# Patient Record
Sex: Female | Born: 1935 | Race: White | Hispanic: No | Smoking: Never smoker
Health system: Southern US, Community
[De-identification: ages and names within clinical notes are randomized; demographics above are authoritative.]

## PROBLEM LIST (undated history)

## (undated) DIAGNOSIS — E119 Type 2 diabetes mellitus without complications: Secondary | ICD-10-CM

## (undated) DIAGNOSIS — E079 Disorder of thyroid, unspecified: Secondary | ICD-10-CM

## (undated) DIAGNOSIS — I4891 Unspecified atrial fibrillation: Secondary | ICD-10-CM

## (undated) DIAGNOSIS — I10 Essential (primary) hypertension: Secondary | ICD-10-CM

## (undated) DIAGNOSIS — M1990 Unspecified osteoarthritis, unspecified site: Secondary | ICD-10-CM

## (undated) DIAGNOSIS — K449 Diaphragmatic hernia without obstruction or gangrene: Secondary | ICD-10-CM

## (undated) DIAGNOSIS — K219 Gastro-esophageal reflux disease without esophagitis: Secondary | ICD-10-CM

## (undated) DIAGNOSIS — I509 Heart failure, unspecified: Secondary | ICD-10-CM

## (undated) DIAGNOSIS — C4491 Basal cell carcinoma of skin, unspecified: Secondary | ICD-10-CM

## (undated) DIAGNOSIS — G629 Polyneuropathy, unspecified: Secondary | ICD-10-CM

## (undated) HISTORY — DX: Essential (primary) hypertension: I10

## (undated) HISTORY — DX: Gastro-esophageal reflux disease without esophagitis: K21.9

## (undated) HISTORY — DX: Heart failure, unspecified: I50.9

## (undated) HISTORY — PX: APPENDECTOMY: SHX54

## (undated) HISTORY — DX: Unspecified osteoarthritis, unspecified site: M19.90

## (undated) HISTORY — DX: Type 2 diabetes mellitus without complications: E11.9

## (undated) HISTORY — PX: EYE SURGERY: SHX253

## (undated) HISTORY — DX: Unspecified atrial fibrillation: I48.91

## (undated) HISTORY — PX: ABDOMINAL HYSTERECTOMY: SHX81

## (undated) HISTORY — DX: Diaphragmatic hernia without obstruction or gangrene: K44.9

## (undated) HISTORY — PX: OTHER SURGICAL HISTORY: SHX169

## (undated) HISTORY — PX: BREAST SURGERY: SHX581

## (undated) HISTORY — PX: KNEE SURGERY: SHX244

## (undated) HISTORY — DX: Disorder of thyroid, unspecified: E07.9

---

## 1998-04-08 ENCOUNTER — Ambulatory Visit (HOSPITAL_COMMUNITY): Admission: RE | Admit: 1998-04-08 | Discharge: 1998-04-08 | Payer: Self-pay | Admitting: Cardiovascular Disease

## 2004-11-14 ENCOUNTER — Inpatient Hospital Stay: Payer: Self-pay | Admitting: Internal Medicine

## 2004-11-24 ENCOUNTER — Ambulatory Visit: Payer: Self-pay | Admitting: Internal Medicine

## 2004-11-25 ENCOUNTER — Ambulatory Visit: Payer: Self-pay | Admitting: Internal Medicine

## 2004-12-03 ENCOUNTER — Ambulatory Visit: Payer: Self-pay | Admitting: Internal Medicine

## 2004-12-08 ENCOUNTER — Ambulatory Visit: Payer: Self-pay | Admitting: Internal Medicine

## 2004-12-10 ENCOUNTER — Ambulatory Visit: Payer: Self-pay | Admitting: Internal Medicine

## 2005-04-28 ENCOUNTER — Ambulatory Visit: Payer: Self-pay | Admitting: Family Medicine

## 2005-07-15 ENCOUNTER — Ambulatory Visit: Payer: Self-pay | Admitting: Family Medicine

## 2005-08-07 ENCOUNTER — Ambulatory Visit: Payer: Self-pay | Admitting: Otolaryngology

## 2005-08-24 ENCOUNTER — Ambulatory Visit: Payer: Self-pay | Admitting: Otolaryngology

## 2005-10-28 ENCOUNTER — Ambulatory Visit: Payer: Self-pay | Admitting: Otolaryngology

## 2005-11-25 ENCOUNTER — Ambulatory Visit: Payer: Self-pay | Admitting: Otolaryngology

## 2006-01-19 IMAGING — CR DG CHEST 1V PORT
1 series · 1 of 1 positions shown · non-contrast
Comparison: none

REASON FOR EXAM: R/O pneumonia
COMMENTS:

[view not recorded]
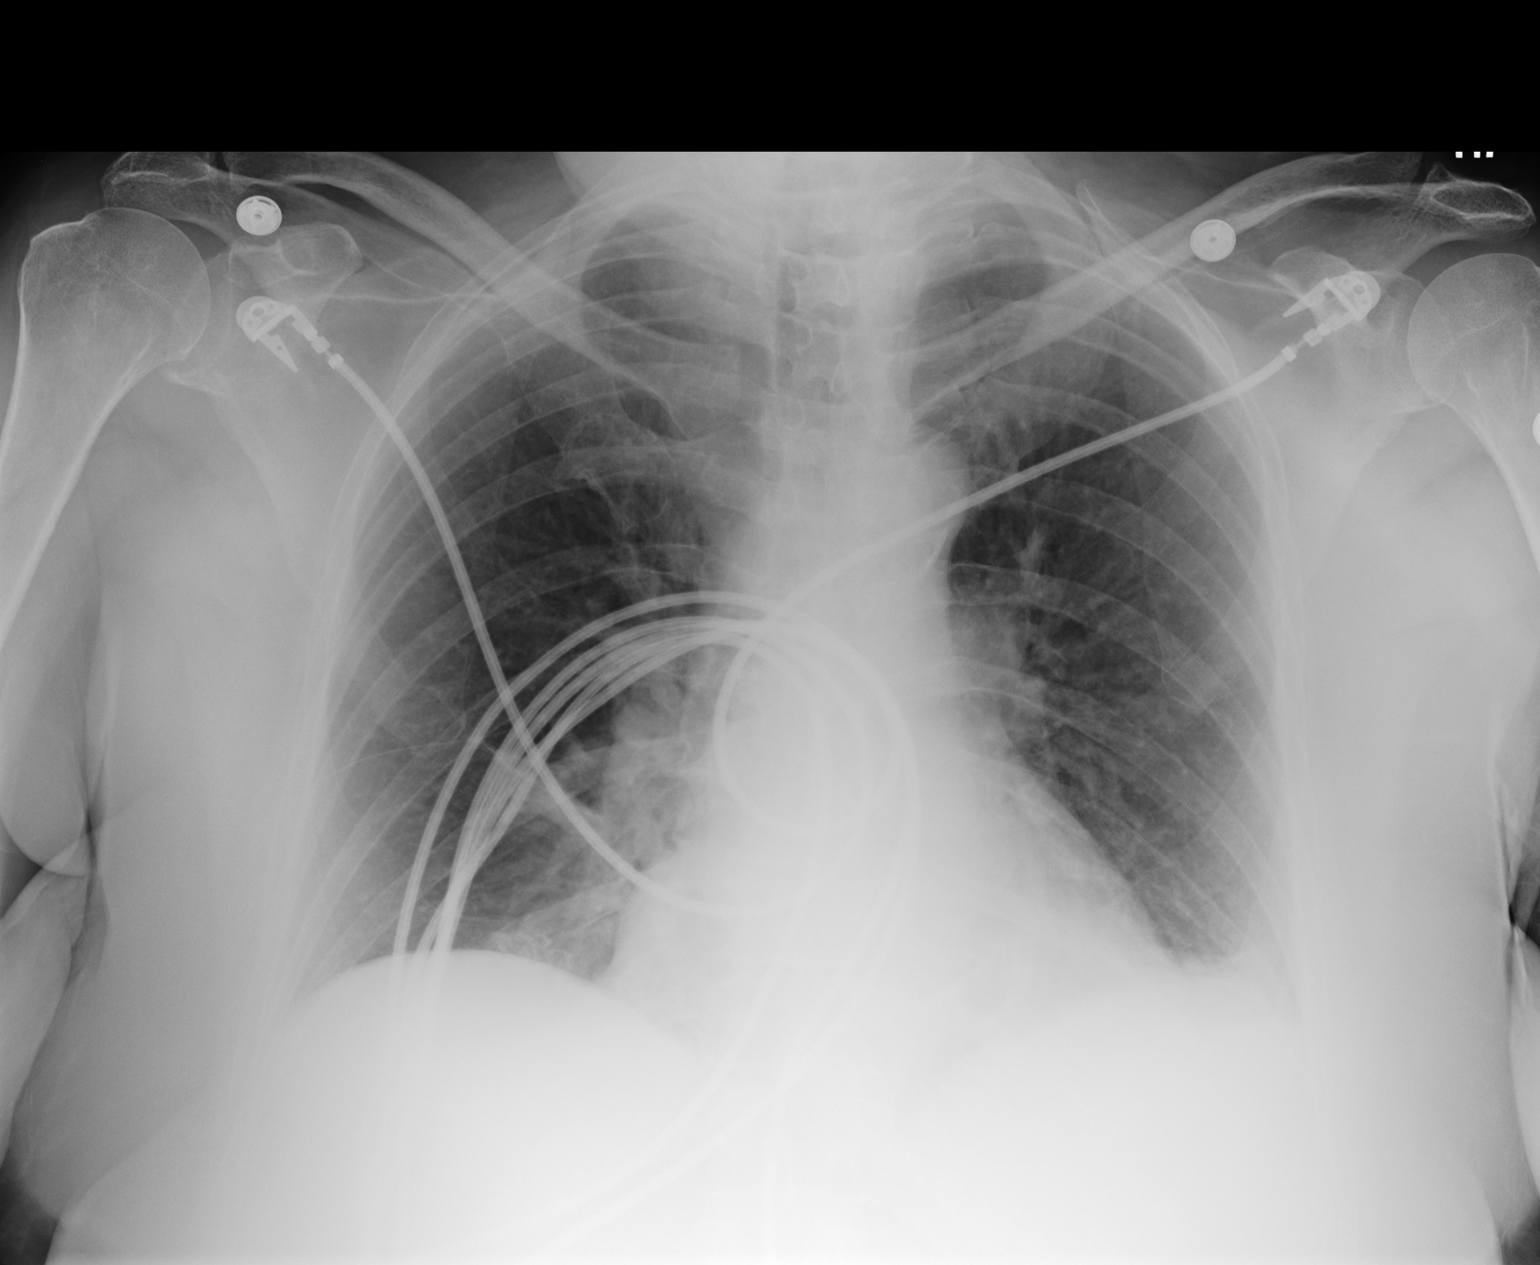

[1 of 1 positions shown; findings below may reference images not displayed]

PROCEDURE:     DXR - DXR PORTABLE CHEST SINGLE VIEW  - November 19, 2004  [DATE]

RESULT:       Portable chest shows underlying COPD with interstitial
changes.  When compared to the prior study from 11/15/04 there has been
development of a LEFT pleural effusion. I would not be surprised if there is
associated atelectasis.  The trachea is midline.  The bony structures show
degenerative change.
IMPRESSION: 1.     COPD with interstitial changes.
2.     No clear evidence of a pneumonia.
3.     New since the prior study of 11/15/04 has been development of a LEFT
pleural effusion.  I suspect there is associated atelectasis.
4.     Extensive degenerative bony changes.

## 2006-04-04 ENCOUNTER — Inpatient Hospital Stay: Payer: Self-pay | Admitting: Cardiovascular Disease

## 2006-04-04 ENCOUNTER — Other Ambulatory Visit: Payer: Self-pay

## 2006-05-04 ENCOUNTER — Ambulatory Visit: Payer: Self-pay | Admitting: Unknown Physician Specialty

## 2006-05-04 LAB — HM COLONOSCOPY

## 2006-06-03 ENCOUNTER — Ambulatory Visit: Payer: Self-pay | Admitting: Family Medicine

## 2006-12-14 ENCOUNTER — Ambulatory Visit: Payer: Self-pay | Admitting: Ophthalmology

## 2006-12-21 ENCOUNTER — Ambulatory Visit: Payer: Self-pay | Admitting: Ophthalmology

## 2007-03-26 ENCOUNTER — Other Ambulatory Visit: Payer: Self-pay

## 2007-03-26 ENCOUNTER — Inpatient Hospital Stay: Payer: Self-pay | Admitting: Internal Medicine

## 2007-03-29 ENCOUNTER — Other Ambulatory Visit: Payer: Self-pay

## 2007-03-31 ENCOUNTER — Other Ambulatory Visit: Payer: Self-pay

## 2007-04-01 ENCOUNTER — Other Ambulatory Visit: Payer: Self-pay

## 2007-06-14 ENCOUNTER — Ambulatory Visit: Payer: Self-pay | Admitting: Family Medicine

## 2007-06-29 ENCOUNTER — Other Ambulatory Visit: Payer: Self-pay

## 2007-06-29 ENCOUNTER — Inpatient Hospital Stay: Payer: Self-pay | Admitting: Internal Medicine

## 2007-06-30 ENCOUNTER — Other Ambulatory Visit: Payer: Self-pay

## 2007-07-01 ENCOUNTER — Other Ambulatory Visit: Payer: Self-pay

## 2007-12-15 ENCOUNTER — Ambulatory Visit: Payer: Self-pay | Admitting: Family Medicine

## 2007-12-28 ENCOUNTER — Ambulatory Visit: Payer: Self-pay | Admitting: Family Medicine

## 2007-12-30 ENCOUNTER — Inpatient Hospital Stay: Payer: Self-pay | Admitting: Internal Medicine

## 2007-12-30 ENCOUNTER — Other Ambulatory Visit: Payer: Self-pay

## 2008-06-19 ENCOUNTER — Ambulatory Visit: Payer: Self-pay | Admitting: Family Medicine

## 2009-03-04 ENCOUNTER — Ambulatory Visit: Payer: Self-pay | Admitting: Family Medicine

## 2009-03-04 ENCOUNTER — Encounter: Payer: Self-pay | Admitting: Gastroenterology

## 2009-03-13 ENCOUNTER — Encounter: Payer: Self-pay | Admitting: Gastroenterology

## 2009-03-13 ENCOUNTER — Ambulatory Visit: Payer: Self-pay | Admitting: Family Medicine

## 2009-03-14 ENCOUNTER — Ambulatory Visit: Payer: Self-pay | Admitting: Family Medicine

## 2009-03-14 ENCOUNTER — Encounter: Payer: Self-pay | Admitting: Gastroenterology

## 2009-03-20 ENCOUNTER — Encounter: Payer: Self-pay | Admitting: Gastroenterology

## 2009-03-27 ENCOUNTER — Encounter: Payer: Self-pay | Admitting: Gastroenterology

## 2009-04-18 ENCOUNTER — Ambulatory Visit: Payer: Self-pay | Admitting: Unknown Physician Specialty

## 2009-04-24 ENCOUNTER — Telehealth (INDEPENDENT_AMBULATORY_CARE_PROVIDER_SITE_OTHER): Payer: Self-pay | Admitting: *Deleted

## 2009-04-24 ENCOUNTER — Encounter: Payer: Self-pay | Admitting: Gastroenterology

## 2009-04-24 DIAGNOSIS — R933 Abnormal findings on diagnostic imaging of other parts of digestive tract: Secondary | ICD-10-CM | POA: Insufficient documentation

## 2009-05-04 ENCOUNTER — Inpatient Hospital Stay: Payer: Self-pay | Admitting: Internal Medicine

## 2009-05-04 ENCOUNTER — Encounter: Payer: Self-pay | Admitting: Gastroenterology

## 2009-05-05 ENCOUNTER — Encounter: Payer: Self-pay | Admitting: Gastroenterology

## 2009-05-08 ENCOUNTER — Encounter: Payer: Self-pay | Admitting: Gastroenterology

## 2009-05-08 ENCOUNTER — Telehealth: Payer: Self-pay | Admitting: Gastroenterology

## 2009-05-09 ENCOUNTER — Encounter: Payer: Self-pay | Admitting: Gastroenterology

## 2009-05-10 ENCOUNTER — Ambulatory Visit: Payer: Self-pay | Admitting: Oncology

## 2009-05-16 ENCOUNTER — Encounter: Payer: Self-pay | Admitting: Gastroenterology

## 2009-05-16 ENCOUNTER — Telehealth (INDEPENDENT_AMBULATORY_CARE_PROVIDER_SITE_OTHER): Payer: Self-pay | Admitting: *Deleted

## 2009-05-16 DIAGNOSIS — R1900 Intra-abdominal and pelvic swelling, mass and lump, unspecified site: Secondary | ICD-10-CM | POA: Insufficient documentation

## 2009-05-30 ENCOUNTER — Ambulatory Visit (HOSPITAL_COMMUNITY): Admission: RE | Admit: 2009-05-30 | Discharge: 2009-05-30 | Payer: Self-pay | Admitting: Gastroenterology

## 2009-05-30 ENCOUNTER — Encounter: Payer: Self-pay | Admitting: Gastroenterology

## 2009-05-30 ENCOUNTER — Ambulatory Visit: Payer: Self-pay | Admitting: Gastroenterology

## 2009-06-05 ENCOUNTER — Ambulatory Visit: Payer: Self-pay | Admitting: Oncology

## 2009-06-10 ENCOUNTER — Ambulatory Visit: Payer: Self-pay | Admitting: Oncology

## 2009-06-17 ENCOUNTER — Ambulatory Visit: Payer: Self-pay | Admitting: General Surgery

## 2009-06-19 ENCOUNTER — Encounter: Payer: Self-pay | Admitting: Gastroenterology

## 2009-06-19 ENCOUNTER — Inpatient Hospital Stay: Payer: Self-pay | Admitting: General Surgery

## 2009-06-19 HISTORY — PX: REMOVAL OF GASTROINTESTINAL STOMATIC  TUMOR OF STOMACH: SHX6339

## 2009-07-10 ENCOUNTER — Ambulatory Visit: Payer: Self-pay | Admitting: Oncology

## 2009-07-24 ENCOUNTER — Emergency Department: Payer: Self-pay | Admitting: Unknown Physician Specialty

## 2009-08-10 ENCOUNTER — Ambulatory Visit: Payer: Self-pay | Admitting: Oncology

## 2009-09-04 ENCOUNTER — Ambulatory Visit: Payer: Self-pay | Admitting: Oncology

## 2009-09-10 ENCOUNTER — Ambulatory Visit: Payer: Self-pay | Admitting: Oncology

## 2009-11-25 ENCOUNTER — Ambulatory Visit: Payer: Self-pay | Admitting: Family Medicine

## 2010-02-07 ENCOUNTER — Ambulatory Visit: Payer: Self-pay | Admitting: Oncology

## 2010-03-03 ENCOUNTER — Ambulatory Visit: Payer: Self-pay | Admitting: Oncology

## 2010-03-05 ENCOUNTER — Ambulatory Visit: Payer: Self-pay | Admitting: Oncology

## 2010-03-10 ENCOUNTER — Ambulatory Visit: Payer: Self-pay | Admitting: Oncology

## 2010-06-10 ENCOUNTER — Ambulatory Visit: Payer: Self-pay | Admitting: Oncology

## 2010-07-10 ENCOUNTER — Ambulatory Visit: Payer: Self-pay | Admitting: Oncology

## 2010-08-10 ENCOUNTER — Ambulatory Visit: Payer: Self-pay | Admitting: Oncology

## 2010-09-11 NOTE — Procedures (Signed)
Summary: Endoscopic Ultrasound  Patient: Dawn Burnett Note: All result statuses are Final unless otherwise noted.  Tests: (1) Endoscopic Ultrasound (EUS)  EUS Endoscopic Ultrasound                             DONE     [facility]     [address] [place], [place]  [address]           ENDOSCOPIC ULTRASOUND PROCEDURE REPORT           PATIENT:  Dawn, Burnett  MR#:  [id number]     BIRTHDATE:  [birth year]  GENDER:  female           ENDOSCOPIST:  Rachael Fee, MD     REFERRED BY:  Lynnae Prude, M.D.           PROCEDURE DATE:  05/30/2009     PROCEDURE:  Upper EUS w/FNA     ASA CLASS:  Class II     INDICATIONS:  large perigastric/gastric mass.noted first on CT     scan 2009, probably no change in size based on CT August 2010.  No     weight loss           MEDICATIONS:   Fentanyl 100 mcg IV, Versed 10 mg IV           DESCRIPTION OF PROCEDURE:   After the risks benefits and     alternatives of the procedure were  explained, informed consent     was obtained. The patient was then placed in the left, lateral,     decubitus postion and IV sedation was administered. Throughout the     procedure, the patient's blood pressure, pulse and oxygen     saturations were monitored continuously.  Under direct     visualization, the [id number] ([id number]) and [id number] ([id number])     endoscope was introduced through the mouth and advanced to the     duodenum .  Water was used as necessary to provide an acoustic     interface.  Upon completion of the imaging, water was removed and     the patient was sent to the recovery room in satisfactory     condition.           Endoscopic findings (limited views with radial and linear     echoendoscopes):     1. Normal esophagus     2. Normal stomach     3. Normal duodenum           EUS findings:     1. Large 7.2cm heterogeneous, well circumscribed mass that     directly abuts the posterior stomach and appears to communicate     with the  muscularis propria layer of the gastric wall. The mass is     very heterogeneous and has a few anechoic areas that are probably     necrotic.  The mass was sampled with 2 passes of a 19 gauge Echo     TIP EUS FNA needle, using color Doppler to avoid signficant     vessels.     2. CBD normal; non-dilated and without stones     3. Main pancreatic duct normal     4. Gallbladder normal     5. 5mm, simple appearing cyst in pancreatic body. There were no     nodules, nearby pancreatic masses and the cyst was not  sampled.     6. No perigastric adenopathy.     7. Limited views of liver, spleen, portal and splenic vessels were     all normal.           Impression:     7.2cm by 6.6cm heterogenous, well circumscribed mass that appears     to communicate with muscularis priopria wall of stomach.     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 I spoke with Dr.     Earnest Conroy office and they will be arrangeing surgical referral to     consider resection.  I will relay final pathology to patient and     Dr. Earnest Conroy office when it is available.           ______________________________     Rachael Fee, MD           n.     eSIGNED:   Rachael Fee at 05/30/2009 12:16 PM           Gayleen Orem, [id number]  Note: An exclamation mark (!) indicates a result that was not dispersed into the flowsheet. Document Creation Date: 05/30/2009 12:15 PM _______________________________________________________________________  (1) Order result status: Final Collection or observation date-time: 05/30/2009 12:00 Requested date-time:  Receipt date-time:  Reported date-time:  Referring Physician:   Ordering Physician: Rob Bunting [id number]) Specimen Source:  Source: Launa Grill Order Number: [id number] Lab site:

## 2010-09-11 NOTE — Letter (Signed)
[facility]   Im[facility] By: Lester [place] 05/23/2009 10:49:54  _____________________________________________________________________  External Attachment:    Type:   Image     Comment:   External Document

## 2010-09-11 NOTE — Letter (Signed)
Summary: [facility]   Imported By: Sherian Rein 04/25/2009 10:12:39  _____________________________________________________________________  External Attachment:    Type:   Image     Comment:   External Document

## 2010-09-11 NOTE — Op Note (Signed)
Summary: G[facility]  [facility]   Imported By: Lester [place] 07/02/2009 07:26:34  _____________________________________________________________________  External Attachment:    Type:   Image     Comment:   External Document

## 2010-09-11 NOTE — Progress Notes (Signed)
Summary: EUS  Phone Note Outgoing Call Call back at [facility] Phone [phone]   Call placed by: Chales Abrahams CMA Duncan Dull),  May 16, 2009 10:13 AM Summary of Call: pt needs EUS appt review meds.  Follow-up for Phone Call        pt aware will mail instructions.  will call with any questions Follow-up by: Chales Abrahams CMA (AAMA),  May 16, 2009 10:20 AM  New Problems: ABDOMINAL MASS (ICD-789.30)   New Problems: ABDOMINAL MASS (ICD-789.30)

## 2010-09-11 NOTE — Procedures (Signed)
Summary: [facility]O prep[facility]  [facility]   Imported By: Lester [place] 05/23/2009 10:38:42  _____________________________________________________________________  External Attachment:    Type:   Image     Comment:   External Document

## 2010-09-11 NOTE — Procedures (Signed)
Summary: Instructions for procedure/[facility] (out pt)  Instructions for procedure/[facility] (out pt)   Imported By: Sherian Rein 04/25/2009 10:06:09  _____________________________________________________________________  External Attachment:    Type:   Image     Comment:   External Document

## 2010-09-11 NOTE — Progress Notes (Signed)
Summary: Rescheduled ZE6 tomorrow   Phone Note Call from Patient   Call For: Dr Christella Hartigan Summary of Call: Is admitted at [facility] regional- Unable to keep appoinment tomorrow for ZE6 11:15am. Would like to reschedule for next week sometime. Will call when she is discharged from hospital- she believes tomorrow she will be discharged. Initial call taken by: Leanor Kail [facility],  May 08, 2009 12:45 PM  Follow-up for Phone Call        Dr Christella Hartigan do you want me to reschedule or wait and get records for you to review?  Cx with Endo. Follow-up by: Chales Abrahams CMA Duncan Dull),  May 08, 2009 1:04 PM  Additional Follow-up for Phone Call Additional follow up Details #1::        wait to get her [facility] discharge note, find out what happened there before rescheduling procedure. Additional Follow-up by: Rachael Fee MD,  May 08, 2009 8:25 PM    Additional Follow-up for Phone Call Additional follow up Details #2::    - Converted from flag ---- ---- 05/09/2009 12:31 PM, Leanor Kail [facility] wrote: Randell Loop will be faxing the discharge sheet to you. She is going home today if you can call pt directly later on or tomorrow with her appt. Also wants Dr to be aware she is on coumadin and will need instructions on when to stop taking it. Follow-up by: Chales Abrahams CMA Duncan Dull),  May 09, 2009 1:00 PM  Additional Follow-up for Phone Call Additional follow up Details #3:: Details for Additional Follow-up Action Taken: Called and asked pt to have her records sent to [place] from [place].  We have not recieved those yet she will call them today.  Records released faxed to [place]. Chales Abrahams CMA Duncan Dull)  May 14, 2009 4:44 PM  records recieved will be put on Dr Christella Hartigan desk for review. Chales Abrahams CMA (AAMA)  May 15, 2009 8:25 AM

## 2010-09-11 NOTE — Progress Notes (Signed)
Summary: EUS appt  Phone Note Outgoing Call Call back at [facility] Phone [phone]   Call placed by: Chales Abrahams CMA Duncan Dull),  April 24, 2009 10:25 AM Summary of Call: pt needs EUS appt and info review meds.    Follow-up for Phone Call        spoke with pt she is aware of the appt date and time and instructions.  She will call with further questions after recieving in the mail. Follow-up by: Chales Abrahams CMA (AAMA),  April 24, 2009 10:30 AM  New Problems: NONSPECIFIC ABN FINDING RAD & OTH EXAM GI TRACT (ICD-793.4)   New Problems: NONSPECIFIC ABN FINDING RAD & OTH EXAM GI TRACT (ICD-793.4)

## 2010-12-08 ENCOUNTER — Ambulatory Visit: Payer: Self-pay | Admitting: Oncology

## 2010-12-09 ENCOUNTER — Ambulatory Visit: Payer: Self-pay | Admitting: Oncology

## 2010-12-10 ENCOUNTER — Ambulatory Visit: Payer: Self-pay | Admitting: Family Medicine

## 2010-12-16 ENCOUNTER — Ambulatory Visit: Payer: Self-pay | Admitting: Oncology

## 2010-12-18 ENCOUNTER — Ambulatory Visit: Payer: Self-pay | Admitting: Oncology

## 2011-01-09 ENCOUNTER — Ambulatory Visit: Payer: Self-pay | Admitting: Oncology

## 2011-01-18 ENCOUNTER — Emergency Department: Payer: Self-pay | Admitting: Internal Medicine

## 2011-05-12 ENCOUNTER — Other Ambulatory Visit: Payer: Self-pay | Admitting: Cardiovascular Disease

## 2011-05-13 ENCOUNTER — Ambulatory Visit: Payer: Self-pay | Admitting: Cardiovascular Disease

## 2011-05-21 ENCOUNTER — Ambulatory Visit: Payer: Self-pay | Admitting: Oncology

## 2011-06-05 ENCOUNTER — Ambulatory Visit: Payer: Self-pay | Admitting: Orthopedic Surgery

## 2011-06-11 ENCOUNTER — Ambulatory Visit: Payer: Self-pay | Admitting: Oncology

## 2011-06-16 DIAGNOSIS — C44320 Squamous cell carcinoma of skin of unspecified parts of face: Secondary | ICD-10-CM | POA: Insufficient documentation

## 2011-11-17 ENCOUNTER — Ambulatory Visit: Payer: Self-pay | Admitting: Oncology

## 2011-11-19 ENCOUNTER — Ambulatory Visit: Payer: Self-pay | Admitting: Oncology

## 2011-11-19 LAB — COMPREHENSIVE METABOLIC PANEL
Albumin: 3.8 g/dL (ref 3.4–5.0)
Alkaline Phosphatase: 54 U/L (ref 50–136)
Anion Gap: 7 (ref 7–16)
BUN: 24 mg/dL — ABNORMAL HIGH (ref 7–18)
Bilirubin,Total: 0.5 mg/dL (ref 0.2–1.0)
Calcium, Total: 9 mg/dL (ref 8.5–10.1)
Chloride: 105 mmol/L (ref 98–107)
Co2: 30 mmol/L (ref 21–32)
Creatinine: 1.44 mg/dL — ABNORMAL HIGH (ref 0.60–1.30)
EGFR (African American): 40 — ABNORMAL LOW
EGFR (Non-African Amer.): 34 — ABNORMAL LOW
Glucose: 106 mg/dL — ABNORMAL HIGH (ref 65–99)
Osmolality: 288 (ref 275–301)
Potassium: 3.9 mmol/L (ref 3.5–5.1)
SGOT(AST): 20 U/L (ref 15–37)
SGPT (ALT): 25 U/L
Sodium: 142 mmol/L (ref 136–145)
Total Protein: 6.9 g/dL (ref 6.4–8.2)

## 2011-11-19 LAB — CBC CANCER CENTER
Basophil #: 0 x10 3/mm (ref 0.0–0.1)
Basophil %: 0.4 %
Eosinophil #: 0.1 x10 3/mm (ref 0.0–0.7)
Eosinophil %: 1 %
HCT: 37.2 % (ref 35.0–47.0)
HGB: 12.6 g/dL (ref 12.0–16.0)
Lymphocyte #: 2 x10 3/mm (ref 1.0–3.6)
Lymphocyte %: 33.3 %
MCH: 32.9 pg (ref 26.0–34.0)
MCHC: 33.9 g/dL (ref 32.0–36.0)
MCV: 97 fL (ref 80–100)
Monocyte #: 0.6 x10 3/mm (ref 0.2–0.9)
Monocyte %: 9.7 %
Neutrophil #: 3.4 x10 3/mm (ref 1.4–6.5)
Neutrophil %: 55.6 %
Platelet: 167 x10 3/mm (ref 150–440)
RBC: 3.84 10*6/uL (ref 3.80–5.20)
RDW: 15.3 % — ABNORMAL HIGH (ref 11.5–14.5)
WBC: 6.1 x10 3/mm (ref 3.6–11.0)

## 2011-12-09 ENCOUNTER — Ambulatory Visit: Payer: Self-pay | Admitting: Oncology

## 2011-12-25 ENCOUNTER — Ambulatory Visit: Payer: Self-pay | Admitting: Family Medicine

## 2012-03-29 ENCOUNTER — Ambulatory Visit: Payer: Self-pay | Admitting: Family Medicine

## 2012-06-01 DIAGNOSIS — N362 Urethral caruncle: Secondary | ICD-10-CM | POA: Insufficient documentation

## 2012-06-01 DIAGNOSIS — N3020 Other chronic cystitis without hematuria: Secondary | ICD-10-CM | POA: Insufficient documentation

## 2012-06-08 ENCOUNTER — Ambulatory Visit: Payer: Self-pay | Admitting: Oncology

## 2012-06-08 LAB — CBC CANCER CENTER
Basophil #: 0 x10 3/mm (ref 0.0–0.1)
Basophil %: 0.5 %
Eosinophil #: 0.1 x10 3/mm (ref 0.0–0.7)
Eosinophil %: 1.4 %
HCT: 42 % (ref 35.0–47.0)
HGB: 13.5 g/dL (ref 12.0–16.0)
Lymphocyte #: 2.6 x10 3/mm (ref 1.0–3.6)
Lymphocyte %: 37.2 %
MCH: 33 pg (ref 26.0–34.0)
MCHC: 32.2 g/dL (ref 32.0–36.0)
MCV: 103 fL — ABNORMAL HIGH (ref 80–100)
Monocyte #: 0.5 x10 3/mm (ref 0.2–0.9)
Monocyte %: 6.8 %
Neutrophil #: 3.7 x10 3/mm (ref 1.4–6.5)
Neutrophil %: 54.1 %
Platelet: 153 x10 3/mm (ref 150–440)
RBC: 4.1 10*6/uL (ref 3.80–5.20)
RDW: 14.5 % (ref 11.5–14.5)
WBC: 6.9 x10 3/mm (ref 3.6–11.0)

## 2012-06-08 LAB — COMPREHENSIVE METABOLIC PANEL
Albumin: 3.8 g/dL (ref 3.4–5.0)
Alkaline Phosphatase: 58 U/L (ref 50–136)
Anion Gap: 9 (ref 7–16)
BUN: 17 mg/dL (ref 7–18)
Bilirubin,Total: 0.6 mg/dL (ref 0.2–1.0)
Calcium, Total: 9.1 mg/dL (ref 8.5–10.1)
Chloride: 105 mmol/L (ref 98–107)
Co2: 30 mmol/L (ref 21–32)
Creatinine: 1.07 mg/dL (ref 0.60–1.30)
EGFR (African American): 56 — ABNORMAL LOW
EGFR (Non-African Amer.): 49 — ABNORMAL LOW
Glucose: 150 mg/dL — ABNORMAL HIGH (ref 65–99)
Osmolality: 291 (ref 275–301)
Potassium: 4.1 mmol/L (ref 3.5–5.1)
SGOT(AST): 20 U/L (ref 15–37)
SGPT (ALT): 29 U/L (ref 12–78)
Sodium: 144 mmol/L (ref 136–145)
Total Protein: 6.9 g/dL (ref 6.4–8.2)

## 2012-06-10 ENCOUNTER — Ambulatory Visit: Payer: Self-pay | Admitting: Oncology

## 2012-11-08 ENCOUNTER — Ambulatory Visit: Payer: Self-pay | Admitting: Oncology

## 2012-12-08 ENCOUNTER — Ambulatory Visit: Payer: Self-pay | Admitting: Oncology

## 2012-12-14 LAB — COMPREHENSIVE METABOLIC PANEL
Albumin: 3.5 g/dL (ref 3.4–5.0)
Alkaline Phosphatase: 57 U/L (ref 50–136)
Anion Gap: 11 (ref 7–16)
BUN: 20 mg/dL — ABNORMAL HIGH (ref 7–18)
Bilirubin,Total: 0.6 mg/dL (ref 0.2–1.0)
Calcium, Total: 8.8 mg/dL (ref 8.5–10.1)
Chloride: 103 mmol/L (ref 98–107)
Co2: 28 mmol/L (ref 21–32)
Creatinine: 1.27 mg/dL (ref 0.60–1.30)
EGFR (African American): 46 — ABNORMAL LOW
EGFR (Non-African Amer.): 39 — ABNORMAL LOW
Glucose: 183 mg/dL — ABNORMAL HIGH (ref 65–99)
Osmolality: 290 (ref 275–301)
Potassium: 4 mmol/L (ref 3.5–5.1)
SGOT(AST): 19 U/L (ref 15–37)
SGPT (ALT): 22 U/L (ref 12–78)
Sodium: 142 mmol/L (ref 136–145)
Total Protein: 6.7 g/dL (ref 6.4–8.2)

## 2012-12-14 LAB — CBC CANCER CENTER
Basophil #: 0 x10 3/mm (ref 0.0–0.1)
Basophil %: 0.6 %
Eosinophil #: 0.1 x10 3/mm (ref 0.0–0.7)
Eosinophil %: 1.3 %
HCT: 40 % (ref 35.0–47.0)
HGB: 13.5 g/dL (ref 12.0–16.0)
Lymphocyte #: 2 x10 3/mm (ref 1.0–3.6)
Lymphocyte %: 34.6 %
MCH: 32.9 pg (ref 26.0–34.0)
MCHC: 33.7 g/dL (ref 32.0–36.0)
MCV: 98 fL (ref 80–100)
Monocyte #: 0.5 x10 3/mm (ref 0.2–0.9)
Monocyte %: 8 %
Neutrophil #: 3.2 x10 3/mm (ref 1.4–6.5)
Neutrophil %: 55.5 %
Platelet: 152 x10 3/mm (ref 150–440)
RBC: 4.09 10*6/uL (ref 3.80–5.20)
RDW: 13.9 % (ref 11.5–14.5)
WBC: 5.8 x10 3/mm (ref 3.6–11.0)

## 2013-01-08 ENCOUNTER — Ambulatory Visit: Payer: Self-pay | Admitting: Oncology

## 2013-01-29 ENCOUNTER — Observation Stay: Payer: Self-pay | Admitting: Internal Medicine

## 2013-01-29 LAB — CBC
HCT: 42.6 % (ref 35.0–47.0)
HGB: 14.6 g/dL (ref 12.0–16.0)
MCH: 33.1 pg (ref 26.0–34.0)
MCHC: 34.3 g/dL (ref 32.0–36.0)
MCV: 97 fL (ref 80–100)
Platelet: 140 10*3/uL — ABNORMAL LOW (ref 150–440)
RBC: 4.41 10*6/uL (ref 3.80–5.20)
RDW: 13.8 % (ref 11.5–14.5)
WBC: 5.2 10*3/uL (ref 3.6–11.0)

## 2013-01-29 LAB — URINALYSIS, COMPLETE
Bacteria: NONE SEEN
Bilirubin,UR: NEGATIVE
Blood: NEGATIVE
Glucose,UR: 50 mg/dL (ref 0–75)
Ketone: NEGATIVE
Nitrite: NEGATIVE
Ph: 8 (ref 4.5–8.0)
Protein: NEGATIVE
RBC,UR: 4 /HPF (ref 0–5)
Specific Gravity: 1.011 (ref 1.003–1.030)
Squamous Epithelial: 1
WBC UR: 4 /HPF (ref 0–5)

## 2013-01-29 LAB — TROPONIN I
Troponin-I: <0.02 ng/mL
Troponin-I: <0.02 ng/mL

## 2013-01-29 LAB — COMPREHENSIVE METABOLIC PANEL
Albumin: 3.9 g/dL (ref 3.4–5.0)
Alkaline Phosphatase: 58 U/L (ref 50–136)
Anion Gap: 9 (ref 7–16)
BUN: 24 mg/dL — ABNORMAL HIGH (ref 7–18)
Bilirubin,Total: 0.5 mg/dL (ref 0.2–1.0)
Calcium, Total: 9.2 mg/dL (ref 8.5–10.1)
Chloride: 103 mmol/L (ref 98–107)
Co2: 29 mmol/L (ref 21–32)
Creatinine: 1.2 mg/dL (ref 0.60–1.30)
EGFR (African American): 49 — ABNORMAL LOW
EGFR (Non-African Amer.): 42 — ABNORMAL LOW
Glucose: 176 mg/dL — ABNORMAL HIGH (ref 65–99)
Osmolality: 290 (ref 275–301)
Potassium: 3.8 mmol/L (ref 3.5–5.1)
SGOT(AST): 21 U/L (ref 15–37)
SGPT (ALT): 26 U/L (ref 12–78)
Sodium: 141 mmol/L (ref 136–145)
Total Protein: 7.2 g/dL (ref 6.4–8.2)

## 2013-01-29 LAB — PROTIME-INR
INR: 2.3
Prothrombin Time: 24.9 secs — ABNORMAL HIGH (ref 11.5–14.7)

## 2013-01-30 LAB — CBC WITH DIFFERENTIAL/PLATELET
Basophil #: 0 10*3/uL (ref 0.0–0.1)
Basophil %: 0.6 %
Eosinophil #: 0.1 10*3/uL (ref 0.0–0.7)
Eosinophil %: 1.4 %
HCT: 45 % (ref 35.0–47.0)
HGB: 15.5 g/dL (ref 12.0–16.0)
Lymphocyte #: 2.9 10*3/uL (ref 1.0–3.6)
Lymphocyte %: 38.7 %
MCH: 33 pg (ref 26.0–34.0)
MCHC: 34.6 g/dL (ref 32.0–36.0)
MCV: 96 fL (ref 80–100)
Monocyte #: 0.6 x10 3/mm (ref 0.2–0.9)
Monocyte %: 7.5 %
Neutrophil #: 3.9 10*3/uL (ref 1.4–6.5)
Neutrophil %: 51.8 %
Platelet: 144 10*3/uL — ABNORMAL LOW (ref 150–440)
RBC: 4.7 10*6/uL (ref 3.80–5.20)
RDW: 14 % (ref 11.5–14.5)
WBC: 7.6 10*3/uL (ref 3.6–11.0)

## 2013-01-30 LAB — LIPID PANEL
Cholesterol: 319 mg/dL — ABNORMAL HIGH (ref 0–200)
HDL Cholesterol: 42 mg/dL (ref 40–60)
Triglycerides: 678 mg/dL — ABNORMAL HIGH (ref 0–200)

## 2013-01-30 LAB — TROPONIN I: Troponin-I: <0.02 ng/mL

## 2013-01-30 LAB — PROTIME-INR
INR: 2.1
Prothrombin Time: 23.2 secs — ABNORMAL HIGH (ref 11.5–14.7)

## 2013-03-12 ENCOUNTER — Emergency Department: Payer: Self-pay | Admitting: Emergency Medicine

## 2013-03-12 LAB — URINALYSIS, COMPLETE
Bilirubin,UR: NEGATIVE
Glucose,UR: NEGATIVE mg/dL (ref 0–75)
Ketone: NEGATIVE
Nitrite: NEGATIVE
Ph: 6 (ref 4.5–8.0)
Protein: NEGATIVE
RBC,UR: 11 /HPF (ref 0–5)
Specific Gravity: 1.012 (ref 1.003–1.030)
Squamous Epithelial: 2
WBC UR: 365 /HPF (ref 0–5)

## 2013-03-14 LAB — HM DEXA SCAN

## 2013-03-14 LAB — URINE CULTURE

## 2013-03-27 ENCOUNTER — Ambulatory Visit: Payer: Self-pay | Admitting: Family Medicine

## 2013-03-27 LAB — HM MAMMOGRAPHY: HM Mammogram: NORMAL

## 2013-06-14 LAB — HEPATIC FUNCTION PANEL
ALT: 12 U/L (ref 7–35)
AST: 21 U/L (ref 13–35)
Alkaline Phosphatase: 53 U/L (ref 25–125)
Bilirubin, Total: 0.7 mg/dL

## 2013-06-14 LAB — BASIC METABOLIC PANEL
BUN: 23 mg/dL — AB (ref 4–21)
Creatinine: 1 mg/dL (ref <=1.1)
Glucose: 133 mg/dL
Potassium: 4.4 mmol/L (ref 3.4–5.3)
Sodium: 146 mmol/L (ref 137–147)

## 2013-06-21 ENCOUNTER — Ambulatory Visit: Payer: Self-pay | Admitting: Oncology

## 2013-06-21 LAB — CBC CANCER CENTER
Basophil #: 0.1 x10 3/mm (ref 0.0–0.1)
Basophil %: 0.7 %
Eosinophil #: 0.1 x10 3/mm (ref 0.0–0.7)
Eosinophil %: 1.5 %
HCT: 41.3 % (ref 35.0–47.0)
HGB: 13.8 g/dL (ref 12.0–16.0)
Lymphocyte #: 2.5 x10 3/mm (ref 1.0–3.6)
Lymphocyte %: 34.6 %
MCH: 33.4 pg (ref 26.0–34.0)
MCHC: 33.3 g/dL (ref 32.0–36.0)
MCV: 100 fL (ref 80–100)
Monocyte #: 0.4 x10 3/mm (ref 0.2–0.9)
Monocyte %: 6 %
Neutrophil #: 4.1 x10 3/mm (ref 1.4–6.5)
Neutrophil %: 57.2 %
Platelet: 163 x10 3/mm (ref 150–440)
RBC: 4.12 10*6/uL (ref 3.80–5.20)
RDW: 13.7 % (ref 11.5–14.5)
WBC: 7.2 x10 3/mm (ref 3.6–11.0)

## 2013-06-21 LAB — COMPREHENSIVE METABOLIC PANEL
Albumin: 3.6 g/dL (ref 3.4–5.0)
Alkaline Phosphatase: 62 U/L (ref 50–136)
Anion Gap: 8 (ref 7–16)
BUN: 23 mg/dL — ABNORMAL HIGH (ref 7–18)
Bilirubin,Total: 0.7 mg/dL (ref 0.2–1.0)
Calcium, Total: 9.1 mg/dL (ref 8.5–10.1)
Chloride: 103 mmol/L (ref 98–107)
Co2: 32 mmol/L (ref 21–32)
Creatinine: 1.17 mg/dL (ref 0.60–1.30)
EGFR (African American): 50 — ABNORMAL LOW
EGFR (Non-African Amer.): 43 — ABNORMAL LOW
Glucose: 226 mg/dL — ABNORMAL HIGH (ref 65–99)
Osmolality: 296 (ref 275–301)
Potassium: 3.5 mmol/L (ref 3.5–5.1)
SGOT(AST): 22 U/L (ref 15–37)
SGPT (ALT): 25 U/L (ref 12–78)
Sodium: 143 mmol/L (ref 136–145)
Total Protein: 6.7 g/dL (ref 6.4–8.2)

## 2013-07-03 ENCOUNTER — Encounter: Payer: Self-pay | Admitting: Podiatry

## 2013-07-05 ENCOUNTER — Ambulatory Visit (INDEPENDENT_AMBULATORY_CARE_PROVIDER_SITE_OTHER): Payer: Medicare (Managed Care) | Admitting: Podiatry

## 2013-07-05 ENCOUNTER — Encounter: Payer: Self-pay | Admitting: Podiatry

## 2013-07-05 VITALS — BP 99/64 | HR 94 | Resp 16 | Ht 68.0 in | Wt 193.0 lb

## 2013-07-05 DIAGNOSIS — M79609 Pain in unspecified limb: Secondary | ICD-10-CM

## 2013-07-05 DIAGNOSIS — B351 Tinea unguium: Secondary | ICD-10-CM

## 2013-07-05 NOTE — Progress Notes (Signed)
Dawn Burnett presents today with a chief complaint of painful toenails one through 5 bilateral.  Objective: Nails are thick yellow dystrophic clinically mycotic 1 through 5 bilateral. Pulses remain palpable bilateral. Discoloration to the dorsum of the foot being treated by dermatology.  Assessment: Pain in limb secondary to onychomycosis 1 through 5 bilateral.  Plan: Debridement nails 1 through 5 bilateral covered service secondary to pain.

## 2013-07-10 ENCOUNTER — Ambulatory Visit: Payer: Self-pay | Admitting: Oncology

## 2013-10-11 ENCOUNTER — Ambulatory Visit: Payer: Medicare (Managed Care) | Admitting: Podiatry

## 2013-12-04 ENCOUNTER — Ambulatory Visit (INDEPENDENT_AMBULATORY_CARE_PROVIDER_SITE_OTHER): Payer: 59 | Admitting: Podiatry

## 2013-12-04 VITALS — Resp 16 | Ht 68.0 in | Wt 192.0 lb

## 2013-12-04 DIAGNOSIS — M79609 Pain in unspecified limb: Secondary | ICD-10-CM

## 2013-12-04 DIAGNOSIS — B351 Tinea unguium: Secondary | ICD-10-CM

## 2013-12-04 MED ORDER — DESOXIMETASONE 0.05 % EX CREA: TOPICAL_CREAM | Freq: Two times a day (BID) | CUTANEOUS | Status: DC

## 2013-12-04 NOTE — Progress Notes (Signed)
She presents today with a chief complaint of painful elongated toenails.  Objective: Pulses are palpable bilateral. Nails are thick yellow dystrophic with mycotic. She also has venous stasis dermatitis dorsal aspect of the bilateral foot.  Assessment: Pain in limb secondary to onychomycosis bilateral foot dermatitis bilateral foot.  Plan: Topicort and debridement of painful mycotic nails.Marland Kitchen

## 2013-12-11 LAB — LIPID PANEL
Cholesterol: 181 mg/dL (ref 0–200)
HDL: 43 mg/dL (ref 35–70)
LDL Cholesterol: 63 mg/dL
LDl/HDL Ratio: 1.5
Triglycerides: 376 mg/dL — AB (ref 40–160)

## 2013-12-11 LAB — HEMOGLOBIN A1C: Hgb A1c MFr Bld: 6.8 % — AB (ref 4.0–6.0)

## 2013-12-22 ENCOUNTER — Ambulatory Visit: Payer: Self-pay | Admitting: Oncology

## 2013-12-25 LAB — CBC CANCER CENTER
Basophil #: 0 x10 3/mm (ref 0.0–0.1)
Basophil %: 0.4 %
Eosinophil #: 0.1 x10 3/mm (ref 0.0–0.7)
Eosinophil %: 0.9 %
HCT: 41.4 % (ref 35.0–47.0)
HGB: 14.1 g/dL (ref 12.0–16.0)
Lymphocyte #: 2.6 x10 3/mm (ref 1.0–3.6)
Lymphocyte %: 29.1 %
MCH: 32.8 pg (ref 26.0–34.0)
MCHC: 34.1 g/dL (ref 32.0–36.0)
MCV: 96 fL (ref 80–100)
Monocyte #: 0.8 x10 3/mm (ref 0.2–0.9)
Monocyte %: 9.1 %
Neutrophil #: 5.4 x10 3/mm (ref 1.4–6.5)
Neutrophil %: 60.5 %
Platelet: 153 x10 3/mm (ref 150–440)
RBC: 4.3 10*6/uL (ref 3.80–5.20)
RDW: 14 % (ref 11.5–14.5)
WBC: 8.9 x10 3/mm (ref 3.6–11.0)

## 2013-12-25 LAB — COMPREHENSIVE METABOLIC PANEL
Albumin: 3.8 g/dL (ref 3.4–5.0)
Alkaline Phosphatase: 53 U/L
Anion Gap: 6 — ABNORMAL LOW (ref 7–16)
BUN: 29 mg/dL — ABNORMAL HIGH (ref 7–18)
Bilirubin,Total: 0.7 mg/dL (ref 0.2–1.0)
Calcium, Total: 8.8 mg/dL (ref 8.5–10.1)
Chloride: 104 mmol/L (ref 98–107)
Co2: 33 mmol/L — ABNORMAL HIGH (ref 21–32)
Creatinine: 1.14 mg/dL (ref 0.60–1.30)
EGFR (African American): 52 — ABNORMAL LOW
EGFR (Non-African Amer.): 44 — ABNORMAL LOW
Glucose: 133 mg/dL — ABNORMAL HIGH (ref 65–99)
Osmolality: 293 (ref 275–301)
Potassium: 4.1 mmol/L (ref 3.5–5.1)
SGOT(AST): 17 U/L (ref 15–37)
SGPT (ALT): 21 U/L (ref 12–78)
Sodium: 143 mmol/L (ref 136–145)
Total Protein: 6.8 g/dL (ref 6.4–8.2)

## 2014-01-08 ENCOUNTER — Ambulatory Visit: Payer: Self-pay | Admitting: Oncology

## 2014-01-31 ENCOUNTER — Ambulatory Visit (INDEPENDENT_AMBULATORY_CARE_PROVIDER_SITE_OTHER): Payer: 59 | Admitting: Podiatry

## 2014-01-31 VITALS — BP 106/55 | HR 86 | Resp 16

## 2014-01-31 DIAGNOSIS — Q828 Other specified congenital malformations of skin: Secondary | ICD-10-CM

## 2014-01-31 DIAGNOSIS — M79609 Pain in unspecified limb: Secondary | ICD-10-CM

## 2014-01-31 DIAGNOSIS — B351 Tinea unguium: Secondary | ICD-10-CM

## 2014-01-31 DIAGNOSIS — E119 Type 2 diabetes mellitus without complications: Secondary | ICD-10-CM

## 2014-01-31 DIAGNOSIS — M79676 Pain in unspecified toe(s): Secondary | ICD-10-CM

## 2014-01-31 NOTE — Progress Notes (Signed)
She presents today with a chief complaint of painful elongated toenails. She's also concerned about painful lesion sub-first metatarsal head of her left foot. She denies any trauma denies stepping on anything.  Objective: Vital signs are stable she is alert and oriented x3. Pulses are palpable bilateral. Nails are thick yellow dystrophic with mycotic and painful palpation. She also has a very small reactive hyperkeratotic lesion with a small amount of blood beneath. This is sub-first metatarsophalangeal joint of the left foot. Just beneath the tibial sesamoid. I debrided the area today there is no bleeding and I debrided that to normal tissue.  Assessment: Diabetes with pain in limb secondary to onychomycosis. And superficial porokeratotic lesion which was debrided today.  Plan: Debridement nails 1 through 5 bilateral covered service secondary to pain and diabetes debrided reactive hyperkeratosis pre-ulcerative lesion sub-first metatarsal head of the left foot

## 2014-03-05 ENCOUNTER — Ambulatory Visit: Payer: 59 | Admitting: Podiatry

## 2014-04-18 ENCOUNTER — Ambulatory Visit: Payer: Self-pay | Admitting: Family Medicine

## 2014-04-18 LAB — TSH: TSH: 1.13 u[IU]/mL (ref <=5.90)

## 2014-04-18 LAB — CBC AND DIFFERENTIAL
HCT: 38 % (ref 36–46)
Hemoglobin: 13.5 g/dL (ref 12.0–16.0)
Neutrophils Absolute: 62 /uL
Platelets: 165 10*3/uL (ref 150–399)
WBC: 7.3 10^3/mL

## 2014-04-18 LAB — HM DIABETES FOOT EXAM: HM Diabetic Foot Exam: DECREASED

## 2014-06-12 ENCOUNTER — Ambulatory Visit: Payer: Self-pay | Admitting: Ophthalmology

## 2014-06-12 LAB — POTASSIUM: Potassium: 3.7 mmol/L (ref 3.5–5.1)

## 2014-06-12 LAB — PROTIME-INR
INR: 2
Prothrombin Time: 21.8 secs — ABNORMAL HIGH (ref 11.5–14.7)

## 2014-06-18 ENCOUNTER — Ambulatory Visit (INDEPENDENT_AMBULATORY_CARE_PROVIDER_SITE_OTHER): Payer: 59 | Admitting: Podiatry

## 2014-06-18 DIAGNOSIS — B351 Tinea unguium: Secondary | ICD-10-CM

## 2014-06-18 DIAGNOSIS — M79676 Pain in unspecified toe(s): Secondary | ICD-10-CM

## 2014-06-18 NOTE — Progress Notes (Signed)
Presents today chief complaint of painful elongated toenails.  Objective: Pulses are palpable bilateral nails are thick, yellow dystrophic onychomycosis and painful palpation.   Assessment: Onychomycosis with pain in limb.  Plan: Treatment of nails in thickness and length as covered service secondary to pain.  

## 2014-06-21 ENCOUNTER — Ambulatory Visit: Payer: Self-pay | Admitting: Ophthalmology

## 2014-07-10 ENCOUNTER — Ambulatory Visit: Payer: Self-pay | Admitting: Oncology

## 2014-07-10 LAB — COMPREHENSIVE METABOLIC PANEL
Albumin: 3.7 g/dL (ref 3.4–5.0)
Alkaline Phosphatase: 60 U/L
Anion Gap: 9 (ref 7–16)
BUN: 18 mg/dL (ref 7–18)
Bilirubin,Total: 1 mg/dL (ref 0.2–1.0)
Calcium, Total: 9.5 mg/dL (ref 8.5–10.1)
Chloride: 102 mmol/L (ref 98–107)
Co2: 33 mmol/L — ABNORMAL HIGH (ref 21–32)
Creatinine: 1.07 mg/dL (ref 0.60–1.30)
EGFR (African American): >60
EGFR (Non-African Amer.): 52 — ABNORMAL LOW
Glucose: 134 mg/dL — ABNORMAL HIGH (ref 65–99)
Osmolality: 291 (ref 275–301)
Potassium: 4.3 mmol/L (ref 3.5–5.1)
SGOT(AST): 22 U/L (ref 15–37)
SGPT (ALT): 24 U/L
Sodium: 144 mmol/L (ref 136–145)
Total Protein: 6.8 g/dL (ref 6.4–8.2)

## 2014-07-10 LAB — CBC CANCER CENTER
Basophil #: 0 x10 3/mm (ref 0.0–0.1)
Basophil %: 0.6 %
Eosinophil #: 0.1 x10 3/mm (ref 0.0–0.7)
Eosinophil %: 1.5 %
HCT: 41.8 % (ref 35.0–47.0)
HGB: 14 g/dL (ref 12.0–16.0)
Lymphocyte #: 2.6 x10 3/mm (ref 1.0–3.6)
Lymphocyte %: 35.5 %
MCH: 32.9 pg (ref 26.0–34.0)
MCHC: 33.4 g/dL (ref 32.0–36.0)
MCV: 99 fL (ref 80–100)
Monocyte #: 0.5 x10 3/mm (ref 0.2–0.9)
Monocyte %: 7 %
Neutrophil #: 4.1 x10 3/mm (ref 1.4–6.5)
Neutrophil %: 55.4 %
Platelet: 171 x10 3/mm (ref 150–440)
RBC: 4.25 10*6/uL (ref 3.80–5.20)
RDW: 14.6 % — ABNORMAL HIGH (ref 11.5–14.5)
WBC: 7.4 x10 3/mm (ref 3.6–11.0)

## 2014-07-10 LAB — CREATININE, SERUM: Creatine, Serum: 1.07

## 2014-08-10 ENCOUNTER — Ambulatory Visit: Payer: Self-pay | Admitting: Oncology

## 2014-10-01 ENCOUNTER — Ambulatory Visit (INDEPENDENT_AMBULATORY_CARE_PROVIDER_SITE_OTHER): Payer: Medicare Other | Admitting: Podiatry

## 2014-10-01 ENCOUNTER — Ambulatory Visit: Payer: 59 | Admitting: Podiatry

## 2014-10-01 DIAGNOSIS — B351 Tinea unguium: Secondary | ICD-10-CM

## 2014-10-01 DIAGNOSIS — M79676 Pain in unspecified toe(s): Secondary | ICD-10-CM

## 2014-10-01 NOTE — Progress Notes (Signed)
Presents today chief complaint of painful elongated toenails.  Objective: Pulses are palpable bilateral nails are thick, yellow dystrophic onychomycosis and painful palpation.   Assessment: Onychomycosis with pain in limb.  Plan: Treatment of nails in thickness and length as covered service secondary to pain.  

## 2014-11-14 DIAGNOSIS — C44211 Basal cell carcinoma of skin of unspecified ear and external auricular canal: Secondary | ICD-10-CM | POA: Insufficient documentation

## 2014-11-23 ENCOUNTER — Other Ambulatory Visit: Payer: Self-pay | Admitting: Oncology

## 2014-11-23 DIAGNOSIS — C494 Malignant neoplasm of connective and soft tissue of abdomen: Secondary | ICD-10-CM

## 2014-11-24 DIAGNOSIS — I4891 Unspecified atrial fibrillation: Secondary | ICD-10-CM | POA: Insufficient documentation

## 2014-11-24 DIAGNOSIS — K449 Diaphragmatic hernia without obstruction or gangrene: Secondary | ICD-10-CM | POA: Insufficient documentation

## 2014-11-27 LAB — PROTIME-INR: Protime: 2.7 seconds — AB (ref 10.0–13.8)

## 2014-11-30 NOTE — Discharge Summary (Signed)
PATIENT NAME:  Dawn Burnett, Dawn Burnett MR#:  [id number] DATE OF BIRTH:  1929-05-31  DATE OF ADMISSION:  01/29/2013 DATE OF DISCHARGE:  01/30/2013  ADMISSION DIAGNOSIS: Possible transient ischemic attack.   DISCHARGE DIAGNOSES: 1.  Generalized weakness, does not seem to be a neurological event.  2.  Recent history of falls.  3.  History of atrial fibrillation.  4.  History of hypothyroidism.  5.  Hypertension.  6.  Hypertriglyceridemia   CONSULTS: None.   LABORATORIES AT DISCHARGE: White blood cells 7.6, hemoglobin 15, hematocrit 45, platelets 144, triglycerides 678, INR is 2.1. Troponin x 3 are negative.   CT of the head showed no acute intracranial hemorrhage or CVA to answer any defect.   HOSPITAL COURSE: An 81 year old female who presented with weakness, thought initially to have a transient ischemic attack. For further details, please refer to the H and P. 1.  Generalized weakness. The patient was complaining of weakness and from talking to her, there does not seem to be any acute focalities such as one side being weaker than the other. She was describing bilateral generalized weakness. She did not want the carotid Doppler. She had a recent echocardiogram just a few weeks ago prior to hospitalization. Her initial CT of the head was normal. She had no neurological events during her brief hospitalization. I assume this is generalized weakness and not clearly a transient ischemic attack or cerebrovascular accident. Physical therapy consult did see her and they recommended home without any home health care.  2.  Hypertension. The patient will continue her outpatient medications. Tribenzor for some reason was not on her initial medication list, but she is taking this at home.  3.  History of atrial fibrillation. Her heart rate was anywhere between 90 to 120. She was asymptomatic. She did receive one dose of diltiazem. Her heart rates are better controlled with this. She will  her continue outpatient  medication.  4.  Hypothyroidism. The patient will continue Synthroid.  5.  Hypertriglyceridemia. This should be deferred to  her outpatient physician.   DISCHARGE MEDICATIONS: 1.  Imipramine 25 mg at bedtime.  2.  Calcium 1 tablet daily.  3.  Centrum Silver 1 tablet daily.  4.  Celebrex 200 mg daily.  5.  Synthroid 50 mcg daily.  6.  Coumadin 4 mg Monday and Friday and 5 mg Tuesday, Wednesday, Thursday, Saturday and Sunday.   7.  Ranitidine 150 b.i.d.  8.  Neurontin 600 b.i.d.  9.  Omeprazole 20 mg b.i.d.  10.  Lasix 20 mg daily.  11.  Metoprolol 100 mg daily.  80. Tribenzor. She thinks she is 10/25/ 40, one tablet daily.   DISCHARGE DIET: Low sodium.   DISCHARGE ACTIVITY: As tolerated.  DISCHARGE FOLLOW-UP:  She will follow up with Dr. Rosanna Randy in 1 week.    TIME SPENT:  Approximately 35 minutes.    ____________________________ Donell Beers. Benjie Karvonen, MD spm:cc D: 01/30/2013 13:44:00 ET T: 01/30/2013 15:40:04 ET JOB#: [id number]  cc: Delfino Lovett L. Rosanna Randy, MD Gabe Glace P. Benjie Karvonen, MD, <Dictator>   Donell Beers Jatziry Wechter MD ELECTRONICALLY SIGNED 01/31/2013 11:32

## 2014-11-30 NOTE — H&P (Signed)
PATIENT NAME:  Dawn Burnett, Dawn Burnett MR#:  [id number] DATE OF BIRTH:  1928-11-13  DATE OF ADMISSION:  01/29/2013  PRIMARY CARE PHYSICIAN:  Dr. Miguel Aschoff   REQUESTING PHYSICIAN:  Dr. Lisa Roca  CHIEF COMPLAINT: Left-sided weakness.   HISTORY OF PRESENT ILLNESS: The patient is an 81 year old female with a known history of gastric stromal tumor (GIST), hypertension, atrial fibrillation, is being admitted for possible TIA. The patient has had about three falls in the last 6 months. She lives alone, uses cane and/or walker. She usually manages herself okay, but this morning when she woke up around 7:30, was going to the bathroom and she could not walk right. She felt her left side was giving out. She just felt that it is hard for her to describe what exactly happened, but it was not normal. She dragged her legs somewhat and reached the bathroom, did not fall. She felt that her leg was also feeling a little numb. She was brought into the Emergency Department by her daughter, as this was her second or third episode. While in the ED, she still had some paresthesia in the leg and hand on the left and is being admitted for possible TIA.   PAST MEDICAL HISTORY: 1.  Gastric stromal tumor.  2.  Atrial fibrillation, on Coumadin.  3.  GERD.  4.  History of CHF.  5.  Hypothyroidism.  6.  Hypertension.   PAST SURGICAL HISTORY:  1.  Right hip surgery.  2.  Right total knee replacement.  3.  Cataract extraction.  4.  Breast surgery.  5.  Foot surgery.  6.  Hysterectomy.  7.  Right knee surgery.   SOCIAL HISTORY: Never smoked. No alcohol. She lives alone and has son lives nearby and helps her. She uses 2 canes and a walker at times. She does not go outside the house much and prefers cane for walking.   FAMILY HISTORY: Father had stomach cancer. Mother's side had extensive heart disease.   ALLERGIES: AUGMENTIN, CODEINE, IODINATED CONTRAST, SULFA, TOLECTIN, TAPE.    MEDICATIONS AT HOME:  1.  Calcium  with vitamin D 1 tablet p.o. every evening.  2.  Celebrex 200 mg p.o. daily.  3.  Centrum Silver once daily.  4.  Coumadin 4 mg on Monday and Friday, 5 mg the rest of the days in the week.  5.  Imipramine 25 mg p.o. at bedtime.  6.  Lasix 20 mg p.o. daily.  7.  Metoprolol 100 mg p.o. daily.  8.  Neurontin 600 mg p.o. b.i.d.  9.  Omeprazole 20 mg p.o. b.i.d.  10.  Ranitidine 150 mg p.o. b.i.d.  11.  Synthroid 50 mcg p.o. daily.   REVIEW OF SYSTEMS:  CONSTITUTIONAL: No fever. Positive for fatigue and weakness. Reports about 4-pound weight loss in the last 1 month, as she does not have much appetite.  EYES: No blurry or double vision. She wears glasses, mainly for reading.   ENT: No tinnitus or ear pain. She does feel that she has decreased hearing.  RESPIRATORY: No cough, wheezing, hemoptysis.  CARDIOVASCULAR: No chest pain, orthopnea. Positive for edema, for which she wears TEDs all the time.  GASTROINTESTINAL: No nausea, vomiting. Positive for some loose stools all the time. No abdominal pain. Positive for GERD.  GENITOURINARY: No dysuria or hematuria.  ENDOCRINE: No polyuria or nocturia. Positive for hypothyroidism.  HEMATOLOGY: No anemia or easy bruising.  SKIN: No rash or lesion.  MUSCULOSKELETAL: Positive for arthritis and difficulty walking. NEUROLOGIC:  Positive for paresthesias on the left side and left-sided weakness,  also difficulty walking.  PSYCHIATRIC: No history of anxiety, depression.  PHYSICAL EXAMINATION: VITAL SIGNS: Temperature 97.5, heart rate 102 per minute, respirations 20 per minute, blood pressure 169/79 mmHg. She is saturating 94% on room air.   GENERAL:  The patient is an 81 year old female, lying in the bed comfortably, without any acute distress.  EYES: Pupils equal, round, reactive to light and accommodation. No scleral icterus. Extraocular muscles intact.  HENT: Head atraumatic, normocephalic. Oropharynx and nasopharynx clear.  NECK:  Supple. No jugular  venous distention. No thyromegaly. No tenderness.   LUNGS: Clear to auscultation bilaterally. No wheezing, rales, rhonchi or crepitation.  CARDIOVASCULAR: Irregularly irregular heart sounds. No murmurs, rubs or gallop.  ABDOMEN: Soft, nontender, nondistended. Bowel sounds present. No organomegaly or masses.  EXTREMITIES: No pedal edema, cyanosis, clubbing. She has TEDs on both lower extremities. Muscle strength 5/5. Extremity sensation intact.  PSYCHIATRIC: The patient is oriented to time, place and person x 3, and she is alert.  SKIN: No obvious rash, lesion, ulcer.  MUSCULOSKELETAL: No joint effusion.   LABORATORY, DIAGNOSTIC, AND RADIOLOGICAL DATA: Normal BMP. Normal liver function tests. Normal first set of troponins. Normal CBC. PT of 24.9, INR 2.3. Negative UA.   CT scan of the head without contrast in the ED showed no acute intracranial abnormality.   EKG showed atrial fibrillation with RVR with a rate of 104 per minute.  No major ST or T wave changes.    IMPRESSION AND PLAN: 1.  Transient ischemic attack with left right-sided paresthesia and weakness. We will get serial troponin, obtain carotid Dopplers. Will hold off rechecking echo as she has just had it in within about the last 2 to 3 weeks at cardiology office. There is no need to repeat CT scan of the head also, unless she had worsening of her symptoms, as per neurology, with whom I had a discussion (Dr. Irish Elders). Dr. Irish Elders also recommended to hold off adding any more blood thinner, as she is already on Coumadin and she is therapeutic, as this would increase her chances of bleeding, so will hold off aspirin. The patient refused statin, as she cannot tolerate it. We will check TSH and orthostatic vitals, and get PT, OT evaluation.   2.  Chronic atrial fibrillation on Coumadin, with therapeutic INR. Will continue Coumadin and monitor on telemetry. Check daily INR.  3.  Recurrent falls at least 3 times in the last 6 months. We will  get physical and occupational therapy for evaluation and management to evaluate her need for skilled therapy.  4.  Uncontrolled hypertension. Will resume home medication and adjust medication as needed to control blood pressure.  5.  Hypothyroidism. Will check TSH. Continue Synthroid.   CODE STATUS: FULL CODE.   TOTAL TIME SPENT:  Taking care of this patient was 55 minutes.    ____________________________ Lucina Mellow. Manuella Ghazi, MD vss:cc D: 01/29/2013 14:17:00 ET T: 01/29/2013 14:55:40 ET JOB#: [id number]  cc: Delfino Lovett L. Rosanna Randy, MD Martie Lee. Oliva Bustard, MD Dionisio David, MD Yarden Hillis S. Manuella Ghazi, MD, <Dictator>   Lucina Mellow [facility] MD ELECTRONICALLY SIGNED 02/01/2013 17:23

## 2014-12-01 NOTE — Op Note (Signed)
PATIENT NAME:  Dawn Burnett, Dawn Burnett MR#:  [id number] DATE OF BIRTH:  12-24-1928  DATE OF PROCEDURE:  06/21/2014  PREOPERATIVE DIAGNOSIS:  Nuclear sclerotic cataract, right eye, diagnosis code H25.11.  POSTOPERATIVE DIAGNOSIS:  Nuclear sclerotic cataract, right eye, diagnosis code H25.11.  PROCEDURE:  Phacoemulsification with posterior chamber intraocular lens right eye, model SN60WF, 19.0 diopters.   ANESTHESIA:  Topical.    SURGEON:  Lyla Glassing, MD  INDICATIONS:  This is an 81 year old female with decreased vision in the right eye.  PROCEDURE:  The risks and benefits of cataract surgery were discussed at length with the patient, including bleeding, infection, retinal detachment, re-operation, diplopia, ptosis, loss of vision, and loss of the eye. Informed consent was obtained. On the day of surgery, several sets of preoperative medication were administered to the operative eye including 0.5% tetracaine,1% cyclopentolate, 10% phenylephrine, 0.5% ketorolac, 0.5% gatifloxacin, and 2% lidocaine .  The patient was taken to the operating room and sedated via IV sedation. Topical tetracaine was placed in the eye. The operative eye was prepped using a 10% Betadine solution and then covered in sterile drapes leaving only the operative eye exposed. A Lieberman lid speculum was placed to provide exposure. Using 0.12 forceps and a sideport blade, a paracentesis was created. Then a mixture of BSS, preservative free lidocaine, and epinephrine was injected into the anterior chamber. Next, a 2.4 mm keratome blade was used to create a two-step full-thickness clear corneal incision temporally. The cystitome and Utrata forceps were used to create a continuous capsulorrhexis in the anterior lens capsule. BSS on a hydrodissection cannula was used to perform gentle hydrodissection. Phacoemulsification was then performed to remove the nucleus. Irrigation and aspiration was performed to remove the remaining cortical  material. Provisc was injected to fill the capsular bag and anterior chamber. A 19.0 diopter SN60WF intraocular lens was injected into the capsular bag. The Connor wand was used to rotate it into proper position in the capsular bag. Irrigation and aspiration was performed to remove the remaining Viscoelastic material from the eye. BSS on a 30-gauge cannula was used to hydrate the wound. An intracameral antibiotic was administered. The wounds were checked and found to be watertight. The lid speculum and drapes were carefully removed. Several drops of Vigamox were placed in the operative eye. The eye was covered with protective eyewear. The patient was taken to the recovery area in good condition. There were no complications.  ____________________________ Lyla Glassing, MD nm:bu D: 06/21/2014 13:20:04 ET T: 06/21/2014 16:41:43 ET JOB#: [id number]  cc: Lyla Glassing, MD, <Dictator> Lyla Glassing MD ELECTRONICALLY SIGNED 06/21/2014 19:46

## 2014-12-07 DIAGNOSIS — R159 Full incontinence of feces: Secondary | ICD-10-CM | POA: Insufficient documentation

## 2014-12-07 DIAGNOSIS — Z87898 Personal history of other specified conditions: Secondary | ICD-10-CM | POA: Insufficient documentation

## 2014-12-07 DIAGNOSIS — E669 Obesity, unspecified: Secondary | ICD-10-CM | POA: Insufficient documentation

## 2014-12-07 DIAGNOSIS — R739 Hyperglycemia, unspecified: Secondary | ICD-10-CM | POA: Insufficient documentation

## 2014-12-07 DIAGNOSIS — I10 Essential (primary) hypertension: Secondary | ICD-10-CM | POA: Insufficient documentation

## 2014-12-07 DIAGNOSIS — M8580 Other specified disorders of bone density and structure, unspecified site: Secondary | ICD-10-CM | POA: Insufficient documentation

## 2014-12-07 DIAGNOSIS — G629 Polyneuropathy, unspecified: Secondary | ICD-10-CM | POA: Insufficient documentation

## 2014-12-07 DIAGNOSIS — E039 Hypothyroidism, unspecified: Secondary | ICD-10-CM | POA: Insufficient documentation

## 2014-12-07 DIAGNOSIS — K219 Gastro-esophageal reflux disease without esophagitis: Secondary | ICD-10-CM | POA: Insufficient documentation

## 2014-12-07 DIAGNOSIS — R32 Unspecified urinary incontinence: Secondary | ICD-10-CM | POA: Insufficient documentation

## 2014-12-07 DIAGNOSIS — I4891 Unspecified atrial fibrillation: Secondary | ICD-10-CM | POA: Insufficient documentation

## 2014-12-07 DIAGNOSIS — M1990 Unspecified osteoarthritis, unspecified site: Secondary | ICD-10-CM | POA: Insufficient documentation

## 2014-12-07 DIAGNOSIS — I2510 Atherosclerotic heart disease of native coronary artery without angina pectoris: Secondary | ICD-10-CM | POA: Insufficient documentation

## 2014-12-07 DIAGNOSIS — E785 Hyperlipidemia, unspecified: Secondary | ICD-10-CM | POA: Insufficient documentation

## 2014-12-07 DIAGNOSIS — J309 Allergic rhinitis, unspecified: Secondary | ICD-10-CM | POA: Insufficient documentation

## 2014-12-14 ENCOUNTER — Other Ambulatory Visit: Payer: Self-pay | Admitting: *Deleted

## 2014-12-14 ENCOUNTER — Other Ambulatory Visit: Payer: Self-pay | Admitting: Family Medicine

## 2014-12-14 DIAGNOSIS — C49A0 Gastrointestinal stromal tumor, unspecified site: Secondary | ICD-10-CM

## 2014-12-14 DIAGNOSIS — C494 Malignant neoplasm of connective and soft tissue of abdomen: Secondary | ICD-10-CM

## 2014-12-17 ENCOUNTER — Inpatient Hospital Stay: Payer: Medicare Other | Attending: Oncology

## 2014-12-17 ENCOUNTER — Ambulatory Visit
Admission: RE | Admit: 2014-12-17 | Discharge: 2014-12-17 | Disposition: A | Discharge status: Home / Self Care | Payer: Medicare Other | Source: Ambulatory Visit | Attending: Oncology | Admitting: Oncology

## 2014-12-17 DIAGNOSIS — I10 Essential (primary) hypertension: Secondary | ICD-10-CM | POA: Diagnosis not present

## 2014-12-17 DIAGNOSIS — Z08 Encounter for follow-up examination after completed treatment for malignant neoplasm: Secondary | ICD-10-CM | POA: Insufficient documentation

## 2014-12-17 DIAGNOSIS — Z79899 Other long term (current) drug therapy: Secondary | ICD-10-CM | POA: Insufficient documentation

## 2014-12-17 DIAGNOSIS — C494 Malignant neoplasm of connective and soft tissue of abdomen: Secondary | ICD-10-CM | POA: Insufficient documentation

## 2014-12-17 DIAGNOSIS — C49A0 Gastrointestinal stromal tumor, unspecified site: Secondary | ICD-10-CM

## 2014-12-17 LAB — CBC WITH DIFFERENTIAL/PLATELET
Basophils Absolute: 0 10*3/uL (ref 0–0.1)
Basophils Relative: 0 %
Eosinophils Absolute: 0 10*3/uL (ref 0–0.7)
Eosinophils Relative: 0 %
HCT: 42 % (ref 35.0–47.0)
Hemoglobin: 14.3 g/dL (ref 12.0–16.0)
Lymphocytes Relative: 15 %
Lymphs Abs: 1.5 10*3/uL (ref 1.0–3.6)
MCH: 34 pg (ref 26.0–34.0)
MCHC: 34 g/dL (ref 32.0–36.0)
MCV: 100.2 fL — ABNORMAL HIGH (ref 80.0–100.0)
Monocytes Absolute: 0.1 10*3/uL — ABNORMAL LOW (ref 0.2–0.9)
Monocytes Relative: 1 %
Neutro Abs: 8.4 10*3/uL — ABNORMAL HIGH (ref 1.4–6.5)
Neutrophils Relative %: 84 %
Platelets: 166 10*3/uL (ref 150–440)
RBC: 4.19 MIL/uL (ref 3.80–5.20)
RDW: 14.4 % (ref 11.5–14.5)
WBC: 10 10*3/uL (ref 3.6–11.0)

## 2014-12-17 LAB — COMPREHENSIVE METABOLIC PANEL
ALT: 20 U/L (ref 14–54)
AST: 31 U/L (ref 15–41)
Albumin: 4.3 g/dL (ref 3.5–5.0)
Alkaline Phosphatase: 52 U/L (ref 38–126)
Anion gap: 13 (ref 5–15)
BUN: 25 mg/dL — ABNORMAL HIGH (ref 6–20)
CO2: 27 mmol/L (ref 22–32)
Calcium: 9.2 mg/dL (ref 8.9–10.3)
Chloride: 101 mmol/L (ref 101–111)
Creatinine, Ser: 1.06 mg/dL — ABNORMAL HIGH (ref 0.44–1.00)
GFR calc Af Amer: 54 mL/min — ABNORMAL LOW (ref >60)
GFR calc non Af Amer: 47 mL/min — ABNORMAL LOW (ref >60)
Glucose, Bld: 237 mg/dL — ABNORMAL HIGH (ref 65–99)
Potassium: 4.1 mmol/L (ref 3.5–5.1)
Sodium: 141 mmol/L (ref 135–145)
Total Bilirubin: 0.8 mg/dL (ref 0.3–1.2)
Total Protein: 7.3 g/dL (ref 6.5–8.1)

## 2014-12-17 MED ORDER — IOHEXOL 300 MG/ML  SOLN
100.0000 mL | Freq: Once | INTRAMUSCULAR | Status: AC | PRN
  Administered 2014-12-17: 100 mL via INTRAVENOUS

## 2014-12-20 ENCOUNTER — Inpatient Hospital Stay (HOSPITAL_BASED_OUTPATIENT_CLINIC_OR_DEPARTMENT_OTHER): Payer: Medicare Other | Admitting: Oncology

## 2014-12-20 ENCOUNTER — Encounter: Payer: Self-pay | Admitting: Oncology

## 2014-12-20 VITALS — BP 130/87 | HR 99 | Temp 98.0°F | Resp 18 | Wt 193.2 lb

## 2014-12-20 DIAGNOSIS — C49A2 Gastrointestinal stromal tumor of stomach: Secondary | ICD-10-CM

## 2014-12-20 DIAGNOSIS — Z79899 Other long term (current) drug therapy: Secondary | ICD-10-CM | POA: Diagnosis not present

## 2014-12-20 DIAGNOSIS — C494 Malignant neoplasm of connective and soft tissue of abdomen: Secondary | ICD-10-CM | POA: Diagnosis not present

## 2014-12-20 DIAGNOSIS — I10 Essential (primary) hypertension: Secondary | ICD-10-CM | POA: Diagnosis not present

## 2014-12-20 DIAGNOSIS — C49A0 Gastrointestinal stromal tumor, unspecified site: Secondary | ICD-10-CM

## 2014-12-21 ENCOUNTER — Encounter: Payer: Self-pay | Admitting: Oncology

## 2014-12-21 DIAGNOSIS — C49A2 Gastrointestinal stromal tumor of stomach: Secondary | ICD-10-CM | POA: Insufficient documentation

## 2014-12-21 NOTE — Progress Notes (Signed)
[place] @ [facility] Telephone:(336) [phone]  Fax:(336) [place]: [birth year]  MR#: [id number]  YHC#:[id number]  Patient Care Team: Jerrol Banana., MD as PCP - General (Family Medicine)  CHIEF COMPLAINT:  Chief Complaint  Patient presents with  . Follow-up    GIST    Oncology History   Chief Complaint/Diagnosis:   1. Gastrointestinal stromal tumor, diagnoses by endoscopy.   2. Ultrasound biopsy on May 29, 2009. 3. Resection of the tumor in November, 2010.     SCC (squamous cell carcinoma), face   06/16/2011 Initial Diagnosis SCC (squamous cell carcinoma), face    No flowsheet data found.  INTERVAL HISTORY: 81 year old lady with a history of gastrointestinal stromal tumor came today further follow-up.  Had a recent CT scan of abdomen.  No abdominal pain.  No nausea.  No vomiting.  No diarrhea.  Patient had a basal cell carcinoma of lower extremity removed.  REVIEW OF SYSTEMS:   ENERAL:  Feels good.  Active.  No fevers, sweats or weight loss. PERFORMANCE STATUS (ECOG):  01 HEENT:  No visual changes, runny nose, sore throat, mouth sores or tenderness. Lungs: No shortness of breath or cough.  No hemoptysis. Cardiac:  No chest pain, palpitations, orthopnea, or PND. GI:  No nausea, vomiting, diarrhea, constipation, melena or hematochezia. GU:  No urgency, frequency, dysuria, or hematuria. Musculoskeletal:  No back pain.  No joint pain.  No muscle tenderness. Extremities:  No pain or swelling. Skin:  No rashes or skin changes. Neuro:  No headache, numbness or weakness, balance or coordination issues. Endocrine:  No diabetes, thyroid issues, hot flashes or night sweats. Psych:  No mood changes, depression or anxiety. Pain:  No focal pain. Review of systems:  All other systems reviewed and found to be negative.  As per HPI. Otherwise, a complete review of systems is negatve.  PAST MEDICAL HISTORY: Past Medical History  Diagnosis Date  .  Hypertension   . Arthritis   . GERD (gastroesophageal reflux disease)   . Hiatal hernia   . Hiatal hernia   . Thyroid disease   . CHF (congestive heart failure)   . Atrial fibrillation     PAST SURGICAL HISTORY: Past Surgical History  Procedure Laterality Date  . Cateract extraction    . Breast surgery    . Abdominal hysterectomy    . Knee surgery    . Right total knee replacement    . Right hip surgery    . Appendectomy    . Removal of gastrointestinal stomatic  tumor of stomach  06/19/2009    FAMILY HISTORY Family History  Problem Relation Age of Onset  . Heart disease Mother   . Hypertension Mother   . Heart attack Mother   . Stomach cancer Father   . Liver cancer Brother   . Hypertension Son   . Huntington's disease Sister   . Diabetes Sister   . Kidney disease Sister   . Congestive Heart Failure Sister   . Pneumonia Sister   . Stroke Brother   . Parkinson's disease Brother   . Hypertension Brother   . Congestive Heart Failure Brother     GYNECOLOGIC HISTORY:  No LMP recorded. Patient has had a hysterectomy.     ADVANCED DIRECTIVES: Patient does have a living will   HEALTH MAINTENANCE: History  Substance Use Topics  . Smoking status: Never Smoker   . Smokeless tobacco: Never Used  . Alcohol Use: No  Colonoscopy:  PAP:  Bone density:  Lipid panel:  Allergies  Allergen Reactions  . Iodinated Diagnostic Agents Rash  . Augmentin [Amoxicillin-Pot Clavulanate] Other (See Comments)    Gi distress   . Codeine   . Crestor [Rosuvastatin] Other (See Comments)    Unknown  . Naproxen Other (See Comments)    unknown  . Prednisone   . Sulfa Antibiotics   . Sulfacetamide Sodium   . Tolectin [Tolmetin]     Current Outpatient Prescriptions  Medication Sig Dispense Refill  . atorvastatin (LIPITOR) 20 MG tablet     . Calcium-Vitamin D 600-200 MG-UNIT per tablet Take 600 tablets by mouth daily.    Marland Kitchen CARTIA XT 120 MG 24 hr capsule     . CELEBREX  200 MG capsule     . COUMADIN 4 MG tablet     . desoximetasone (TOPICORT) 0.05 % cream Apply topically 2 (two) times daily. 30 g 3  . furosemide (LASIX) 20 MG tablet     . gabapentin (NEURONTIN) 600 MG tablet     . imipramine (TOFRANIL) 25 MG tablet     . loratadine (CLARITIN) 10 MG tablet Take by mouth.    . metoprolol (LOPRESSOR) 100 MG tablet     . mometasone (NASONEX) 50 MCG/ACT nasal spray Place into the nose.    . Multiple Vitamins-Minerals (CENTRUM SILVER ULTRA WOMENS PO) Take 1 tablet by mouth daily.    Marland Kitchen olmesartan-hydrochlorothiazide (BENICAR HCT) 20-12.5 MG per tablet Take 20 tablets by mouth. 2-0-12.5 mg    . omeprazole (PRILOSEC) 20 MG capsule     . predniSONE (DELTASONE) 50 MG tablet     . ranitidine (ZANTAC) 150 MG tablet     . SYNTHROID 50 MCG tablet     . TRIBENZOR 20-5-12.5 MG TABS      No current facility-administered medications for this visit.    OBJECTIVE:  Filed Vitals:   12/20/14 1142  BP: 130/87  Pulse: 99  Temp: 98 F (36.7 C)  Resp: 18     Body mass index is 30.26 kg/(m^2).    ECOG FS:1 - Symptomatic but completely ambulatory  PHYSICAL EXAM: GENERAL:  Well developed, well nourished, sitting comfortably in the exam room in no acute distress. MENTAL STATUS:  Alert and oriented to person, place and time. HEAD:    Normocephalic, atraumatic, face symmetric, no Cushingoid features. EYES: .  Pupils equal round and reactive to light and accomodation.  No conjunctivitis or scleral icterus. ENT:  Oropharynx clear without lesion.  Tongue normal. Mucous membranes moist.  RESPIRATORY:  Clear to auscultation without rales, wheezes or rhonchi. CARDIOVASCULAR:  Regular rate and rhythm without murmur, rub or gallop. BREAST:  Right breast without masses, skin changes or nipple discharge.  Left breast without masses, skin changes or nipple discharge. ABDOMEN:  Soft, non-tender, with active bowel sounds, and no hepatosplenomegaly.  No masses. BACK:  No CVA tenderness.   No tenderness on percussion of the back or rib cage. SKIN:  No rashes, ulcers or lesions. EXTREMITIES: No edema, no skin discoloration or tenderness.  No palpable cords. LYMPH NODES: No palpable cervical, supraclavicular, axillary or inguinal adenopathy  NEUROLOGICAL: Unremarkable. PSYCH:  Appropriate.   LAB RESULTS:  No visits with results within 3 Day(s) from this visit. Latest known visit with results is:  Appointment on 12/17/2014  Component Date Value Ref Range Status  . WBC 12/17/2014 10.0  3.6 - 11.0 K/uL Final  . RBC 12/17/2014 4.19  3.80 - 5.20 MIL/uL Final  .  Hemoglobin 12/17/2014 14.3  12.0 - 16.0 g/dL Final  . HCT 12/17/2014 42.0  35.0 - 47.0 % Final  . MCV 12/17/2014 100.2* 80.0 - 100.0 fL Final  . MCH 12/17/2014 34.0  26.0 - 34.0 pg Final  . MCHC 12/17/2014 34.0  32.0 - 36.0 g/dL Final  . RDW 12/17/2014 14.4  11.5 - 14.5 % Final  . Platelets 12/17/2014 166  150 - 440 K/uL Final  . Neutrophils Relative % 12/17/2014 84   Final  . Neutro Abs 12/17/2014 8.4* 1.4 - 6.5 K/uL Final  . Lymphocytes Relative 12/17/2014 15   Final  . Lymphs Abs 12/17/2014 1.5  1.0 - 3.6 K/uL Final  . Monocytes Relative 12/17/2014 1   Final  . Monocytes Absolute 12/17/2014 0.1* 0.2 - 0.9 K/uL Final  . Eosinophils Relative 12/17/2014 0   Final  . Eosinophils Absolute 12/17/2014 0.0  0 - 0.7 K/uL Final  . Basophils Relative 12/17/2014 0   Final  . Basophils Absolute 12/17/2014 0.0  0 - 0.1 K/uL Final  . Sodium 12/17/2014 141  135 - 145 mmol/L Final  . Potassium 12/17/2014 4.1  3.5 - 5.1 mmol/L Final  . Chloride 12/17/2014 101  101 - 111 mmol/L Final  . CO2 12/17/2014 27  22 - 32 mmol/L Final  . Glucose, Bld 12/17/2014 237* 65 - 99 mg/dL Final  . BUN 12/17/2014 25* 6 - 20 mg/dL Final  . Creatinine, Ser 12/17/2014 1.06* 0.44 - 1.00 mg/dL Final  . Calcium 12/17/2014 9.2  8.9 - 10.3 mg/dL Final  . Total Protein 12/17/2014 7.3  6.5 - 8.1 g/dL Final  . Albumin 12/17/2014 4.3  3.5 - 5.0 g/dL Final   . AST 12/17/2014 31  15 - 41 U/L Final  . ALT 12/17/2014 20  14 - 54 U/L Final  . Alkaline Phosphatase 12/17/2014 52  38 - 126 U/L Final  . Total Bilirubin 12/17/2014 0.8  0.3 - 1.2 mg/dL Final  . GFR calc non Af Amer 12/17/2014 47* >60 mL/min Final  . GFR calc Af Amer 12/17/2014 54* >60 mL/min Final   Comment: (NOTE) The eGFR has been calculated using the CKD EPI equation. This calculation has not been validated in all clinical situations. eGFR's persistently <60 mL/min signify possible Chronic Kidney Disease.   . Anion gap 12/17/2014 13  5 - 15 Final      STUDIES: Ct Abdomen W Contrast  12/17/2014   CLINICAL DATA:  Subsequent encounter for GI stromal tumor  EXAM: CT ABDOMEN WITH CONTRAST  TECHNIQUE: Multidetector CT imaging of the abdomen was performed using the standard protocol following bolus administration of intravenous contrast.  CONTRAST:  140m OMNIPAQUE IOHEXOL 300 MG/ML  SOLN  COMPARISON:  12/22/2013.  FINDINGS: No focal abnormality is seen in the liver or spleen. Stomach is decompressed. Soft tissue attenuation adjacent to the stomach measured previously at 5.9 x 3.1 cm measures 5.6 x 2.9 cm today. Duodenum is normal in appearance. There is no gastrohepatic or hepatoduodenal ligament lymphadenopathy. No retroperitoneal lymphadenopathy.  Gallbladder is unremarkable. Pancreas is unremarkable. Areas of cortical scarring are seen in the kidneys bilaterally. There is abdominal aortic atherosclerosis without aneurysm. No evidence for retroperitoneal lymphadenopathy.  IMPRESSION: Slight interval decrease in size of the residual soft tissue adjacent to the stomach. No new or progressive findings on today's study.   Electronically Signed   By: EMisty StanleyM.D.   On: 12/17/2014 13:05    ASSESSMENT: Gastrointestinal stromal tumor status post resection.  No evidence of  recurrent disease  MEDICAL DECISION MAKING:  CT scan has been reviewed independently and with the patient.  There is no  evidence of recurrent disease patient remains asymptomatic continue follow-up without any intervention  Patient expressed understanding and was in agreement with this plan. She also understands that She can call clinic at any time with any questions, concerns, or complaints.    No matching staging information was found for the patient.  Forest Gleason, MD   12/21/2014 6:03 PM

## 2015-01-13 ENCOUNTER — Other Ambulatory Visit: Payer: Self-pay | Admitting: Family Medicine

## 2015-01-14 ENCOUNTER — Ambulatory Visit (INDEPENDENT_AMBULATORY_CARE_PROVIDER_SITE_OTHER): Payer: Medicare Other | Admitting: Podiatry

## 2015-01-14 DIAGNOSIS — M79676 Pain in unspecified toe(s): Secondary | ICD-10-CM

## 2015-01-14 DIAGNOSIS — B351 Tinea unguium: Secondary | ICD-10-CM

## 2015-01-14 NOTE — Progress Notes (Signed)
Presents today chief complaint of painful elongated toenails.  Objective: Pulses are palpable bilateral nails are thick, yellow dystrophic onychomycosis and painful palpation.   Assessment: Onychomycosis with pain in limb.  Plan: Treatment of nails in thickness and length as covered service secondary to pain.  

## 2015-01-21 ENCOUNTER — Ambulatory Visit (INDEPENDENT_AMBULATORY_CARE_PROVIDER_SITE_OTHER): Payer: Medicare Other | Admitting: Family Medicine

## 2015-01-21 ENCOUNTER — Encounter: Payer: Self-pay | Admitting: Family Medicine

## 2015-01-21 VITALS — BP 126/78 | HR 78 | Temp 98.1°F | Resp 16 | Wt 193.0 lb

## 2015-01-21 DIAGNOSIS — I4891 Unspecified atrial fibrillation: Secondary | ICD-10-CM | POA: Diagnosis not present

## 2015-01-21 DIAGNOSIS — R300 Dysuria: Secondary | ICD-10-CM

## 2015-01-21 DIAGNOSIS — R319 Hematuria, unspecified: Secondary | ICD-10-CM | POA: Diagnosis not present

## 2015-01-21 DIAGNOSIS — E119 Type 2 diabetes mellitus without complications: Secondary | ICD-10-CM

## 2015-01-21 DIAGNOSIS — N390 Urinary tract infection, site not specified: Secondary | ICD-10-CM | POA: Diagnosis not present

## 2015-01-21 DIAGNOSIS — B372 Candidiasis of skin and nail: Secondary | ICD-10-CM | POA: Diagnosis not present

## 2015-01-21 LAB — POCT UA - MICROALBUMIN: Microalbumin Ur, POC: 100 mg/L

## 2015-01-21 LAB — POCT INR

## 2015-01-21 LAB — POCT URINALYSIS DIPSTICK
Bilirubin, UA: NEGATIVE
Glucose, UA: NEGATIVE
Ketones, UA: NEGATIVE
Nitrite, UA: NEGATIVE
Protein, UA: NEGATIVE
Spec Grav, UA: 1.02
Urobilinogen, UA: 0.2
pH, UA: 6

## 2015-01-21 LAB — POCT GLYCOSYLATED HEMOGLOBIN (HGB A1C): Hemoglobin A1C: 7

## 2015-01-21 MED ORDER — CIPROFLOXACIN HCL 250 MG PO TABS: 250.0000 mg | ORAL_TABLET | Freq: Two times a day (BID) | ORAL | Status: DC

## 2015-01-21 MED ORDER — NYSTATIN 100000 UNIT/GM EX CREA: 1.0000 "application " | TOPICAL_CREAM | Freq: Two times a day (BID) | CUTANEOUS | Status: DC

## 2015-01-21 NOTE — Progress Notes (Signed)
Subjective:  Hyperglycemia This is a chronic problem. Associated symptoms include a rash. Pertinent negatives include no abdominal pain, anorexia, arthralgias, change in bowel habit, chest pain, chills, congestion, coughing, diaphoresis, fatigue, fever, headaches, joint swelling, myalgias, nausea, neck pain, numbness, sore throat, swollen glands, urinary symptoms, vertigo, visual change, vomiting or weakness.  Atrial Fibrillation Presents for follow-up visit. Symptoms are negative for chest pain and weakness. The symptoms have been stable. Compliance with prior treatments has been good. Past medical history includes atrial fibrillation.  Rash This is a new problem. The problem is unchanged. The affected locations include the groin and genitalia. The rash is characterized by redness. She was exposed to nothing. Pertinent negatives include no anorexia, congestion, cough, fatigue, fever, sore throat or vomiting. Treatments tried: She has tried a salve that she had at home and it worked a Secretary/administrator but not much. The treatment provided mild relief.   She reports that she thinks she has had this rash before. Yesterday she woke up and burned a little bit when she urinated.   Pt is here for a follow up on her blood sugar and her PT/ INR.  Prior to Admission medications   Medication Sig Start Date End Date Taking? Authorizing Provider  atorvastatin (LIPITOR) 20 MG tablet  06/30/13  Yes Historical Provider, MD  Calcium-Vitamin D 600-200 MG-UNIT per tablet Take 600 tablets by mouth daily. 06/01/12  Yes Historical Provider, MD  CARTIA XT 120 MG 24 hr capsule  11/19/14  Yes Historical Provider, MD  CELEBREX 200 MG capsule  06/28/13  Yes Historical Provider, MD  COUMADIN 4 MG tablet TAKE 1 TABLET AS DIRECTED 01/14/15  Yes Jerrol Banana., MD  desoximetasone (TOPICORT) 0.05 % cream Apply topically 2 (two) times daily. 12/04/13  Yes Max T Hyatt, DPM  furosemide (LASIX) 20 MG tablet  07/03/13  Yes Historical  Provider, MD  gabapentin (NEURONTIN) 600 MG tablet  06/21/13  Yes Historical Provider, MD  imipramine (TOFRANIL) 25 MG tablet  04/11/13  Yes Historical Provider, MD  loratadine (CLARITIN) 10 MG tablet Take by mouth. 12/11/13  Yes Historical Provider, MD  metoprolol (LOPRESSOR) 100 MG tablet  06/07/13  Yes Historical Provider, MD  mometasone (NASONEX) 50 MCG/ACT nasal spray Place into the nose. 09/04/14  Yes Historical Provider, MD  Multiple Vitamins-Minerals (CENTRUM SILVER ULTRA WOMENS PO) Take 1 tablet by mouth daily.   Yes Historical Provider, MD  olmesartan-hydrochlorothiazide (BENICAR HCT) 20-12.5 MG per tablet Take 20 tablets by mouth. 2-0-12.5 mg 06/05/14  Yes Historical Provider, MD  omeprazole (PRILOSEC) 20 MG capsule  06/21/13  Yes Historical Provider, MD  predniSONE (DELTASONE) 50 MG tablet  06/21/13  Yes Historical Provider, MD  ranitidine (ZANTAC) 150 MG tablet  06/21/13  Yes Historical Provider, MD  SYNTHROID 50 MCG tablet  05/09/13  Yes Historical Provider, MD  Jabier Gauss 20-5-12.5 MG TABS  06/13/13  Yes Historical Provider, MD    Patient Active Problem List   Diagnosis Date Noted  . 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 Allergic rhinitis 12/07/2014  . Arthritis 12/07/2014  . Atrial fibrillation, controlled 12/07/2014  . Arteriosclerosis of coronary artery 12/07/2014  . Essential (primary) hypertension 12/07/2014  . Acid reflux 12/07/2014  . History of prolonged Q-T interval on ECG 12/07/2014  . Blood glucose elevated 12/07/2014  . Adult hypothyroidism 12/07/2014  . Urinary incontinence 12/07/2014  . Neuropathy 12/07/2014  . Adiposity 12/07/2014  . Osteopenia 12/07/2014  . HLD (hyperlipidemia) 12/07/2014  . Alteration in  bowel elimination: incontinence 12/07/2014  . Hiatal hernia   . Atrial fibrillation   . Basal cell carcinoma of ear 11/14/2014  . Bladder infection, chronic 06/01/2012  . Urethral caruncle 06/01/2012  . SCC (squamous cell  carcinoma), face 06/16/2011  . ABDOMINAL MASS 05/16/2009  . NONSPECIFIC ABN FINDING RAD & OTH EXAM GI TRACT 04/24/2009    Past Medical History  Diagnosis Date  . Hypertension   . Arthritis   . GERD (gastroesophageal reflux disease)   . Hiatal hernia   . Hiatal hernia   . Thyroid disease   . CHF (congestive heart failure)   . Atrial fibrillation     History   Social History  . Marital Status: Widowed    Spouse Name: N/A  . Number of Children: N/A  . Years of Education: N/A   Occupational History  . Not on file.   Social History Main Topics  . Smoking status: Never Smoker   . Smokeless tobacco: Never Used  . Alcohol Use: No  . Drug Use: No  . Sexual Activity: Not on file   Other Topics Concern  . Not on file   Social History Narrative    Allergies  Allergen Reactions  . Iodinated Diagnostic Agents Rash  . Augmentin [Amoxicillin-Pot Clavulanate] Other (See Comments)    Gi distress   . Codeine   . Crestor [Rosuvastatin] Other (See Comments)    Unknown  . Naproxen Other (See Comments)    unknown  . Prednisone   . Sulfa Antibiotics   . Sulfacetamide Sodium   . Tolectin [Tolmetin]     Review of Systems  Constitutional: Negative for fever, chills, diaphoresis and fatigue.  HENT: Negative for congestion and sore throat.   Respiratory: Negative for cough.   Cardiovascular: Negative for chest pain.  Gastrointestinal: Negative for nausea, vomiting, abdominal pain, anorexia and change in bowel habit.  Musculoskeletal: Negative for myalgias, joint swelling, arthralgias and neck pain.  Skin: Positive for rash.  Neurological: Negative for vertigo, weakness, numbness and headaches.    Immunization History  Administered Date(s) Administered  . Td 06/20/2004   Objective:  BP 126/78 mmHg  Pulse 78  Temp(Src) 98.1 F (36.7 C) (Oral)  Resp 16  Wt 193 lb (87.544 kg)  Physical Exam  Constitutional: She is oriented to person, place, and time and  well-developed, well-nourished, and in no distress.  HENT:  Head: Normocephalic and atraumatic.  Right Ear: External ear normal.  Left Ear: External ear normal.  Nose: Nose normal.  Mouth/Throat: Oropharynx is clear and moist.  Eyes: Conjunctivae and EOM are normal. Pupils are equal, round, and reactive to light.  Neck: Normal range of motion.  Cardiovascular: Normal rate, regular rhythm, normal heart sounds and intact distal pulses.   Pulmonary/Chest: Effort normal and breath sounds normal.  Abdominal: Soft. Bowel sounds are normal.  Musculoskeletal: Normal range of motion.  Neurological: She is alert and oriented to person, place, and time. Gait normal.  Walks with canedue to OA.  Skin: Skin is warm.  Groin has mild changes c/w monilia.  Psychiatric: Mood, memory, affect and judgment normal.    Lab Results  Component Value Date   WBC 10.0 12/17/2014   HGB 14.3 12/17/2014   HCT 42.0 12/17/2014   PLT 166 12/17/2014   GLUCOSE 237* 12/17/2014   CHOL 181 12/11/2013   TRIG 376* 12/11/2013   HDL 43 12/11/2013   LDLCALC 63 12/11/2013   TSH 1.13 04/18/2014   INR 2.0 06/12/2014   HGBA1C  6.8* 12/11/2013    CMP     Component Value Date/Time   NA 141 12/17/2014 0948   NA 144 07/10/2014 1022   NA 146 06/14/2013   K 4.1 12/17/2014 0948   K 4.3 07/10/2014 1022   CL 101 12/17/2014 0948   CL 102 07/10/2014 1022   CO2 27 12/17/2014 0948   CO2 33* 07/10/2014 1022   GLUCOSE 237* 12/17/2014 0948   GLUCOSE 134* 07/10/2014 1022   BUN 25* 12/17/2014 0948   BUN 18 07/10/2014 1022   BUN 23* 06/14/2013   CREATININE 1.06* 12/17/2014 0948   CREATININE 1.07 07/10/2014 1022   CREATININE 1.0 06/14/2013   CALCIUM 9.2 12/17/2014 0948   CALCIUM 9.5 07/10/2014 1022   PROT 7.3 12/17/2014 0948   PROT 6.8 07/10/2014 1022   ALBUMIN 4.3 12/17/2014 0948   ALBUMIN 3.7 07/10/2014 1022   AST 31 12/17/2014 0948   AST 22 07/10/2014 1022   ALT 20 12/17/2014 0948   ALT 24 07/10/2014 1022   ALKPHOS  52 12/17/2014 0948   ALKPHOS 60 07/10/2014 1022   BILITOT 0.8 12/17/2014 0948   GFRNONAA 47* 12/17/2014 0948   GFRNONAA 44* 12/25/2013 1036   GFRAA 54* 12/17/2014 0948   GFRAA 52* 12/25/2013 1036    Assessment and Plan :  T2DM--A1C 7 today--last was 6.6. At age 81 I am pleased with control of BS as avoiding any hypoglycemia Is very important. No changes in DM care.  AFib--INR 2.4 today--no change in coumadin--PT 4 weeks.  Monilia Dermatitis--Rx with Nystatin cream. Pt instructed in skin care to try to prevent recurrence.  HTN  HLD  OA--Rx written for Rollator to help lower fall risk. Offered PT referral for evaluation of best appliance to lower fall risk. Overall more than 25 minutes spent and more than 50% spent in counselling regarding multiple issues.   Miguel Aschoff MD [facility] 01/21/2015 11:03 AM

## 2015-01-25 ENCOUNTER — Telehealth: Payer: Self-pay | Admitting: Family Medicine

## 2015-01-25 NOTE — Telephone Encounter (Signed)
OK to do this--Dx is Ostoarthritis. thanks

## 2015-01-25 NOTE — Telephone Encounter (Signed)
Patient needs rx faxed to [facility] in [place] for the [place] (walker w/ wheels/seat).   The fax #  Is [phone]

## 2015-01-28 NOTE — Telephone Encounter (Signed)
This is done. -aa

## 2015-01-30 ENCOUNTER — Encounter: Payer: Self-pay | Admitting: Family Medicine

## 2015-01-30 ENCOUNTER — Ambulatory Visit (INDEPENDENT_AMBULATORY_CARE_PROVIDER_SITE_OTHER): Payer: Medicare Other | Admitting: Family Medicine

## 2015-01-30 VITALS — BP 138/72 | HR 86 | Temp 98.5°F | Resp 20 | Ht 67.0 in | Wt 194.0 lb

## 2015-01-30 DIAGNOSIS — J3089 Other allergic rhinitis: Secondary | ICD-10-CM | POA: Diagnosis not present

## 2015-01-30 DIAGNOSIS — J0110 Acute frontal sinusitis, unspecified: Secondary | ICD-10-CM | POA: Diagnosis not present

## 2015-01-30 MED ORDER — AMOXICILLIN 500 MG PO CAPS: 500.0000 mg | ORAL_CAPSULE | Freq: Three times a day (TID) | ORAL | Status: DC

## 2015-01-30 MED ORDER — MOMETASONE FUROATE 50 MCG/ACT NA SUSP: 2.0000 | Freq: Every day | NASAL | Status: DC

## 2015-01-30 NOTE — Progress Notes (Signed)
Patient ID: Dawn Burnett, female   DOB: 06/05/1929, 81 y.o.   MRN: [id number]   Patient: Dawn Burnett Female    DOB: [birth year]   81 y.o.   MRN: [id number] Visit Date: 01/30/2015  Today's Provider: [name], MD   Chief Complaint  Patient presents with  . URI   Subjective:    URI  This is a new (started to feel bad Saturday) problem. The problem has been gradually worsening. There has been no fever. Associated symptoms include congestion, headaches, joint pain, rhinorrhea, sinus pain, sneezing and a sore throat. Pertinent negatives include no abdominal pain, chest pain, coughing, diarrhea, dysuria, ear pain, nausea, neck pain, rash, swollen glands, vomiting or wheezing. She has tried acetaminophen for the symptoms. The treatment provided no relief.  Patient states that she was on Cipro recently for UTI and finished taking that this past Saturday and that is when her symptoms of cold began. She feels malaise, right sife of her face does not feel right-possibly pressure, she does break out in sweat. No fever that she knows of.     Previous Medications   ATORVASTATIN (LIPITOR) 20 MG TABLET       CALCIUM-VITAMIN D 600-200 MG-UNIT PER TABLET    Take 600 tablets by mouth daily.   CARTIA XT 120 MG 24 HR CAPSULE       CELEBREX 200 MG CAPSULE       CIPROFLOXACIN (CIPRO) 250 MG TABLET    Take 1 tablet (250 mg total) by mouth 2 (two) times daily.   COUMADIN 4 MG TABLET    TAKE 1 TABLET AS DIRECTED   DESOXIMETASONE (TOPICORT) 0.05 % CREAM    Apply topically 2 (two) times daily.   FUROSEMIDE (LASIX) 20 MG TABLET       GABAPENTIN (NEURONTIN) 600 MG TABLET       IMIPRAMINE (TOFRANIL) 25 MG TABLET       LORATADINE (CLARITIN) 10 MG TABLET    Take by mouth.   METOPROLOL (LOPRESSOR) 100 MG TABLET       MOMETASONE (NASONEX) 50 MCG/ACT NASAL SPRAY    Place into the nose.   MULTIPLE VITAMINS-MINERALS (CENTRUM SILVER ULTRA WOMENS PO)    Take 1 tablet by mouth daily.   NYSTATIN CREAM (MYCOSTATIN)     Apply 1 application topically 2 (two) times daily. Apply to affected area   OLMESARTAN-HYDROCHLOROTHIAZIDE (BENICAR HCT) 20-12.5 MG PER TABLET    Take 20 tablets by mouth. 2-0-12.5 mg   OMEPRAZOLE (PRILOSEC) 20 MG CAPSULE       PREDNISONE (DELTASONE) 50 MG TABLET       RANITIDINE (ZANTAC) 150 MG TABLET       SYNTHROID 50 MCG TABLET       TRIBENZOR 20-5-12.5 MG TABS        Review of Systems  Constitutional: Positive for fatigue. Negative for chills.  HENT: Positive for congestion, rhinorrhea, sinus pressure, sneezing and sore throat. Negative for ear pain.   Respiratory: Positive for shortness of breath (with walking a long distance-chronic). Negative for apnea, cough, choking, chest tightness and wheezing.   Cardiovascular: Negative for chest pain, palpitations and leg swelling.       Irregular heart beat today-chronic per patient  Gastrointestinal: Negative for nausea, vomiting, abdominal pain and diarrhea.  Genitourinary: Negative for dysuria.  Musculoskeletal: Positive for joint pain, arthralgias and gait problem. Negative for neck pain.  Skin: Negative for rash.  Neurological: Positive for weakness and headaches.    History  Substance Use Topics  . Smoking status: Never Smoker   . Smokeless tobacco: Never Used  . Alcohol Use: No   Objective:   BP 138/72 mmHg  Pulse 86  Temp(Src) 98.5 F (36.9 C)  Resp 20  Ht 5\' 7"  (1.702 m)  Wt 194 lb (87.998 kg)  BMI 30.38 kg/m2  SpO2 96%  Physical Exam  Constitutional: She appears well-developed and well-nourished.  HENT:  Head: Normocephalic.  Nose: Right sinus exhibits maxillary sinus tenderness. Right sinus exhibits no frontal sinus tenderness. Left sinus exhibits no maxillary sinus tenderness and no frontal sinus tenderness.  Mouth/Throat: Uvula is midline and oropharynx is clear and moist.  Mild swelling and soft tissue under Right eye  Eyes: Conjunctivae are normal. Pupils are equal, round, and reactive to light.         Assessment & Plan:     1. Acute frontal sinusitis, recurrence not specified New. Will start Amox-patient states she does fine with Amox and can not tolerate Augmentin due to GI issues. Will also provide Nasonex. Follow up as needed. - amoxicillin (AMOXIL) 500 MG capsule; Take 1 capsule (500 mg total) by mouth 3 (three) times daily.  Dispense: 30 capsule; Refill: 1  2. AR Start Nasonex.  3. Atrial fibrillation Controlled  4. CAD All risk factors treated.  I have done the exam and reviewed the above chart and it is accurate to the best of my knowledge.   Patient was seen and examined by Dr. Eulas Post and note was scribed by Theressa Millard, RMA.

## 2015-02-19 ENCOUNTER — Other Ambulatory Visit: Payer: Self-pay | Admitting: Family Medicine

## 2015-02-20 ENCOUNTER — Ambulatory Visit (INDEPENDENT_AMBULATORY_CARE_PROVIDER_SITE_OTHER): Payer: Medicare Other

## 2015-02-20 DIAGNOSIS — I4891 Unspecified atrial fibrillation: Secondary | ICD-10-CM

## 2015-02-20 LAB — POCT INR
INR: 2.1
PT: 25.6

## 2015-02-20 NOTE — Patient Instructions (Signed)
PT/INR today INR 2.1  Continue same dose, Coumadin 4 mg daily. Come back in 4 weeks for PT/INR check Call if any questions or concerns.

## 2015-03-18 ENCOUNTER — Other Ambulatory Visit: Payer: Self-pay | Admitting: Family Medicine

## 2015-03-20 ENCOUNTER — Ambulatory Visit (INDEPENDENT_AMBULATORY_CARE_PROVIDER_SITE_OTHER): Payer: Medicare Other

## 2015-03-20 ENCOUNTER — Other Ambulatory Visit: Payer: Self-pay | Admitting: Family Medicine

## 2015-03-20 DIAGNOSIS — I4891 Unspecified atrial fibrillation: Secondary | ICD-10-CM

## 2015-03-20 LAB — POCT INR
INR: 2.4
Protime: 28.3 seconds

## 2015-03-20 NOTE — Patient Instructions (Signed)
Continue same dose, coumadin 4 mg Daily. Call if any questions or concerns. Come back in 4 weeks.

## 2015-03-25 ENCOUNTER — Telehealth: Payer: Self-pay | Admitting: Family Medicine

## 2015-03-25 NOTE — Telephone Encounter (Signed)
CELEBREX 200 MG capsule Taking 06/28/13 -- Historical Provider, MD Notes: Received from: External Pharmacy  Patient said the [facility] called and said it was denied because of interaction with coumadin.    She said she has been taking it together for 10 years.  She will out Wednesday.  Thanks Con Memos

## 2015-03-25 NOTE — Telephone Encounter (Signed)
See below-aa 

## 2015-03-26 NOTE — Telephone Encounter (Signed)
Advised patient as below. Patient wants to know what other medications can she take to help with her pain? She reports that she can not go without taking any medication for pain. Please advise. Thanks!

## 2015-03-26 NOTE — Telephone Encounter (Signed)
It truly is not safe to continue this combination. I would strongly recommend she stop this.

## 2015-03-27 NOTE — Telephone Encounter (Signed)
Spoke with patient and she states that Tylenol she is already taking like 5 tablets daily. Will try Tramadol-how would you like to have this written out? Please review-aa

## 2015-03-27 NOTE — Telephone Encounter (Signed)
Tylenol is the safest. We can try try tramadol if needed.

## 2015-03-28 MED ORDER — TRAMADOL HCL 50 MG PO TABS: 50.0000 mg | ORAL_TABLET | ORAL | Status: DC | PRN

## 2015-03-28 NOTE — Telephone Encounter (Signed)
1 po q 4 prn pain,#100,5rf.

## 2015-03-28 NOTE — Telephone Encounter (Signed)
RX called in-aa 

## 2015-04-11 ENCOUNTER — Telehealth: Payer: Self-pay | Admitting: Family Medicine

## 2015-04-11 NOTE — Telephone Encounter (Signed)
Patient is requesting a different ani inflammatory, reports that Tramadol make give her a "wierd feeling". Patient reports that she is take Tylenol 1000 mg for tonight, she reports it helps some. Please advise. sd

## 2015-04-11 NOTE — Telephone Encounter (Signed)
Pt called saying she can not taking the pain medication that has been prescribed.  She said she cant explain how bad it makes her feel. She wants to know if there is another anti inflammatory that she can take,  Her call back  [phone]  Thanks Con Memos

## 2015-04-12 MED ORDER — CELECOXIB 200 MG PO CAPS: 200.0000 mg | ORAL_CAPSULE | Freq: Every day | ORAL | Status: DC | PRN

## 2015-04-12 NOTE — Telephone Encounter (Signed)
Pt advised. Tylenol she takes 2 tablets three times daily. HEr pain has gotten worse since been off Celebrex. Shoulder is hurting and it is hard to raise it her knees are worse, she is aware with risks Celebrex can cause on Coumadin and wants to go back on it still. Please review-aa

## 2015-04-12 NOTE — Telephone Encounter (Signed)
Tylenol at night like that is the safest thing she can take. Any anti-inflammatory, including Celebrex, is not worth the risk ongoing with coumadin.

## 2015-04-12 NOTE — Telephone Encounter (Signed)
LMTCB  aa 

## 2015-04-12 NOTE — Telephone Encounter (Signed)
Generic Celebrex daily when necessary. I am worried about the combination of Celebrex and Coumadin for this patient and the risk of GI bleed.

## 2015-04-12 NOTE — Telephone Encounter (Signed)
RX sent in and pt advised-aa 

## 2015-04-17 ENCOUNTER — Ambulatory Visit (INDEPENDENT_AMBULATORY_CARE_PROVIDER_SITE_OTHER): Payer: Medicare Other

## 2015-04-17 DIAGNOSIS — I4891 Unspecified atrial fibrillation: Secondary | ICD-10-CM

## 2015-04-17 LAB — POCT INR
INR: 2.6
PT: 31.3

## 2015-04-17 NOTE — Patient Instructions (Signed)
Continue same dose, coumadin 4 mg Daily. Call if any questions or concerns. Come back in 4 weeks. 

## 2015-04-18 ENCOUNTER — Ambulatory Visit (INDEPENDENT_AMBULATORY_CARE_PROVIDER_SITE_OTHER): Payer: Medicare Other | Admitting: Podiatry

## 2015-04-18 DIAGNOSIS — M79676 Pain in unspecified toe(s): Secondary | ICD-10-CM

## 2015-04-18 DIAGNOSIS — L97511 Non-pressure chronic ulcer of other part of right foot limited to breakdown of skin: Secondary | ICD-10-CM

## 2015-04-18 DIAGNOSIS — B351 Tinea unguium: Secondary | ICD-10-CM

## 2015-04-18 NOTE — Progress Notes (Signed)
Subjective: 81 y.o. returns the office today for painful, elongated, thickened toenails which she is unable to trim herself. Denies any redness or drainage around the nails. She also states that she has a sore to the end of her right third toe. She states that she previously has had some her second toe and the area will scab and fall off. This has been ongoing the last 3-4 weeks she states has not fallen off yet. Denies any acute changes since last appointment and no new complaints today. Denies any systemic complaints such as fevers, chills, nausea, vomiting.   Objective: AAO 3, NAD DP/PT pulses palpable, CRT less than 3 seconds  Nails hypertrophic, dystrophic, elongated, brittle, discolored 10. There is tenderness overlying the nails 1-5 bilaterally. There is no surrounding erythema or drainage along the nail sites. On the distal aspect of the right third digit there appears to be a superficial wound with a granular wound base. Wound appears to be almost an abrasion/old blister formation. This is likely from pressure. There is no swelling erythema, ascending saline, fluctuance, Is, malodor, drainage/purulence. Underlying hammertoe contractures. No open lesions or pre-ulcerative lesions are identified. No other areas of tenderness bilateral lower extremities. No overlying edema, erythema, increased warmth. No pain with calf compression, swelling, warmth, erythema.  Assessment: Patient presents with symptomatic onychomycosis; right third toe wound  Plan: -Treatment options including alternatives, risks, complications were discussed -Nails sharply debrided 10 without complication/bleeding. -Continue with antibiotic ointment and a bandage over on the right third toe. Continue daily dressing changes. Monitor for any clinical signs or symptoms of infection and directed to call the office immediately should any occur or go to the ER. -Discussed daily foot inspection. If there are any changes, to  call the office immediately.  -Follow-up in 3 weeks or sooner if any problems are to arise. In the meantime, encouraged to call the office with any questions, concerns, changes symptoms.  Celesta Gentile, DPM

## 2015-05-09 ENCOUNTER — Ambulatory Visit: Payer: Medicare Other | Admitting: Podiatry

## 2015-05-13 ENCOUNTER — Other Ambulatory Visit: Payer: Self-pay | Admitting: Family Medicine

## 2015-05-15 ENCOUNTER — Ambulatory Visit (INDEPENDENT_AMBULATORY_CARE_PROVIDER_SITE_OTHER): Payer: Medicare Other

## 2015-05-15 DIAGNOSIS — I4891 Unspecified atrial fibrillation: Secondary | ICD-10-CM | POA: Diagnosis not present

## 2015-05-15 LAB — POCT INR
INR: 2.6
PT: 31.8

## 2015-05-15 NOTE — Patient Instructions (Signed)
Continue same dose of Coumadin. Return in 4 weeks. Call if any questions or concerns.

## 2015-05-21 ENCOUNTER — Inpatient Hospital Stay
Admission: EM | Admit: 2015-05-21 | Discharge: 2015-05-23 | DRG: 310 | Disposition: A | Discharge status: Skilled Nursing Facility | Payer: Medicare Other | Attending: Internal Medicine | Admitting: Internal Medicine

## 2015-05-21 ENCOUNTER — Other Ambulatory Visit: Payer: Self-pay

## 2015-05-21 ENCOUNTER — Encounter: Payer: Self-pay | Admitting: Emergency Medicine

## 2015-05-21 ENCOUNTER — Emergency Department: Payer: Medicare Other

## 2015-05-21 ENCOUNTER — Inpatient Hospital Stay
Admit: 2015-05-21 | Discharge: 2015-05-21 | Disposition: A | Discharge status: Home / Self Care | Payer: Medicare Other | Attending: Internal Medicine | Admitting: Internal Medicine

## 2015-05-21 DIAGNOSIS — Z833 Family history of diabetes mellitus: Secondary | ICD-10-CM

## 2015-05-21 DIAGNOSIS — R0602 Shortness of breath: Secondary | ICD-10-CM

## 2015-05-21 DIAGNOSIS — Z820 Family history of epilepsy and other diseases of the nervous system: Secondary | ICD-10-CM

## 2015-05-21 DIAGNOSIS — E039 Hypothyroidism, unspecified: Secondary | ICD-10-CM | POA: Diagnosis present

## 2015-05-21 DIAGNOSIS — K219 Gastro-esophageal reflux disease without esophagitis: Secondary | ICD-10-CM | POA: Diagnosis present

## 2015-05-21 DIAGNOSIS — Z9049 Acquired absence of other specified parts of digestive tract: Secondary | ICD-10-CM | POA: Diagnosis not present

## 2015-05-21 DIAGNOSIS — Z96651 Presence of right artificial knee joint: Secondary | ICD-10-CM | POA: Diagnosis present

## 2015-05-21 DIAGNOSIS — Z823 Family history of stroke: Secondary | ICD-10-CM | POA: Diagnosis not present

## 2015-05-21 DIAGNOSIS — Z7902 Long term (current) use of antithrombotics/antiplatelets: Secondary | ICD-10-CM | POA: Diagnosis not present

## 2015-05-21 DIAGNOSIS — I4891 Unspecified atrial fibrillation: Secondary | ICD-10-CM | POA: Diagnosis present

## 2015-05-21 DIAGNOSIS — I110 Hypertensive heart disease with heart failure: Secondary | ICD-10-CM | POA: Diagnosis present

## 2015-05-21 DIAGNOSIS — Z66 Do not resuscitate: Secondary | ICD-10-CM | POA: Diagnosis present

## 2015-05-21 DIAGNOSIS — Z882 Allergy status to sulfonamides status: Secondary | ICD-10-CM | POA: Diagnosis not present

## 2015-05-21 DIAGNOSIS — Z79899 Other long term (current) drug therapy: Secondary | ICD-10-CM

## 2015-05-21 DIAGNOSIS — T380X5A Adverse effect of glucocorticoids and synthetic analogues, initial encounter: Secondary | ICD-10-CM | POA: Diagnosis present

## 2015-05-21 DIAGNOSIS — Z8249 Family history of ischemic heart disease and other diseases of the circulatory system: Secondary | ICD-10-CM | POA: Diagnosis not present

## 2015-05-21 DIAGNOSIS — Z90710 Acquired absence of both cervix and uterus: Secondary | ICD-10-CM | POA: Diagnosis not present

## 2015-05-21 DIAGNOSIS — R7301 Impaired fasting glucose: Secondary | ICD-10-CM | POA: Diagnosis present

## 2015-05-21 DIAGNOSIS — M1990 Unspecified osteoarthritis, unspecified site: Secondary | ICD-10-CM | POA: Diagnosis present

## 2015-05-21 DIAGNOSIS — Z886 Allergy status to analgesic agent status: Secondary | ICD-10-CM

## 2015-05-21 DIAGNOSIS — I509 Heart failure, unspecified: Secondary | ICD-10-CM | POA: Diagnosis present

## 2015-05-21 DIAGNOSIS — Z888 Allergy status to other drugs, medicaments and biological substances status: Secondary | ICD-10-CM | POA: Diagnosis not present

## 2015-05-21 DIAGNOSIS — R350 Frequency of micturition: Secondary | ICD-10-CM | POA: Diagnosis present

## 2015-05-21 DIAGNOSIS — Z9849 Cataract extraction status, unspecified eye: Secondary | ICD-10-CM

## 2015-05-21 DIAGNOSIS — K449 Diaphragmatic hernia without obstruction or gangrene: Secondary | ICD-10-CM | POA: Diagnosis present

## 2015-05-21 DIAGNOSIS — I4819 Other persistent atrial fibrillation: Secondary | ICD-10-CM

## 2015-05-21 DIAGNOSIS — Z800 Family history of malignant neoplasm of digestive organs: Secondary | ICD-10-CM | POA: Diagnosis not present

## 2015-05-21 LAB — CBC
HCT: 45.2 % (ref 35.0–47.0)
Hemoglobin: 15.2 g/dL (ref 12.0–16.0)
MCH: 33.4 pg (ref 26.0–34.0)
MCHC: 33.8 g/dL (ref 32.0–36.0)
MCV: 99 fL (ref 80.0–100.0)
Platelets: 173 10*3/uL (ref 150–440)
RBC: 4.56 MIL/uL (ref 3.80–5.20)
RDW: 14.4 % (ref 11.5–14.5)
WBC: 12.5 10*3/uL — ABNORMAL HIGH (ref 3.6–11.0)

## 2015-05-21 LAB — URINALYSIS COMPLETE WITH MICROSCOPIC (ARMC ONLY)
Bilirubin Urine: NEGATIVE
Glucose, UA: NEGATIVE mg/dL
Hgb urine dipstick: NEGATIVE
Ketones, ur: NEGATIVE mg/dL
Nitrite: NEGATIVE
Protein, ur: NEGATIVE mg/dL
Specific Gravity, Urine: 1.009 (ref 1.005–1.030)
pH: 7 (ref 5.0–8.0)

## 2015-05-21 LAB — COMPREHENSIVE METABOLIC PANEL
ALT: 22 U/L (ref 14–54)
AST: 22 U/L (ref 15–41)
Albumin: 4.2 g/dL (ref 3.5–5.0)
Alkaline Phosphatase: 56 U/L (ref 38–126)
Anion gap: 11 (ref 5–15)
BUN: 29 mg/dL — ABNORMAL HIGH (ref 6–20)
CO2: 31 mmol/L (ref 22–32)
Calcium: 9.7 mg/dL (ref 8.9–10.3)
Chloride: 99 mmol/L — ABNORMAL LOW (ref 101–111)
Creatinine, Ser: 0.96 mg/dL (ref 0.44–1.00)
GFR calc Af Amer: >60 mL/min (ref >60)
GFR calc non Af Amer: 53 mL/min — ABNORMAL LOW (ref >60)
Glucose, Bld: 176 mg/dL — ABNORMAL HIGH (ref 65–99)
Potassium: 4.3 mmol/L (ref 3.5–5.1)
Sodium: 141 mmol/L (ref 135–145)
Total Bilirubin: 1.6 mg/dL — ABNORMAL HIGH (ref 0.3–1.2)
Total Protein: 7.2 g/dL (ref 6.5–8.1)

## 2015-05-21 LAB — TROPONIN I
Troponin I: <0.03 ng/mL (ref <0.031)
Troponin I: <0.03 ng/mL (ref <0.031)
Troponin I: <0.03 ng/mL (ref <0.031)

## 2015-05-21 LAB — PROTIME-INR
INR: 2.03
Prothrombin Time: 23.1 seconds — ABNORMAL HIGH (ref 11.4–15.0)

## 2015-05-21 LAB — TSH: TSH: 2.701 u[IU]/mL (ref 0.350–4.500)

## 2015-05-21 LAB — BRAIN NATRIURETIC PEPTIDE: B Natriuretic Peptide: 372 pg/mL — ABNORMAL HIGH (ref 0.0–100.0)

## 2015-05-21 MED ORDER — DILTIAZEM HCL ER 120 MG PO CP24
120.0000 mg | ORAL_CAPSULE | Freq: Every day | ORAL | Status: DC
  Administered 2015-05-22 – 2015-05-23 (×2): 120 mg via ORAL
  Filled 2015-05-21 (×4): qty 1

## 2015-05-21 MED ORDER — METOPROLOL TARTRATE 50 MG PO TABS
50.0000 mg | ORAL_TABLET | Freq: Once | ORAL | Status: AC
  Administered 2015-05-21: 50 mg via ORAL
  Filled 2015-05-21: qty 1

## 2015-05-21 MED ORDER — FUROSEMIDE 20 MG PO TABS
20.0000 mg | ORAL_TABLET | Freq: Every day | ORAL | Status: DC
  Administered 2015-05-22 – 2015-05-23 (×2): 20 mg via ORAL
  Filled 2015-05-21 (×2): qty 1

## 2015-05-21 MED ORDER — DIGOXIN 0.25 MG/ML IJ SOLN
0.2500 mg | Freq: Every day | INTRAMUSCULAR | Status: DC
  Administered 2015-05-21 – 2015-05-23 (×3): 0.25 mg via INTRAVENOUS
  Filled 2015-05-21 (×3): qty 1

## 2015-05-21 MED ORDER — WARFARIN - PHYSICIAN DOSING INPATIENT
Freq: Every day | Status: DC
  Administered 2015-05-21 – 2015-05-22 (×2)

## 2015-05-21 MED ORDER — HYDROCHLOROTHIAZIDE 12.5 MG PO CAPS
12.5000 mg | ORAL_CAPSULE | Freq: Every day | ORAL | Status: DC
  Administered 2015-05-22 – 2015-05-23 (×2): 12.5 mg via ORAL
  Filled 2015-05-21 (×2): qty 1

## 2015-05-21 MED ORDER — CALCIUM CITRATE 950 (200 CA) MG PO TABS
400.0000 mg | ORAL_TABLET | Freq: Every day | ORAL | Status: DC
  Filled 2015-05-21: qty 2

## 2015-05-21 MED ORDER — METOPROLOL TARTRATE 100 MG PO TABS
100.0000 mg | ORAL_TABLET | Freq: Two times a day (BID) | ORAL | Status: DC
  Administered 2015-05-21 – 2015-05-23 (×4): 100 mg via ORAL
  Filled 2015-05-21 (×4): qty 1

## 2015-05-21 MED ORDER — ONDANSETRON HCL 4 MG/2ML IJ SOLN: 4.0000 mg | Freq: Four times a day (QID) | INTRAMUSCULAR | Status: DC | PRN

## 2015-05-21 MED ORDER — IRBESARTAN 150 MG PO TABS
150.0000 mg | ORAL_TABLET | Freq: Every day | ORAL | Status: DC
  Administered 2015-05-22 – 2015-05-23 (×2): 150 mg via ORAL
  Filled 2015-05-21 (×2): qty 1

## 2015-05-21 MED ORDER — CALCIUM CARBONATE ANTACID 500 MG PO CHEW
400.0000 mg | CHEWABLE_TABLET | Freq: Every day | ORAL | Status: DC
  Administered 2015-05-21 – 2015-05-22 (×2): 400 mg via ORAL
  Filled 2015-05-21 (×2): qty 2

## 2015-05-21 MED ORDER — OLMESARTAN MEDOXOMIL-HCTZ 20-12.5 MG PO TABS: 1.0000 | ORAL_TABLET | Freq: Every day | ORAL | Status: DC

## 2015-05-21 MED ORDER — GABAPENTIN 600 MG PO TABS
600.0000 mg | ORAL_TABLET | Freq: Three times a day (TID) | ORAL | Status: DC
  Administered 2015-05-21 – 2015-05-23 (×7): 600 mg via ORAL
  Filled 2015-05-21 (×7): qty 1

## 2015-05-21 MED ORDER — ATORVASTATIN CALCIUM 20 MG PO TABS
20.0000 mg | ORAL_TABLET | Freq: Every day | ORAL | Status: DC
  Administered 2015-05-21 – 2015-05-22 (×2): 20 mg via ORAL
  Filled 2015-05-21 (×2): qty 1

## 2015-05-21 MED ORDER — ACETAMINOPHEN 325 MG PO TABS
650.0000 mg | ORAL_TABLET | Freq: Four times a day (QID) | ORAL | Status: DC | PRN
  Administered 2015-05-21 – 2015-05-22 (×2): 650 mg via ORAL
  Filled 2015-05-21 (×3): qty 2

## 2015-05-21 MED ORDER — PANTOPRAZOLE SODIUM 40 MG PO TBEC
40.0000 mg | DELAYED_RELEASE_TABLET | Freq: Two times a day (BID) | ORAL | Status: DC
  Administered 2015-05-21 – 2015-05-23 (×4): 40 mg via ORAL
  Filled 2015-05-21 (×4): qty 1

## 2015-05-21 MED ORDER — IMIPRAMINE HCL 25 MG PO TABS
25.0000 mg | ORAL_TABLET | Freq: Every day | ORAL | Status: DC
  Administered 2015-05-21 – 2015-05-22 (×2): 25 mg via ORAL
  Filled 2015-05-21 (×3): qty 1

## 2015-05-21 MED ORDER — CALCIUM-VITAMIN D 600-200 MG-UNIT PO TABS: 600.0000 | ORAL_TABLET | Freq: Every day | ORAL | Status: DC

## 2015-05-21 MED ORDER — ATORVASTATIN CALCIUM 20 MG PO TABS: 20.0000 mg | ORAL_TABLET | Freq: Every day | ORAL | Status: DC

## 2015-05-21 MED ORDER — CELECOXIB 200 MG PO CAPS
200.0000 mg | ORAL_CAPSULE | Freq: Every day | ORAL | Status: DC
  Administered 2015-05-22 – 2015-05-23 (×2): 200 mg via ORAL
  Filled 2015-05-21 (×2): qty 1

## 2015-05-21 MED ORDER — INFLUENZA VAC SPLIT QUAD 0.5 ML IM SUSY
0.5000 mL | PREFILLED_SYRINGE | INTRAMUSCULAR | Status: AC
  Administered 2015-05-22: 0.5 mL via INTRAMUSCULAR
  Filled 2015-05-21: qty 0.5

## 2015-05-21 MED ORDER — ONDANSETRON HCL 4 MG PO TABS: 4.0000 mg | ORAL_TABLET | Freq: Four times a day (QID) | ORAL | Status: DC | PRN

## 2015-05-21 MED ORDER — CARBAMIDE PEROXIDE 6.5 % OT SOLN
5.0000 [drp] | Freq: Two times a day (BID) | OTIC | Status: DC
  Administered 2015-05-21 – 2015-05-23 (×5): 5 [drp] via OTIC
  Filled 2015-05-21: qty 15

## 2015-05-21 MED ORDER — LEVOTHYROXINE SODIUM 50 MCG PO TABS
50.0000 ug | ORAL_TABLET | Freq: Every day | ORAL | Status: DC
  Administered 2015-05-22 – 2015-05-23 (×2): 50 ug via ORAL
  Filled 2015-05-21 (×2): qty 1

## 2015-05-21 MED ORDER — ACETAMINOPHEN 650 MG RE SUPP: 650.0000 mg | Freq: Four times a day (QID) | RECTAL | Status: DC | PRN

## 2015-05-21 MED ORDER — SODIUM CHLORIDE 0.9 % IJ SOLN
3.0000 mL | Freq: Two times a day (BID) | INTRAMUSCULAR | Status: DC
  Administered 2015-05-21 – 2015-05-23 (×5): 3 mL via INTRAVENOUS

## 2015-05-21 MED ORDER — ADULT MULTIVITAMIN W/MINERALS CH
1.0000 | ORAL_TABLET | Freq: Every day | ORAL | Status: DC
  Administered 2015-05-21 – 2015-05-23 (×3): 1 via ORAL
  Filled 2015-05-21 (×3): qty 1

## 2015-05-21 MED ORDER — FAMOTIDINE 20 MG PO TABS
10.0000 mg | ORAL_TABLET | Freq: Every day | ORAL | Status: DC
  Administered 2015-05-21 – 2015-05-22 (×2): 10 mg via ORAL
  Filled 2015-05-21 (×2): qty 1

## 2015-05-21 MED ORDER — WARFARIN SODIUM 4 MG PO TABS
4.0000 mg | ORAL_TABLET | ORAL | Status: DC
  Administered 2015-05-22: 4 mg via ORAL
  Filled 2015-05-21: qty 1

## 2015-05-21 MED ORDER — WARFARIN SODIUM 5 MG PO TABS
5.0000 mg | ORAL_TABLET | ORAL | Status: DC
  Administered 2015-05-21: 5 mg via ORAL
  Filled 2015-05-21: qty 1

## 2015-05-21 MED ORDER — METOPROLOL TARTRATE 1 MG/ML IV SOLN: 5.0000 mg | INTRAVENOUS | Status: DC | PRN

## 2015-05-21 MED ORDER — DILTIAZEM HCL 25 MG/5ML IV SOLN
10.0000 mg | Freq: Once | INTRAVENOUS | Status: AC
  Administered 2015-05-21: 10 mg via INTRAVENOUS
  Filled 2015-05-21: qty 5

## 2015-05-21 NOTE — ED Provider Notes (Signed)
[facility] Emergency Department Provider Note  ____________________________________________  Time seen: On arrival  I have reviewed the triage vital signs and the nursing notes.   HISTORY  Chief Complaint Shortness of Breath    HPI Dawn Burnett is a 81 y.o. female who presents with complaints of short as of breath has been worsening over the last 3 days. She does have a history of atrial fibrillation for which she takes Coumadin. She also has a history of CHF.She denies chest pain she does admit to occasional coughing. She denies fevers. No chills. She suspects this is related to her CHF as she has had fluid in the lungs before. She denies leg swelling or pain. No nausea no vomiting. She does have low energy and feels exhausted.     Past Medical History  Diagnosis Date  . Hypertension   . Arthritis   . GERD (gastroesophageal reflux disease)   . Hiatal hernia   . Hiatal hernia   . Thyroid disease   . CHF (congestive heart failure) ([place])   . Atrial fibrillation [facility])     Patient Active Problem List   Diagnosis Date Noted  . 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 Allergic rhinitis 12/07/2014  . Arthritis 12/07/2014  . Atrial fibrillation, controlled ([place]) 12/07/2014  . Arteriosclerosis of coronary artery 12/07/2014  . Essential (primary) hypertension 12/07/2014  . Acid reflux 12/07/2014  . History of prolonged Q-T interval on ECG 12/07/2014  . Blood glucose elevated 12/07/2014  . Adult hypothyroidism 12/07/2014  . Urinary incontinence 12/07/2014  . Neuropathy ([place]) 12/07/2014  . Adiposity 12/07/2014  . Osteopenia 12/07/2014  . HLD (hyperlipidemia) 12/07/2014  . Alteration in bowel elimination: incontinence 12/07/2014  . Hiatal hernia   . Atrial fibrillation ([place])   . Basal cell carcinoma of ear 11/14/2014  . Bladder infection, chronic 06/01/2012  . Urethral caruncle 06/01/2012  . SCC (squamous cell carcinoma), face  06/16/2011  . ABDOMINAL MASS 05/16/2009  . NONSPECIFIC ABN FINDING RAD & OTH EXAM GI TRACT 04/24/2009    Past Surgical History  Procedure Laterality Date  . Cateract extraction    . Breast surgery    . Abdominal hysterectomy    . Knee surgery    . Right total knee replacement    . Right hip surgery    . Appendectomy    . Removal of gastrointestinal stomatic  tumor of stomach  06/19/2009    Current Outpatient Rx  Name  Route  Sig  Dispense  Refill  . amoxicillin (AMOXIL) 500 MG capsule   Oral   Take 1 capsule (500 mg total) by mouth 3 (three) times daily.   30 capsule   1   . atorvastatin (LIPITOR) 20 MG tablet               . Calcium-Vitamin D 600-200 MG-UNIT per tablet   Oral   Take 600 tablets by mouth daily.         Marland Kitchen CARTIA XT 120 MG 24 hr capsule                 Dispense as written.   . celecoxib (CELEBREX) 200 MG capsule   Oral   Take 1 capsule (200 mg total) by mouth daily as needed.   30 capsule   5   . ciprofloxacin (CIPRO) 250 MG tablet   Oral   Take 1 tablet (250 mg total) by mouth 2 (two) times daily.   10 tablet  1   . COUMADIN 4 MG tablet      TAKE 1 TABLET AS DIRECTED   30 tablet   8     Dispense as written.    CYCLE FILL MEDICATION. Authorization is required f ...   . desoximetasone (TOPICORT) 0.05 % cream   Topical   Apply topically 2 (two) times daily.   30 g   3   . furosemide (LASIX) 20 MG tablet               . gabapentin (NEURONTIN) 600 MG tablet      TAKE 1 TABLET BY MOUTH 3 TIMES A DAY   90 tablet   11     Rx has expired - unused refills remain   . imipramine (TOFRANIL) 25 MG tablet               . loratadine (CLARITIN) 10 MG tablet   Oral   Take by mouth.         . metoprolol (LOPRESSOR) 100 MG tablet               . mometasone (NASONEX) 50 MCG/ACT nasal spray   Nasal   Place into the nose.         . mometasone (NASONEX) 50 MCG/ACT nasal spray   Nasal   Place 2 sprays into the  nose daily.   17 g   12   . Multiple Vitamins-Minerals (CENTRUM SILVER ULTRA WOMENS PO)   Oral   Take 1 tablet by mouth daily.         Marland Kitchen nystatin cream (MYCOSTATIN)   Topical   Apply 1 application topically 2 (two) times daily. Apply to affected area   30 g   0   . olmesartan-hydrochlorothiazide (BENICAR HCT) 20-12.5 MG per tablet   Oral   Take 20 tablets by mouth. 2-0-12.5 mg         . omeprazole (PRILOSEC) 20 MG capsule               . predniSONE (DELTASONE) 50 MG tablet               . ranitidine (ZANTAC) 150 MG tablet               . SYNTHROID 50 MCG tablet               . traMADol (ULTRAM) 50 MG tablet   Oral   Take 1 tablet (50 mg total) by mouth every 4 (four) hours as needed.   100 tablet   0     RX called in to [facility]   . TRIBENZOR 20-5-12.5 MG TABS                 Allergies Iodinated diagnostic agents; Augmentin; Codeine; Crestor; Naproxen; Prednisone; Sulfa antibiotics; Sulfacetamide sodium; and Tolectin  Family History  Problem Relation Age of Onset  . Heart disease Mother   . Hypertension Mother   . Heart attack Mother   . Stomach cancer Father   . Liver cancer Brother   . Hypertension Son   . Huntington's disease Sister   . Diabetes Sister   . Kidney disease Sister   . Congestive Heart Failure Sister   . Pneumonia Sister   . Stroke Brother   . Parkinson's disease Brother   . Hypertension Brother   . Congestive Heart Failure Brother     Social History Social History  Substance Use Topics  . Smoking status:  Never Smoker   . Smokeless tobacco: Never Used  . Alcohol Use: No    Review of Systems  Constitutional: Negative for fever. Positive for fatigue  Eyes: Negative for visual changes. ENT: Negative for sore throat Cardiovascular: Negative for chest pain. Respiratory: Positive for chest pain Gastrointestinal: Negative for abdominal pain, vomiting and diarrhea. Genitourinary: Negative for  dysuria. Musculoskeletal: Negative for back pain. Skin: Negative for rash. Neurological: Negative for headaches or focal weakness Psychiatric: No anxiety    ____________________________________________   PHYSICAL EXAM:  VITAL SIGNS: ED Triage Vitals  Enc Vitals Group     BP 05/21/15 0900 154/108 mmHg     Pulse Rate 05/21/15 0900 116     Resp --      Temp 05/21/15 0900 98.1 F (36.7 C)     Temp Source 05/21/15 0900 Oral     SpO2 05/21/15 0900 95 %     Weight 05/21/15 0900 200 lb (90.719 kg)     Height 05/21/15 0900 5\' 9"  (1.753 m)     Head Cir --      Peak Flow --      Pain Score --      Pain Loc --      Pain Edu? --      Excl. in [place]? --      Constitutional: Alert and oriented. Well appearing and in no distress. Eyes: Conjunctivae are normal.  ENT   Head: Normocephalic and atraumatic.   Mouth/Throat: Mucous membranes are moist. Cardiovascular: Tachycardia, irregularly irregular rhythm. Normal and symmetric distal pulses are present in all extremities. No murmurs, rubs, or gallops. Respiratory: Normal respiratory effort without tachypnea nor retractions. Minimal Rales bibasilarly Gastrointestinal: Soft and non-tender in all quadrants. No distention. There is no CVA tenderness. Genitourinary: deferred Musculoskeletal: Nontender with normal range of motion in all extremities. No lower extremity tenderness nor edema. Neurologic:  Normal speech and language. No gross focal neurologic deficits are appreciated. Skin:  Skin is warm, dry and intact. No rash noted. Psychiatric: Mood and affect are normal. Patient exhibits appropriate insight and judgment.  ____________________________________________    LABS (pertinent positives/negatives)  Labs Reviewed  CULTURE, BLOOD (ROUTINE X 2)  CULTURE, BLOOD (ROUTINE X 2)  CBC  COMPREHENSIVE METABOLIC PANEL  BRAIN NATRIURETIC PEPTIDE  TROPONIN I  URINALYSIS COMPLETEWITH MICROSCOPIC ([facility] ONLY)  PROTIME-INR     ____________________________________________   EKG  ED ECG REPORT I, Lavonia Drafts, the attending physician, personally viewed and interpreted this ECG.   Date: 05/21/2015  EKG Time: 9:04 AM  Rate: 117  Rhythm: atrial fibrillation, rate 117, LBBB  Axis: Normal axis  Intervals:left bundle branch block  ST&T Change: Nonspecific   ____________________________________________    RADIOLOGY I have personally reviewed any xrays that were ordered on this patient: Chest x-ray remarkable ____________________________________________   PROCEDURES  Procedure(s) performed: none  Critical Care performed: none  ____________________________________________   INITIAL IMPRESSION / ASSESSMENT AND PLAN / ED COURSE  Pertinent labs & imaging results that were available during my care of the patient were reviewed by me and considered in my medical decision making (see chart for details).  Patient's presentation most consistent with CHF versus pneumonia. We'll check labs x-rays and reevaluate  Labs and chest x-ray are unremarkable. Regardless patient is markedly short of breath especially with any ambulation. We did give Cardizem 10 mg IV which helped control her heart rate to a degree. She will require admission for further judgment. ____________________________________________   FINAL CLINICAL IMPRESSION(S) / ED DIAGNOSES  Final diagnoses:  Shortness of breath  Persistent atrial fibrillation (HCC)     Lavonia Drafts, MD 05/21/15 1145

## 2015-05-21 NOTE — Evaluation (Signed)
Physical Therapy Evaluation Patient Details Name: Dawn Burnett MRN: [id number] DOB: 01-Aug-1929 Today's Date: 05/21/2015   History of Present Illness  81 yo female with weakness and SOB was admitted, has been diagnosed with a-fib and L lung base scarring.  PMHx:  a-fib, CHF, hypothyroidism, R TKA.    Clinical Impression  Pt was seen for evaluation of her mobility upon initial eval, with increasing tolerance for mobility after sitting up bedside.  By the end of her session was up in the chair with call light, able to sit with family for dinner.  Planning in pt therapy as pt is unsafe with gait and will go home alone.    Follow Up Recommendations SNF    Equipment Recommendations  Rolling walker with 5" wheels    Recommendations for Other Services Rehab consult     Precautions / Restrictions Precautions Precautions: Fall Restrictions Weight Bearing Restrictions: No      Mobility  Bed Mobility Overal bed mobility: Needs Assistance Bed Mobility: Supine to Sit     Supine to sit: Mod assist     General bed mobility comments: using bedrails and cues for sequence  Transfers Overall transfer level: Needs assistance Equipment used: Rolling walker (2 wheeled);1 person hand held assist Transfers: Sit to/from [facility] Sit to Stand: Mod assist Stand pivot transfers: Min assist       General transfer comment: cues for hand placement and elevated surface  Ambulation/Gait Ambulation/Gait assistance: Min assist;Mod assist Ambulation Distance (Feet): 4 Feet Assistive device: Rolling walker (2 wheeled);1 person hand held assist Gait Pattern/deviations: Step-to pattern;Shuffle;Wide base of support Gait velocity: reduced Gait velocity interpretation: Below normal speed for age/gender General Gait Details: dense cues to get to chair as pt is unaware of her location to chair and did not check  Stairs            Wheelchair Mobility    Modified Rankin  (Stroke Patients Only)       Balance Overall balance assessment: Needs assistance Sitting-balance support: Feet supported Sitting balance-Leahy Scale: Fair     Standing balance support: Bilateral upper extremity supported Standing balance-Leahy Scale: Poor                               Pertinent Vitals/Pain Pain Assessment: No/denies pain    Home Living Family/patient expects to be discharged to:: Private residence Living Arrangements: Alone Available Help at Discharge: Available PRN/intermittently;Family Type of Home: House Home Access: Stairs to enter Entrance Stairs-Rails: Left Entrance Stairs-Number of Steps: 2 Home Layout: One level Home Equipment: Cane - single point;Bedside commode;Shower seat;Walker - 4 wheels      Prior Function Level of Independence: Independent with assistive device(s)         Comments: recently more weak     Hand Dominance   Dominant Hand: Right    Extremity/Trunk Assessment   Upper Extremity Assessment: Overall WFL for tasks assessed           Lower Extremity Assessment: Generalized weakness      Cervical / Trunk Assessment: Normal  Communication   Communication: HOH  Cognition Arousal/Alertness: Awake/alert Behavior During Therapy: WFL for tasks assessed/performed Overall Cognitive Status: Within Functional Limits for tasks assessed                      General Comments General comments (skin integrity, edema, etc.): Pt was seen for trip to chair and with family  present.  Pt is a fall risk and chair outfitted with alarm    Exercises        Assessment/Plan    PT Assessment Patient needs continued PT services  PT Diagnosis Difficulty walking;Generalized weakness   PT Problem List Decreased strength;Decreased range of motion;Decreased activity tolerance;Decreased balance;Decreased mobility;Decreased coordination;Decreased cognition;Decreased knowledge of use of DME;Decreased safety  awareness;Decreased knowledge of precautions;Cardiopulmonary status limiting activity;Decreased skin integrity;Obesity  PT Treatment Interventions DME instruction;Gait training;Stair training;Functional mobility training;Therapeutic activities;Therapeutic exercise;Balance training;Neuromuscular re-education;Patient/family education   PT Goals (Current goals can be found in the Care Plan section) Acute Rehab PT Goals Patient Stated Goal: to get home safely PT Goal Formulation: With patient/family Time For Goal Achievement: 06/04/15 Potential to Achieve Goals: Fair    Frequency Min 2X/week   Barriers to discharge Inaccessible home environment;Decreased caregiver support home alone in stair entry house    Co-evaluation               End of Session Equipment Utilized During Treatment: Gait belt Activity Tolerance: Patient tolerated treatment well Patient left: in chair;with call bell/phone within reach;with chair alarm set;with family/visitor present Nurse Communication: Mobility status         Time: 6060-0459 PT Time Calculation (min) (ACUTE ONLY): 27 min   Charges:   PT Evaluation $Initial PT Evaluation Tier I: 1 Procedure PT Treatments $Gait Training: 8-22 mins   PT G CodesRamond Dial 2015-06-14, 5:04 PM  Mee Hives, PT MS Acute Rehab Dept. Number: [facility] [id number] and [place] [phone]

## 2015-05-21 NOTE — ED Notes (Signed)
Ems from home for shortness of breath x 3 days. Hx of a-fib, chf

## 2015-05-21 NOTE — H&P (Signed)
[name] NAME: Dawn Burnett    MR#:  [id number]  DATE OF BIRTH:  11/12/1928  DATE OF ADMISSION:  05/21/2015  PRIMARY CARE PHYSICIAN: [name], MD   REQUESTING/REFERRING PHYSICIAN: Lavonia Drafts, MD  CHIEF COMPLAINT:   Chief Complaint  Patient presents with  . Shortness of Breath    HISTORY OF PRESENT ILLNESS:  Dawn Burnett  is a 81 y.o. female with a known history of atrial fibrillation, congestive heart failure, hiatal hernia, hypothyroidism. She presents with weakness all over and shortness of breath. Of note, she received a cortisone injection in her knee last week. She complains of weakness going on for 3 or 4 days. She's also had palpitations. She's been complaining of shortness of breath and she could not breathe lying down. She's also had some trouble swallowing bread. After steroid injections in the past, she has had similar issues with fast heart rate. In the ER she was found to have an elevated heart rate and hospitalist services were contacted for further evaluation.  PAST MEDICAL HISTORY:   Past Medical History  Diagnosis Date  . Hypertension   . Arthritis   . GERD (gastroesophageal reflux disease)   . Hiatal hernia   . Hiatal hernia   . Thyroid disease   . CHF (congestive heart failure) ([place])   . Atrial fibrillation ([place])     PAST SURGICAL HISTORY:   Past Surgical History  Procedure Laterality Date  . Cateract extraction    . Breast surgery    . Abdominal hysterectomy    . Knee surgery    . Right total knee replacement    . Right hip surgery    . Appendectomy    . 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 Smoking status: Never Smoker   . Smokeless tobacco: Never Used  . Alcohol Use: No    FAMILY HISTORY:   Family History  Problem Relation Age of Onset  . Heart disease Mother   .  Hypertension Mother   . Heart attack Mother   . Stomach cancer Father   . Liver cancer Brother   . Hypertension Son   . Huntington's disease Sister   . Diabetes Sister   . Kidney disease Sister   . Congestive Heart Failure Sister   . Pneumonia Sister   . Stroke Brother   . Parkinson's disease Brother   . Hypertension Brother   . Congestive Heart Failure Brother     DRUG ALLERGIES:   Allergies  Allergen Reactions  . Iodinated Diagnostic Agents Rash  . Augmentin [Amoxicillin-Pot Clavulanate] Other (See Comments)    Gi distress   . Codeine   . Crestor [Rosuvastatin] Other (See Comments)    Unknown  . Naproxen Other (See Comments)    unknown  . Prednisone   . Sulfa Antibiotics   . Sulfacetamide Sodium   . Tolectin [Tolmetin]     REVIEW OF SYSTEMS:  CONSTITUTIONAL: No fever, positive for sweats, positive for fatigue and weakness.  EYES: No blurred or double vision. Wears reading glasses. EARS, NOSE, AND THROAT: No tinnitus or ear pain. No sore throat. Dysphagia with red. Decreased hearing. Positive for runny nose. RESPIRATORY: No cough, positive for shortness of breath, no wheezing or hemoptysis.  CARDIOVASCULAR: No chest pain, positive for palpitations.  GASTROINTESTINAL: Positive for nausea, no vomiting, diarrhea or abdominal  pain. No blood in bowel movements. GENITOURINARY: No dysuria, hematuria.  ENDOCRINE: No polyuria, nocturia,  HEMATOLOGY: No anemia, easy bruising or bleeding SKIN: No rash or lesion. MUSCULOSKELETAL: Positive for joint pains all over   NEUROLOGIC: No tingling, numbness.  PSYCHIATRY: No anxiety or depression.   MEDICATIONS AT HOME:   Prior to Admission medications   Medication Sig Start Date End Date Taking? Authorizing Provider  atorvastatin (LIPITOR) 20 MG tablet Take 20 mg by mouth at bedtime.    Yes Historical Provider, MD  Calcium-Vitamin D 600-200 MG-UNIT per tablet Take 600 tablets by mouth daily with supper.    Yes Historical Provider,  MD  celecoxib (CELEBREX) 200 MG capsule Take 1 capsule (200 mg total) by mouth daily as needed. Patient taking differently: Take 200 mg by mouth daily.  04/12/15  Yes Joylynn Defrancesco Maceo Pro., MD  diltiazem (DILACOR XR) 120 MG 24 hr capsule Take 120 mg by mouth daily.   Yes Historical Provider, MD  furosemide (LASIX) 20 MG tablet Take 20 mg by mouth daily.    Yes Historical Provider, MD  gabapentin (NEURONTIN) 600 MG tablet TAKE 1 TABLET BY MOUTH 3 TIMES A DAY 05/14/15  Yes Jerrol Banana., MD  imipramine (TOFRANIL) 25 MG tablet Take 25 mg by mouth at bedtime.    Yes Historical Provider, MD  levothyroxine (SYNTHROID, LEVOTHROID) 50 MCG tablet Take 50 mcg by mouth daily before breakfast.   Yes Historical Provider, MD  metoprolol (LOPRESSOR) 100 MG tablet Take 100 mg by mouth 2 (two) times daily.    Yes Historical Provider, MD  Multiple Vitamins-Minerals (CENTRUM SILVER ULTRA WOMENS PO) Take 1 tablet by mouth daily with lunch.    Yes Historical Provider, MD  olmesartan-hydrochlorothiazide (BENICAR HCT) 20-12.5 MG per tablet Take 1 tablet by mouth daily.    Yes Historical Provider, MD  omeprazole (PRILOSEC) 20 MG capsule Take 20 mg by mouth 2 (two) times daily before a meal.    Yes Historical Provider, MD  ranitidine (ZANTAC) 150 MG tablet Take 150 mg by mouth 2 (two) times daily before a meal.    Yes Historical Provider, MD  warfarin (COUMADIN) 4 MG tablet Take 4 mg by mouth See admin instructions. Every evening on Monday, Wednesday, and Friday   Yes Historical Provider, MD  warfarin (COUMADIN) 5 MG tablet Take 5 mg by mouth See admin instructions. Every evening on Sunday, Tuesday, Thursday, and Saturday   Yes Historical Provider, MD  amoxicillin (AMOXIL) 500 MG capsule Take 1 capsule (500 mg total) by mouth 3 (three) times daily. Patient not taking: Reported on 05/21/2015 01/30/15   Jerrol Banana., MD  ciprofloxacin (CIPRO) 250 MG tablet Take 1 tablet (250 mg total) by mouth 2 (two) times  daily. Patient not taking: Reported on 05/21/2015 01/21/15   Jerrol Banana., MD  desoximetasone (TOPICORT) 0.05 % cream Apply topically 2 (two) times daily. 12/04/13   Max T Hyatt, DPM  mometasone (NASONEX) 50 MCG/ACT nasal spray Place 2 sprays into the nose daily. Patient not taking: Reported on 05/21/2015 01/30/15   Jerrol Banana., MD  nystatin cream (MYCOSTATIN) Apply 1 application topically 2 (two) times daily. Apply to affected area Patient not taking: Reported on 05/21/2015 01/21/15   Jerrol Banana., MD  traMADol (ULTRAM) 50 MG tablet Take 1 tablet (50 mg total) by mouth every 4 (four) hours as needed. Patient not taking: Reported on 05/21/2015 03/28/15   Jerrol Banana., MD  VITAL SIGNS:  Blood pressure 148/97, pulse 85, temperature 98.1 F (36.7 C), temperature source Oral, resp. rate 17, height 5\' 9"  (1.753 m), weight 90.719 kg (200 lb), SpO2 91 %.  PHYSICAL EXAMINATION:  GENERAL:  81 y.o.-year-old patient lying in the bed with no acute distress.  EYES: Pupils equal, round, reactive to light and accommodation. No scleral icterus. Extraocular muscles intact.  HEENT: Head atraumatic, normocephalic. Oropharynx and nasopharynx clear. Wax right ear. NECK:  Supple, no jugular venous distention. No thyroid enlargement, no tenderness.  LUNGS: Normal breath sounds bilaterally, no wheezing, positive rales in left base, no rhonchi or crepitation. No use of accessory muscles of respiration.  CARDIOVASCULAR: S1, S2 irregularly irregular tachycardic. 2/6 systolic murmurs, no rubs, or gallops.  ABDOMEN: Soft, nontender, nondistended. Bowel sounds present. No organomegaly or mass.  EXTREMITIES: Trace pedal edema, no cyanosis, or clubbing.  NEUROLOGIC: Cranial nerves II through XII are intact. Muscle strength 5/5 in all extremities. Sensation intact. Gait not checked.  PSYCHIATRIC: The patient is alert and oriented x 3.  SKIN: No rash, lesion, or ulcer.   LABORATORY  PANEL:   CBC  Recent Labs Lab 05/21/15 0916  WBC 12.5*  HGB 15.2  HCT 45.2  PLT 173   ------------------------------------------------------------------------------------------------------------------  Chemistries   Recent Labs Lab 05/21/15 0916  NA 141  K 4.3  CL 99*  CO2 31  GLUCOSE 176*  BUN 29*  CREATININE 0.96  CALCIUM 9.7  AST 22  ALT 22  ALKPHOS 56  BILITOT 1.6*   ------------------------------------------------------------------------------------------------------------------  Cardiac Enzymes  Recent Labs Lab 05/21/15 0916  TROPONINI <0.03   ------------------------------------------------------------------------------------------------------------------  RADIOLOGY:  Dg Chest Portable 1 View  05/21/2015   CLINICAL DATA:  Shortness of breath for 3 days. History of atrial fibrillation.  EXAM: PORTABLE CHEST 1 VIEW  COMPARISON:  April 18, 2014  FINDINGS: There is stable scarring in the left base. There is no edema or consolidation. Heart size and pulmonary vascularity are within normal limits. No adenopathy. No bone lesions. There is atherosclerotic calcification in the aorta. There is arthropathy in the right shoulder.  IMPRESSION: Scarring left base.  No edema or consolidation.   Electronically Signed   By: Lowella Grip III M.D.   On: 05/21/2015 09:40    EKG:   Atrial fibrillation rapid ventricular response 117 bpm, left bundle-branch blo  IMPRESSION AND PLAN:   1. Atrial fibrillation with rapid ventricular response. This could be secondary to the steroid injection last week. I'll give an extra dose of metoprolol orally. I will continue the patient on her usual medications and monitor. Will get cardiology consultation. Check orthostatic vital signs. 2. Generalized weakness- likely secondary to rapid heart rate. We'll get physical therapy evaluation. Check a TSH in the a.m. 3. History of congestive heart failure- I will continue the patient's  Lasix and metoprolol and Benicar. 4. Hypothyroidism unspecified continue levothyroxin 5. History of arthritis 6. Gastroesophageal reflux disease without esophagitis on PPI and H2 blocker 7. Impaired fasting glucose check a hemoglobin A1c.  All the records are reviewed and case discussed with ED provider. Management plans discussed with the patient, family and they are in agreement.  CODE STATUS: DO NOT RESUSCITATE  TOTAL TIME TAKING CARE OF THIS PATIENT: 55 minutes.    Loletha Grayer M.D on 05/21/2015 at 12:16 PM  Between 7am to 6pm - Pager - [phone]  After 6pm call admission pager [facility]  Office  [phone]  CC: Primary care physician; [name], MD

## 2015-05-21 NOTE — Progress Notes (Signed)
Patient alert and oriented x4. Oriented to room, unit, and call bell. Admission completed. No complaints at this time. Will cont to assess. Skin assessment verified by Mya Woodard. Telemetry box verified. Wilnette Kales

## 2015-05-21 NOTE — Consult Note (Signed)
Primary Cardiologist: Dr. Neoma Laming   Reason for Consultation : Atrial fibrillation with rapid ventricular response    HPI : This is an 81yo F well known to our practice who presented to ER today c/o fatigue/malaise for past few days. Pt has hx of resistant a-fib, multiple failed anti-arrhythmic medications and multiple attempts at [facility]. She has been rate controlled and protected with coumadin. Pt had steroid injection last week and was likely the cause of RVR. She is lying comfortably in bed with family at bedside.         Review of Systems: General: negative for chills, fever, night sweats or weight changes.  Cardiovascular: negative for chest pain, edema, orthopnea, paroxysmal nocturnal dyspnea, or dyspnea on exertion. Positive for palpitations and SOB Dermatological: negative for rash Respiratory: negative for cough or wheezing Urologic: negative for hematuria Abdominal: negative for nausea, vomiting, diarrhea, bright red blood per rectum, melena, or hematemesis Neurologic: negative for visual changes, syncope, or dizziness All other systems reviewed and are otherwise negative except as noted above.    Past Medical History  Diagnosis Date  . Hypertension   . Arthritis   . GERD (gastroesophageal reflux disease)   . Hiatal hernia   . Hiatal hernia   . Thyroid disease   . CHF (congestive heart failure) ([place])   . Atrial fibrillation ([place])     Medications Prior to Admission  Medication Sig Dispense Refill  . atorvastatin (LIPITOR) 20 MG tablet Take 20 mg by mouth at bedtime.     . Calcium-Vitamin D 600-200 MG-UNIT per tablet Take 600 tablets by mouth daily with supper.     . celecoxib (CELEBREX) 200 MG capsule Take 1 capsule (200 mg total) by mouth daily as needed. (Patient taking differently: Take 200 mg by mouth daily. ) 30 capsule 5  . diltiazem (DILACOR XR) 120 MG 24 hr capsule Take 120 mg by mouth daily.    . furosemide (LASIX) 20 MG tablet Take 20 mg by mouth  daily.     Marland Kitchen gabapentin (NEURONTIN) 600 MG tablet TAKE 1 TABLET BY MOUTH 3 TIMES A DAY 90 tablet 11  . imipramine (TOFRANIL) 25 MG tablet Take 25 mg by mouth at bedtime.     Marland Kitchen levothyroxine (SYNTHROID, LEVOTHROID) 50 MCG tablet Take 50 mcg by mouth daily before breakfast.    . metoprolol (LOPRESSOR) 100 MG tablet Take 100 mg by mouth 2 (two) times daily.     . Multiple Vitamins-Minerals (CENTRUM SILVER ULTRA WOMENS PO) Take 1 tablet by mouth daily with lunch.     . olmesartan-hydrochlorothiazide (BENICAR HCT) 20-12.5 MG per tablet Take 1 tablet by mouth daily.     Marland Kitchen omeprazole (PRILOSEC) 20 MG capsule Take 20 mg by mouth 2 (two) times daily before a meal.     . ranitidine (ZANTAC) 150 MG tablet Take 150 mg by mouth 2 (two) times daily before a meal.     . warfarin (COUMADIN) 4 MG tablet Take 4 mg by mouth See admin instructions. Every evening on Monday, Wednesday, and Friday    . warfarin (COUMADIN) 5 MG tablet Take 5 mg by mouth See admin instructions. Every evening on Sunday, Tuesday, Thursday, and Saturday    . amoxicillin (AMOXIL) 500 MG capsule Take 1 capsule (500 mg total) by mouth 3 (three) times daily. (Patient not taking: Reported on 05/21/2015) 30 capsule 1  . ciprofloxacin (CIPRO) 250 MG tablet Take 1 tablet (250 mg total) by mouth 2 (two) times daily. (Patient  not taking: Reported on 05/21/2015) 10 tablet 1  . desoximetasone (TOPICORT) 0.05 % cream Apply topically 2 (two) times daily. 30 g 3  . mometasone (NASONEX) 50 MCG/ACT nasal spray Place 2 sprays into the nose daily. (Patient not taking: Reported on 05/21/2015) 17 g 12  . nystatin cream (MYCOSTATIN) Apply 1 application topically 2 (two) times daily. Apply to affected area (Patient not taking: Reported on 05/21/2015) 30 g 0  . traMADol (ULTRAM) 50 MG tablet Take 1 tablet (50 mg total) by mouth every 4 (four) hours as needed. (Patient not taking: Reported on 05/21/2015) 100 tablet 0     . [START ON 05/22/2015] atorvastatin  20 mg  Oral QHS  . calcium carbonate  400 mg of elemental calcium Oral Q supper  . carbamide peroxide  5 drop Right Ear BID  . [START ON 05/22/2015] celecoxib  200 mg Oral Daily  . digoxin  0.25 mg Intravenous Daily  . [START ON 05/22/2015] diltiazem  120 mg Oral Daily  . famotidine  10 mg Oral QHS  . furosemide  20 mg Oral Daily  . gabapentin  600 mg Oral TID  . [START ON 05/22/2015] hydrochlorothiazide  12.5 mg Oral Daily  . imipramine  25 mg Oral QHS  . [START ON 05/22/2015] Influenza vac split quadrivalent PF  0.5 mL Intramuscular Tomorrow-1000  . [START ON 05/22/2015] irbesartan  150 mg Oral Daily  . levothyroxine  50 mcg Oral QAC breakfast  . metoprolol  100 mg Oral BID  . multivitamin with minerals  1 tablet Oral Q lunch  . pantoprazole  40 mg Oral BID  . sodium chloride  3 mL Intravenous Q12H  . [START ON 05/22/2015] warfarin  4 mg Oral Once per day on Mon Wed Fri  . warfarin  5 mg Oral Once per day on Sun Tue Thu Sat  . Warfarin - Physician Dosing Inpatient   Does not apply q1800    Infusions:    Allergies  Allergen Reactions  . Iodinated Diagnostic Agents Rash  . Augmentin [Amoxicillin-Pot Clavulanate] Other (See Comments)    Gi distress   . Codeine   . Crestor [Rosuvastatin] Other (See Comments)    Unknown  . Naproxen Other (See Comments)    unknown  . Prednisone   . Sulfa Antibiotics   . Sulfacetamide Sodium   . Tolectin [Tolmetin]     Social History   Social History  . Marital Status: Widowed    Spouse Name: widowed  . Number of Children: 2  . Years of Education: 12   Occupational History  . retired    Social History Main Topics  . Smoking status: Never Smoker   . Smokeless tobacco: Never Used  . Alcohol Use: No  . Drug Use: No  . Sexual Activity: No   Other Topics Concern  . Not on file   Social History Narrative    Family History  Problem Relation Age of Onset  . Heart disease Mother   . Hypertension Mother   . Heart attack Mother   .  Stomach cancer Father   . Liver cancer Brother   . Hypertension Son   . Huntington's disease Sister   . Diabetes Sister   . Kidney disease Sister   . Congestive Heart Failure Sister   . Pneumonia Sister   . Stroke Brother   . Parkinson's disease Brother   . Hypertension Brother   . Congestive Heart Failure Brother     PHYSICAL EXAM: Filed Vitals:  05/21/15 1531  BP: 108/58  Pulse: 114  Temp:   Resp:     No intake or output data in the 24 hours ending 05/21/15 1615  General:  Well appearing. No respiratory difficulty HEENT: normal Neck: supple. no JVD. Carotids 2+ bilat; no bruits. No lymphadenopathy or thryomegaly appreciated. Cor: PMI nondisplaced. Regular rate & rhythm. No rubs, gallops or murmurs. Lungs: clear Abdomen: soft, nontender, nondistended. No hepatosplenomegaly. No bruits or masses. Good bowel sounds. Extremities: no cyanosis, clubbing, rash, edema Neuro: alert & oriented x 3, cranial nerves grossly intact. moves all 4 extremities w/o difficulty. Affect pleasant.  ECG: a-fib 117 VR LBBB  Results for orders placed or performed during the hospital encounter of 05/21/15 (from the past 24 hour(s))  CBC     Status: Abnormal   Collection Time: 05/21/15  9:16 AM  Result Value Ref Range   WBC 12.5 (H) 3.6 - 11.0 K/uL   RBC 4.56 3.80 - 5.20 MIL/uL   Hemoglobin 15.2 12.0 - 16.0 g/dL   HCT 45.2 35.0 - 47.0 %   MCV 99.0 80.0 - 100.0 fL   MCH 33.4 26.0 - 34.0 pg   MCHC 33.8 32.0 - 36.0 g/dL   RDW 14.4 11.5 - 14.5 %   Platelets 173 150 - 440 K/uL  Comprehensive metabolic panel     Status: Abnormal   Collection Time: 05/21/15  9:16 AM  Result Value Ref Range   Sodium 141 135 - 145 mmol/L   Potassium 4.3 3.5 - 5.1 mmol/L   Chloride 99 (L) 101 - 111 mmol/L   CO2 31 22 - 32 mmol/L   Glucose, Bld 176 (H) 65 - 99 mg/dL   BUN 29 (H) 6 - 20 mg/dL   Creatinine, Ser 0.96 0.44 - 1.00 mg/dL   Calcium 9.7 8.9 - 10.3 mg/dL   Total Protein 7.2 6.5 - 8.1 g/dL   Albumin  4.2 3.5 - 5.0 g/dL   AST 22 15 - 41 U/L   ALT 22 14 - 54 U/L   Alkaline Phosphatase 56 38 - 126 U/L   Total Bilirubin 1.6 (H) 0.3 - 1.2 mg/dL   GFR calc non Af Amer 53 (L) >60 mL/min   GFR calc Af Amer >60 >60 mL/min   Anion gap 11 5 - 15  Brain natriuretic peptide     Status: Abnormal   Collection Time: 05/21/15  9:16 AM  Result Value Ref Range   B Natriuretic Peptide 372.0 (H) 0.0 - 100.0 pg/mL  Troponin I     Status: None   Collection Time: 05/21/15  9:16 AM  Result Value Ref Range   Troponin I <0.03 <0.031 ng/mL  Urinalysis complete, with microscopic ([facility] only)     Status: Abnormal   Collection Time: 05/21/15  9:16 AM  Result Value Ref Range   Color, Urine YELLOW (A) YELLOW   APPearance CLEAR (A) CLEAR   Glucose, UA NEGATIVE NEGATIVE mg/dL   Bilirubin Urine NEGATIVE NEGATIVE   Ketones, ur NEGATIVE NEGATIVE mg/dL   Specific Gravity, Urine 1.009 1.005 - 1.030   Hgb urine dipstick NEGATIVE NEGATIVE   pH 7.0 5.0 - 8.0   Protein, ur NEGATIVE NEGATIVE mg/dL   Nitrite NEGATIVE NEGATIVE   Leukocytes, UA 1+ (A) NEGATIVE   RBC / HPF 0-5 0 - 5 RBC/hpf   WBC, UA 6-30 0 - 5 WBC/hpf   Bacteria, UA RARE (A) NONE SEEN   Squamous Epithelial / LPF 0-5 (A) NONE SEEN   Hyaline Casts,  UA PRESENT   Protime-INR     Status: Abnormal   Collection Time: 05/21/15  9:16 AM  Result Value Ref Range   Prothrombin Time 23.1 (H) 11.4 - 15.0 seconds   INR 2.03   TSH     Status: None   Collection Time: 05/21/15  9:16 AM  Result Value Ref Range   TSH 2.701 0.350 - 4.500 uIU/mL   Dg Chest Portable 1 View  05/21/2015   CLINICAL DATA:  Shortness of breath for 3 days. History of atrial fibrillation.  EXAM: PORTABLE CHEST 1 VIEW  COMPARISON:  April 18, 2014  FINDINGS: There is stable scarring in the left base. There is no edema or consolidation. Heart size and pulmonary vascularity are within normal limits. No adenopathy. No bone lesions. There is atherosclerotic calcification in the aorta. There is  arthropathy in the right shoulder.  IMPRESSION: Scarring left base.  No edema or consolidation.   Electronically Signed   By: Lowella Grip III M.D.   On: 05/21/2015 09:40     ASSESSMENT: Atrial Fibrillation with RVR   PLAN/DISCUSSION: Likely 2/2 steroid injection. Echo shows EF 45%, unchanged from last echo in office earlier this year. Pt has failed amiodarone therapy, BP too low to increase Bb or CCB. Will add digoxin 0.25mg  IV once today and continue to follow.   Patient and plan discussed with supervising provider, Dr. Neoma Laming, who agrees with above findings.   Kelby Fam Garey, [place] 05/21/2015 4:15 PM

## 2015-05-21 NOTE — Progress Notes (Signed)
*  PRELIMINARY RESULTS* Echocardiogram 2D Echocardiogram has been performed.  Dawn Burnett 05/21/2015, 3:14 PM

## 2015-05-22 DIAGNOSIS — I4891 Unspecified atrial fibrillation: Secondary | ICD-10-CM | POA: Diagnosis not present

## 2015-05-22 LAB — BASIC METABOLIC PANEL
Anion gap: 11 (ref 5–15)
BUN: 28 mg/dL — ABNORMAL HIGH (ref 6–20)
CO2: 31 mmol/L (ref 22–32)
Calcium: 9.3 mg/dL (ref 8.9–10.3)
Chloride: 101 mmol/L (ref 101–111)
Creatinine, Ser: 0.95 mg/dL (ref 0.44–1.00)
GFR calc Af Amer: >60 mL/min (ref >60)
GFR calc non Af Amer: 53 mL/min — ABNORMAL LOW (ref >60)
Glucose, Bld: 156 mg/dL — ABNORMAL HIGH (ref 65–99)
Potassium: 3.6 mmol/L (ref 3.5–5.1)
Sodium: 143 mmol/L (ref 135–145)

## 2015-05-22 LAB — CBC
HCT: 43.8 % (ref 35.0–47.0)
Hemoglobin: 15 g/dL (ref 12.0–16.0)
MCH: 33.9 pg (ref 26.0–34.0)
MCHC: 34.2 g/dL (ref 32.0–36.0)
MCV: 98.9 fL (ref 80.0–100.0)
Platelets: 130 10*3/uL — ABNORMAL LOW (ref 150–440)
RBC: 4.43 MIL/uL (ref 3.80–5.20)
RDW: 14.4 % (ref 11.5–14.5)
WBC: 8.2 10*3/uL (ref 3.6–11.0)

## 2015-05-22 LAB — PROTIME-INR
INR: 1.94
Prothrombin Time: 22.3 seconds — ABNORMAL HIGH (ref 11.4–15.0)

## 2015-05-22 LAB — HEMOGLOBIN A1C: Hgb A1c MFr Bld: 6.6 % — ABNORMAL HIGH (ref 4.0–6.0)

## 2015-05-22 MED ORDER — METFORMIN HCL 500 MG PO TABS
500.0000 mg | ORAL_TABLET | Freq: Two times a day (BID) | ORAL | Status: DC
  Administered 2015-05-22 – 2015-05-23 (×2): 500 mg via ORAL
  Filled 2015-05-22 (×2): qty 1

## 2015-05-22 MED ORDER — POTASSIUM CHLORIDE CRYS ER 20 MEQ PO TBCR
40.0000 meq | EXTENDED_RELEASE_TABLET | Freq: Once | ORAL | Status: AC
  Administered 2015-05-22: 40 meq via ORAL
  Filled 2015-05-22: qty 2

## 2015-05-22 NOTE — Progress Notes (Signed)
[facility] [place] NAME: Dawn Burnett    MR#:  [id number]  DATE OF BIRTH:  May 21, 1929  SUBJECTIVE:  CHIEF COMPLAINT:   Chief Complaint  Patient presents with  . Shortness of Breath  Admitted for weakness, SOB, palpitations. Some improvement today. Tele still shows Afib. Started on Digoxin in hospital  REVIEW OF SYSTEMS:    Review of Systems  Constitutional: Positive for malaise/fatigue. Negative for fever and chills.  HENT: Negative for sore throat.   Eyes: Negative for blurred vision, double vision and pain.  Respiratory: Negative for cough, hemoptysis, shortness of breath and wheezing.   Cardiovascular: Positive for palpitations. Negative for chest pain, orthopnea and leg swelling.  Gastrointestinal: Negative for heartburn, nausea, vomiting, abdominal pain, diarrhea and constipation.  Genitourinary: Negative for dysuria and hematuria.  Musculoskeletal: Negative for back pain and joint pain.  Skin: Negative for rash.  Neurological: Positive for dizziness and weakness. Negative for sensory change, speech change, focal weakness and headaches.  Endo/Heme/Allergies: Does not bruise/bleed easily.  Psychiatric/Behavioral: Negative for depression. The patient is not nervous/anxious.       DRUG ALLERGIES:   Allergies  Allergen Reactions  . Iodinated Diagnostic Agents Rash  . Augmentin [Amoxicillin-Pot Clavulanate] Other (See Comments)    Gi distress   . Codeine   . Crestor [Rosuvastatin] Other (See Comments)    Unknown  . Naproxen Other (See Comments)    unknown  . Prednisone   . Sulfa Antibiotics   . Sulfacetamide Sodium   . Tolectin [Tolmetin]     VITALS:  Blood pressure 110/60, pulse 106, temperature 98 F (36.7 C), temperature source Oral, resp. rate 18, height 5\' 9"  (1.753 m), weight 84.913 kg (187 lb 3.2 oz), SpO2 94 %.  PHYSICAL EXAMINATION:   Physical Exam  GENERAL:  81 y.o.-year-old patient lying in the  bed with no acute distress.  EYES: Pupils equal, round, reactive to light and accommodation. No scleral icterus. Extraocular muscles intact.  HEENT: Head atraumatic, normocephalic. Oropharynx and nasopharynx clear.  NECK:  Supple, no jugular venous distention. No thyroid enlargement, no tenderness.  LUNGS: Normal breath sounds bilaterally, no wheezing, rales, rhonchi. No use of accessory muscles of respiration.  CARDIOVASCULAR: S1, S2 normal. No murmurs, rubs, or gallops.  ABDOMEN: Soft, nontender, nondistended. Bowel sounds present. No organomegaly or mass.  EXTREMITIES: No cyanosis, clubbing or edema b/l.    NEUROLOGIC: Cranial nerves II through XII are intact. No focal Motor or sensory deficits b/l.   PSYCHIATRIC: The patient is alert and oriented x 3.  SKIN: No obvious rash, lesion, or ulcer.    LABORATORY PANEL:   CBC  Recent Labs Lab 05/22/15 0425  WBC 8.2  HGB 15.0  HCT 43.8  PLT 130*   ------------------------------------------------------------------------------------------------------------------  Chemistries   Recent Labs Lab 05/21/15 0916 05/22/15 0425  NA 141 143  K 4.3 3.6  CL 99* 101  CO2 31 31  GLUCOSE 176* 156*  BUN 29* 28*  CREATININE 0.96 0.95  CALCIUM 9.7 9.3  AST 22  --   ALT 22  --   ALKPHOS 56  --   BILITOT 1.6*  --    ------------------------------------------------------------------------------------------------------------------  Cardiac Enzymes  Recent Labs Lab 05/21/15 2029  TROPONINI <0.03   ------------------------------------------------------------------------------------------------------------------  RADIOLOGY:  Dg Chest Portable 1 View  05/21/2015  CLINICAL DATA:  Shortness of breath for 3 days. History of atrial fibrillation. EXAM: PORTABLE CHEST 1 VIEW COMPARISON:  April 18, 2014 FINDINGS: There is  stable scarring in the left base. There is no edema or consolidation. Heart size and pulmonary vascularity are within  normal limits. No adenopathy. No bone lesions. There is atherosclerotic calcification in the aorta. There is arthropathy in the right shoulder. IMPRESSION: Scarring left base.  No edema or consolidation. Electronically Signed   By: Lowella Grip III M.D.   On: 05/21/2015 09:40     ASSESSMENT AND PLAN:    1. Atrial fibrillation with rapid ventricular response - Improving Appreciate cardiology help. On cardizem. Digoxin started.  2. Generalized weakness- likely secondary to rapid heart rate.  Improved today PT recommended SNF but patient wants to to go home  3. History of congestive heart failure- I will continue the patient's Lasix and metoprolol and Benicar.  4. Hypothyroidism unspecified continue levothyroxin  5. History of arthritis  6. Gastroesophageal reflux disease without esophagitis on PPI and H2 blocker  7. Impaired fasting glucose HbA1c at 6.6 Has had elevated BS in the past too Start low dose metformin at 500mg  BID  All the records are reviewed and case discussed with Care Management/Social Workerr. Management plans discussed with the patient, family and they are in agreement.  CODE STATUS: DNR  DVT Prophylaxis: SCDs  TOTAL TIME TAKING CARE OF THIS PATIENT: 35 minutes.   POSSIBLE D/C IN 1-2 DAYS, DEPENDING ON CLINICAL CONDITION.  PT has recommended SNF. I discussed with Patient and Son at bedside. They both would like discharge home with [facility] PT. NO SNF. I have ordered [place] PT, RN, Aide along with Face-to Face. Discussed with SW and CM.   Hillary Bow R M.D on 05/22/2015 at 2:44 PM  Between 7am to 6pm - Pager - [phone]  After 6pm go to [email or web address] - password [email or web address] [facility]  Office  [phone]  CC: Primary care physician; [name], MD    Note: This dictation was prepared with Dragon dictation along with smaller phrase technology. Any transcriptional errors that result from this process are  unintentional.

## 2015-05-22 NOTE — Care Management Important Message (Signed)
Important Message  Patient Details  Name: MICHALENE DEBRULER MRN: [id number] Date of Birth: [birth year]   Medicare Important Message Given:  Yes-second notification given    Katrina Stack, RN 05/22/2015, 2:27 PM

## 2015-05-22 NOTE — Progress Notes (Signed)
Physical Therapy Treatment Patient Details Name: Dawn Burnett MRN: [id number] DOB: May 11, 1929 Today's Date: 05/22/2015    History of Present Illness 81 yo female with weakness and SOB was admitted, has been diagnosed with a-fib and L lung base scarring.  PMHx:  a-fib, CHF, hypothyroidism, R TKA.      PT Comments    Pt was able to maneuver more comfortably today, demonstrating potential for her recovery in SNF.  Her plan is to go home asap, and will need short stay only to increase independence with more strenuous activities such as getting OOB and standing from low heights.  Follow Up Recommendations  SNF     Equipment Recommendations  Rolling walker with 5" wheels    Recommendations for Other Services Rehab consult     Precautions / Restrictions Precautions Precautions: Fall Restrictions Weight Bearing Restrictions: No    Mobility  Bed Mobility Overal bed mobility: Needs Assistance Bed Mobility: Supine to Sit     Supine to sit: Mod assist     General bed mobility comments: using bedrails and cues for sequence  Transfers Overall transfer level: Needs assistance Equipment used: Rolling walker (2 wheeled);1 person hand held assist Transfers: Sit to/from Stand;Stand Pivot Transfers Sit to Stand: Min assist;From elevated surface Stand pivot transfers: Min assist       General transfer comment: cues for hand placement and elevated surface  Ambulation/Gait Ambulation/Gait assistance: Min assist Ambulation Distance (Feet): 100 Feet Assistive device: Rolling walker (2 wheeled);1 person hand held assist Gait Pattern/deviations: Wide base of support;Trunk flexed;Decreased dorsiflexion - right;Decreased dorsiflexion - left;Decreased stride length Gait velocity: reduced Gait velocity interpretation: Below normal speed for age/gender General Gait Details: dense cues to get to chair as pt is unaware of her location to chair and did not check   Stairs             Wheelchair Mobility    Modified Rankin (Stroke Patients Only)       Balance Overall balance assessment: Needs assistance Sitting-balance support: Feet supported Sitting balance-Leahy Scale: Good   Postural control: Posterior lean Standing balance support: Bilateral upper extremity supported Standing balance-Leahy Scale: Fair Standing balance comment: RW for support                    Cognition Arousal/Alertness: Awake/alert Behavior During Therapy: WFL for tasks assessed/performed Overall Cognitive Status: Within Functional Limits for tasks assessed                      Exercises General Exercises - Lower Extremity Ankle Circles/Pumps: AROM;Both;10 reps Long Arc Quad: Strengthening;Both;10 reps Heel Slides: Both;10 reps;Strengthening Hip ABduction/ADduction: Strengthening;Both;10 reps    General Comments General comments (skin integrity, edema, etc.): Pt was able to walk farther and witth less assistance today, will be expecting to go to SNF for rehab as she is home alone and not able to maneuver without significant help.      Pertinent Vitals/Pain Pain Assessment: No/denies pain    Home Living                      Prior Function            PT Goals (current goals can now be found in the care plan section) Acute Rehab PT Goals Patient Stated Goal: go home asap PT Goal Formulation: With patient/family Progress towards PT goals: Progressing toward goals    Frequency  Min 2X/week    PT Plan Current plan  remains appropriate    Co-evaluation             End of Session Equipment Utilized During Treatment: Gait belt Activity Tolerance: Patient tolerated treatment well Patient left: in chair;with call bell/phone within reach;with family/visitor present     Time: 1201-1226 PT Time Calculation (min) (ACUTE ONLY): 25 min  Charges:  $Gait Training: 8-22 mins $Therapeutic Exercise: 8-22 mins                    G Codes:       Ramond Dial 06/09/2015, 12:50 PM   Mee Hives, PT MS Acute Rehab Dept. Number: [phone] [phone] and [place] [phone]

## 2015-05-22 NOTE — Care Management (Signed)
Patient presents from home.  Lives independently.  Denies issues with transportation, accessing medial care or obtaining meds.  Physical therapy is recommending skilled nursing placement but at present the patient declines.  She is agreeable to home health services.  Has received home health services through [facility] in the past and this is her agency preference.  Discussed nursing - possibly aide- and physical therapy services with attending.  Confirmed patient's contact and is current with her PCP- Dr Thurston Hole.  Family members live close by.  Referral called to [place].  Requested initial visit within 24 hours of discharge.

## 2015-05-22 NOTE — Clinical Social Work Note (Signed)
Clinical Social Work Assessment  Patient Details  Name: Dawn Burnett MRN: [id number] Date of Birth: 07-Nov-1928  Date of referral:  05/22/15               Reason for consult:  Facility Placement, Discharge Planning                Permission sought to share information with:  Facility Sport and exercise psychologist, Family Supports Permission granted to share information::  Yes, Verbal Permission Granted  Name::      (  start bed search and son  Dawn Burnett)  Agency::   (ref. bed search)  Relationship::   (son Dawn Burnett  [phone])  Contact Information:     Housing/Transportation Living arrangements for the past 2 months:  [place] of Information:  Patient, Adult Children Patient Interpreter Needed:  None Criminal Activity/Legal Involvement Pertinent to Current Situation/Hospitalization:  No - Comment as needed Significant Relationships:  Adult Children, Other Family Members Lives with:  Self Do you feel safe going back to the place where you live?  Yes Need for family participation in patient care:  Yes (Comment) (per patient family is involved in her care)  Care giving concerns:  None reported   Social Worker assessment / plan:  Holiday representative (CSW) in to assess patient for ongoing and disposition needs. Patient is engaged in conversation with CSW. Patient's daughter at bedside, patient informed CSW ok to talk while daughter is in the room.   Patient currently lives alone however states son and daughter live on each side of her and niece lives across the street.  They check on her several times a day.    Patient mostly ambulates with her walker when at home.  Per patient she went to rehab several years ago and had a negative experience.  Patient states she wants to discharge home and is willing to go with PT.        Patient evaluated by PT with recommendation for STR.  Patient is not in agreement with recommendation at this time but did ok for CSW to start a bed search.   CSW reviewed SNF process with patient, . Patient is interested in [facility] if she decides to go.      CSW will complete FL2, place copy on chart and fax out for available bed.  Informed RN Care Manager that patient maybe interested in [facility].  We will both continue to follow her until discharge.   Employment status:    Forensic scientist:  Managed Care PT Recommendations:  [place] / Referral to community resources:  [facility]  Patient/Family's Response to care:    Patient/Family's Understanding of and Emotional Response to Diagnosis, Current Treatment, and Prognosis:  Patient understands she is under continued workup with MD, she will decide on SNF verses Home PT.  Emotional Assessment Appearance:  Appears stated age Attitude/Demeanor/Rapport:    Affect (typically observed):  In denial, Pleasant, Appropriate Orientation:  Oriented to Self, Oriented to Place, Oriented to  Time, Oriented to Situation Alcohol / Substance use:  Never Used Psych involvement (Current and /or in the community):  No (Comment)  Discharge Needs  Concerns to be addressed:  Discharge Planning Concerns, Home Safety Concerns Readmission within the last 30 days:  No Current discharge risk:  Chronically ill, Dependent with Mobility, Lives alone Barriers to Discharge:  No Barriers Identified, Continued Medical Work up   [employer], LCSW 05/22/2015, 1:03 PM Casimer Lanius. LCSWA,  MSW Clinical Social Work Department Emergency Room [phone] 1:11 PM

## 2015-05-22 NOTE — Progress Notes (Signed)
    SUBJECTIVE: Pt states she is feeling better this morning, still c/o mild fatigue/malaise. No further palpitations or SOB.   Filed Vitals:   05/21/15 1955 05/22/15 0035 05/22/15 0449 05/22/15 0939  BP: 139/92 126/59 134/93 112/69  Pulse: 113 33 91 109  Temp: 98.1 F (36.7 C) 97.9 F (36.6 C) 98 F (36.7 C)   TempSrc: Oral Oral Oral   Resp: 18 18 18    Height:      Weight:      SpO2: 93% 93% 91%     Intake/Output Summary (Last 24 hours) at 05/22/15 1031 Last data filed at 05/22/15 0744  Gross per 24 hour  Intake    480 ml  Output      1 ml  Net    479 ml    LABS: Basic Metabolic Panel:  Recent Labs  05/21/15 0916 05/22/15 0425  NA 141 143  K 4.3 3.6  CL 99* 101  CO2 31 31  GLUCOSE 176* 156*  BUN 29* 28*  CREATININE 0.96 0.95  CALCIUM 9.7 9.3   Liver Function Tests:  Recent Labs  05/21/15 0916  AST 22  ALT 22  ALKPHOS 56  BILITOT 1.6*  PROT 7.2  ALBUMIN 4.2   No results for input(s): LIPASE, AMYLASE in the last 72 hours. CBC:  Recent Labs  05/21/15 0916 05/22/15 0425  WBC 12.5* 8.2  HGB 15.2 15.0  HCT 45.2 43.8  MCV 99.0 98.9  PLT 173 130*   Cardiac Enzymes:  Recent Labs  05/21/15 0916 05/21/15 1634 05/21/15 2029  TROPONINI <0.03 <0.03 <0.03   BNP: Invalid input(s): POCBNP D-Dimer: No results for input(s): DDIMER in the last 72 hours. Hemoglobin A1C:  Recent Labs  05/21/15 0916  HGBA1C 6.6*   Fasting Lipid Panel: No results for input(s): CHOL, HDL, LDLCALC, TRIG, CHOLHDL, LDLDIRECT in the last 72 hours. Thyroid Function Tests:  Recent Labs  05/21/15 0916  TSH 2.701   Anemia Panel: No results for input(s): VITAMINB12, FOLATE, FERRITIN, TIBC, IRON, RETICCTPCT in the last 72 hours.   PHYSICAL EXAM General: Well developed, well nourished, in no acute distress HEENT:  Normocephalic and atramatic Neck:  No JVD.  Lungs: Clear bilaterally to auscultation and percussion. Heart: IRIR Abdomen: Bowel sounds are positive,  abdomen soft and non-tender  Msk:  Back normal, normal gait. Normal strength and tone for age. Extremities: No clubbing, cyanosis or edema.   Neuro: Alert and oriented X 3. Psych:  Good affect, responds appropriately  TELEMETRY: Reviewed telemetry pt in a-fib VR in 90s  ASSESSMENT AND PLAN:  A-fib with RVR: Likely 2/2 steroid injection. Echo shows EF 45%, unchanged from last echo in office earlier this year. Pt has failed amiodarone therapy, Continue Bb, CCB, and digoxin 0.25mg  IV. Will continue to follow.    Patient and plan discussed with supervising provider, Dr. Neoma Laming, who agrees with above findings.   Kelby Fam [place], [place]  05/22/2015 10:31 AM

## 2015-05-22 NOTE — Progress Notes (Signed)
Patient alert and oriented x4, no complaints at this time. vss at this time. Patient afib on telemetry. Will continue to assess. Dawn Burnett R [place]   

## 2015-05-23 ENCOUNTER — Encounter: Payer: Self-pay | Admitting: *Deleted

## 2015-05-23 LAB — CBC
HCT: 42.7 % (ref 35.0–47.0)
Hemoglobin: 14.7 g/dL (ref 12.0–16.0)
MCH: 34.1 pg — ABNORMAL HIGH (ref 26.0–34.0)
MCHC: 34.4 g/dL (ref 32.0–36.0)
MCV: 99.1 fL (ref 80.0–100.0)
Platelets: 150 10*3/uL (ref 150–440)
RBC: 4.31 MIL/uL (ref 3.80–5.20)
RDW: 14.4 % (ref 11.5–14.5)
WBC: 10.2 10*3/uL (ref 3.6–11.0)

## 2015-05-23 LAB — PROTIME-INR
INR: 2.02
Prothrombin Time: 23 seconds — ABNORMAL HIGH (ref 11.4–15.0)

## 2015-05-23 MED ORDER — METFORMIN HCL 500 MG PO TABS: 500.0000 mg | ORAL_TABLET | Freq: Two times a day (BID) | ORAL | Status: DC

## 2015-05-23 MED ORDER — FUROSEMIDE 40 MG PO TABS: 40.0000 mg | ORAL_TABLET | Freq: Every day | ORAL | Status: DC

## 2015-05-23 MED ORDER — DIGOXIN 125 MCG PO TABS: 0.1250 mg | ORAL_TABLET | Freq: Every day | ORAL | Status: DC

## 2015-05-23 MED ORDER — PANTOPRAZOLE SODIUM 40 MG PO TBEC: 40.0000 mg | DELAYED_RELEASE_TABLET | Freq: Two times a day (BID) | ORAL | Status: DC

## 2015-05-23 MED ORDER — HYDROCHLOROTHIAZIDE 12.5 MG PO CAPS: 12.5000 mg | ORAL_CAPSULE | Freq: Every day | ORAL | Status: DC

## 2015-05-23 NOTE — Discharge Summary (Signed)
[facility] NAME: Dawn Burnett    MR#:  [id number]  DATE OF BIRTH:  01-29-1929  DATE OF ADMISSION:  05/21/2015 ADMITTING PHYSICIAN: Loletha Grayer, MD  DATE OF DISCHARGE: 05/23/2015 PRIMARY CARE PHYSICIAN: [name], MD    ADMISSION DIAGNOSIS:  Shortness of breath [R06.02] Persistent atrial fibrillation (HCC) [I48.1]  DISCHARGE DIAGNOSIS:  Active Problems:   Atrial fibrillation with RVR ([place])  impaired fasting glucose  SECONDARY DIAGNOSIS:   Past Medical History  Diagnosis Date  . Hypertension   . Arthritis   . GERD (gastroesophageal reflux disease)   . Hiatal hernia   . Thyroid disease   . CHF (congestive heart failure) ([place])   . Atrial fibrillation ([place])     HOSPITAL COURSE:   1.Atrial fibrillation with rapid ventricular response - Improved Appreciate cardiology help. Continue metoprolol and cardizem. Digoxin started Continue Coumadin and check repeat PT INR in a.m. and further management of the Coumadin by primary care physician at the facility  2. Generalized weakness- likely secondary to rapid heart rate.  PT recommended SNF , patient changes her mind to go to skilled nursing facility. Patient will be discharged to [facility] today  3. History of congestive heart failure- I will continue the patient's Lasix and metoprolol and Benicar.  4. Hypothyroidism unspecified continue levothyroxin  5. History of arthritis  6. Gastroesophageal reflux disease without esophagitis on PPI and H2 blocker  7. Impaired fasting glucose HbA1c at 6.6 Has had elevated BS in the past too Started patient on low dose metformin at 500mg  BID  All the records are reviewed and case discussed with Care Management/Social Workerr. Management plans discussed with the patient, family and they are in agreement.  CODE STATUS: DNR  DVT Prophylaxis: SCDs  TOTAL TIME TAKING CARE OF THIS PATIENT: 35 minutes.    DISCHARGE CONDITIONS:   Fair  CONSULTS OBTAINED:  Treatment Team:  Barnabas Harries, [place], MD Nicholes Mango, MD   PROCEDURES none  DRUG ALLERGIES:   Allergies  Allergen Reactions  . Iodinated Diagnostic Agents Rash  . Augmentin [Amoxicillin-Pot Clavulanate] Other (See Comments)    Gi distress   . Codeine   . Crestor [Rosuvastatin] Other (See Comments)    Unknown  . Naproxen Other (See Comments)    unknown  . Prednisone   . Sulfa Antibiotics   . Sulfacetamide Sodium   . Tolectin [Tolmetin]     DISCHARGE MEDICATIONS:   Current Discharge Medication List    START taking these medications   Details  digoxin (LANOXIN) 0.125 MG tablet Take 1 tablet (0.125 mg total) by mouth daily. Qty: 30 tablet, Refills: 0    hydrochlorothiazide (MICROZIDE) 12.5 MG capsule Take 1 capsule (12.5 mg total) by mouth daily. Qty: 30 capsule, Refills: 0    metFORMIN (GLUCOPHAGE) 500 MG tablet Take 1 tablet (500 mg total) by mouth 2 (two) times daily with a meal. Qty: 60 tablet, Refills: 0      CONTINUE these medications which have NOT CHANGED   Details  atorvastatin (LIPITOR) 20 MG tablet Take 20 mg by mouth at bedtime.     Calcium-Vitamin D 600-200 MG-UNIT per tablet Take 600 tablets by mouth daily with supper.     celecoxib (CELEBREX) 200 MG capsule Take 1 capsule (200 mg total) by mouth daily as needed. Qty: 30 capsule, Refills: 5    diltiazem (DILACOR XR) 120 MG 24 hr capsule Take 120 mg by mouth  daily.    furosemide (LASIX) 20 MG tablet Take 20 mg by mouth daily.     gabapentin (NEURONTIN) 600 MG tablet TAKE 1 TABLET BY MOUTH 3 TIMES A DAY Qty: 90 tablet, Refills: 11    imipramine (TOFRANIL) 25 MG tablet Take 25 mg by mouth at bedtime.     levothyroxine (SYNTHROID, LEVOTHROID) 50 MCG tablet Take 50 mcg by mouth daily before breakfast.    metoprolol (LOPRESSOR) 100 MG tablet Take 100 mg by mouth 2 (two) times daily.     Multiple Vitamins-Minerals (CENTRUM  SILVER ULTRA WOMENS PO) Take 1 tablet by mouth daily with lunch.     olmesartan-hydrochlorothiazide (BENICAR HCT) 20-12.5 MG per tablet Take 1 tablet by mouth daily.     omeprazole (PRILOSEC) 20 MG capsule Take 20 mg by mouth 2 (two) times daily before a meal.     ranitidine (ZANTAC) 150 MG tablet Take 150 mg by mouth 2 (two) times daily before a meal.     !! warfarin (COUMADIN) 4 MG tablet Take 4 mg by mouth See admin instructions. Every evening on Monday, Wednesday, and Friday    !! warfarin (COUMADIN) 5 MG tablet Take 5 mg by mouth See admin instructions. Every evening on Sunday, Tuesday, Thursday, and Saturday    desoximetasone (TOPICORT) 0.05 % cream Apply topically 2 (two) times daily. Qty: 30 g, Refills: 3    mometasone (NASONEX) 50 MCG/ACT nasal spray Place 2 sprays into the nose daily. Qty: 17 g, Refills: 12    traMADol (ULTRAM) 50 MG tablet Take 1 tablet (50 mg total) by mouth every 4 (four) hours as needed. Qty: 100 tablet, Refills: 0     !! - Potential duplicate medications found. Please discuss with provider.    STOP taking these medications     amoxicillin (AMOXIL) 500 MG capsule      ciprofloxacin (CIPRO) 250 MG tablet      nystatin cream (MYCOSTATIN)          DISCHARGE INSTRUCTIONS:  Activity as recommended by physical therapy  Diet healthy heart, diabetic  Follow-up with primary care physician at the facility in 3 days  Repeat PT/INR on October 14 and Coumadin management by primary care physician at the facility  PCP to check digoxin levels in a week  Follow up with Dr. Humphrey Rolls, cardiology at 11 AM on 10/18    DIET:  Cardiac diet, diabetic  DISCHARGE CONDITION:  Fair  ACTIVITY:  Activity as tolerated as per PT recommendations  OXYGEN:  Home Oxygen: No.   Oxygen Delivery: room air  DISCHARGE LOCATION:  nursing home   If you experience worsening of your admission symptoms, develop shortness of breath, life threatening emergency, suicidal or  homicidal thoughts you must seek medical attention immediately by calling 911 or calling your MD immediately  if symptoms less severe.  You Must read complete instructions/literature along with all the possible adverse reactions/side effects for all the Medicines you take and that have been prescribed to you. Take any new Medicines after you have completely understood and accpet all the possible adverse reactions/side effects.   Please note  You were cared for by a hospitalist during your hospital stay. If you have any questions about your discharge medications or the care you received while you were in the hospital after you are discharged, you can call the unit and asked to speak with the hospitalist on call if the hospitalist that took care of you is not available. Once you are discharged, your  primary care physician will handle any further medical issues. Please note that NO REFILLS for any discharge medications will be authorized once you are discharged, as it is imperative that you return to your primary care physician (or establish a relationship with a primary care physician if you do not have one) for your aftercare needs so that they can reassess your need for medications and monitor your lab values.     Today  Chief Complaint  Patient presents with  . Shortness of Breath   Patient is feeling much better. Denies any palpitations or shortness of breath. Still feeling weak and changed her mind  and decided to go to skilled nursing facility  ROS:  CONSTITUTIONAL: Denies fevers, chills. Denies any fatigue, reporting generalized weakness.  EYES: Denies blurry vision, double vision, eye pain. EARS, NOSE, THROAT: Denies tinnitus, ear pain, hearing loss. RESPIRATORY: Denies cough, wheeze, shortness of breath.  CARDIOVASCULAR: Denies chest pain, palpitations, edema.  GASTROINTESTINAL: Denies nausea, vomiting, diarrhea, abdominal pain. Denies bright red blood per rectum. GENITOURINARY:  Denies dysuria, hematuria. ENDOCRINE: Denies nocturia or thyroid problems. HEMATOLOGIC AND LYMPHATIC: Denies easy bruising or bleeding. SKIN: Denies rash or lesion. MUSCULOSKELETAL: Denies pain in neck, back, shoulder, knees, hips or arthritic symptoms.  NEUROLOGIC: Denies paralysis, paresthesias.  PSYCHIATRIC: Denies anxiety or depressive symptoms.   VITAL SIGNS:  Blood pressure 147/78, pulse 85, temperature 97.2 F (36.2 C), temperature source Oral, resp. rate 18, height 5\' 9"  (1.753 m), weight 84.913 kg (187 lb 3.2 oz), SpO2 97 %.  I/O:   Intake/Output Summary (Last 24 hours) at 05/23/15 1334 Last data filed at 05/23/15 1201  Gross per 24 hour  Intake    246 ml  Output    470 ml  Net   -224 ml    PHYSICAL EXAMINATION:  GENERAL:  81 y.o.-year-old patient lying in the bed with no acute distress.  EYES: Pupils equal, round, reactive to light and accommodation. No scleral icterus. Extraocular muscles intact.  HEENT: Head atraumatic, normocephalic. Oropharynx and nasopharynx clear.  NECK:  Supple, no jugular venous distention. No thyroid enlargement, no tenderness.  LUNGS: Normal breath sounds bilaterally, no wheezing, rales,rhonchi or crepitation. No use of accessory muscles of respiration.  CARDIOVASCULAR: Irregularly irregular. No murmurs, rubs, or gallops.  ABDOMEN: Soft, non-tender, non-distended. Bowel sounds present. No organomegaly or mass.  EXTREMITIES: No pedal edema, cyanosis, or clubbing.  NEUROLOGIC: Cranial nerves II through XII are intact. Muscle strength 5/5 in all extremities. Sensation intact. Gait not checked.  PSYCHIATRIC: The patient is alert and oriented x 3.  SKIN: No obvious rash, lesion, or ulcer.   DATA REVIEW:   CBC  Recent Labs Lab 05/23/15 0456  WBC 10.2  HGB 14.7  HCT 42.7  PLT 150    Chemistries   Recent Labs Lab 05/21/15 0916 05/22/15 0425  NA 141 143  K 4.3 3.6  CL 99* 101  CO2 31 31  GLUCOSE 176* 156*  BUN 29* 28*   CREATININE 0.96 0.95  CALCIUM 9.7 9.3  AST 22  --   ALT 22  --   ALKPHOS 56  --   BILITOT 1.6*  --     Cardiac Enzymes  Recent Labs Lab 05/21/15 2029  TROPONINI <0.03    Microbiology Results  Results for orders placed or performed during the hospital encounter of 05/21/15  Blood culture (routine x 2)     Status: None (Preliminary result)   Collection Time: 05/21/15  9:16 AM  Result Value Ref Range Status  Specimen Description BLOOD RIGHT ASSIST CONTROL  Final   Special Requests   Final    BOTTLES DRAWN AEROBIC AND ANAEROBIC  2CC AERO 1 CC ANAERO   Culture NO GROWTH 2 DAYS  Final   Report Status PENDING  Incomplete  Blood culture (routine x 2)     Status: None (Preliminary result)   Collection Time: 05/21/15  9:16 AM  Result Value Ref Range Status   Specimen Description BLOOD LEFT ASSIST CONTROL  Final   Special Requests BOTTLES DRAWN AEROBIC AND ANAEROBIC  1 CC  Final   Culture NO GROWTH 2 DAYS  Final   Report Status PENDING  Incomplete    RADIOLOGY:  Dg Chest Portable 1 View  05/21/2015  CLINICAL DATA:  Shortness of breath for 3 days. History of atrial fibrillation. EXAM: PORTABLE CHEST 1 VIEW COMPARISON:  April 18, 2014 FINDINGS: There is stable scarring in the left base. There is no edema or consolidation. Heart size and pulmonary vascularity are within normal limits. No adenopathy. No bone lesions. There is atherosclerotic calcification in the aorta. There is arthropathy in the right shoulder. IMPRESSION: Scarring left base.  No edema or consolidation. Electronically Signed   By: Lowella Grip III M.D.   On: 05/21/2015 09:40    EKG:   Orders placed or performed during the hospital encounter of 05/21/15  . ED EKG  . ED EKG      Management plans discussed with the patient, family and they are in agreement.  CODE STATUS:     Code Status Orders        Start     Ordered   05/21/15 1210  Do not attempt resuscitation (DNR)   Continuous    Question  Answer Comment  In the event of cardiac or respiratory ARREST Do not call a "code blue"   In the event of cardiac or respiratory ARREST Do not perform Intubation, CPR, defibrillation or ACLS   In the event of cardiac or respiratory ARREST Use medication by any route, position, wound care, and other measures to relive pain and suffering. May use oxygen, suction and manual treatment of airway obstruction as needed for comfort.   Comments nurse may pronounce      05/21/15 1211      TOTAL TIME TAKING CARE OF THIS PATIENT: 45 minutes.    @[facility] @  on 05/23/2015 at 1:34 PM  Between 7am to 6pm - Pager - [phone]  After 6pm go to [email or web address] - password [email or web address] [facility]  Office  [phone]  CC: Primary care physician; [name], MD

## 2015-05-23 NOTE — Progress Notes (Signed)
Checked to see if pre-authorization would be needed for non-emergent EMS transport.  Per [employer]’s automated system, [phone], patient has a [employer] Medicare Advantage PPO policy.  [employer] Medicare PPO plans do not require pre-auth for non-emergent ground transports using service codes A0426 or A0428.   °

## 2015-05-23 NOTE — Care Management (Signed)
Patient has changed her mind about going home with home health.  Chooses skilled nursing placement.  CSW informed.  Notified [facility]

## 2015-05-23 NOTE — Discharge Instructions (Signed)
Activity as recommended by physical therapy Diet healthy heart, diabetic Follow-up with primary care physician at the facility in 3 days Repeat PT/INR on October 14 and Coumadin management by primary care physician at the facility PCP to check digoxin levels in a week Follow up with Dr. Humphrey Rolls, cardiology at 11 AM on 10/18

## 2015-05-23 NOTE — Progress Notes (Signed)
SNF and Non-Emergent EMS Transport Benefits:  Number called: [phone] Rep: Aaron Edelman Reference Number: [id number]  [employer] Medicare Advantage PPO plan active since 03/10/14 with no deductible.   Out of pocket max is $3300 for both in and out of network combined, of which $1165.35 met so far.  SNF Benefits: $0 copay for days 1-20 and %50 copay per day for days 21-100.  Once out of pocket reached, patient covered at 100% for remainder of 100 day benefit period.  $0 copay for professional fees and 3 day hospital stay is not required.  Josem Kaufmann is required: [phone].    Non-emergent EMS transport: $75 copay for each one way medically necessary, Medicare covered trip.  Josem Kaufmann is not required for PPO plans for service codes [phone] or [phone].

## 2015-05-23 NOTE — Progress Notes (Signed)
Report called to [facility] nurse. Patients status and discharge instructions gone over with nurse. Follow up  appt and checkup explained to staff and made aware that information is on patients discharge instructions. Iv removed and telemetry removed. Patient to be discharged on ra. No distress noted. Family at bedside. Packet given to family and instructed to given to staff and [facility]

## 2015-05-23 NOTE — Clinical Social Work Placement (Signed)
   CLINICAL SOCIAL WORK PLACEMENT  NOTE  Date:  05/23/2015  Patient Details  Name: NEOMI LAIDLER MRN: [id number] Date of Birth: Jul 16, 1929  Clinical Social Work is seeking post-discharge placement for this patient at the [place] level of care (*CSW will initial, date and re-position this form in  chart as items are completed):  Yes   Patient/family provided with [place] Work Department's list of facilities offering this level of care within the geographic area requested by the patient (or if unable, by the patient's family).  Yes   Patient/family informed of their freedom to choose among providers that offer the needed level of care, that participate in Medicare, Medicaid or managed care program needed by the patient, have an available bed and are willing to accept the patient.  Yes   Patient/family informed of [facility]'s ownership interest in [facility] and [facility], as well as of the fact that they are under no obligation to receive care at these facilities.  PASRR submitted to EDS on 05/22/15     PASRR number received on 05/22/15     Existing PASRR number confirmed on       FL2 transmitted to all facilities in geographic area requested by pt/family on 05/22/15     FL2 transmitted to all facilities within larger geographic area on       Patient informed that his/her managed care company has contracts with or will negotiate with certain facilities, including the following:        Yes   Patient/family informed of bed offers received.  Patient chooses bed at  [facility])     Physician recommends and patient chooses bed at      Patient to be transferred to  [facility]) on 05/23/15.  Patient to be transferred to facility by  (family, son and Daughter)     Patient family notified on 05/23/15 of transfer.  Name of family member notified:   (Son, [name] and Joycelyn Man)     PHYSICIAN Please sign FL2      Additional Comment:    _______________________________________________ Maurine Cane, LCSW 05/23/2015, 3:24 PM

## 2015-05-23 NOTE — Progress Notes (Signed)
   SUBJECTIVE: Pt states she is feeling better but remains weak. C/o urinary frequency and left lower abdominal pain, also concerned with elevated BP   Filed Vitals:   05/22/15 2028 05/23/15 0518 05/23/15 0800 05/23/15 0821  BP: 139/75 137/74  166/72  Pulse: 79 82 84   Temp: 98 F (36.7 C) 98.3 F (36.8 C) 98.1 F (36.7 C)   TempSrc: Oral Oral Oral   Resp: 18 16 18    Height:      Weight:      SpO2: 94% 95%      Intake/Output Summary (Last 24 hours) at 05/23/15 0945 Last data filed at 05/22/15 2158  Gross per 24 hour  Intake    483 ml  Output    320 ml  Net    163 ml    LABS: Basic Metabolic Panel:  Recent Labs  05/21/15 0916 05/22/15 0425  NA 141 143  K 4.3 3.6  CL 99* 101  CO2 31 31  GLUCOSE 176* 156*  BUN 29* 28*  CREATININE 0.96 0.95  CALCIUM 9.7 9.3   Liver Function Tests:  Recent Labs  05/21/15 0916  AST 22  ALT 22  ALKPHOS 56  BILITOT 1.6*  PROT 7.2  ALBUMIN 4.2   No results for input(s): LIPASE, AMYLASE in the last 72 hours. CBC:  Recent Labs  05/22/15 0425 05/23/15 0456  WBC 8.2 10.2  HGB 15.0 14.7  HCT 43.8 42.7  MCV 98.9 99.1  PLT 130* 150   Cardiac Enzymes:  Recent Labs  05/21/15 0916 05/21/15 1634 05/21/15 2029  TROPONINI <0.03 <0.03 <0.03   BNP: Invalid input(s): POCBNP D-Dimer: No results for input(s): DDIMER in the last 72 hours. Hemoglobin A1C:  Recent Labs  05/21/15 0916  HGBA1C 6.6*   Fasting Lipid Panel: No results for input(s): CHOL, HDL, LDLCALC, TRIG, CHOLHDL, LDLDIRECT in the last 72 hours. Thyroid Function Tests:  Recent Labs  05/21/15 0916  TSH 2.701   Anemia Panel: No results for input(s): VITAMINB12, FOLATE, FERRITIN, TIBC, IRON, RETICCTPCT in the last 72 hours.   PHYSICAL EXAM General: Well developed, well nourished, in mild acute distress HEENT:  Normocephalic and atramatic Neck:  No JVD.  Lungs: Clear bilaterally to auscultation and percussion. Heart: IRIR Abdomen: Bowel sounds are  positive, abdomen soft and non-tender  Msk:  Back normal, normal gait. Normal strength and tone for age. Extremities: No clubbing, cyanosis or edema.   Neuro: Alert and oriented X 3. Psych:  Good affect, responds appropriately  TELEMETRY: Reviewed telemetry pt in a-fib VR in 90s  ASSESSMENT AND PLAN: A-fib with RVR: Likely 2/2 steroid injection. Echo shows EF 45%, unchanged from last echo in office earlier this year. Pt has failed amiodarone therapy, Continue Bb, CCB, and digoxin 0.25mg  IV. Will continue to follow. Advise increasing Bb or CCB if BP remains elevated later today after AM meds.    Patient and plan discussed with supervising provider, Dr. Neoma Laming, who agrees with above findings.   Dawn Burnett [place], [place]  05/23/2015 9:45 AM

## 2015-05-23 NOTE — Progress Notes (Signed)
Clinical Social Worker informed by Dr. Margaretmary Eddy that patient is medically ready to discharge to SNF.  Patient and son are in agreement with discharge.  Son at bedside, informed CSW she and his sister will transport patient to facility.  Call to [facility], they are able to take patient today.  Patient will go to room 401, RN will call report and family will transport. CSW updated FL2, faxed discharge information to [facility] and prepared discharge packet for family to take to facility.   No additional CSW services needed at this time.  Casimer Lanius. LCSWA, MSW Clinical Social Work Department 3:26 PM

## 2015-05-23 NOTE — Progress Notes (Signed)
Spoke with dr. Margaretmary Eddy to clarify benicar and htcz on discharge med list. Per md she does want patient to continue both for a total of 25mg  po hctz. Called [facility] to make aware of clarification [employer]

## 2015-05-23 NOTE — Care Management (Signed)
Dawn Burnett was not able to staff nursing.  Patient's second choice is [facility] as she has also received services from that Agency.  Referral caled

## 2015-05-27 ENCOUNTER — Ambulatory Visit: Payer: Medicare Other | Admitting: Family Medicine

## 2015-05-27 LAB — CULTURE, BLOOD (ROUTINE X 2)
Culture: NO GROWTH
Culture: NO GROWTH

## 2015-06-10 ENCOUNTER — Telehealth: Payer: Self-pay

## 2015-06-10 NOTE — Telephone Encounter (Signed)
Estill Bamberg at [facility] wants to know if they can have pt orders to eval and treat. Also they need nursing for 2 times a week this week, 3 times a week for the next two weeks, and 2 times a week for 2 weeks after that....    Please call her back at [phone]  Thanks teri

## 2015-06-11 NOTE — Telephone Encounter (Signed)
[id number] 

## 2015-06-11 NOTE — Telephone Encounter (Signed)
[email or web address] review-aa 

## 2015-06-11 NOTE — Telephone Encounter (Signed)
ok 

## 2015-06-11 NOTE — Telephone Encounter (Signed)
Amanda informed. She also states that they told pt about her PT/INR being done at home by them and she wanted to discuss that with Dr. Rosanna Randy. If pt does not bring it up, we need to ask her about it at next OV.

## 2015-06-12 ENCOUNTER — Ambulatory Visit: Payer: Medicare Other

## 2015-06-12 ENCOUNTER — Ambulatory Visit (INDEPENDENT_AMBULATORY_CARE_PROVIDER_SITE_OTHER): Payer: Medicare Other | Admitting: Family Medicine

## 2015-06-12 VITALS — BP 158/74 | HR 66 | Temp 98.1°F | Resp 14 | Wt 193.0 lb

## 2015-06-12 DIAGNOSIS — Z09 Encounter for follow-up examination after completed treatment for conditions other than malignant neoplasm: Secondary | ICD-10-CM

## 2015-06-12 DIAGNOSIS — E119 Type 2 diabetes mellitus without complications: Secondary | ICD-10-CM

## 2015-06-12 DIAGNOSIS — I4891 Unspecified atrial fibrillation: Secondary | ICD-10-CM

## 2015-06-12 DIAGNOSIS — G629 Polyneuropathy, unspecified: Secondary | ICD-10-CM | POA: Diagnosis not present

## 2015-06-12 DIAGNOSIS — H6121 Impacted cerumen, right ear: Secondary | ICD-10-CM | POA: Diagnosis not present

## 2015-06-12 DIAGNOSIS — I10 Essential (primary) hypertension: Secondary | ICD-10-CM | POA: Diagnosis not present

## 2015-06-12 LAB — POCT INR
INR: 2.5
PT: 29.9

## 2015-06-12 NOTE — Progress Notes (Signed)
Patient ID: Dawn Burnett, female   DOB: Feb 25, 1929, 81 y.o.   MRN: [id number]    Subjective:  HPI  Hospital follow up: Patient went in to [facility] on October 11th for difficulty breathing. She had EKG, Chest xray, labs done-all was stable except for her sugar been elevated.  She was started prescribed Digoxin, Metformin and HCTZ. Patient did not take Digoxin or HCTZ. She tried to take Metformin but she was not able to tolerate due to stomach issues.  After been discharged from [facility] she went to [facility] for 2 weeks-there they checked her sugar three times daily and it was running around 141 to 147. A1C on October 11th was 6.6  Prior to Admission medications   Medication Sig Start Date End Date Taking? Authorizing Provider  atorvastatin (LIPITOR) 20 MG tablet Take 20 mg by mouth at bedtime.    Yes Historical Provider, MD  Calcium-Vitamin D 600-200 MG-UNIT per tablet Take 600 tablets by mouth daily with supper.    Yes Historical Provider, MD  celecoxib (CELEBREX) 200 MG capsule Take 1 capsule (200 mg total) by mouth daily as needed. Patient taking differently: Take 200 mg by mouth daily.  04/12/15  Yes Richard Maceo Pro., MD  desoximetasone (TOPICORT) 0.05 % cream Apply topically 2 (two) times daily. 12/04/13  Yes Max T Hyatt, DPM  diltiazem (DILACOR XR) 120 MG 24 hr capsule Take 120 mg by mouth daily.   Yes Historical Provider, MD  furosemide (LASIX) 20 MG tablet Take 20 mg by mouth daily.    Yes Historical Provider, MD  gabapentin (NEURONTIN) 600 MG tablet TAKE 1 TABLET BY MOUTH 3 TIMES A DAY 05/14/15  Yes Jerrol Banana., MD  imipramine (TOFRANIL) 25 MG tablet Take 25 mg by mouth at bedtime.    Yes Historical Provider, MD  levothyroxine (SYNTHROID, LEVOTHROID) 50 MCG tablet Take 50 mcg by mouth daily before breakfast.   Yes Historical Provider, MD  metoprolol (LOPRESSOR) 100 MG tablet Take 100 mg by mouth 2 (two) times daily.    Yes Historical Provider, MD  mometasone (NASONEX) 50  MCG/ACT nasal spray Place 2 sprays into the nose daily. 01/30/15  Yes Richard Maceo Pro., MD  Multiple Vitamins-Minerals (CENTRUM SILVER ULTRA WOMENS PO) Take 1 tablet by mouth daily with lunch.    Yes Historical Provider, MD  olmesartan-hydrochlorothiazide (BENICAR HCT) 20-12.5 MG per tablet Take 1 tablet by mouth daily.    Yes Historical Provider, MD  omeprazole (PRILOSEC) 20 MG capsule Take 20 mg by mouth 2 (two) times daily before a meal.    Yes Historical Provider, MD  ranitidine (ZANTAC) 150 MG tablet Take 150 mg by mouth 2 (two) times daily before a meal.    Yes Historical Provider, MD  warfarin (COUMADIN) 4 MG tablet Take 4 mg by mouth See admin instructions. Every evening on Monday, Wednesday, and Friday   Yes Historical Provider, MD  warfarin (COUMADIN) 5 MG tablet Take 5 mg by mouth See admin instructions. Every evening on Sunday, Tuesday, Thursday, and Saturday   Yes Historical Provider, MD    Patient Active Problem List   Diagnosis Date Noted  . Atrial fibrillation with RVR ([place]) 05/21/2015  . 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 Allergic rhinitis 12/07/2014  . Arthritis 12/07/2014  . Atrial fibrillation, controlled ([place]) 12/07/2014  . Arteriosclerosis of coronary artery 12/07/2014  . Essential (primary) hypertension 12/07/2014  . Acid reflux 12/07/2014  . History of prolonged Q-T interval on  ECG 12/07/2014  . Blood glucose elevated 12/07/2014  . Adult hypothyroidism 12/07/2014  . Urinary incontinence 12/07/2014  . Neuropathy ([place]) 12/07/2014  . Adiposity 12/07/2014  . Osteopenia 12/07/2014  . HLD (hyperlipidemia) 12/07/2014  . Alteration in bowel elimination: incontinence 12/07/2014  . Hiatal hernia   . Atrial fibrillation ([place])   . Basal cell carcinoma of ear 11/14/2014  . Bladder infection, chronic 06/01/2012  . Urethral caruncle 06/01/2012  . SCC (squamous cell carcinoma), face 06/16/2011  . ABDOMINAL MASS 05/16/2009  . NONSPECIFIC ABN  FINDING RAD & OTH EXAM GI TRACT 04/24/2009    Past Medical History  Diagnosis Date  . Hypertension   . Arthritis   . GERD (gastroesophageal reflux disease)   . Hiatal hernia   . Thyroid disease   . CHF (congestive heart failure) ([place])   . Atrial fibrillation [facility])     Social History   Social History  . Marital Status: Widowed    Spouse Name: widowed  . Number of Children: 2  . Years of Education: 12   Occupational History  . retired    Social History Main Topics  . Smoking status: Never Smoker   . Smokeless tobacco: Never Used  . Alcohol Use: No  . Drug Use: No  . Sexual Activity: No   Other Topics Concern  . Not on file   Social History Narrative    Allergies  Allergen Reactions  . Iodinated Diagnostic Agents Rash  . Augmentin [Amoxicillin-Pot Clavulanate] Other (See Comments)    Gi distress   . Codeine   . Crestor [Rosuvastatin] Other (See Comments)    Unknown  . Naproxen Other (See Comments)    unknown  . Prednisone   . Sulfa Antibiotics   . Sulfacetamide Sodium   . Tolectin [Tolmetin]     Review of Systems  Constitutional: Positive for malaise/fatigue.  Respiratory: Negative.   Cardiovascular: Positive for leg swelling.  Gastrointestinal: Negative.   Musculoskeletal: Positive for joint pain.  Neurological: Negative.   Endo/Heme/Allergies: Negative.   Psychiatric/Behavioral: Negative.     Immunization History  Administered Date(s) Administered  . Influenza,inj,Quad PF,36+ Mos 05/22/2015  . Td 06/20/2004   Objective:  BP 158/74 mmHg  Pulse 66  Temp(Src) 98.1 F (36.7 C)  Resp 14  Wt 193 lb (87.544 kg)  SpO2 95%  Physical Exam  Constitutional: She is oriented to person, place, and time and well-developed, well-nourished, and in no distress.  HENT:  Head: Normocephalic and atraumatic.  Right Ear: External ear normal. Decreased hearing is noted.  Left Ear: External ear normal.  Nose: Nose normal.  Eyes: Conjunctivae are normal.  Pupils are equal, round, and reactive to light.  Neck: Normal range of motion. Neck supple.  Cardiovascular: Normal rate, regular rhythm, normal heart sounds and intact distal pulses.   No murmur heard. Pulmonary/Chest: Effort normal and breath sounds normal. No respiratory distress. She has no wheezes. She has no rales.  Abdominal: Soft.  Neurological: She is alert and oriented to person, place, and time.  Skin: Skin is warm and dry.  Psychiatric: Mood, memory, affect and judgment normal.    Lab Results  Component Value Date   WBC 10.2 05/23/2015   HGB 14.7 05/23/2015   HCT 42.7 05/23/2015   PLT 150 05/23/2015   GLUCOSE 156* 05/22/2015   CHOL 181 12/11/2013   TRIG 376* 12/11/2013   HDL 43 12/11/2013   LDLCALC 63 12/11/2013   TSH 2.701 05/21/2015   INR 2.02 05/23/2015   HGBA1C  6.6* 05/21/2015    CMP     Component Value Date/Time   NA 143 05/22/2015 0425   NA 144 07/10/2014 1022   NA 146 06/14/2013   K 3.6 05/22/2015 0425   K 4.3 07/10/2014 1022   CL 101 05/22/2015 0425   CL 102 07/10/2014 1022   CO2 31 05/22/2015 0425   CO2 33* 07/10/2014 1022   GLUCOSE 156* 05/22/2015 0425   GLUCOSE 134* 07/10/2014 1022   BUN 28* 05/22/2015 0425   BUN 18 07/10/2014 1022   BUN 23* 06/14/2013   CREATININE 0.95 05/22/2015 0425   CREATININE 1.07 07/10/2014 1022   CREATININE 1.0 06/14/2013   CALCIUM 9.3 05/22/2015 0425   CALCIUM 9.5 07/10/2014 1022   PROT 7.2 05/21/2015 0916   PROT 6.8 07/10/2014 1022   ALBUMIN 4.2 05/21/2015 0916   ALBUMIN 3.7 07/10/2014 1022   AST 22 05/21/2015 0916   AST 22 07/10/2014 1022   ALT 22 05/21/2015 0916   ALT 24 07/10/2014 1022   ALKPHOS 56 05/21/2015 0916   ALKPHOS 60 07/10/2014 1022   BILITOT 1.6* 05/21/2015 0916   BILITOT 1.0 07/10/2014 1022   GFRNONAA 53* 05/22/2015 0425   GFRNONAA 52* 07/10/2014 1022   GFRNONAA 44* 12/25/2013 1036   GFRAA >60 05/22/2015 0425   GFRAA >60 07/10/2014 1022   GFRAA 52* 12/25/2013 1036    Assessment and  Plan :  1. Hospital discharge follow-up Records reviewed.Pt slowly improving.  2. Atrial fibrillation with RVR ([facility]) INR 2.5 today. Continue current dose. - POCT INR--2.5 today.  3. Essential (primary) hypertension  4. Neuropathy ([place])  5. Diabetes mellitus, stable ([place]) Did not tolerate Metformin. Will follow for now, work on habits and will re check on the next visit. BS exacerbated by stress of illness. 6. Excess wax in ear, right Ear irrigation done today.Cleared completely.  I have done the exam and reviewed the above chart and it is accurate to the best of my knowledge.  Patient was seen and examined by Dr. Eulas Post and note was scribed by Theressa Millard, RMA.    Miguel Aschoff MD [facility] 06/12/2015 3:38 PM

## 2015-06-14 ENCOUNTER — Telehealth: Payer: Self-pay | Admitting: Family Medicine

## 2015-06-14 NOTE — Telephone Encounter (Signed)
Dawn Burnett with [facility] is requesting a verbal order for physical therapy 2 times a week for 6 weeks.  ZV#[phone]

## 2015-06-14 NOTE — Telephone Encounter (Signed)
[email or web address] review-aa 

## 2015-06-17 ENCOUNTER — Other Ambulatory Visit: Payer: Self-pay | Admitting: Family Medicine

## 2015-06-17 NOTE — Telephone Encounter (Signed)
ok 

## 2015-06-17 NOTE — Telephone Encounter (Signed)
Dorian with [facility] is returning Ana's call.  IZ#[phone]/JP

## 2015-06-18 NOTE — Telephone Encounter (Signed)
Dawn Burnett

## 2015-06-24 ENCOUNTER — Ambulatory Visit: Payer: Medicare Other | Admitting: Oncology

## 2015-06-24 ENCOUNTER — Other Ambulatory Visit: Payer: Medicare Other

## 2015-07-09 ENCOUNTER — Inpatient Hospital Stay: Payer: Medicare Other | Attending: Oncology

## 2015-07-09 ENCOUNTER — Inpatient Hospital Stay (HOSPITAL_BASED_OUTPATIENT_CLINIC_OR_DEPARTMENT_OTHER): Payer: Medicare Other | Admitting: Oncology

## 2015-07-09 ENCOUNTER — Encounter: Payer: Self-pay | Admitting: Oncology

## 2015-07-09 VITALS — BP 135/81 | HR 78 | Temp 96.7°F | Wt 192.3 lb

## 2015-07-09 DIAGNOSIS — I509 Heart failure, unspecified: Secondary | ICD-10-CM | POA: Insufficient documentation

## 2015-07-09 DIAGNOSIS — E079 Disorder of thyroid, unspecified: Secondary | ICD-10-CM | POA: Diagnosis not present

## 2015-07-09 DIAGNOSIS — I4891 Unspecified atrial fibrillation: Secondary | ICD-10-CM | POA: Insufficient documentation

## 2015-07-09 DIAGNOSIS — C49A0 Gastrointestinal stromal tumor, unspecified site: Secondary | ICD-10-CM

## 2015-07-09 DIAGNOSIS — K219 Gastro-esophageal reflux disease without esophagitis: Secondary | ICD-10-CM | POA: Diagnosis not present

## 2015-07-09 DIAGNOSIS — I10 Essential (primary) hypertension: Secondary | ICD-10-CM | POA: Diagnosis not present

## 2015-07-09 DIAGNOSIS — C49A2 Gastrointestinal stromal tumor of stomach: Secondary | ICD-10-CM | POA: Insufficient documentation

## 2015-07-09 DIAGNOSIS — K449 Diaphragmatic hernia without obstruction or gangrene: Secondary | ICD-10-CM | POA: Diagnosis not present

## 2015-07-09 DIAGNOSIS — Z79899 Other long term (current) drug therapy: Secondary | ICD-10-CM | POA: Diagnosis not present

## 2015-07-09 LAB — CBC WITH DIFFERENTIAL/PLATELET
Basophils Absolute: 0 10*3/uL (ref 0–0.1)
Basophils Relative: 0 %
Eosinophils Absolute: 0.1 10*3/uL (ref 0–0.7)
Eosinophils Relative: 1 %
HCT: 39.4 % (ref 35.0–47.0)
Hemoglobin: 13.2 g/dL (ref 12.0–16.0)
Lymphocytes Relative: 29 %
Lymphs Abs: 2.3 10*3/uL (ref 1.0–3.6)
MCH: 32.9 pg (ref 26.0–34.0)
MCHC: 33.4 g/dL (ref 32.0–36.0)
MCV: 98.4 fL (ref 80.0–100.0)
Monocytes Absolute: 0.6 10*3/uL (ref 0.2–0.9)
Monocytes Relative: 8 %
Neutro Abs: 4.9 10*3/uL (ref 1.4–6.5)
Neutrophils Relative %: 62 %
Platelets: 158 10*3/uL (ref 150–440)
RBC: 4.01 MIL/uL (ref 3.80–5.20)
RDW: 14.4 % (ref 11.5–14.5)
WBC: 8 10*3/uL (ref 3.6–11.0)

## 2015-07-09 LAB — COMPREHENSIVE METABOLIC PANEL
ALT: 14 U/L (ref 14–54)
AST: 21 U/L (ref 15–41)
Albumin: 3.9 g/dL (ref 3.5–5.0)
Alkaline Phosphatase: 48 U/L (ref 38–126)
Anion gap: 10 (ref 5–15)
BUN: 22 mg/dL — ABNORMAL HIGH (ref 6–20)
CO2: 30 mmol/L (ref 22–32)
Calcium: 8.9 mg/dL (ref 8.9–10.3)
Chloride: 100 mmol/L — ABNORMAL LOW (ref 101–111)
Creatinine, Ser: 1.07 mg/dL — ABNORMAL HIGH (ref 0.44–1.00)
GFR calc Af Amer: 54 mL/min — ABNORMAL LOW (ref >60)
GFR calc non Af Amer: 46 mL/min — ABNORMAL LOW (ref >60)
Glucose, Bld: 158 mg/dL — ABNORMAL HIGH (ref 65–99)
Potassium: 3.5 mmol/L (ref 3.5–5.1)
Sodium: 140 mmol/L (ref 135–145)
Total Bilirubin: 1.2 mg/dL (ref 0.3–1.2)
Total Protein: 6.4 g/dL — ABNORMAL LOW (ref 6.5–8.1)

## 2015-07-09 NOTE — Progress Notes (Signed)
Dawn Burnett @ [facility] [phone]  Fax:([phone]) [place]: 12-Dec-1928  MR#: [id number]  VCB#:[id number]  Patient Care Team: Jerrol Banana., MD as PCP - General (Family Medicine)  CHIEF COMPLAINT:  Chief Complaint  Patient presents with  . GIST    Oncology History   Chief Complaint/Diagnosis:   1. Gastrointestinal stromal tumor, diagnoses by endoscopy.   2. Ultrasound biopsy on May 29, 2009. 3. Resection of the tumor in November, 2010.     SCC (squamous cell carcinoma), face   06/16/2011 Initial Diagnosis SCC (squamous cell carcinoma), face    No flowsheet data found.  INTERVAL HISTORY: 81 year old lady with a history of gastrointestinal stromal tumor came today further follow-up.  Had a recent CT scan of abdomen.  No abdominal pain.  Recently patient was hospitalized with a history of congestive heart failure. Patient's general condition has been declining due to multiple medical illnesses.  No abdominal pain no nausea no vomiting no diarrhea   REVIEW OF SYSTEMS:   ENERAL:  Feels good.  Active.  No fevers, sweats or weight loss. PERFORMANCE STATUS (ECOG):  01 HEENT:  No visual changes, runny nose, sore throat, mouth sores or tenderness. Lungs: No shortness of breath or cough.  No hemoptysis. Cardiac:  Recent hospitalization for congestive heart failure GI:  No nausea, vomiting, diarrhea, constipation, melena or hematochezia. GU:  No urgency, frequency, dysuria, or hematuria. Musculoskeletal:  No back pain.  No joint pain.  No muscle tenderness. Extremities:  No pain or swelling. Skin:  No rashes or skin changes. Neuro:  No headache, numbness or weakness, balance or coordination issues. Endocrine:  No diabetes, thyroid issues, hot flashes or night sweats. Psych:  No mood changes, depression or anxiety. Pain:  No focal pain. Review of systems:  All other systems reviewed and found to be negative.  As per HPI. Otherwise, a complete  review of systems is negatve.  PAST MEDICAL HISTORY: Past Medical History  Diagnosis Date  . Hypertension   . Arthritis   . GERD (gastroesophageal reflux disease)   . Hiatal hernia   . Thyroid disease   . CHF (congestive heart failure) ([place])   . Atrial fibrillation ([place])     PAST SURGICAL HISTORY: Past Surgical History  Procedure Laterality Date  . Cateract extraction    . Breast surgery    . Abdominal hysterectomy    . Knee surgery    . Right total knee replacement    . Right hip surgery    . Appendectomy    . Removal of gastrointestinal stomatic  tumor of stomach  06/19/2009    FAMILY HISTORY Family History  Problem Relation Age of Onset  . Heart disease Mother   . Hypertension Mother   . Heart attack Mother   . Stomach cancer Father   . Liver cancer Brother   . Hypertension Son   . Huntington's disease Sister   . Diabetes Sister   . Kidney disease Sister   . Congestive Heart Failure Sister   . Pneumonia Sister   . Stroke Brother   . Parkinson's disease Brother   . Hypertension Brother   . Congestive Heart Failure Brother     GYNECOLOGIC HISTORY:  No LMP recorded. Patient has had a hysterectomy.     ADVANCED DIRECTIVES: Patient does have a living will   HEALTH MAINTENANCE: Social History  Substance Use Topics  . Smoking status: Never Smoker   . Smokeless tobacco: Never Used  .  Alcohol Use: No     Allergies  Allergen Reactions  . Iodinated Diagnostic Agents Rash  . Augmentin [Amoxicillin-Pot Clavulanate] Other (See Comments)    Gi distress   . Codeine   . Crestor [Rosuvastatin] Other (See Comments)    Unknown  . Naproxen Other (See Comments)    unknown  . Prednisone   . Sulfa Antibiotics   . Sulfacetamide Sodium   . Tolectin [Tolmetin]     Current Outpatient Prescriptions  Medication Sig Dispense Refill  . atorvastatin (LIPITOR) 20 MG tablet Take 20 mg by mouth at bedtime.     . Calcium-Vitamin D 600-200 MG-UNIT per tablet Take 600  tablets by mouth daily with supper.     Marland Kitchen CARTIA XT 120 MG 24 hr capsule     . celecoxib (CELEBREX) 200 MG capsule Take 1 capsule (200 mg total) by mouth daily as needed. (Patient taking differently: Take 200 mg by mouth daily. ) 30 capsule 5  . desoximetasone (TOPICORT) 0.05 % cream Apply topically 2 (two) times daily. 30 g 3  . diltiazem (DILACOR XR) 120 MG 24 hr capsule Take 120 mg by mouth daily.    . furosemide (LASIX) 20 MG tablet Take 20 mg by mouth daily.     Marland Kitchen gabapentin (NEURONTIN) 600 MG tablet TAKE 1 TABLET BY MOUTH 3 TIMES A DAY 90 tablet 11  . imipramine (TOFRANIL) 25 MG tablet Take 25 mg by mouth at bedtime.     Marland Kitchen levothyroxine (SYNTHROID, LEVOTHROID) 50 MCG tablet Take 50 mcg by mouth daily before breakfast.    . metoprolol (LOPRESSOR) 100 MG tablet Take 100 mg by mouth 2 (two) times daily.     . mometasone (NASONEX) 50 MCG/ACT nasal spray Place 2 sprays into the nose daily. 17 g 12  . Multiple Vitamins-Minerals (CENTRUM SILVER ULTRA WOMENS PO) Take 1 tablet by mouth daily with lunch.     . olmesartan-hydrochlorothiazide (BENICAR HCT) 20-12.5 MG per tablet Take 1 tablet by mouth daily.     Marland Kitchen omeprazole (PRILOSEC) 20 MG capsule Take 20 mg by mouth 2 (two) times daily before a meal.     . ranitidine (ZANTAC) 150 MG tablet Take 150 mg by mouth 2 (two) times daily before a meal.     . warfarin (COUMADIN) 4 MG tablet Take 4 mg by mouth See admin instructions. Every evening on Monday, Wednesday, and Friday    . potassium chloride (K-DUR) 10 MEQ tablet     . warfarin (COUMADIN) 5 MG tablet Take 5 mg by mouth See admin instructions. Every evening on Sunday, Tuesday, Thursday, and Saturday     No current facility-administered medications for this visit.    OBJECTIVE:  Filed Vitals:   07/09/15 1149  BP: 135/81  Pulse: 78  Temp: 96.7 F (35.9 C)     Body mass index is 28.39 kg/(m^2).    ECOG FS:1 - Symptomatic but completely ambulatory  PHYSICAL EXAM: GENERAL:  Well developed,  well nourished, sitting comfortably in the exam room in no acute distress. MENTAL STATUS:  Alert and oriented to person, place and time. HEAD:    Normocephalic, atraumatic, face symmetric, no Cushingoid features. EYES: .  Pupils equal round and reactive to light and accomodation.  No conjunctivitis or scleral icterus. ENT:  Oropharynx clear without lesion.  Tongue normal. Mucous membranes moist.  RESPIRATORY:  Clear to auscultation without rales, wheezes or rhonchi. CARDIOVASCULAR: Soft systolic murmur.  Irregular heart sounds. . ABDOMEN:  Soft, non-tender, with active bowel  sounds, and no hepatosplenomegaly.  Vaguely palpable soft tissue mass in epigastric area BACK:  No CVA tenderness.  No tenderness on percussion of the back or rib cage. SKIN:  No rashes, ulcers or lesions. EXTREMITIES: No edema, no skin discoloration or tenderness.  No palpable cords. LYMPH NODES: No palpable cervical, supraclavicular, axillary or inguinal adenopathy  NEUROLOGICAL: Unremarkable. PSYCH:  Appropriate.   LAB RESULTS:  Appointment on 07/09/2015  Component Date Value Ref Range Status  . WBC 07/09/2015 8.0  3.6 - 11.0 K/uL Final  . RBC 07/09/2015 4.01  3.80 - 5.20 MIL/uL Final  . Hemoglobin 07/09/2015 13.2  12.0 - 16.0 g/dL Final  . HCT 07/09/2015 39.4  35.0 - 47.0 % Final  . MCV 07/09/2015 98.4  80.0 - 100.0 fL Final  . MCH 07/09/2015 32.9  26.0 - 34.0 pg Final  . MCHC 07/09/2015 33.4  32.0 - 36.0 g/dL Final  . RDW 07/09/2015 14.4  11.5 - 14.5 % Final  . Platelets 07/09/2015 158  150 - 440 K/uL Final  . Neutrophils Relative % 07/09/2015 62   Final  . Neutro Abs 07/09/2015 4.9  1.4 - 6.5 K/uL Final  . Lymphocytes Relative 07/09/2015 29   Final  . Lymphs Abs 07/09/2015 2.3  1.0 - 3.6 K/uL Final  . Monocytes Relative 07/09/2015 8   Final  . Monocytes Absolute 07/09/2015 0.6  0.2 - 0.9 K/uL Final  . Eosinophils Relative 07/09/2015 1   Final  . Eosinophils Absolute 07/09/2015 0.1  0 - 0.7 K/uL Final    . Basophils Relative 07/09/2015 0   Final  . Basophils Absolute 07/09/2015 0.0  0 - 0.1 K/uL Final  . Sodium 07/09/2015 140  135 - 145 mmol/L Final  . Potassium 07/09/2015 3.5  3.5 - 5.1 mmol/L Final  . Chloride 07/09/2015 100* 101 - 111 mmol/L Final  . CO2 07/09/2015 30  22 - 32 mmol/L Final  . Glucose, Bld 07/09/2015 158* 65 - 99 mg/dL Final  . BUN 07/09/2015 22* 6 - 20 mg/dL Final  . Creatinine, Ser 07/09/2015 1.07* 0.44 - 1.00 mg/dL Final  . Calcium 07/09/2015 8.9  8.9 - 10.3 mg/dL Final  . Total Protein 07/09/2015 6.4* 6.5 - 8.1 g/dL Final  . Albumin 07/09/2015 3.9  3.5 - 5.0 g/dL Final  . AST 07/09/2015 21  15 - 41 U/L Final  . ALT 07/09/2015 14  14 - 54 U/L Final  . Alkaline Phosphatase 07/09/2015 48  38 - 126 U/L Final  . Total Bilirubin 07/09/2015 1.2  0.3 - 1.2 mg/dL Final  . GFR calc non Af Amer 07/09/2015 46* >60 mL/min Final  . GFR calc Af Amer 07/09/2015 54* >60 mL/min Final   Comment: (NOTE) The eGFR has been calculated using the CKD EPI equation. This calculation has not been validated in all clinical situations. eGFR's persistently <60 mL/min signify possible Chronic Kidney Disease.   . Anion gap 07/09/2015 10  5 - 15 Final       ASSESSMENT: Gastrointestinal stromal tumor status post resection.  No evidence of recurrent disease  MEDICAL DECISION MAKING:  All lab data has been reviewed. Medical records from the last hospitalization for congestive heart failure has been reviewed. On clinical examination there is no evidence of progressive gastrointestinal stromal tumor Repeat CT scan in 6 month and it remained stable no further CT scan may be needed. Patient's general condition has been declining due to multiple cardiac problems  Patient expressed understanding and was in agreement with  this plan. She also understands that She can call clinic at any time with any questions, concerns, or complaints.    No matching staging information was found for the  patient.  Forest Gleason, MD   07/09/2015 11:51 AM

## 2015-07-10 ENCOUNTER — Ambulatory Visit (INDEPENDENT_AMBULATORY_CARE_PROVIDER_SITE_OTHER): Payer: Medicare Other

## 2015-07-10 DIAGNOSIS — I4891 Unspecified atrial fibrillation: Secondary | ICD-10-CM

## 2015-07-10 DIAGNOSIS — Z23 Encounter for immunization: Secondary | ICD-10-CM

## 2015-07-10 LAB — POCT INR
INR: 2.3
PT: 27.5

## 2015-07-10 NOTE — Patient Instructions (Signed)
Continue same dose, coumadin 4 mg Daily. Call if any questions or concerns. Come back in 4 weeks. 

## 2015-07-10 NOTE — Progress Notes (Signed)
Patient ID: Dawn Burnett, female   DOB: September 16, 1928, 81 y.o.   MRN: [id number] Anticoagulation Dose Instructions as of 07/10/2015      Dorene Grebe Tue Wed Thu Fri Sat   New Dose 4 mg 4 mg 4 mg 4 mg 4 mg 4 mg 4 mg    Description        Continue same dose, coumadin 4 mg Daily. Call if any questions or concerns. Come back in 4 weeks.

## 2015-07-13 ENCOUNTER — Encounter: Payer: Self-pay | Admitting: Oncology

## 2015-07-17 ENCOUNTER — Encounter: Payer: Self-pay | Admitting: Podiatry

## 2015-07-17 ENCOUNTER — Ambulatory Visit (INDEPENDENT_AMBULATORY_CARE_PROVIDER_SITE_OTHER): Payer: Medicare Other | Admitting: Podiatry

## 2015-07-17 DIAGNOSIS — B351 Tinea unguium: Secondary | ICD-10-CM

## 2015-07-17 DIAGNOSIS — M79676 Pain in unspecified toe(s): Secondary | ICD-10-CM

## 2015-07-17 NOTE — Progress Notes (Signed)
She presents today with chief complaint of a painful elongated toenails bilaterally. States that she's recently been in the hospital for heart failure.  Objective: Vital signs are stable she is alert and oriented 3. Her toenails are thick yellow dystrophic with mycotic and painful palpation. She also has strong palpable pulses bilateral.  Assessment: Pain in limb secondary to onychomycosis 1 through 5 bilateral.  Plan: Debridement of nails 1 through 5 bilateral covered service secondary to pain.

## 2015-07-18 ENCOUNTER — Telehealth: Payer: Self-pay | Admitting: Family Medicine

## 2015-07-18 NOTE — Telephone Encounter (Signed)
[email or web address] review-aa 

## 2015-07-18 NOTE — Telephone Encounter (Signed)
Lattie Haw called with Dawn Burnett to get orders for additional nurse visits.( One Time a week for three more weeks),  Call back is [phone]  Thanks Con Memos

## 2015-07-20 NOTE — Telephone Encounter (Signed)
OK 

## 2015-07-22 NOTE — Telephone Encounter (Signed)
LMT[facility] ED 

## 2015-07-23 ENCOUNTER — Telehealth: Payer: Self-pay | Admitting: Family Medicine

## 2015-07-23 NOTE — Telephone Encounter (Signed)
Lattie Haw with [facility] is returning Elena's call.  [id number]

## 2015-07-23 NOTE — Telephone Encounter (Signed)
Lattie Haw advised on voicemail-aa

## 2015-07-23 NOTE — Telephone Encounter (Signed)
Other message-aa

## 2015-08-07 ENCOUNTER — Ambulatory Visit (INDEPENDENT_AMBULATORY_CARE_PROVIDER_SITE_OTHER): Payer: Medicare Other

## 2015-08-07 DIAGNOSIS — I4891 Unspecified atrial fibrillation: Secondary | ICD-10-CM | POA: Diagnosis not present

## 2015-08-07 LAB — POCT INR
INR: 2.7
PT: 31.9

## 2015-08-07 NOTE — Patient Instructions (Signed)
Anticoagulation Dose Instructions as of 08/07/2015      Dawn Burnett Tue Wed Thu Fri Sat   New Dose 4 mg 4 mg 4 mg 4 mg 4 mg 4 mg 4 mg    Description        Continue same dose, coumadin 4 mg Daily. Call if any questions or concerns. Come back in 4 weeks.

## 2015-08-22 ENCOUNTER — Encounter: Payer: Self-pay | Admitting: Family Medicine

## 2015-08-22 ENCOUNTER — Ambulatory Visit (INDEPENDENT_AMBULATORY_CARE_PROVIDER_SITE_OTHER): Payer: Medicare Other | Admitting: Family Medicine

## 2015-08-22 VITALS — BP 148/82 | HR 84 | Temp 98.2°F | Resp 16 | Wt 195.0 lb

## 2015-08-22 DIAGNOSIS — J0100 Acute maxillary sinusitis, unspecified: Secondary | ICD-10-CM | POA: Diagnosis not present

## 2015-08-22 DIAGNOSIS — E038 Other specified hypothyroidism: Secondary | ICD-10-CM

## 2015-08-22 DIAGNOSIS — I4891 Unspecified atrial fibrillation: Secondary | ICD-10-CM

## 2015-08-22 DIAGNOSIS — I10 Essential (primary) hypertension: Secondary | ICD-10-CM

## 2015-08-22 DIAGNOSIS — E119 Type 2 diabetes mellitus without complications: Secondary | ICD-10-CM | POA: Diagnosis not present

## 2015-08-22 LAB — POCT GLYCOSYLATED HEMOGLOBIN (HGB A1C): Hemoglobin A1C: 6.9

## 2015-08-22 MED ORDER — AMOXICILLIN 500 MG PO CAPS: 500.0000 mg | ORAL_CAPSULE | Freq: Three times a day (TID) | ORAL | Status: DC

## 2015-08-22 MED ORDER — LEVOTHYROXINE SODIUM 50 MCG PO TABS: 50.0000 ug | ORAL_TABLET | Freq: Every day | ORAL | Status: DC

## 2015-08-22 MED ORDER — LEVOTHYROXINE SODIUM 50 MCG PO TABS
50.0000 ug | ORAL_TABLET | Freq: Every day | ORAL | Status: DC
Start: 2015-08-22 — End: 2016-02-12

## 2015-08-22 NOTE — Progress Notes (Signed)
Patient ID: Dawn Burnett, female   DOB: 1929/03/27, 82 y.o.   MRN: [id number]    Subjective:  HPI  Patient is here for follow up Diabetes; Patient has her last A1C checked on 05/21/15 6.6. She was on Metformin originally but had hard time tolerating it. Patient was advised to work on habits and we would re check it on next visit.  Sinus problems: Patient has had issues with post nasal drainage, nasal congestion for at least 2 weeks. She is blowing out bloody mucus.  Prior to Admission medications   Medication Sig Start Date End Date Taking? Authorizing Provider  atorvastatin (LIPITOR) 20 MG tablet Take 20 mg by mouth at bedtime.    Yes Historical Provider, MD  Calcium-Vitamin D 600-200 MG-UNIT per tablet Take 600 tablets by mouth daily with supper.    Yes Historical Provider, MD  CARTIA XT 120 MG 24 hr capsule  07/02/15  Yes Historical Provider, MD  celecoxib (CELEBREX) 200 MG capsule Take 1 capsule (200 mg total) by mouth daily as needed. Patient taking differently: Take 200 mg by mouth daily.  04/12/15  Yes Richard Maceo Pro., MD  desoximetasone (TOPICORT) 0.05 % cream Apply topically 2 (two) times daily. 12/04/13  Yes Max T Hyatt, DPM  diltiazem (DILACOR XR) 120 MG 24 hr capsule Take 120 mg by mouth daily.   Yes Historical Provider, MD  furosemide (LASIX) 20 MG tablet Take 20 mg by mouth daily.    Yes Historical Provider, MD  gabapentin (NEURONTIN) 600 MG tablet TAKE 1 TABLET BY MOUTH 3 TIMES A DAY 05/14/15  Yes Jerrol Banana., MD  imipramine (TOFRANIL) 25 MG tablet Take 25 mg by mouth at bedtime.    Yes Historical Provider, MD  levothyroxine (SYNTHROID, LEVOTHROID) 50 MCG tablet Take 50 mcg by mouth daily before breakfast.   Yes Historical Provider, MD  metoprolol (LOPRESSOR) 100 MG tablet Take 100 mg by mouth 2 (two) times daily.    Yes Historical Provider, MD  mometasone (NASONEX) 50 MCG/ACT nasal spray Place 2 sprays into the nose daily. 01/30/15  Yes Richard Maceo Pro., MD    Multiple Vitamins-Minerals (CENTRUM SILVER ULTRA WOMENS PO) Take 1 tablet by mouth daily with lunch.    Yes Historical Provider, MD  olmesartan-hydrochlorothiazide (BENICAR HCT) 20-12.5 MG per tablet Take 1 tablet by mouth daily.    Yes Historical Provider, MD  omeprazole (PRILOSEC) 20 MG capsule Take 20 mg by mouth 2 (two) times daily before a meal.    Yes Historical Provider, MD  potassium chloride (K-DUR) 10 MEQ tablet  06/17/15  Yes Historical Provider, MD  ranitidine (ZANTAC) 150 MG tablet Take 150 mg by mouth 2 (two) times daily before a meal.    Yes Historical Provider, MD  warfarin (COUMADIN) 4 MG tablet Take 4 mg by mouth See admin instructions. Every evening on Monday, Wednesday, and Friday   Yes Historical Provider, MD  warfarin (COUMADIN) 5 MG tablet Take 5 mg by mouth See admin instructions. Every evening on Sunday, Tuesday, Thursday, and Saturday   Yes Historical Provider, MD    Patient Active Problem List   Diagnosis Date Noted  . Atrial fibrillation with RVR ([place]) 05/21/2015  . 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 Allergic rhinitis 12/07/2014  . Arthritis 12/07/2014  . Atrial fibrillation, controlled ([place]) 12/07/2014  . Arteriosclerosis of coronary artery 12/07/2014  . Essential (primary) hypertension 12/07/2014  . Acid reflux 12/07/2014  . History of prolonged Q-T interval  on ECG 12/07/2014  . Blood glucose elevated 12/07/2014  . Adult hypothyroidism 12/07/2014  . Urinary incontinence 12/07/2014  . Neuropathy ([place]) 12/07/2014  . Adiposity 12/07/2014  . Osteopenia 12/07/2014  . HLD (hyperlipidemia) 12/07/2014  . Alteration in bowel elimination: incontinence 12/07/2014  . Hiatal hernia   . Atrial fibrillation ([place])   . Basal cell carcinoma of ear 11/14/2014  . Bladder infection, chronic 06/01/2012  . Urethral caruncle 06/01/2012  . SCC (squamous cell carcinoma), face 06/16/2011  . ABDOMINAL MASS 05/16/2009  . NONSPECIFIC ABN FINDING RAD &  OTH EXAM GI TRACT 04/24/2009    Past Medical History  Diagnosis Date  . Hypertension   . Arthritis   . GERD (gastroesophageal reflux disease)   . Hiatal hernia   . Thyroid disease   . CHF (congestive heart failure) ([place])   . Atrial fibrillation [facility])     Social History   Social History  . Marital Status: Widowed    Spouse Name: widowed  . Number of Children: 2  . Years of Education: 12   Occupational History  . retired    Social History Main Topics  . Smoking status: Never Smoker   . Smokeless tobacco: Never Used  . Alcohol Use: No  . Drug Use: No  . Sexual Activity: No   Other Topics Concern  . Not on file   Social History Narrative    Allergies  Allergen Reactions  . Iodinated Diagnostic Agents Rash  . Augmentin [Amoxicillin-Pot Clavulanate] Other (See Comments)    Gi distress   . Codeine   . Crestor [Rosuvastatin] Other (See Comments)    Unknown  . Naproxen Other (See Comments)    unknown  . Prednisone   . Sulfa Antibiotics   . Sulfacetamide Sodium   . Tolectin [Tolmetin]     Review of Systems  Constitutional: Positive for malaise/fatigue.  HENT: Positive for congestion.   Eyes: Negative.   Respiratory: Negative.   Cardiovascular: Negative.   Gastrointestinal: Negative.   Genitourinary: Negative.   Musculoskeletal: Positive for joint pain.  Neurological: Positive for weakness.  Psychiatric/Behavioral: Negative.     Immunization History  Administered Date(s) Administered  . Influenza,inj,Quad PF,36+ Mos 05/22/2015  . Pneumococcal Conjugate-13 07/10/2015  . Td 06/20/2004   Objective:  BP 148/82 mmHg  Pulse 84  Temp(Src) 98.2 F (36.8 C)  Resp 16  Wt 195 lb (88.451 kg)  Physical Exam  Constitutional: She is oriented to person, place, and time and well-developed, well-nourished, and in no distress.  HENT:  Head: Normocephalic and atraumatic.  Right Ear: External ear normal.  Left Ear: External ear normal.  Nose: Right sinus  exhibits maxillary sinus tenderness. Left sinus exhibits maxillary sinus tenderness.  Eyes: Conjunctivae are normal. Pupils are equal, round, and reactive to light.  Cardiovascular: Normal rate, regular rhythm, normal heart sounds and intact distal pulses.   No murmur heard. Pulmonary/Chest: Effort normal and breath sounds normal. No respiratory distress. She has no wheezes. She has no rales.  Abdominal: Soft.  Musculoskeletal: She exhibits no edema.  Neurological: She is alert and oriented to person, place, and time.  Gait unsteady due to significant arthritis  Skin: Skin is warm and dry.  Psychiatric: Mood, memory, affect and judgment normal.    Lab Results  Component Value Date   WBC 8.0 07/09/2015   HGB 13.2 07/09/2015   HCT 39.4 07/09/2015   PLT 158 07/09/2015   GLUCOSE 158* 07/09/2015   CHOL 181 12/11/2013   TRIG 376* 12/11/2013  HDL 43 12/11/2013   LDLCALC 63 12/11/2013   TSH 2.701 05/21/2015   INR 2.7 08/07/2015   HGBA1C 6.6* 05/21/2015    CMP     Component Value Date/Time   NA 140 07/09/2015 1050   NA 144 07/10/2014 1022   NA 146 06/14/2013   K 3.5 07/09/2015 1050   K 4.3 07/10/2014 1022   CL 100* 07/09/2015 1050   CL 102 07/10/2014 1022   CO2 30 07/09/2015 1050   CO2 33* 07/10/2014 1022   GLUCOSE 158* 07/09/2015 1050   GLUCOSE 134* 07/10/2014 1022   BUN 22* 07/09/2015 1050   BUN 18 07/10/2014 1022   BUN 23* 06/14/2013   CREATININE 1.07* 07/09/2015 1050   CREATININE 1.07 07/10/2014 1022   CREATININE 1.0 06/14/2013   CALCIUM 8.9 07/09/2015 1050   CALCIUM 9.5 07/10/2014 1022   PROT 6.4* 07/09/2015 1050   PROT 6.8 07/10/2014 1022   ALBUMIN 3.9 07/09/2015 1050   ALBUMIN 3.7 07/10/2014 1022   AST 21 07/09/2015 1050   AST 22 07/10/2014 1022   ALT 14 07/09/2015 1050   ALT 24 07/10/2014 1022   ALKPHOS 48 07/09/2015 1050   ALKPHOS 60 07/10/2014 1022   BILITOT 1.2 07/09/2015 1050   BILITOT 1.0 07/10/2014 1022   GFRNONAA 46* 07/09/2015 1050   GFRNONAA  52* 07/10/2014 1022   GFRNONAA 44* 12/25/2013 1036   GFRAA 54* 07/09/2015 1050   GFRAA >60 07/10/2014 1022   GFRAA 52* 12/25/2013 1036    Assessment and Plan :  1. Diabetes mellitus, stable (HCC) A1C 6.9 stable. Continue to follow. - POCT HgB A1C  2. Atrial fibrillation, unspecified Stable. Advised patient she should not drive due to multiple medical issues.  3. Acute maxillary sinusitis, recurrence not specified New. Will treat with medication. Follow as needed. - amoxicillin (AMOXIL) 500 MG capsule; Take 1 capsule (500 mg total) by mouth 3 (three) times daily.  Dispense: 30 capsule; Refill: 0  4. Essential (primary) hypertension  5. Other specified hypothyroidism Advised patient will go ahead and start to follow this for her. Refill provided. [place] MD [facility] 08/22/2015 11:55 AM

## 2015-08-27 ENCOUNTER — Encounter: Payer: Self-pay | Admitting: Family Medicine

## 2015-09-04 ENCOUNTER — Ambulatory Visit (INDEPENDENT_AMBULATORY_CARE_PROVIDER_SITE_OTHER): Payer: Medicare Other

## 2015-09-04 ENCOUNTER — Encounter: Payer: Self-pay | Admitting: Family Medicine

## 2015-09-04 DIAGNOSIS — I4891 Unspecified atrial fibrillation: Secondary | ICD-10-CM

## 2015-09-04 LAB — POCT INR
INR: 2
PT: 23.9

## 2015-09-04 NOTE — Patient Instructions (Signed)
Anticoagulation Dose Instructions as of 09/04/2015      Dawn Burnett Tue Wed Thu Fri Sat   New Dose 4 mg 4 mg 4 mg 4 mg 4 mg 4 mg 4 mg    Description        Continue same dose, coumadin 4 mg Daily. Call if any questions or concerns. Come back in 4 weeks.

## 2015-09-19 ENCOUNTER — Other Ambulatory Visit: Payer: Self-pay

## 2015-09-26 ENCOUNTER — Emergency Department: Payer: Medicare Other

## 2015-09-26 ENCOUNTER — Encounter: Payer: Self-pay | Admitting: Emergency Medicine

## 2015-09-26 ENCOUNTER — Emergency Department
Admission: EM | Admit: 2015-09-26 | Discharge: 2015-09-26 | Disposition: A | Discharge status: Home / Self Care | Payer: Medicare Other | Attending: Emergency Medicine | Admitting: Emergency Medicine

## 2015-09-26 DIAGNOSIS — Z7901 Long term (current) use of anticoagulants: Secondary | ICD-10-CM | POA: Insufficient documentation

## 2015-09-26 DIAGNOSIS — T148XXA Other injury of unspecified body region, initial encounter: Secondary | ICD-10-CM

## 2015-09-26 DIAGNOSIS — Y998 Other external cause status: Secondary | ICD-10-CM | POA: Insufficient documentation

## 2015-09-26 DIAGNOSIS — T148 Other injury of unspecified body region: Secondary | ICD-10-CM | POA: Diagnosis not present

## 2015-09-26 DIAGNOSIS — I10 Essential (primary) hypertension: Secondary | ICD-10-CM | POA: Diagnosis not present

## 2015-09-26 DIAGNOSIS — Y9389 Activity, other specified: Secondary | ICD-10-CM | POA: Diagnosis not present

## 2015-09-26 DIAGNOSIS — W01198A Fall on same level from slipping, tripping and stumbling with subsequent striking against other object, initial encounter: Secondary | ICD-10-CM | POA: Diagnosis not present

## 2015-09-26 DIAGNOSIS — S0990XA Unspecified injury of head, initial encounter: Secondary | ICD-10-CM | POA: Diagnosis not present

## 2015-09-26 DIAGNOSIS — S199XXA Unspecified injury of neck, initial encounter: Secondary | ICD-10-CM | POA: Insufficient documentation

## 2015-09-26 DIAGNOSIS — Z79899 Other long term (current) drug therapy: Secondary | ICD-10-CM | POA: Diagnosis not present

## 2015-09-26 DIAGNOSIS — Y92002 Bathroom of unspecified non-institutional (private) residence single-family (private) house as the place of occurrence of the external cause: Secondary | ICD-10-CM | POA: Diagnosis not present

## 2015-09-26 DIAGNOSIS — W19XXXA Unspecified fall, initial encounter: Secondary | ICD-10-CM

## 2015-09-26 LAB — CBC WITH DIFFERENTIAL/PLATELET
Basophils Absolute: 0 10*3/uL (ref 0–0.1)
Basophils Relative: 0 %
Eosinophils Absolute: 0.1 10*3/uL (ref 0–0.7)
Eosinophils Relative: 1 %
HCT: 42.2 % (ref 35.0–47.0)
Hemoglobin: 14.4 g/dL (ref 12.0–16.0)
Lymphocytes Relative: 24 %
Lymphs Abs: 2 10*3/uL (ref 1.0–3.6)
MCH: 33.5 pg (ref 26.0–34.0)
MCHC: 34.2 g/dL (ref 32.0–36.0)
MCV: 98.2 fL (ref 80.0–100.0)
Monocytes Absolute: 0.7 10*3/uL (ref 0.2–0.9)
Monocytes Relative: 8 %
Neutro Abs: 5.5 10*3/uL (ref 1.4–6.5)
Neutrophils Relative %: 67 %
Platelets: 159 10*3/uL (ref 150–440)
RBC: 4.29 MIL/uL (ref 3.80–5.20)
RDW: 13.7 % (ref 11.5–14.5)
WBC: 8.3 10*3/uL (ref 3.6–11.0)

## 2015-09-26 LAB — PROTIME-INR
INR: 1.91
Prothrombin Time: 21.8 seconds — ABNORMAL HIGH (ref 11.4–15.0)

## 2015-09-26 LAB — BASIC METABOLIC PANEL
Anion gap: 10 (ref 5–15)
BUN: 22 mg/dL — ABNORMAL HIGH (ref 6–20)
CO2: 32 mmol/L (ref 22–32)
Calcium: 9.5 mg/dL (ref 8.9–10.3)
Chloride: 100 mmol/L — ABNORMAL LOW (ref 101–111)
Creatinine, Ser: 1.05 mg/dL — ABNORMAL HIGH (ref 0.44–1.00)
GFR calc Af Amer: 55 mL/min — ABNORMAL LOW (ref >60)
GFR calc non Af Amer: 47 mL/min — ABNORMAL LOW (ref >60)
Glucose, Bld: 221 mg/dL — ABNORMAL HIGH (ref 65–99)
Potassium: 3.4 mmol/L — ABNORMAL LOW (ref 3.5–5.1)
Sodium: 142 mmol/L (ref 135–145)

## 2015-09-26 LAB — APTT: aPTT: 30 seconds (ref 24–36)

## 2015-09-26 NOTE — ED Notes (Signed)
Per EMS, patient comes from home (lives alone) due to a fall. Patient was bending down to pick something up and lost her balance. Patient hit her head on the floorboard molding. Denies LOC. Denies any back or neck pain. Does c/o pain to her nose. Small laceration noted to left forehead above her eye. Patient is on coumadin. Hx of a fib. Patient is A&O x4.

## 2015-09-26 NOTE — ED Provider Notes (Addendum)
[facility] Emergency Department Provider Note  ____________________________________________   I have reviewed the triage vital signs and the nursing notes.   HISTORY  Chief Complaint Fall    HPI Dawn Burnett is a 82 y.o. female presents after a non-syncopal fall. She uses a walker normally, she was in the bathroom and reached over to turn off the water and turned around and tripped over the laundry hamper. She did not pass out, she remembers the fall she walked out to the ambulance she did hit her head. She denies significant headache. She states her neck is somewhat sore. She has no malocclusion or loose teeth but there is some blood in her mouth.There was no chest pain or shortness of breath before or after the fall. This was not a syncopal event  Past Medical History  Diagnosis Date  . Hypertension   . Arthritis   . GERD (gastroesophageal reflux disease)   . Hiatal hernia   . Thyroid disease   . CHF (congestive heart failure) ([place])   . Atrial fibrillation [facility])     Patient Active Problem List   Diagnosis Date Noted  . Atrial fibrillation with RVR ([place]) 05/21/2015  . 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 Allergic rhinitis 12/07/2014  . Arthritis 12/07/2014  . Atrial fibrillation, controlled ([place]) 12/07/2014  . Arteriosclerosis of coronary artery 12/07/2014  . Essential (primary) hypertension 12/07/2014  . Acid reflux 12/07/2014  . History of prolonged Q-T interval on ECG 12/07/2014  . Blood glucose elevated 12/07/2014  . Adult hypothyroidism 12/07/2014  . Urinary incontinence 12/07/2014  . Neuropathy ([place]) 12/07/2014  . Adiposity 12/07/2014  . Osteopenia 12/07/2014  . HLD (hyperlipidemia) 12/07/2014  . Alteration in bowel elimination: incontinence 12/07/2014  . Hiatal hernia   . Atrial fibrillation ([place])   . Basal cell carcinoma of ear 11/14/2014  . Bladder infection, chronic 06/01/2012  . Urethral caruncle  06/01/2012  . SCC (squamous cell carcinoma), face 06/16/2011  . ABDOMINAL MASS 05/16/2009  . NONSPECIFIC ABN FINDING RAD & OTH EXAM GI TRACT 04/24/2009    Past Surgical History  Procedure Laterality Date  . Cateract extraction    . Breast surgery    . Abdominal hysterectomy    . Knee surgery    . Right total knee replacement    . Right hip surgery    . Appendectomy    . Removal of gastrointestinal stomatic  tumor of stomach  06/19/2009    Current Outpatient Rx  Name  Route  Sig  Dispense  Refill  . acetaminophen (TYLENOL) 500 MG tablet   Oral   Take 500 mg by mouth every 6 (six) hours as needed for mild pain, moderate pain, fever or headache.         Marland Kitchen atorvastatin (LIPITOR) 20 MG tablet   Oral   Take 20 mg by mouth at bedtime.          . Calcium-Vitamin D 600-200 MG-UNIT per tablet   Oral   Take 600 tablets by mouth daily with supper.          . celecoxib (CELEBREX) 200 MG capsule   Oral   Take 1 capsule (200 mg total) by mouth daily as needed. Patient taking differently: Take 200 mg by mouth daily.    30 capsule   5   . desoximetasone (TOPICORT) 0.05 % cream   Topical   Apply topically 2 (two) times daily.   30 g   3   .  diltiazem (CARDIZEM CD) 120 MG 24 hr capsule   Oral   Take 120 mg by mouth daily.         . furosemide (LASIX) 20 MG tablet   Oral   Take 20 mg by mouth daily.          Marland Kitchen gabapentin (NEURONTIN) 600 MG tablet      TAKE 1 TABLET BY MOUTH 3 TIMES A DAY   90 tablet   11     Rx has expired - unused refills remain   . imipramine (TOFRANIL) 25 MG tablet   Oral   Take 25 mg by mouth at bedtime.          Marland Kitchen levothyroxine (SYNTHROID, LEVOTHROID) 50 MCG tablet   Oral   Take 1 tablet (50 mcg total) by mouth daily before breakfast.   90 tablet   2     Please dispense 90 day supply   . metoprolol (LOPRESSOR) 100 MG tablet   Oral   Take 100 mg by mouth 2 (two) times daily.          . Multiple Vitamins-Minerals (CENTRUM  SILVER ULTRA WOMENS PO)   Oral   Take 1 tablet by mouth daily with lunch.          . olmesartan-hydrochlorothiazide (BENICAR HCT) 20-12.5 MG per tablet   Oral   Take 1 tablet by mouth daily.          Marland Kitchen omeprazole (PRILOSEC) 20 MG capsule   Oral   Take 20 mg by mouth 2 (two) times daily before a meal.          . potassium chloride (K-DUR) 10 MEQ tablet   Oral   Take 10 mEq by mouth daily.          . ranitidine (ZANTAC) 150 MG tablet   Oral   Take 150 mg by mouth 2 (two) times daily before a meal.          . warfarin (COUMADIN) 4 MG tablet   Oral   Take 4 mg by mouth See admin instructions. Every evening on Monday, Wednesday, and Friday         . warfarin (COUMADIN) 5 MG tablet   Oral   Take 5 mg by mouth See admin instructions. Every evening on Sunday, Tuesday, Thursday, and Saturday           Allergies Iodinated diagnostic agents; Augmentin; Codeine; Crestor; Naproxen; Prednisone; Sulfa antibiotics; Sulfacetamide sodium; and Tolectin  Family History  Problem Relation Age of Onset  . Heart disease Mother   . Hypertension Mother   . Heart attack Mother   . Stomach cancer Father   . Liver cancer Brother   . Hypertension Son   . Huntington's disease Sister   . Diabetes Sister   . Kidney disease Sister   . Congestive Heart Failure Sister   . Pneumonia Sister   . Stroke Brother   . Parkinson's disease Brother   . Hypertension Brother   . Congestive Heart Failure Brother     Social History Social History  Substance Use Topics  . Smoking status: Never Smoker   . Smokeless tobacco: Never Used  . Alcohol Use: No    Review of Systems Constitutional: No fever/chills Eyes: No visual changes. ENT: No sore throat. No stiff neck no neck pain Cardiovascular: Denies chest pain. Respiratory: Denies shortness of breath. Gastrointestinal:   no vomiting.  No diarrhea.  No constipation. Genitourinary: Negative for dysuria. Musculoskeletal: Negative lower  extremity swelling Skin: Negative for rash. Neurological: Negative for headaches, focal weakness or numbness. 10-point ROS otherwise negative.  ____________________________________________   PHYSICAL EXAM:  VITAL SIGNS: ED Triage Vitals  Enc Vitals Group     BP 09/26/15 1624 157/83 mmHg     Pulse Rate 09/26/15 1624 86     Resp 09/26/15 1624 18     Temp 09/26/15 1624 97.6 F (36.4 C)     Temp Source 09/26/15 1624 Oral     SpO2 09/26/15 1624 96 %     Weight 09/26/15 1624 188 lb (85.276 kg)     Height 09/26/15 1624 5\' 9"  (1.753 m)     Head Cir --      Peak Flow --      Pain Score 09/26/15 1618 3     Pain Loc --      Pain Edu? --      Excl. in [place]? --     Constitutional: Alert and oriented. Well appearing and in no acute distress. Eyes: Conjunctivae are normal. PERRL. EOMI. Head: Atraumatic. Nose: No congestion/rhinnorhea. Mouth/Throat: Mucous membranes are moist.  Oropharynx non-erythematous. There is trace blood in the mouth but no obvious source. There is a little bit of tenderness above the right gum but no evidence of acute laceration or bleeding, no loose teeth are noted. No malocclusion. She has some tenderness to her nose but no significant bruising and no septal hematoma Neck: No stridor.   Is no midline tenderness but there is some paraspinal tenderness. Cardiovascular: Normal rate, regular rhythm. Grossly normal heart sounds.  Good peripheral circulation. Respiratory: Normal respiratory effort.  No retractions. Lungs CTAB. Abdominal: Soft and nontender. No distention. No guarding no rebound Back:  There is no focal tenderness or step off there is no midline tenderness there are no lesions noted. there is no CVA tenderness Musculoskeletal: No lower extremity tenderness. No joint effusions, no DVT signs strong distal pulses no edema Neurologic:  Normal speech and language. No gross focal neurologic deficits are appreciated.  Skin:  Skin is warm, dry and intact. Region to  left forehead noted Psychiatric: Mood and affect are normal. Speech and behavior are normal.  ____________________________________________   LABS (all labs ordered are listed, but only abnormal results are displayed)  Labs Reviewed  BASIC METABOLIC PANEL - Abnormal; Notable for the following:    Potassium 3.4 (*)    Chloride 100 (*)    Glucose, Bld 221 (*)    BUN 22 (*)    Creatinine, Ser 1.05 (*)    GFR calc non Af Amer 47 (*)    GFR calc Af Amer 55 (*)    All other components within normal limits  PROTIME-INR - Abnormal; Notable for the following:    Prothrombin Time 21.8 (*)    All other components within normal limits  CBC WITH DIFFERENTIAL/PLATELET  APTT   ____________________________________________  EKG  I personally interpreted any EKGs ordered by me or triage  ____________________________________________  RADIOLOGY  I reviewed any imaging ordered by me or triage that were performed during my shift ____________________________________________   PROCEDURES  Procedure(s) performed: None  Critical Care performed: None  ____________________________________________   INITIAL IMPRESSION / ASSESSMENT AND PLAN / ED COURSE  Pertinent labs & imaging results that were available during my care of the patient were reviewed by me and considered in my medical decision making (see chart for details).  Non-syncopal fall, I would normally not do blood work on this patient however she is  on Coumadin and did hit her head. For this reason, we'll check INR and platelets to ensure that if she does have a bleed weekend stratify her risk. We will obtain CT of the head and face and neck for these reasons as well. Patient is very well appearing otherwise. No evidence of extremity fracture and this was a non-syncopal fall.  ----------------------------------------- 6:54 PM on 09/26/2015 -----------------------------------------  Tertiary exam shows no missed injuries at this  time. She has some mild swelling to her nose but is able to breathe well through it. There is no evidence of delayed head bleed. Patient is neurologically intact. No complaints of pain. Does not wish offered pain medication. Is with her family and will follow-up with PCP. Return precautions and follow-up given and understood ____________________________________________   FINAL CLINICAL IMPRESSION(S) / ED DIAGNOSES  Final diagnoses:  Closed head injury  Fall, initial encounter  Abrasion      This chart was dictated using voice recognition software.  Despite best efforts to proofread,  errors can occur which can change meaning.     Schuyler Amor, MD 09/26/15 [place], MD 09/26/15 [place], MD 09/26/15 [phone]

## 2015-09-30 ENCOUNTER — Ambulatory Visit (INDEPENDENT_AMBULATORY_CARE_PROVIDER_SITE_OTHER): Payer: Medicare Other | Admitting: Family Medicine

## 2015-09-30 VITALS — BP 122/60 | HR 76 | Temp 98.4°F | Resp 16 | Wt 196.0 lb

## 2015-09-30 DIAGNOSIS — I4891 Unspecified atrial fibrillation: Secondary | ICD-10-CM | POA: Diagnosis not present

## 2015-09-30 DIAGNOSIS — W19XXXD Unspecified fall, subsequent encounter: Secondary | ICD-10-CM

## 2015-09-30 DIAGNOSIS — Z09 Encounter for follow-up examination after completed treatment for conditions other than malignant neoplasm: Secondary | ICD-10-CM | POA: Diagnosis not present

## 2015-09-30 NOTE — Progress Notes (Signed)
Patient ID: Dawn Burnett, female   DOB: 05/28/1929, 82 y.o.   MRN: [id number]   Dawn Burnett  MRN: [id number] DOB: [birth year]  Subjective:  HPI   1. Hospital discharge follow-up   2. Fall, subsequent encounter Patient states she fell 4 days ago.  She states she was in her bathroom and went to pick up her laundry basket and thinks her foot got tangled on the basket causing her to fall forward.  She states she fell directly on her face into the corner of the room.  She did not have any LOC and was able to get up and hit her Lif[facility] button.   Fire department and sheriff arrived prior to the ambulance getting there.  While the fire department was checking the patient her blood pressure was elevated (she does not recall the actual reading but diastolic was over [id number]).  By the time she got into the ambulance it was 153/71.  Patient was brought to the ED and had facial, head and neck x-rays and all were negative.  The patient states she did not actually hurt anything although she has some significant bruising.  Her INR was checked in the ED and it was 1.9, patient is currently on 4 mg of coumadin daily.  Incidental note;  The patient is requesting refill on her Topicort cream.   She also mentioned that she has appointment for PT check in 2 days but since she just had it done in the ED wondered if she needed to come in for it again.   The patient is also scheduled for 3 appointments with dermatology starting on March 1.  She has weekly appointment for removal of some skin cancers.  She was instructed to contact their office and be sure they are aware that she is on Coumadin and ask if any adjustments needed to be made with the Coumadin for the procedures.   Fall Risk  09/30/2015 09/30/2015  Falls in the past year? Yes Yes  Number falls in past yr: 1 1  Injury with Fall? No No  Risk for fall due to : Impaired balance/gait -  Follow up Education provided -   This information is given referring to the  current fall.  She has not had any falls other than this one in the last year.  She does have some increased risk of fall due to her need to use her walker.  She states she does use her walker around the house all the time.    Patient Active Problem List   Diagnosis Date Noted  . Atrial fibrillation with RVR ([place]) 05/21/2015  . 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 Allergic rhinitis 12/07/2014  . Arthritis 12/07/2014  . Atrial fibrillation, controlled ([place]) 12/07/2014  . Arteriosclerosis of coronary artery 12/07/2014  . Essential (primary) hypertension 12/07/2014  . Acid reflux 12/07/2014  . History of prolonged Q-T interval on ECG 12/07/2014  . Blood glucose elevated 12/07/2014  . Adult hypothyroidism 12/07/2014  . Urinary incontinence 12/07/2014  . Neuropathy ([place]) 12/07/2014  . Adiposity 12/07/2014  . Osteopenia 12/07/2014  . HLD (hyperlipidemia) 12/07/2014  . Alteration in bowel elimination: incontinence 12/07/2014  . Hiatal hernia   . Atrial fibrillation ([place])   . Basal cell carcinoma of ear 11/14/2014  . Bladder infection, chronic 06/01/2012  . Urethral caruncle 06/01/2012  . SCC (squamous cell carcinoma), face 06/16/2011  . ABDOMINAL MASS 05/16/2009  . NONSPECIFIC ABN FINDING RAD & OTH EXAM GI TRACT 04/24/2009  Past Medical History  Diagnosis Date  . Hypertension   . Arthritis   . GERD (gastroesophageal reflux disease)   . Hiatal hernia   . Thyroid disease   . CHF (congestive heart failure) ([place])   . Atrial fibrillation [facility])     Social History   Social History  . Marital Status: Widowed    Spouse Name: widowed  . Number of Children: 2  . Years of Education: 12   Occupational History  . retired    Social History Main Topics  . Smoking status: Never Smoker   . Smokeless tobacco: Never Used  . Alcohol Use: No  . Drug Use: No  . Sexual Activity: No   Other Topics Concern  . Not on file   Social History Narrative     Outpatient Prescriptions Prior to Visit  Medication Sig Dispense Refill  . acetaminophen (TYLENOL) 500 MG tablet Take 500 mg by mouth every 6 (six) hours as needed for mild pain, moderate pain, fever or headache.    Marland Kitchen atorvastatin (LIPITOR) 20 MG tablet Take 20 mg by mouth at bedtime.     . Calcium-Vitamin D 600-200 MG-UNIT per tablet Take 600 tablets by mouth daily with supper.     . celecoxib (CELEBREX) 200 MG capsule Take 1 capsule (200 mg total) by mouth daily as needed. (Patient taking differently: Take 200 mg by mouth daily. ) 30 capsule 5  . desoximetasone (TOPICORT) 0.05 % cream Apply topically 2 (two) times daily. 30 g 3  . diltiazem (CARDIZEM CD) 120 MG 24 hr capsule Take 120 mg by mouth daily.    . furosemide (LASIX) 20 MG tablet Take 20 mg by mouth daily.     Marland Kitchen gabapentin (NEURONTIN) 600 MG tablet TAKE 1 TABLET BY MOUTH 3 TIMES A DAY 90 tablet 11  . imipramine (TOFRANIL) 25 MG tablet Take 25 mg by mouth at bedtime.     Marland Kitchen levothyroxine (SYNTHROID, LEVOTHROID) 50 MCG tablet Take 1 tablet (50 mcg total) by mouth daily before breakfast. 90 tablet 2  . metoprolol (LOPRESSOR) 100 MG tablet Take 100 mg by mouth 2 (two) times daily.     . Multiple Vitamins-Minerals (CENTRUM SILVER ULTRA WOMENS PO) Take 1 tablet by mouth daily with lunch.     . olmesartan-hydrochlorothiazide (BENICAR HCT) 20-12.5 MG per tablet Take 1 tablet by mouth daily.     Marland Kitchen omeprazole (PRILOSEC) 20 MG capsule Take 20 mg by mouth 2 (two) times daily before a meal.     . ranitidine (ZANTAC) 150 MG tablet Take 150 mg by mouth 2 (two) times daily before a meal.     . warfarin (COUMADIN) 4 MG tablet Take 4 mg by mouth See admin instructions. Every evening on Monday, Wednesday, and Friday    . warfarin (COUMADIN) 5 MG tablet Take 5 mg by mouth See admin instructions. Every evening on Sunday, Tuesday, Thursday, and Saturday    . potassium chloride (K-DUR) 10 MEQ tablet Take 10 mEq by mouth daily. Reported on 09/30/2015      No facility-administered medications prior to visit.    Allergies  Allergen Reactions  . Iodinated Diagnostic Agents Rash  . Augmentin [Amoxicillin-Pot Clavulanate] Other (See Comments)    Gi distress   . Codeine   . Crestor [Rosuvastatin] Other (See Comments)    Unknown Unknown  . Naproxen Other (See Comments)    unknown unknown  . Prednisone   . Sulfa Antibiotics   . Sulfacetamide Sodium   . Tolectin [  Tolmetin]     Review of Systems  Constitutional: Negative for fever, chills and malaise/fatigue.  Eyes: Negative.   Respiratory: Negative for shortness of breath.   Cardiovascular: Positive for palpitations (chronic due to atrial fibrillation). Negative for chest pain, orthopnea and leg swelling.  Gastrointestinal: Negative.   Skin: Negative.   Neurological: Negative for dizziness, weakness and headaches.  Psychiatric/Behavioral: Negative.    Objective:  BP 122/60 mmHg  Pulse 76  Temp(Src) 98.4 F (36.9 C) (Oral)  Resp 16  Wt 196 lb (88.905 kg)  Physical Exam  Constitutional: She is oriented to person, place, and time and well-developed, well-nourished, and in no distress.  HENT:  Head: Normocephalic and atraumatic.  Right Ear: External ear normal.  Left Ear: External ear normal.  Nose: Nose normal.  Eyes: Conjunctivae are normal. Pupils are equal, round, and reactive to light.  Neck: Normal range of motion. Neck supple.  Cardiovascular: Normal rate.   Pulmonary/Chest: Effort normal and breath sounds normal.  Abdominal: Soft.  Neurological: She is alert and oriented to person, place, and time. Gait normal.  She  moves slowly and has  been slightly unsteady on her feet for several years.  Skin: Skin is warm and dry.  Psychiatric: Mood, memory, affect and judgment normal.    Assessment and Plan :   1. Hospital discharge follow-up   2. Fall, subsequent encounter He has multiple contusions but overall is feeling better. Is advised to hydrate  aggressively. [facility] physical therapy referral due to future fall risk.  3. Atrial fibrillation, unspecified Follow up in 3 weeks for PT/INR 4. Osteoarthritis I have done the exam and reviewed the above chart and it is accurate to the best of my knowledge. 5. Essential hypertension 6. CAD 7. Neuropathy Contributes to fall risk.No Effective treatment however. I have done the exam and reviewed the above chart and it is accurate to the best of my knowledge.   Miguel Aschoff MD [facility] 09/30/2015 10:07 AM

## 2015-10-02 ENCOUNTER — Other Ambulatory Visit: Payer: Self-pay

## 2015-10-02 ENCOUNTER — Ambulatory Visit: Payer: Medicare Other

## 2015-10-02 MED ORDER — NYSTATIN 100000 UNIT/GM EX POWD
CUTANEOUS | Status: DC
Start: 2015-10-02 — End: 2015-12-24

## 2015-10-10 ENCOUNTER — Other Ambulatory Visit: Payer: Self-pay | Admitting: Family Medicine

## 2015-10-16 ENCOUNTER — Ambulatory Visit: Payer: Medicare Other | Admitting: Podiatry

## 2015-10-23 ENCOUNTER — Ambulatory Visit: Payer: Medicare Other

## 2015-10-23 ENCOUNTER — Ambulatory Visit (INDEPENDENT_AMBULATORY_CARE_PROVIDER_SITE_OTHER): Payer: Medicare Other

## 2015-10-23 DIAGNOSIS — I4891 Unspecified atrial fibrillation: Secondary | ICD-10-CM

## 2015-10-23 LAB — POCT INR
INR: 2.2
PT: 26

## 2015-10-23 NOTE — Patient Instructions (Signed)
Anticoagulation Dose Instructions as of 10/23/2015      Dawn Burnett Tue Wed Thu Fri Sat   New Dose 4 mg 4 mg 4 mg 4 mg 4 mg 4 mg 4 mg    Description        Continue same dose, coumadin 4 mg Daily. Call if any questions or concerns. Come back in 4 weeks.

## 2015-10-24 ENCOUNTER — Other Ambulatory Visit: Payer: Self-pay | Admitting: Family Medicine

## 2015-10-24 NOTE — Telephone Encounter (Signed)
Would recommend the patient discontinue this because of the Coumadin. Puts her at high risk of ulcers. Try Tylenol instead for the pain.

## 2015-10-24 NOTE — Telephone Encounter (Signed)
Dr. Rosanna Randy we discussed this with patient in September and tried her on tylenol and other milder medications and she did not tolerate some of the medications and Tylenol did not help her at all she had severe joint pain and she wanted to continue with celebrex even knowing the risk factors. Please review-aa

## 2015-10-25 ENCOUNTER — Other Ambulatory Visit: Payer: Self-pay | Admitting: Family Medicine

## 2015-10-28 ENCOUNTER — Ambulatory Visit (INDEPENDENT_AMBULATORY_CARE_PROVIDER_SITE_OTHER): Payer: Medicare Other | Admitting: Podiatry

## 2015-10-28 ENCOUNTER — Other Ambulatory Visit: Payer: Self-pay | Admitting: Family Medicine

## 2015-10-28 DIAGNOSIS — M79676 Pain in unspecified toe(s): Secondary | ICD-10-CM | POA: Diagnosis not present

## 2015-10-28 DIAGNOSIS — B351 Tinea unguium: Secondary | ICD-10-CM

## 2015-10-28 MED ORDER — CELECOXIB 200 MG PO CAPS: 200.0000 mg | ORAL_CAPSULE | Freq: Every day | ORAL | Status: DC

## 2015-10-28 NOTE — Progress Notes (Signed)
She presents today with chief complaint of painful elongated toenails have her nails cut.   Objective: Vital signs are stable she is alert and oriented 3. Her toenails are thick yellow dystrophic onychomycotic painful on palpation as well as debridement.  Assessment: Pain in limb secondary to onychomycosis 1 through 5 bilateral.  Plan: debridement of toenails 1 through 5 bilateral.

## 2015-10-28 NOTE — Telephone Encounter (Signed)
lmtcb-looks like patient taking this medication different from what we had so want to double check on directions.-aa

## 2015-10-28 NOTE — Telephone Encounter (Signed)
Pt called saying she was having a problem getting the celabrax.  She says she has to get that particular medication.  Please call her back.  Her call back is  [phone]  Thanks Con Memos

## 2015-10-28 NOTE — Telephone Encounter (Signed)
Dr. Rosanna Randy we discussed this with patient in September and tried her on tylenol and other milder medications and she did not tolerate some of the medications and Tylenol did not help her at all she had severe joint pain and she wanted to continue with celebrex even knowing the risk factors. Please review-aa

## 2015-10-28 NOTE — Telephone Encounter (Signed)
Okay to refill. Please document the risk of bleeding ulcers hass been discussed along with kidney damage

## 2015-10-28 NOTE — Telephone Encounter (Signed)
Pt advised, pt understands the risks but her pain is too severe without this medication, other medication does not control as good.-aa

## 2015-10-30 ENCOUNTER — Other Ambulatory Visit: Payer: Self-pay | Admitting: Family Medicine

## 2015-11-08 ENCOUNTER — Ambulatory Visit (INDEPENDENT_AMBULATORY_CARE_PROVIDER_SITE_OTHER): Payer: Medicare Other | Admitting: Family Medicine

## 2015-11-08 ENCOUNTER — Encounter: Payer: Self-pay | Admitting: Family Medicine

## 2015-11-08 VITALS — BP 110/70 | HR 84 | Temp 98.1°F | Resp 16

## 2015-11-08 DIAGNOSIS — L03114 Cellulitis of left upper limb: Secondary | ICD-10-CM | POA: Diagnosis not present

## 2015-11-08 DIAGNOSIS — J3089 Other allergic rhinitis: Secondary | ICD-10-CM | POA: Diagnosis not present

## 2015-11-08 MED ORDER — DOXYCYCLINE HYCLATE 100 MG PO TABS: 100.0000 mg | ORAL_TABLET | Freq: Two times a day (BID) | ORAL | Status: DC

## 2015-11-08 MED ORDER — FLUTICASONE PROPIONATE 50 MCG/ACT NA SUSP: 2.0000 | Freq: Every day | NASAL | Status: DC

## 2015-11-08 NOTE — Progress Notes (Signed)
Patient: Dawn Burnett Female    DOB: 1929/02/12   82 y.o.   MRN: [id number] Visit Date: 11/08/2015  Today's Provider: Lelon Huh, MD   Chief Complaint  Patient presents with  . Cellulitis   Subjective:    HPI  Left forearm is red and inflamed, x 2days. Hot to the touch. Arm is very sore, from elbow to the wrist. She was seen at urgent care in the past for similar symptoms on the right arm which rapidly resolved with doxycyline.   Patient also has some nasal congestion and drainage. Occasional right ear pain for the last couple of weeks.    Allergies  Allergen Reactions  . Iodinated Diagnostic Agents Rash  . Augmentin [Amoxicillin-Pot Clavulanate] Other (See Comments)    Gi distress   . Codeine   . Crestor [Rosuvastatin] Other (See Comments)    Unknown Unknown  . Naproxen Other (See Comments)    unknown unknown  . Prednisone   . Sulfa Antibiotics   . Sulfacetamide Sodium   . Tolectin [Tolmetin]    Previous Medications   ACETAMINOPHEN (TYLENOL) 500 MG TABLET    Take 500 mg by mouth every 6 (six) hours as needed for mild pain, moderate pain, fever or headache.   ATORVASTATIN (LIPITOR) 20 MG TABLET    Take 20 mg by mouth at bedtime.    CALCIUM-VITAMIN D 600-200 MG-UNIT PER TABLET    Take 600 tablets by mouth daily with supper.    CELECOXIB (CELEBREX) 200 MG CAPSULE    Take 1 capsule (200 mg total) by mouth daily.   COUMADIN 4 MG TABLET    TAKE 1 TABLET AS DIRECTED   DESOXIMETASONE (TOPICORT) 0.05 % CREAM    Apply topically 2 (two) times daily.   DILTIAZEM (CARDIZEM CD) 120 MG 24 HR CAPSULE    Take 120 mg by mouth daily.   FUROSEMIDE (LASIX) 20 MG TABLET    Take 20 mg by mouth daily.    GABAPENTIN (NEURONTIN) 600 MG TABLET    TAKE 1 TABLET BY MOUTH 3 TIMES A DAY   IMIPRAMINE (TOFRANIL) 25 MG TABLET    Take 25 mg by mouth at bedtime.    LEVOTHYROXINE (SYNTHROID, LEVOTHROID) 50 MCG TABLET    Take 1 tablet (50 mcg total) by mouth daily before breakfast.   METOPROLOL  (LOPRESSOR) 100 MG TABLET    Take 100 mg by mouth 2 (two) times daily.    MULTIPLE VITAMINS-MINERALS (CENTRUM SILVER ULTRA WOMENS PO)    Take 1 tablet by mouth daily with lunch.    NYSTATIN (MYCOSTATIN/NYSTOP) 100000 UNIT/GM POWD    Apply topically as needed   OLMESARTAN-HYDROCHLOROTHIAZIDE (BENICAR HCT) 20-12.5 MG PER TABLET    Take 1 tablet by mouth daily.    OMEPRAZOLE (PRILOSEC) 20 MG CAPSULE    Take 20 mg by mouth 2 (two) times daily before a meal.    POTASSIUM CHLORIDE (K-DUR) 10 MEQ TABLET    Take 10 mEq by mouth daily. Reported on 09/30/2015   RANITIDINE (ZANTAC) 150 MG TABLET    TAKE 1 TABLET BY MOUTH TWO TIMES DAILY   WARFARIN (COUMADIN) 4 MG TABLET    Take 4 mg by mouth See admin instructions. Every evening on Monday, Wednesday, and Friday   WARFARIN (COUMADIN) 5 MG TABLET    Take 5 mg by mouth See admin instructions. Every evening on Sunday, Tuesday, Thursday, and Saturday    Review of Systems  Constitutional: Negative for chills and  appetite change.  HENT: Positive for congestion and ear pain.        Right ear pain occasionally  Respiratory: Negative for chest tightness.   Cardiovascular: Negative for chest pain and palpitations.  Gastrointestinal: Negative for nausea and abdominal pain.  Neurological: Negative for dizziness and weakness.    Social History  Substance Use Topics  . Smoking status: Never Smoker   . Smokeless tobacco: Never Used  . Alcohol Use: No   Objective:   BP 110/70 mmHg  Pulse 84  Temp(Src) 98.1 F (36.7 C) (Oral)  Resp 16  SpO2 96%  Physical Exam  General Appearance:    Alert, cooperative, no distress  HENT:   right TM fluid noted, neck without nodes, sinuses nontender, post nasal drip noted and nasal mucosa pale and congested  Eyes:    PERRL, conjunctiva/corneas clear, EOM's intact       Lungs:     Clear to auscultation bilaterally, respirations unlabored  Heart:    Regular rate and rhythm  Neurologic:   Awake, alert, oriented x 3. No  apparent focal neurological           defect.   Derm:   Faint, but well defines area of erythema medial and volar aspect of left forearm. Mildly tender to touch. No trauma or other sores.        Assessment & Plan:     1. Cellulitis of left upper extremity Similar to previous episodes of cellulitis which responded well to doxycycline - doxycycline (VIBRA-TABS) 100 MG tablet; Take 1 tablet (100 mg total) by mouth 2 (two) times daily.  Dispense: 20 tablet; Refill: 0  2. Other allergic rhinitis  - fluticasone (FLONASE) 50 MCG/ACT nasal spray; Place 2 sprays into both nostrils daily.  Dispense: 16 g; Refill: 6  Call if symptoms change or if not rapidly improving.          Lelon Huh, MD  [facility]

## 2015-11-12 ENCOUNTER — Encounter: Payer: Self-pay | Admitting: Family Medicine

## 2015-11-12 ENCOUNTER — Ambulatory Visit (INDEPENDENT_AMBULATORY_CARE_PROVIDER_SITE_OTHER): Payer: Medicare Other | Admitting: Family Medicine

## 2015-11-12 VITALS — BP 122/64 | HR 86 | Temp 97.9°F | Resp 16 | Wt 192.0 lb

## 2015-11-12 DIAGNOSIS — L723 Sebaceous cyst: Secondary | ICD-10-CM | POA: Diagnosis not present

## 2015-11-12 DIAGNOSIS — L089 Local infection of the skin and subcutaneous tissue, unspecified: Secondary | ICD-10-CM

## 2015-11-12 DIAGNOSIS — L03114 Cellulitis of left upper limb: Secondary | ICD-10-CM

## 2015-11-12 MED ORDER — CEPHALEXIN 500 MG PO CAPS: 500.0000 mg | ORAL_CAPSULE | Freq: Three times a day (TID) | ORAL | Status: AC

## 2015-11-12 NOTE — Progress Notes (Signed)
Patient: Dawn Burnett Female    DOB: [birth year]   82 y.o.   MRN: [id number] Visit Date: 11/12/2015  Today's Provider: Lelon Huh, MD   Chief Complaint  Patient presents with  . Arm Pain   Subjective:    Arm Pain  Incident onset: 6 days ago. There was no injury mechanism. Pain location: left lower arm. The quality of the pain is described as burning. The pain does not radiate. The pain is moderate. The pain has been constant since the incident. Pertinent negatives include no chest pain.  Patient was last seen 11/08/2015 for Cellulitis of the left arm. Patient treated with Doxycycline. Patient reports good compliance with treatment and good tolerance. Patient states she has a hard tender knot on her left  Arm. She is not sure if it was there on Friday. Patient is concerned because she has a history of fatty tumors. She reports the knot on her arm is different from other fatty tumors. Her arm is still sore and a little warm, but is much less swollen and much less stiff since was seen 4 days ago.      Allergies  Allergen Reactions  . Iodinated Diagnostic Agents Rash  . Augmentin [Amoxicillin-Pot Clavulanate] Other (See Comments)    Gi distress   . Codeine   . Crestor [Rosuvastatin] Other (See Comments)    Unknown Unknown  . Naproxen Other (See Comments)    unknown unknown  . Prednisone   . Sulfa Antibiotics   . Sulfacetamide Sodium   . Tolectin [Tolmetin]    Previous Medications   ACETAMINOPHEN (TYLENOL) 500 MG TABLET    Take 500 mg by mouth every 6 (six) hours as needed for mild pain, moderate pain, fever or headache.   ATORVASTATIN (LIPITOR) 20 MG TABLET    Take 20 mg by mouth at bedtime.    CALCIUM-VITAMIN D 600-200 MG-UNIT PER TABLET    Take 600 tablets by mouth daily with supper.    CELECOXIB (CELEBREX) 200 MG CAPSULE    Take 1 capsule (200 mg total) by mouth daily.   COUMADIN 4 MG TABLET    TAKE 1 TABLET AS DIRECTED   DESOXIMETASONE (TOPICORT) 0.05 % CREAM     Apply topically 2 (two) times daily.   DILTIAZEM (CARDIZEM CD) 120 MG 24 HR CAPSULE    Take 120 mg by mouth daily.   DOXYCYCLINE (VIBRA-TABS) 100 MG TABLET    Take 1 tablet (100 mg total) by mouth 2 (two) times daily.   FLUTICASONE (FLONASE) 50 MCG/ACT NASAL SPRAY    Place 2 sprays into both nostrils daily.   FUROSEMIDE (LASIX) 20 MG TABLET    Take 20 mg by mouth daily.    GABAPENTIN (NEURONTIN) 600 MG TABLET    TAKE 1 TABLET BY MOUTH 3 TIMES A DAY   IMIPRAMINE (TOFRANIL) 25 MG TABLET    Take 25 mg by mouth at bedtime.    LEVOTHYROXINE (SYNTHROID, LEVOTHROID) 50 MCG TABLET    Take 1 tablet (50 mcg total) by mouth daily before breakfast.   METOPROLOL (LOPRESSOR) 100 MG TABLET    Take 100 mg by mouth 2 (two) times daily.    MULTIPLE VITAMINS-MINERALS (CENTRUM SILVER ULTRA WOMENS PO)    Take 1 tablet by mouth daily with lunch.    NYSTATIN (MYCOSTATIN/NYSTOP) 100000 UNIT/GM POWD    Apply topically as needed   OLMESARTAN-HYDROCHLOROTHIAZIDE (BENICAR HCT) 20-12.5 MG PER TABLET    Take 1 tablet by mouth daily.  OMEPRAZOLE (PRILOSEC) 20 MG CAPSULE    Take 20 mg by mouth 2 (two) times daily before a meal.    POTASSIUM CHLORIDE (K-DUR) 10 MEQ TABLET    Take 10 mEq by mouth daily. Reported on 09/30/2015   RANITIDINE (ZANTAC) 150 MG TABLET    TAKE 1 TABLET BY MOUTH TWO TIMES DAILY   WARFARIN (COUMADIN) 4 MG TABLET    Take 4 mg by mouth See admin instructions. Every evening on Monday, Wednesday, and Friday   WARFARIN (COUMADIN) 5 MG TABLET    Take 5 mg by mouth See admin instructions. Every evening on Sunday, Tuesday, Thursday, and Saturday    Review of Systems  Constitutional: Negative for fever, chills, diaphoresis, appetite change and fatigue.  Respiratory: Negative for chest tightness and shortness of breath.   Cardiovascular: Negative for chest pain and palpitations.  Gastrointestinal: Negative for nausea, vomiting and abdominal pain.  Musculoskeletal:       Swelling and pain in left lower arm    Skin: Positive for color change.       Redness on left arm  Neurological: Negative for dizziness and weakness.    Social History  Substance Use Topics  . Smoking status: Never Smoker   . Smokeless tobacco: Never Used  . Alcohol Use: No   Objective:   BP 122/64 mmHg  Pulse 86  Temp(Src) 97.9 F (36.6 C) (Oral)  Resp 16  Wt 192 lb (87.091 kg)  SpO2 93%  Physical Exam  General appearance: alert, well developed, well nourished, cooperative and in no distress  Extremities: Faint, but greatly improved erythema of left arm. Marble sized firm tender subcutaneous nodule lateral aspect of mid forearm. No open wounds or discharge. Not erythematous.      Assessment & Plan:     1. Infected sebaceous cyst of skin This has likely been there all along, but not appreciated at last visit due to swelling of surrounding forearm. Will add cephalexin to doxycycline - cephALEXin (KEFLEX) 500 MG capsule; Take 1 capsule (500 mg total) by mouth 3 (three) times daily.  Dispense: 21 capsule; Refill: 0  2. Cellulitis of left upper extremity Improving. Continue doxy and add cephalexin as above  Call if symptoms change or if not rapidly improving.             Lelon Huh, MD  [facility]

## 2015-11-21 ENCOUNTER — Ambulatory Visit (INDEPENDENT_AMBULATORY_CARE_PROVIDER_SITE_OTHER): Payer: Medicare Other | Admitting: Family Medicine

## 2015-11-21 ENCOUNTER — Encounter: Payer: Self-pay | Admitting: Family Medicine

## 2015-11-21 VITALS — BP 112/62 | HR 78 | Temp 97.9°F | Resp 16

## 2015-11-21 DIAGNOSIS — I4891 Unspecified atrial fibrillation: Secondary | ICD-10-CM

## 2015-11-21 DIAGNOSIS — R2232 Localized swelling, mass and lump, left upper limb: Secondary | ICD-10-CM | POA: Diagnosis not present

## 2015-11-21 DIAGNOSIS — L03114 Cellulitis of left upper limb: Secondary | ICD-10-CM | POA: Diagnosis not present

## 2015-11-21 LAB — POCT INR
INR: 2.6
PT: 31.8

## 2015-11-21 MED ORDER — CEPHALEXIN 500 MG PO CAPS: 500.0000 mg | ORAL_CAPSULE | Freq: Three times a day (TID) | ORAL | Status: DC

## 2015-11-21 NOTE — Patient Instructions (Addendum)
Take antibiotic if area gets red again. Call if you need to be seen again tomorrow.  Continue same dose, coumadin 4 mg Daily.  Recheck in four weeks.

## 2015-11-21 NOTE — Progress Notes (Signed)
Patient ID: Dawn Burnett, female   DOB: 1929/01/09, 82 y.o.   MRN: [id number]       Patient: Dawn Burnett Female    DOB: 06-Dec-1928   82 y.o.   MRN: [id number] Visit Date: 11/21/2015  Today's Provider: Margarita Rana, MD   Chief Complaint  Patient presents with  . Cyst    on left forearm   Subjective:    HPI Pt is here for a cyst on her left forearm. She has been seen by Dr. Caryn Section twice for this on 11/07/15 and 11/12/15, was first treated as cellulitis with Doxy, then again within that week because this cyst came up and then she was treated with Keflex on top of the doxy. She is here today because the cyst is still there. The redness and infection seems to be gone but the cyst is still there. The cyst is hard and sometimes painful to palpate in certain areas.   Also needs her PT checked. Has been taking her medication regularly.  Has not active bleeding.        Allergies  Allergen Reactions  . Iodinated Diagnostic Agents Rash  . Augmentin [Amoxicillin-Pot Clavulanate] Other (See Comments)    Gi distress   . Codeine   . Crestor [Rosuvastatin] Other (See Comments)    Unknown Unknown  . Naproxen Other (See Comments)    unknown unknown  . Prednisone   . Sulfa Antibiotics   . Sulfacetamide Sodium   . Tolectin [Tolmetin]    Previous Medications   ACETAMINOPHEN (TYLENOL) 500 MG TABLET    Take 500 mg by mouth every 6 (six) hours as needed for mild pain, moderate pain, fever or headache.   ATORVASTATIN (LIPITOR) 20 MG TABLET    Take 20 mg by mouth at bedtime.    CALCIUM-VITAMIN D 600-200 MG-UNIT PER TABLET    Take 600 tablets by mouth daily with supper.    CELECOXIB (CELEBREX) 200 MG CAPSULE    Take 1 capsule (200 mg total) by mouth daily.   COUMADIN 4 MG TABLET    TAKE 1 TABLET AS DIRECTED   DESOXIMETASONE (TOPICORT) 0.05 % CREAM    Apply topically 2 (two) times daily.   DILTIAZEM (CARDIZEM CD) 120 MG 24 HR CAPSULE    Take 120 mg by mouth daily.   DOXYCYCLINE (VIBRA-TABS) 100 MG  TABLET    Take 1 tablet (100 mg total) by mouth 2 (two) times daily.   FLUTICASONE (FLONASE) 50 MCG/ACT NASAL SPRAY    Place 2 sprays into both nostrils daily.   FUROSEMIDE (LASIX) 20 MG TABLET    Take 20 mg by mouth daily.    GABAPENTIN (NEURONTIN) 600 MG TABLET    TAKE 1 TABLET BY MOUTH 3 TIMES A DAY   IMIPRAMINE (TOFRANIL) 25 MG TABLET    Take 25 mg by mouth at bedtime.    LEVOTHYROXINE (SYNTHROID, LEVOTHROID) 50 MCG TABLET    Take 1 tablet (50 mcg total) by mouth daily before breakfast.   METOPROLOL (LOPRESSOR) 100 MG TABLET    Take 100 mg by mouth 2 (two) times daily.    MULTIPLE VITAMINS-MINERALS (CENTRUM SILVER ULTRA WOMENS PO)    Take 1 tablet by mouth daily with lunch.    NYSTATIN (MYCOSTATIN/NYSTOP) 100000 UNIT/GM POWD    Apply topically as needed   OLMESARTAN-HYDROCHLOROTHIAZIDE (BENICAR HCT) 20-12.5 MG PER TABLET    Take 1 tablet by mouth daily.    OMEPRAZOLE (PRILOSEC) 20 MG CAPSULE    Take 20 mg  by mouth 2 (two) times daily before a meal.    POTASSIUM CHLORIDE (K-DUR) 10 MEQ TABLET    Take 10 mEq by mouth daily. Reported on 09/30/2015   RANITIDINE (ZANTAC) 150 MG TABLET    TAKE 1 TABLET BY MOUTH TWO TIMES DAILY   WARFARIN (COUMADIN) 4 MG TABLET    Take 4 mg by mouth See admin instructions. Every evening on Monday, Wednesday, and Friday   WARFARIN (COUMADIN) 5 MG TABLET    Take 5 mg by mouth See admin instructions. Every evening on Sunday, Tuesday, Thursday, and Saturday    Review of Systems  Constitutional: Negative.   HENT: Negative.   Eyes: Negative.   Respiratory: Negative.   Cardiovascular: Negative.   Gastrointestinal: Negative.   Endocrine: Negative.   Genitourinary: Negative.   Musculoskeletal: Negative.   Skin: Negative.   Allergic/Immunologic: Negative.   Neurological: Negative.   Hematological: Negative.   Psychiatric/Behavioral: Negative.     Social History  Substance Use Topics  . Smoking status: Never Smoker   . Smokeless tobacco: Never Used  . Alcohol  Use: No   Objective:   BP 112/62 mmHg  Pulse 78  Temp(Src) 97.9 F (36.6 C) (Oral)  Resp 16  Physical Exam  Constitutional: She appears well-developed and well-nourished.  Skin: No erythema.  3.5cm soft nodule, non tender, no erythema  Psychiatric: She has a normal mood and affect. Her behavior is normal. Judgment and thought content normal.   Anticoagulation Dose Instructions as of 11/21/2015      Dorene Grebe Tue Wed Thu Fri Sat   New Dose 4 mg 4 mg 4 mg 4 mg 4 mg 4 mg 4 mg    Description        Continue same dose, coumadin 4 mg Daily.  Recheck in four weeks.            Assessment & Plan:     1. Atrial fibrillation, unspecified Stable. Continue current dose and plan of care. Recheck in one month.  - POCT INR Results for orders placed or performed in visit on 11/21/15  POCT INR  Result Value Ref Range   INR 2.6    PT 31.8    2. Cellulitis of left upper extremity Improved.  Will refill in case redness recurs. Call if needs to take antibiotic again.   - cephALEXin (KEFLEX) 500 MG capsule; Take 1 capsule (500 mg total) by mouth 3 (three) times daily.  Dispense: 21 capsule; Refill: 0   3. Skin lump of arm, left Suspect hematoma rather than cyst, but is unclear. Patient is on coumadin. Does know and trust Dr. Jamal Collin. Will have him assess next week.   - Ambulatory referral to General Surgery     Patient was seen and examined by Dr. Margarita Rana, and noted scribed by Webb Laws, [place].  I have reviewed the document for accuracy and completeness and I agree with above. - Jerrell Belfast, MD    Margarita Rana, MD  [facility]

## 2015-11-22 ENCOUNTER — Ambulatory Visit: Payer: Medicare Other

## 2015-11-25 ENCOUNTER — Ambulatory Visit (INDEPENDENT_AMBULATORY_CARE_PROVIDER_SITE_OTHER): Payer: Medicare Other | Admitting: Family Medicine

## 2015-11-25 ENCOUNTER — Encounter: Payer: Self-pay | Admitting: *Deleted

## 2015-11-25 ENCOUNTER — Ambulatory Visit
Admission: RE | Admit: 2015-11-25 | Discharge: 2015-11-25 | Disposition: A | Discharge status: Home / Self Care | Payer: Medicare Other | Source: Ambulatory Visit | Attending: Family Medicine | Admitting: Family Medicine

## 2015-11-25 VITALS — BP 118/56 | HR 80 | Temp 98.2°F | Resp 14

## 2015-11-25 DIAGNOSIS — C44310 Basal cell carcinoma of skin of unspecified parts of face: Secondary | ICD-10-CM | POA: Diagnosis not present

## 2015-11-25 DIAGNOSIS — I4891 Unspecified atrial fibrillation: Secondary | ICD-10-CM

## 2015-11-25 DIAGNOSIS — X58XXXS Exposure to other specified factors, sequela: Secondary | ICD-10-CM | POA: Diagnosis not present

## 2015-11-25 DIAGNOSIS — S40022S Contusion of left upper arm, sequela: Secondary | ICD-10-CM | POA: Diagnosis not present

## 2015-11-25 DIAGNOSIS — C44300 Unspecified malignant neoplasm of skin of unspecified part of face: Secondary | ICD-10-CM

## 2015-11-25 NOTE — Progress Notes (Signed)
Patient ID: Dawn Burnett, female   DOB: March 04, 1929, 82 y.o.   MRN: [id number]    Subjective:  HPI  Patient has been here 3 times to follow up on a nodule on her left lower forearm. It is raised, tender to the touch. NO redness. Patient saw Dr. Caryn Section on 4/4 and then again on 4/11-first diagnoses was cellulitis and she completed antibiotic, then a cyst and when she saw Dr. Venia Minks on the 13th the doctor told patient she is really not sure what this is. Patient has an appointment with Dr. Jamal Collin on April 25th per Dr. Cathie Hoops orders. Patient has taking Doxy and Keflex. Patient states the the nodule has not gotten bigger from what she can tell.  Prior to Admission medications   Medication Sig Start Date End Date Taking? Authorizing Provider  acetaminophen (TYLENOL) 500 MG tablet Take 500 mg by mouth every 6 (six) hours as needed for mild pain, moderate pain, fever or headache.    Historical Provider, MD  atorvastatin (LIPITOR) 20 MG tablet Take 20 mg by mouth at bedtime.     Historical Provider, MD  Calcium-Vitamin D 600-200 MG-UNIT per tablet Take 600 tablets by mouth daily with supper.     Historical Provider, MD  celecoxib (CELEBREX) 200 MG capsule Take 1 capsule (200 mg total) by mouth daily. 10/28/15   Richard Maceo Pro., MD  cephALEXin (KEFLEX) 500 MG capsule Take 1 capsule (500 mg total) by mouth 3 (three) times daily. 11/21/15   Margarita Rana, MD  COUMADIN 4 MG tablet TAKE 1 TABLET AS DIRECTED 10/31/15   Jerrol Banana., MD  desoximetasone (TOPICORT) 0.05 % cream Apply topically 2 (two) times daily. 12/04/13   Max T Hyatt, DPM  diltiazem (CARDIZEM CD) 120 MG 24 hr capsule Take 120 mg by mouth daily.    Historical Provider, MD  doxycycline (VIBRA-TABS) 100 MG tablet Take 1 tablet (100 mg total) by mouth 2 (two) times daily. Patient not taking: Reported on 11/21/2015 11/08/15   Birdie Sons, MD  fluticasone [facility]) 50 MCG/ACT nasal spray Place 2 sprays into both nostrils daily. 11/08/15    Birdie Sons, MD  furosemide (LASIX) 20 MG tablet Take 20 mg by mouth daily.     Historical Provider, MD  gabapentin (NEURONTIN) 600 MG tablet TAKE 1 TABLET BY MOUTH 3 TIMES A DAY 05/14/15   Richard Maceo Pro., MD  imipramine (TOFRANIL) 25 MG tablet Take 25 mg by mouth at bedtime.     Historical Provider, MD  levothyroxine (SYNTHROID, LEVOTHROID) 50 MCG tablet Take 1 tablet (50 mcg total) by mouth daily before breakfast. 08/22/15   Jerrol Banana., MD  metoprolol (LOPRESSOR) 100 MG tablet Take 100 mg by mouth 2 (two) times daily.     Historical Provider, MD  Multiple Vitamins-Minerals (CENTRUM SILVER ULTRA WOMENS PO) Take 1 tablet by mouth daily with lunch.     Historical Provider, MD  nystatin (MYCOSTATIN/NYSTOP) 100000 UNIT/GM POWD Apply topically as needed 10/02/15   Jerrol Banana., MD  olmesartan-hydrochlorothiazide (BENICAR HCT) 20-12.5 MG per tablet Take 1 tablet by mouth daily.     Historical Provider, MD  omeprazole (PRILOSEC) 20 MG capsule Take 20 mg by mouth 2 (two) times daily before a meal.     Historical Provider, MD  potassium chloride (K-DUR) 10 MEQ tablet Take 10 mEq by mouth daily. Reported on 09/30/2015 06/17/15   Historical Provider, MD  ranitidine (ZANTAC) 150 MG tablet TAKE 1 TABLET  BY MOUTH TWO TIMES DAILY 10/10/15   Jerrol Banana., MD  warfarin (COUMADIN) 4 MG tablet Take 4 mg by mouth See admin instructions. Every evening on Monday, Wednesday, and Friday    Historical Provider, MD  warfarin (COUMADIN) 5 MG tablet Take 5 mg by mouth See admin instructions. Every evening on Sunday, Tuesday, Thursday, and Saturday    Historical Provider, MD    Patient Active Problem List   Diagnosis Date Noted  . 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 Allergic rhinitis 12/07/2014  . Arthritis 12/07/2014  . Atrial fibrillation, controlled ([place]) 12/07/2014  . Arteriosclerosis of coronary artery 12/07/2014  . Essential (primary) hypertension  12/07/2014  . Acid reflux 12/07/2014  . History of prolonged Q-T interval on ECG 12/07/2014  . Blood glucose elevated 12/07/2014  . Adult hypothyroidism 12/07/2014  . Urinary incontinence 12/07/2014  . Neuropathy ([place]) 12/07/2014  . Adiposity 12/07/2014  . Osteopenia 12/07/2014  . HLD (hyperlipidemia) 12/07/2014  . Alteration in bowel elimination: incontinence 12/07/2014  . Hiatal hernia   . Atrial fibrillation ([place])   . Basal cell carcinoma of ear 11/14/2014  . Bladder infection, chronic 06/01/2012  . Urethral caruncle 06/01/2012  . SCC (squamous cell carcinoma), face 06/16/2011  . ABDOMINAL MASS 05/16/2009  . NONSPECIFIC ABN FINDING RAD & OTH EXAM GI TRACT 04/24/2009    Past Medical History  Diagnosis Date  . Hypertension   . Arthritis   . GERD (gastroesophageal reflux disease)   . Hiatal hernia   . Thyroid disease   . CHF (congestive heart failure) ([place])   . Atrial fibrillation [facility])     Social History   Social History  . Marital Status: Widowed    Spouse Name: widowed  . Number of Children: 2  . Years of Education: 12   Occupational History  . retired    Social History Main Topics  . Smoking status: Never Smoker   . Smokeless tobacco: Never Used  . Alcohol Use: No  . Drug Use: No  . Sexual Activity: No   Other Topics Concern  . Not on file   Social History Narrative    Allergies  Allergen Reactions  . Iodinated Diagnostic Agents Rash  . Augmentin [Amoxicillin-Pot Clavulanate] Other (See Comments)    Gi distress   . Codeine   . Crestor [Rosuvastatin] Other (See Comments)    Unknown Unknown  . Naproxen Other (See Comments)    unknown unknown  . Prednisone   . Sulfa Antibiotics   . Sulfacetamide Sodium   . Tolectin [Tolmetin]     Review of Systems  Constitutional: Positive for malaise/fatigue.  Eyes: Negative.   Respiratory: Negative.   Cardiovascular: Positive for leg swelling. Negative for chest pain and palpitations.    Musculoskeletal: Positive for joint pain.  Neurological: Positive for weakness.  Endo/Heme/Allergies: Negative.   Psychiatric/Behavioral: Negative.     Immunization History  Administered Date(s) Administered  . Influenza,inj,Quad PF,36+ Mos 05/22/2015  . Pneumococcal Conjugate-13 07/10/2015  . Td 06/20/2004   Objective:  BP 118/56 mmHg  Pulse 80  Temp(Src) 98.2 F (36.8 C)  Resp 14  Wt   Physical Exam  Constitutional: She is well-developed, well-nourished, and in no distress.  HENT:  Head: Normocephalic and atraumatic.  Right Ear: External ear normal.  Left Ear: External ear normal.  Nose: Nose normal.  Eyes: Conjunctivae are normal.  Neck: Neck supple.  Cardiovascular: Normal rate, regular rhythm, normal heart sounds and intact distal pulses.   No  murmur heard. Pulmonary/Chest: Effort normal and breath sounds normal. No respiratory distress. She has no wheezes.  Abdominal: Soft.  Neurological: She is alert.  Skin: Skin is warm and dry.  Psychiatric: Mood, memory, affect and judgment normal.    Lab Results  Component Value Date   WBC 8.3 09/26/2015   HGB 14.4 09/26/2015   HCT 42.2 09/26/2015   PLT 159 09/26/2015   GLUCOSE 221* 09/26/2015   CHOL 181 12/11/2013   TRIG 376* 12/11/2013   HDL 43 12/11/2013   LDLCALC 63 12/11/2013   TSH 2.701 05/21/2015   INR 2.6 11/21/2015   HGBA1C 6.9 08/22/2015    CMP     Component Value Date/Time   NA 142 09/26/2015 1624   NA 144 07/10/2014 1022   NA 146 06/14/2013   K 3.4* 09/26/2015 1624   K 4.3 07/10/2014 1022   CL 100* 09/26/2015 1624   CL 102 07/10/2014 1022   CO2 32 09/26/2015 1624   CO2 33* 07/10/2014 1022   GLUCOSE 221* 09/26/2015 1624   GLUCOSE 134* 07/10/2014 1022   BUN 22* 09/26/2015 1624   BUN 18 07/10/2014 1022   BUN 23* 06/14/2013   CREATININE 1.05* 09/26/2015 1624   CREATININE 1.07 07/10/2014 1022   CREATININE 1.0 06/14/2013   CALCIUM 9.5 09/26/2015 1624   CALCIUM 9.5 07/10/2014 1022   PROT  6.4* 07/09/2015 1050   PROT 6.8 07/10/2014 1022   ALBUMIN 3.9 07/09/2015 1050   ALBUMIN 3.7 07/10/2014 1022   AST 21 07/09/2015 1050   AST 22 07/10/2014 1022   ALT 14 07/09/2015 1050   ALT 24 07/10/2014 1022   ALKPHOS 48 07/09/2015 1050   ALKPHOS 60 07/10/2014 1022   BILITOT 1.2 07/09/2015 1050   BILITOT 1.0 07/10/2014 1022   GFRNONAA 47* 09/26/2015 1624   GFRNONAA 52* 07/10/2014 1022   GFRNONAA 44* 12/25/2013 1036   GFRAA 55* 09/26/2015 1624   GFRAA >60 07/10/2014 1022   GFRAA 52* 12/25/2013 1036    Assessment and Plan :  1. Hematoma of arm, left, sequela I think this was an infected hematoma and now this is a calcify hematoma. Non infected today. Pain is present and bothersome to the patient. Not sure how this develop. Patient is on coumadin. Keep appointment with Dr. Jamal Collin on 12/03/15. Follow as needed. Will get an Xray to make sure nothing else is the cause. Advised patient to use some heat to the area.  2. Atrial fibrillation with RVR ([place])  3. Skin cancer of face Per patient scheduled to have skin cancer taking off on Wednesday 4/19. Advised patient to ask dermatologist about the nodule also.   Patient was seen and examined by Dr. Eulas Post and note was scribed by Theressa Millard, RMA.  Miguel Aschoff MD [facility] 11/25/2015 3:16 PM

## 2015-11-26 ENCOUNTER — Telehealth: Payer: Self-pay

## 2015-11-26 NOTE — Telephone Encounter (Signed)
Left message to call back  

## 2015-11-26 NOTE — Telephone Encounter (Signed)
-----   Message from Jerrol Banana., MD sent at 11/25/2015  4:32 PM EDT ----- Normal x-ray.

## 2015-11-26 NOTE — Telephone Encounter (Signed)
Advised patient of results.  

## 2015-12-03 ENCOUNTER — Ambulatory Visit (INDEPENDENT_AMBULATORY_CARE_PROVIDER_SITE_OTHER): Payer: Medicare Other | Admitting: General Surgery

## 2015-12-03 ENCOUNTER — Encounter: Payer: Self-pay | Admitting: General Surgery

## 2015-12-03 VITALS — BP 150/80 | HR 78 | Resp 14 | Ht 69.0 in | Wt 191.0 lb

## 2015-12-03 DIAGNOSIS — R229 Localized swelling, mass and lump, unspecified: Secondary | ICD-10-CM | POA: Diagnosis not present

## 2015-12-03 NOTE — Progress Notes (Signed)
Patient ID: Dawn Burnett, female   DOB: [birth year], 82 y.o.   MRN: [id number]  Chief Complaint  Patient presents with  . Mass    left arm    HPI Dawn Burnett is a 82 y.o. female.  Here today for evaluation of a left arm mass which has been present for 4-5 weeks. She had cellulitis prior to the knot formation. She has had 2 different antibiotics for the cellulitis. She states it has not changed in size. I have reviewed the history of present illness with the patient.   HPI  Past Medical History  Diagnosis Date  . Hypertension   . Arthritis   . GERD (gastroesophageal reflux disease)   . Hiatal hernia   . Thyroid disease   . CHF (congestive heart failure) ([place])   . Atrial fibrillation [facility])     Past Surgical History  Procedure Laterality Date  . Cateract extraction    . Breast surgery    . Abdominal hysterectomy    . Knee surgery    . Right total knee replacement    . Right hip surgery    . Appendectomy    . Removal of gastrointestinal stomatic  tumor of stomach  06/19/2009    Family History  Problem Relation Age of Onset  . Heart disease Mother   . Hypertension Mother   . Heart attack Mother   . Stomach cancer Father   . Liver cancer Brother   . Hypertension Son   . Huntington's disease Sister   . Diabetes Sister   . Kidney disease Sister   . Congestive Heart Failure Sister   . Pneumonia Sister   . Stroke Brother   . Parkinson's disease Brother   . Hypertension Brother   . Congestive Heart Failure Brother     Social History Social History  Substance Use Topics  . Smoking status: Never Smoker   . Smokeless tobacco: Never Used  . Alcohol Use: No    Allergies  Allergen Reactions  . Iodinated Diagnostic Agents Rash  . Augmentin [Amoxicillin-Pot Clavulanate] Other (See Comments)    Gi distress   . Codeine   . Crestor [Rosuvastatin] Other (See Comments)    Unknown Unknown  . Naproxen Other (See Comments)    unknown unknown  . Prednisone   . Sulfa  Antibiotics   . Sulfacetamide Sodium   . Tolectin [Tolmetin]     Current Outpatient Prescriptions  Medication Sig Dispense Refill  . acetaminophen (TYLENOL) 500 MG tablet Take 500 mg by mouth every 6 (six) hours as needed for mild pain, moderate pain, fever or headache.    Marland Kitchen atorvastatin (LIPITOR) 20 MG tablet Take 20 mg by mouth at bedtime.     . Calcium-Vitamin D 600-200 MG-UNIT per tablet Take 600 tablets by mouth daily with supper.     . celecoxib (CELEBREX) 200 MG capsule Take 1 capsule (200 mg total) by mouth daily. 30 capsule 12  . COUMADIN 4 MG tablet TAKE 1 TABLET AS DIRECTED 30 tablet 12  . diltiazem (CARDIZEM CD) 120 MG 24 hr capsule Take 120 mg by mouth daily.    . furosemide (LASIX) 20 MG tablet Take 20 mg by mouth daily.     Marland Kitchen gabapentin (NEURONTIN) 600 MG tablet TAKE 1 TABLET BY MOUTH 3 TIMES A DAY 90 tablet 11  . imipramine (TOFRANIL) 25 MG tablet Take 25 mg by mouth at bedtime.     Marland Kitchen levothyroxine (SYNTHROID, LEVOTHROID) 50 MCG tablet Take 1 tablet (  50 mcg total) by mouth daily before breakfast. 90 tablet 2  . metoprolol (LOPRESSOR) 100 MG tablet Take 100 mg by mouth 2 (two) times daily.     . Multiple Vitamins-Minerals (CENTRUM SILVER ULTRA WOMENS PO) Take 1 tablet by mouth daily with lunch.     . olmesartan-hydrochlorothiazide (BENICAR HCT) 20-12.5 MG per tablet Take 1 tablet by mouth daily.     Marland Kitchen omeprazole (PRILOSEC) 20 MG capsule Take 20 mg by mouth 2 (two) times daily before a meal.     . ranitidine (ZANTAC) 150 MG tablet TAKE 1 TABLET BY MOUTH TWO TIMES DAILY 60 tablet 12  . desoximetasone (TOPICORT) 0.05 % cream Apply topically 2 (two) times daily. 30 g 3  . fluticasone (FLONASE) 50 MCG/ACT nasal spray Place 2 sprays into both nostrils daily. 16 g 6  . nystatin (MYCOSTATIN/NYSTOP) 100000 UNIT/GM POWD Apply topically as needed 30 g 1  . potassium chloride (K-DUR) 10 MEQ tablet Take 10 mEq by mouth daily. Reported on 09/30/2015     No current facility-administered  medications for this visit.    Review of Systems Review of Systems  Constitutional: Negative.   Respiratory: Negative.   Cardiovascular: Negative.     Blood pressure 150/80, pulse 78, resp. rate 14, height 5\' 9"  (1.753 m), weight 191 lb (86.637 kg).  Physical Exam Physical Exam  Constitutional: She is oriented to person, place, and time. Vital signs are normal. She appears well-developed and well-nourished.  Eyes: Conjunctivae are normal.  Abdominal: Soft. Normal appearance. She exhibits no distension.  Lymphadenopathy:    She has no cervical adenopathy.    She has no axillary adenopathy.  Neurological: She is alert and oriented to person, place, and time.  Skin: Skin is warm and dry.     Vitals reviewed.   Data Reviewed   Assessment Mass on proximal left forearm over the left Ulna. Mass is approx 3 x 2 cm. This is new according to pt.Second mass anterior left forearm, has been there a long time. Neithr mass ppear suspicious. No need for any intervention at present. Recheck in 3 weeks or sooner if there is any new symptoms.    Plan    Patient to place a pillow on the side of her chair to relieve pressure from the area. Patient to follow up in 3 weeks     PCP:  Rosanna Randy  This information has been scribed by Karie Fetch RN, BSN,BC.   SANKAR,SEEPLAPUTHUR G 12/03/2015, 7:00 PM

## 2015-12-03 NOTE — Patient Instructions (Signed)
The patient is aware to call back for any questions or concerns.  

## 2015-12-06 ENCOUNTER — Other Ambulatory Visit: Payer: Self-pay | Admitting: Family Medicine

## 2015-12-10 ENCOUNTER — Encounter: Payer: Self-pay | Admitting: General Surgery

## 2015-12-18 ENCOUNTER — Ambulatory Visit (INDEPENDENT_AMBULATORY_CARE_PROVIDER_SITE_OTHER): Payer: Medicare Other

## 2015-12-18 DIAGNOSIS — I4891 Unspecified atrial fibrillation: Secondary | ICD-10-CM

## 2015-12-18 LAB — POCT INR
INR: 2.1
PT: 25

## 2015-12-18 NOTE — Patient Instructions (Signed)
Anticoagulation Dose Instructions as of 12/18/2015      Dorene Grebe Tue Wed Thu Fri Sat   New Dose 4 mg 4 mg 4 mg 4 mg 4 mg 4 mg 4 mg    Description        Continue same dose, coumadin 4 mg Daily.  Recheck in four weeks.

## 2015-12-24 ENCOUNTER — Encounter: Payer: Self-pay | Admitting: Family Medicine

## 2015-12-24 ENCOUNTER — Ambulatory Visit (INDEPENDENT_AMBULATORY_CARE_PROVIDER_SITE_OTHER): Payer: Medicare Other | Admitting: Family Medicine

## 2015-12-24 VITALS — BP 108/62 | HR 80 | Temp 98.4°F | Resp 14 | Ht 66.5 in | Wt 194.0 lb

## 2015-12-24 DIAGNOSIS — I10 Essential (primary) hypertension: Secondary | ICD-10-CM | POA: Diagnosis not present

## 2015-12-24 DIAGNOSIS — I4891 Unspecified atrial fibrillation: Secondary | ICD-10-CM

## 2015-12-24 DIAGNOSIS — Z0000 Encounter for general adult medical examination without abnormal findings: Secondary | ICD-10-CM | POA: Diagnosis not present

## 2015-12-24 DIAGNOSIS — E038 Other specified hypothyroidism: Secondary | ICD-10-CM | POA: Diagnosis not present

## 2015-12-24 DIAGNOSIS — E119 Type 2 diabetes mellitus without complications: Secondary | ICD-10-CM

## 2015-12-24 DIAGNOSIS — E785 Hyperlipidemia, unspecified: Secondary | ICD-10-CM

## 2015-12-24 NOTE — Progress Notes (Signed)
Patient ID: Dawn Burnett, female   DOB: 03-23-1929, 82 y.o.   MRN: [id number]  Visit Date: 12/24/2015  Today's Provider: [name], MD   Chief Complaint  Patient presents with  . Medicare Wellness   Subjective:   Dawn Burnett is a 82 y.o. female who presents today for her Subsequent Annual Wellness Visit. She feels fairly well. She reports exercising no due ortho and neurological issues. She reports she is sleeping fairly well. Immunization History  Administered Date(s) Administered  . Influenza,inj,Quad PF,36+ Mos 05/22/2015  . Pneumococcal Conjugate-13 07/10/2015  . Td 06/20/2004   She is unsteady using the walker but has not fallen recently. Overall she feels fairly well. Chronic arthritic pain as the patient daily. She has chronic fatigue which is slowly worsened through the years. She has no new bowel issues. No pelvic pain or vaginal bleeding Last Colonoscopy 05/04/06 tubular adenoma, repeat 04/2010  Endoscopy 04/18/09 hyperplastic polyps, mild chronic gastritis, no need to repeat again.  BMD 03/27/13 osteopenia  Mammogram 03/27/13   Review of Systems  Constitutional: Positive for fatigue.  HENT: Positive for congestion, hearing loss, rhinorrhea, trouble swallowing and voice change.   Eyes: Positive for photophobia and redness.  Respiratory: Positive for shortness of breath.   Cardiovascular: Positive for palpitations and leg swelling.  Gastrointestinal: Positive for diarrhea.  Endocrine: Negative.   Genitourinary: Positive for frequency.  Musculoskeletal: Positive for myalgias and arthralgias.  Skin: Negative.   Allergic/Immunologic: Negative.   Neurological: Positive for weakness, numbness and headaches.  Hematological: Negative.   Psychiatric/Behavioral: Negative.     Patient Active Problem List   Diagnosis Date Noted  . 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 Allergic rhinitis 12/07/2014  . Arthritis 12/07/2014  . Atrial fibrillation,  controlled ([place]) 12/07/2014  . Arteriosclerosis of coronary artery 12/07/2014  . Essential (primary) hypertension 12/07/2014  . Acid reflux 12/07/2014  . History of prolonged Q-T interval on ECG 12/07/2014  . Blood glucose elevated 12/07/2014  . Adult hypothyroidism 12/07/2014  . Urinary incontinence 12/07/2014  . Neuropathy ([place]) 12/07/2014  . Adiposity 12/07/2014  . Osteopenia 12/07/2014  . HLD (hyperlipidemia) 12/07/2014  . Alteration in bowel elimination: incontinence 12/07/2014  . Hiatal hernia   . Atrial fibrillation ([place])   . Basal cell carcinoma of ear 11/14/2014  . Bladder infection, chronic 06/01/2012  . Urethral caruncle 06/01/2012  . SCC (squamous cell carcinoma), face 06/16/2011  . ABDOMINAL MASS 05/16/2009  . NONSPECIFIC ABN FINDING RAD & OTH EXAM GI TRACT 04/24/2009    Social History   Social History  . Marital Status: Widowed    Spouse Name: widowed  . Number of Children: 2  . Years of Education: 12   Occupational History  . retired    Social History Main Topics  . Smoking status: Never Smoker   . Smokeless tobacco: Never Used  . Alcohol Use: No  . Drug Use: No  . Sexual Activity: No   Other Topics Concern  . Not on file   Social History Narrative    Past Surgical History  Procedure Laterality Date  . Cateract extraction    . Breast surgery    . Abdominal hysterectomy    . Knee surgery    . Right total knee replacement    . Right hip surgery    . Appendectomy    . Removal of gastrointestinal stomatic  tumor of stomach  06/19/2009    Her family history includes Congestive Heart Failure in her brother  and sister; Diabetes in her sister; Heart attack in her mother; Heart disease in her mother; Huntington's disease in her sister; Hypertension in her brother, mother, and son; Kidney disease in her sister; Liver cancer in her brother; Parkinson's disease in her brother; Pneumonia in her sister; Stomach cancer in her father; Stroke in her brother.     Outpatient Prescriptions Prior to Visit  Medication Sig Dispense Refill  . acetaminophen (TYLENOL) 500 MG tablet Take 500 mg by mouth every 6 (six) hours as needed for mild pain, moderate pain, fever or headache.    Marland Kitchen atorvastatin (LIPITOR) 20 MG tablet Take 20 mg by mouth at bedtime.     . Calcium-Vitamin D 600-200 MG-UNIT per tablet Take 600 tablets by mouth daily with supper.     . celecoxib (CELEBREX) 200 MG capsule Take 1 capsule (200 mg total) by mouth daily. 30 capsule 12  . COUMADIN 4 MG tablet TAKE 1 TABLET AS DIRECTED 30 tablet 12  . diltiazem (CARDIZEM CD) 120 MG 24 hr capsule Take 120 mg by mouth daily.    . furosemide (LASIX) 20 MG tablet Take 20 mg by mouth daily.     Marland Kitchen gabapentin (NEURONTIN) 600 MG tablet TAKE 1 TABLET BY MOUTH 3 TIMES A DAY 90 tablet 11  . imipramine (TOFRANIL) 25 MG tablet Take 25 mg by mouth at bedtime.     Marland Kitchen levothyroxine (SYNTHROID, LEVOTHROID) 50 MCG tablet Take 1 tablet (50 mcg total) by mouth daily before breakfast. 90 tablet 2  . metoprolol (LOPRESSOR) 100 MG tablet Take 100 mg by mouth 2 (two) times daily.     . Multiple Vitamins-Minerals (CENTRUM SILVER ULTRA WOMENS PO) Take 1 tablet by mouth daily with lunch.     Marland Kitchen omeprazole (PRILOSEC) 20 MG capsule Take 20 mg by mouth 2 (two) times daily before a meal.     . ranitidine (ZANTAC) 150 MG tablet TAKE 1 TABLET BY MOUTH TWO TIMES DAILY 60 tablet 12  . desoximetasone (TOPICORT) 0.05 % cream Apply topically 2 (two) times daily. (Patient not taking: Reported on 12/24/2015) 30 g 3  . fluticasone (FLONASE) 50 MCG/ACT nasal spray Place 2 sprays into both nostrils daily. (Patient not taking: Reported on 12/24/2015) 16 g 6  . potassium chloride (K-DUR) 10 MEQ tablet Take 10 mEq by mouth daily. Reported on 12/24/2015    . nystatin (MYCOSTATIN/NYSTOP) 100000 UNIT/GM POWD Apply topically as needed (Patient not taking: Reported on 12/24/2015) 30 g 1  . olmesartan-hydrochlorothiazide (BENICAR HCT) 20-12.5 MG per tablet  Take 1 tablet by mouth daily.      No facility-administered medications prior to visit.    Allergies  Allergen Reactions  . Iodinated Diagnostic Agents Rash  . Augmentin [Amoxicillin-Pot Clavulanate] Other (See Comments)    Gi distress   . Codeine   . Crestor [Rosuvastatin] Other (See Comments)    Unknown Unknown  . Naproxen Other (See Comments)    unknown unknown  . Prednisone   . Sulfa Antibiotics   . Sulfacetamide Sodium   . Tolectin [Tolmetin]     Patient Care Team: Jerrol Banana., MD as PCP - General (Family Medicine) Margarita Rana, MD as Referring Physician (Family Medicine) Christene Lye, MD (General Surgery)  Objective:   Vitals:  Filed Vitals:   12/24/15 1038  BP: 108/62  Pulse: 80  Temp: 98.4 F (36.9 C)  Resp: 14  Height: 5' 6.5" (1.689 m)  Weight: 194 lb (87.998 kg)    Physical Exam  Constitutional:  She is oriented to person, place, and time. She appears well-developed and well-nourished.  HENT:  Head: Normocephalic and atraumatic.  Right Ear: External ear normal.  Left Ear: External ear normal.  Nose: Nose normal.  Mouth/Throat: Oropharynx is clear and moist.  Eyes: Conjunctivae are normal. Pupils are equal, round, and reactive to light.  Neck: Normal range of motion. Neck supple.  Cardiovascular: Normal rate, regular rhythm, normal heart sounds and intact distal pulses.   No murmur heard. Pulmonary/Chest: Effort normal and breath sounds normal. No respiratory distress. She has no wheezes. Right breast exhibits no mass, no nipple discharge and no tenderness. Left breast exhibits no mass, no nipple discharge and no tenderness. Breasts are symmetrical.  Abdominal: Soft. She exhibits no distension. There is no tenderness.  Genitourinary:  No DRE, no pelvic exam.  Musculoskeletal: She exhibits no tenderness.  Neurological: She is alert and oriented to person, place, and time.  Using walker due to unsteadiness and chronic joint pain   Skin: No rash noted. No erythema.  Psychiatric: She has a normal mood and affect. Her behavior is normal. Judgment and thought content normal.    Activities of Daily Living In your present state of health, do you have any difficulty performing the following activities: 12/24/2015 05/21/2015  Hearing? Y N  Vision? N N  Difficulty concentrating or making decisions? N N  Walking or climbing stairs? Y Y  Dressing or bathing? Y N  Doing errands, shopping? Aggie [place]    Fall Risk Assessment Fall Risk  12/24/2015 11/21/2015 09/30/2015 09/30/2015  Falls in the past year? Yes Yes Yes Yes  Number falls in past yr: 1 1 1 1   Injury with Fall? Yes No No No  Risk for fall due to : - - Impaired balance/gait -  Follow up Falls evaluation completed - Education provided -     Depression Screen PHQ 2/9 Scores 12/24/2015 11/21/2015  PHQ - 2 Score 0 0    Cognitive Testing - 6-CIT    Year: 0 4 points  Month: 0 3 points  Memorize "Pia Mau, [address], [place]"  Time (within 1 hour:) 0 3 points  Count backwards from 20: 0 2 4 points  Name months of year: 0 2 4 points  Repeat Address: 0 2 4 6 8 10  points   Total Score: 6/28  Interpretation : Normal (0-7) Abnormal (8-28)    Assessment & Plan:     Annual Wellness Visit  Reviewed patient's Family Medical History Reviewed and updated list of patient's medical providers Assessment of cognitive impairment was done Assessed patient's functional ability Established a written schedule for health screening [place] Completed and Reviewed  1. Medicare annual wellness visit, subsequent  2. Atrial fibrillation, unspecified type ([place])  3. Essential (primary) hypertension - CBC with Differential/Platelet - Comprehensive metabolic panel  4. Other specified hypothyroidism - TSH  5. HLD (hyperlipidemia) - Lipid Panel With LDL/HDL Ratio  6. Type 2 diabetes mellitus without complication, without long-term current use of insulin  (HCC) - Hemoglobin A1c I have done the exam and reviewed the above chart and it is accurate to the best of my knowledge.  Patient was seen and examined by Dr. Eulas Post and note was scribed by Theressa Millard, RMA.    Miguel Aschoff MD [facility] 12/24/2015 10:39 AM  ------------------------------------------------------------------------------------------------------------

## 2015-12-25 ENCOUNTER — Ambulatory Visit: Payer: Medicare Other | Admitting: General Surgery

## 2015-12-26 ENCOUNTER — Telehealth: Payer: Self-pay

## 2015-12-26 LAB — CBC WITH DIFFERENTIAL/PLATELET
Basophils Absolute: 0 10*3/uL (ref 0.0–0.2)
Basos: 0 %
EOS (ABSOLUTE): 0.1 10*3/uL (ref 0.0–0.4)
Eos: 2 %
Hematocrit: 40.9 % (ref 34.0–46.6)
Hemoglobin: 13.8 g/dL (ref 11.1–15.9)
Immature Grans (Abs): 0 10*3/uL (ref 0.0–0.1)
Immature Granulocytes: 0 %
Lymphocytes Absolute: 2.4 10*3/uL (ref 0.7–3.1)
Lymphs: 37 %
MCH: 32.5 pg (ref 26.6–33.0)
MCHC: 33.7 g/dL (ref 31.5–35.7)
MCV: 96 fL (ref 79–97)
Monocytes Absolute: 0.4 10*3/uL (ref 0.1–0.9)
Monocytes: 7 %
Neutrophils Absolute: 3.4 10*3/uL (ref 1.4–7.0)
Neutrophils: 54 %
Platelets: 173 10*3/uL (ref 150–379)
RBC: 4.25 x10E6/uL (ref 3.77–5.28)
RDW: 14.3 % (ref 12.3–15.4)
WBC: 6.3 10*3/uL (ref 3.4–10.8)

## 2015-12-26 LAB — COMPREHENSIVE METABOLIC PANEL
ALT: 14 IU/L (ref 0–32)
AST: 19 IU/L (ref 0–40)
Albumin/Globulin Ratio: 1.8 (ref 1.2–2.2)
Albumin: 4.1 g/dL (ref 3.5–4.7)
Alkaline Phosphatase: 59 IU/L (ref 39–117)
BUN/Creatinine Ratio: 15 (ref 12–28)
BUN: 15 mg/dL (ref 8–27)
Bilirubin Total: 0.8 mg/dL (ref 0.0–1.2)
CO2: 28 mmol/L (ref 18–29)
Calcium: 9.1 mg/dL (ref 8.7–10.3)
Chloride: 99 mmol/L (ref 96–106)
Creatinine, Ser: 0.98 mg/dL (ref 0.57–1.00)
GFR calc Af Amer: 61 mL/min/{1.73_m2} (ref >59)
GFR calc non Af Amer: 53 mL/min/{1.73_m2} — ABNORMAL LOW (ref >59)
Globulin, Total: 2.3 g/dL (ref 1.5–4.5)
Glucose: 174 mg/dL — ABNORMAL HIGH (ref 65–99)
Potassium: 4.2 mmol/L (ref 3.5–5.2)
Sodium: 147 mmol/L — ABNORMAL HIGH (ref 134–144)
Total Protein: 6.4 g/dL (ref 6.0–8.5)

## 2015-12-26 LAB — LIPID PANEL WITH LDL/HDL RATIO
Cholesterol, Total: 184 mg/dL (ref 100–199)
HDL: 44 mg/dL (ref >39)
LDL Calculated: 70 mg/dL (ref 0–99)
LDl/HDL Ratio: 1.6 ratio units (ref 0.0–3.2)
Triglycerides: 350 mg/dL — ABNORMAL HIGH (ref 0–149)
VLDL Cholesterol Cal: 70 mg/dL — ABNORMAL HIGH (ref 5–40)

## 2015-12-26 LAB — HEMOGLOBIN A1C
Est. average glucose Bld gHb Est-mCnc: 186 mg/dL
Hgb A1c MFr Bld: 8.1 % — ABNORMAL HIGH (ref 4.8–5.6)

## 2015-12-26 LAB — TSH: TSH: 1.63 u[IU]/mL (ref 0.450–4.500)

## 2015-12-26 NOTE — Telephone Encounter (Signed)
LMT[facility] ED 

## 2015-12-26 NOTE — Telephone Encounter (Signed)
-----   Message from Margarita Rana, MD sent at 12/26/2015  2:28 PM EDT ----- Labs stable except for blood sugar not as good as previous. Follow up as scheduled. Thanks.

## 2015-12-26 NOTE — Telephone Encounter (Signed)
Advised  ED 

## 2016-01-15 ENCOUNTER — Ambulatory Visit (INDEPENDENT_AMBULATORY_CARE_PROVIDER_SITE_OTHER): Payer: Medicare Other

## 2016-01-15 DIAGNOSIS — I4891 Unspecified atrial fibrillation: Secondary | ICD-10-CM

## 2016-01-15 LAB — POCT INR
INR: 2.6
PT: 30.6

## 2016-01-15 NOTE — Patient Instructions (Signed)
Anticoagulation Dose Instructions as of 01/15/2016      Dawn Burnett Tue Wed Thu Fri Sat   New Dose 4 mg 4 mg 4 mg 4 mg 4 mg 4 mg 4 mg    Description        Continue same dose, coumadin 4 mg Daily.  Recheck in four weeks.

## 2016-01-27 ENCOUNTER — Ambulatory Visit
Admission: RE | Admit: 2016-01-27 | Discharge: 2016-01-27 | Disposition: A | Discharge status: Home / Self Care | Payer: Medicare Other | Source: Ambulatory Visit | Attending: Oncology | Admitting: Oncology

## 2016-01-27 ENCOUNTER — Ambulatory Visit: Payer: Medicare Other

## 2016-01-27 DIAGNOSIS — C49A2 Gastrointestinal stromal tumor of stomach: Secondary | ICD-10-CM | POA: Insufficient documentation

## 2016-01-27 HISTORY — DX: Basal cell carcinoma of skin, unspecified: C44.91

## 2016-01-27 MED ORDER — IOPAMIDOL (ISOVUE-300) INJECTION 61%
100.0000 mL | Freq: Once | INTRAVENOUS | Status: AC | PRN
  Administered 2016-01-27: 100 mL via INTRAVENOUS

## 2016-01-28 ENCOUNTER — Ambulatory Visit: Admission: RE | Admit: 2016-01-28 | Payer: Medicare Other | Source: Ambulatory Visit

## 2016-01-29 ENCOUNTER — Inpatient Hospital Stay: Payer: Medicare Other | Attending: Family Medicine | Admitting: Family Medicine

## 2016-01-29 ENCOUNTER — Inpatient Hospital Stay: Payer: Medicare Other

## 2016-01-29 VITALS — BP 136/83 | HR 83 | Temp 98.0°F | Wt 192.0 lb

## 2016-01-29 DIAGNOSIS — I4891 Unspecified atrial fibrillation: Secondary | ICD-10-CM | POA: Diagnosis not present

## 2016-01-29 DIAGNOSIS — I10 Essential (primary) hypertension: Secondary | ICD-10-CM | POA: Insufficient documentation

## 2016-01-29 DIAGNOSIS — I509 Heart failure, unspecified: Secondary | ICD-10-CM | POA: Diagnosis not present

## 2016-01-29 DIAGNOSIS — C49A2 Gastrointestinal stromal tumor of stomach: Secondary | ICD-10-CM

## 2016-01-29 DIAGNOSIS — M1990 Unspecified osteoarthritis, unspecified site: Secondary | ICD-10-CM | POA: Diagnosis not present

## 2016-01-29 DIAGNOSIS — Z79899 Other long term (current) drug therapy: Secondary | ICD-10-CM | POA: Insufficient documentation

## 2016-01-29 DIAGNOSIS — K219 Gastro-esophageal reflux disease without esophagitis: Secondary | ICD-10-CM | POA: Diagnosis not present

## 2016-01-29 LAB — CBC WITH DIFFERENTIAL/PLATELET
Basophils Absolute: 0 10*3/uL (ref 0–0.1)
Basophils Relative: 0 %
Eosinophils Absolute: 0.1 10*3/uL (ref 0–0.7)
Eosinophils Relative: 1 %
HCT: 39.5 % (ref 35.0–47.0)
Hemoglobin: 13.7 g/dL (ref 12.0–16.0)
Lymphocytes Relative: 26 %
Lymphs Abs: 2.5 10*3/uL (ref 1.0–3.6)
MCH: 33.1 pg (ref 26.0–34.0)
MCHC: 34.5 g/dL (ref 32.0–36.0)
MCV: 96 fL (ref 80.0–100.0)
Monocytes Absolute: 0.7 10*3/uL (ref 0.2–0.9)
Monocytes Relative: 8 %
Neutro Abs: 6.4 10*3/uL (ref 1.4–6.5)
Neutrophils Relative %: 65 %
Platelets: 140 10*3/uL — ABNORMAL LOW (ref 150–440)
RBC: 4.12 MIL/uL (ref 3.80–5.20)
RDW: 14.4 % (ref 11.5–14.5)
WBC: 9.7 10*3/uL (ref 3.6–11.0)

## 2016-01-29 LAB — COMPREHENSIVE METABOLIC PANEL
ALT: 19 U/L (ref 14–54)
AST: 25 U/L (ref 15–41)
Albumin: 4.1 g/dL (ref 3.5–5.0)
Alkaline Phosphatase: 51 U/L (ref 38–126)
Anion gap: 7 (ref 5–15)
BUN: 27 mg/dL — ABNORMAL HIGH (ref 6–20)
CO2: 33 mmol/L — ABNORMAL HIGH (ref 22–32)
Calcium: 9.3 mg/dL (ref 8.9–10.3)
Chloride: 99 mmol/L — ABNORMAL LOW (ref 101–111)
Creatinine, Ser: 1 mg/dL (ref 0.44–1.00)
GFR calc Af Amer: 58 mL/min — ABNORMAL LOW (ref >60)
GFR calc non Af Amer: 50 mL/min — ABNORMAL LOW (ref >60)
Glucose, Bld: 171 mg/dL — ABNORMAL HIGH (ref 65–99)
Potassium: 3.8 mmol/L (ref 3.5–5.1)
Sodium: 139 mmol/L (ref 135–145)
Total Bilirubin: 1.1 mg/dL (ref 0.3–1.2)
Total Protein: 6.7 g/dL (ref 6.5–8.1)

## 2016-01-29 NOTE — Progress Notes (Signed)
Patient denies pain or discomfort.  Patient ambulates via walker, brought to exam room 18, accompanied by family.  Medication record updated, information provided by patient.  Dr. Ree Kida

## 2016-01-29 NOTE — Progress Notes (Signed)
[place]  [phone]  [phone]     Dawn Burnett DOB: [birth year]  MR#: [id number]  [id number]#:[id number]  Patient Care Team: Jerrol Banana., MD as PCP - General (Family Medicine) Margarita Rana, MD as Referring Physician (Family Medicine) Seeplaputhur Robinette Haines, MD (General Surgery)  CHIEF COMPLAINT:  Chief Complaint  Patient presents with  . Follow-up    INTERVAL HISTORY:  Patient is here for further follow-up regarding gastrointestinal stromal tumor. Patient had a CT scan performed on the 19th for follow-up. Overall patient reports feeling fairly well and in her normal state of health. She continues to use a rolling walker for ambulation. She also continues to work compression stockings due to lower extremity edema related to congestive heart failure. She denies any complaints of nausea, vomiting, diarrhea, abdominal pain. Overall she reports feeling fairly well and denies any other acute complaints.  REVIEW OF SYSTEMS:   Review of Systems  Constitutional: Positive for malaise/fatigue. Negative for fever, chills, weight loss and diaphoresis.  HENT: Negative.   Eyes: Negative.   Respiratory: Negative for cough, hemoptysis, sputum production, shortness of breath and wheezing.   Cardiovascular: Negative for chest pain, palpitations, orthopnea, claudication, leg swelling and PND.  Gastrointestinal: Negative for heartburn, nausea, vomiting, abdominal pain, diarrhea, constipation, blood in stool and melena.  Genitourinary: Negative.   Musculoskeletal: Negative.   Skin: Negative.   Neurological: Negative for dizziness, tingling, focal weakness, seizures and weakness.  Endo/Heme/Allergies: Does not bruise/bleed easily.  Psychiatric/Behavioral: Negative for depression. The patient is not nervous/anxious and does not have insomnia.     As per HPI. Otherwise, a complete review of systems is negatve.  ONCOLOGY HISTORY: Oncology History   Chief  Complaint/Diagnosis:   1. Gastrointestinal stromal tumor, diagnoses by endoscopy.   2. Ultrasound biopsy on May 29, 2009. 3. Resection of the tumor in November, 2010.     SCC (squamous cell carcinoma), face   06/16/2011 Initial Diagnosis SCC (squamous cell carcinoma), face    PAST MEDICAL HISTORY: Past Medical History  Diagnosis Date  . Hypertension   . Arthritis   . GERD (gastroesophageal reflux disease)   . Hiatal hernia   . Thyroid disease   . CHF (congestive heart failure) ([place])   . Atrial fibrillation ([place])   . Skin cancer, basal cell     PAST SURGICAL HISTORY: Past Surgical History  Procedure Laterality Date  . Cateract extraction    . Breast surgery    . Abdominal hysterectomy    . Knee surgery    . Right total knee replacement    . Right hip surgery    . Appendectomy    . Removal of gastrointestinal stomatic  tumor of stomach  06/19/2009    FAMILY HISTORY Family History  Problem Relation Age of Onset  . Heart disease Mother   . Hypertension Mother   . Heart attack Mother   . Stomach cancer Father   . Liver cancer Brother   . Hypertension Son   . Huntington's disease Sister   . Diabetes Sister   . Kidney disease Sister   . Congestive Heart Failure Sister   . Pneumonia Sister   . Stroke Brother   . Parkinson's disease Brother   . Hypertension Brother   . Congestive Heart Failure Brother     GYNECOLOGIC HISTORY:  No LMP recorded. Patient has had a hysterectomy.     ADVANCED DIRECTIVES:    HEALTH MAINTENANCE: Social History  Substance Use Topics  .  Smoking status: Never Smoker   . Smokeless tobacco: Never Used  . Alcohol Use: No     Allergies  Allergen Reactions  . Iodinated Diagnostic Agents Rash  . Augmentin [Amoxicillin-Pot Clavulanate] Other (See Comments)    Gi distress   . Codeine   . Crestor [Rosuvastatin] Other (See Comments)    Unknown Unknown  . Naproxen Other (See Comments)    unknown unknown  . Prednisone   .  Sulfa Antibiotics   . Sulfacetamide Sodium   . Tolectin [Tolmetin]     Current Outpatient Prescriptions  Medication Sig Dispense Refill  . acetaminophen (TYLENOL) 500 MG tablet Take 500 mg by mouth every 6 (six) hours as needed for mild pain, moderate pain, fever or headache.    Marland Kitchen atorvastatin (LIPITOR) 20 MG tablet Take 20 mg by mouth at bedtime.     . Calcium-Vitamin D 600-200 MG-UNIT per tablet Take 600 tablets by mouth daily with supper.     . celecoxib (CELEBREX) 200 MG capsule Take 1 capsule (200 mg total) by mouth daily. 30 capsule 12  . COUMADIN 4 MG tablet TAKE 1 TABLET AS DIRECTED 30 tablet 12  . desoximetasone (TOPICORT) 0.05 % cream Apply topically 2 (two) times daily. 30 g 3  . diltiazem (CARDIZEM CD) 120 MG 24 hr capsule Take 120 mg by mouth daily.    . fluticasone (FLONASE) 50 MCG/ACT nasal spray Place 2 sprays into both nostrils daily. 16 g 6  . furosemide (LASIX) 20 MG tablet Take 20 mg by mouth daily.     Marland Kitchen gabapentin (NEURONTIN) 600 MG tablet TAKE 1 TABLET BY MOUTH 3 TIMES A DAY 90 tablet 11  . imipramine (TOFRANIL) 25 MG tablet Take 25 mg by mouth at bedtime.     Marland Kitchen levothyroxine (SYNTHROID, LEVOTHROID) 50 MCG tablet Take 1 tablet (50 mcg total) by mouth daily before breakfast. 90 tablet 2  . metoprolol (LOPRESSOR) 100 MG tablet Take 100 mg by mouth 2 (two) times daily.     . Multiple Vitamins-Minerals (CENTRUM SILVER ULTRA WOMENS PO) Take 1 tablet by mouth daily with lunch.     . nystatin cream (MYCOSTATIN) Apply 1 application topically 2 (two) times daily.    Marland Kitchen omeprazole (PRILOSEC) 20 MG capsule Take 20 mg by mouth 2 (two) times daily before a meal.     . potassium chloride (K-DUR) 10 MEQ tablet Take 10 mEq by mouth daily. Reported on 12/24/2015    . ranitidine (ZANTAC) 150 MG tablet TAKE 1 TABLET BY MOUTH TWO TIMES DAILY 60 tablet 12   No current facility-administered medications for this visit.    OBJECTIVE: BP 136/83 mmHg  Pulse 83  Temp(Src) 98 F (36.7 C)  (Tympanic)  Wt 192 lb (87.091 kg)   Body mass index is 30.53 kg/(m^2).    ECOG FS:2 - Symptomatic, <50% confined to bed  General: Well-developed, well-nourished, no acute distress. Using rolling walker for ambulation.  Eyes: Pink conjunctiva, anicteric sclera. Left lower eyelid ectropion.  HEENT: Normocephalic, moist mucous membranes, clear oropharnyx. Lungs: Clear to auscultation bilaterally. Heart: Regular rate and rhythm. No rubs, murmurs, or gallops. Abdomen: Soft, nontender, nondistended. No organomegaly noted, normoactive bowel sounds. Musculoskeletal: No edema, cyanosis, or clubbing. Neuro: Alert, answering all questions appropriately. Cranial nerves grossly intact. Skin: No rashes or petechiae noted. Psych: Normal affect. Lymphatics: No cervical, clavicular LAD.   LAB RESULTS:  Appointment on 01/29/2016  Component Date Value Ref Range Status  . WBC 01/29/2016 9.7  3.6 - 11.0 K/uL  Final  . RBC 01/29/2016 4.12  3.80 - 5.20 MIL/uL Final  . Hemoglobin 01/29/2016 13.7  12.0 - 16.0 g/dL Final  . HCT 01/29/2016 39.5  35.0 - 47.0 % Final  . MCV 01/29/2016 96.0  80.0 - 100.0 fL Final  . MCH 01/29/2016 33.1  26.0 - 34.0 pg Final  . MCHC 01/29/2016 34.5  32.0 - 36.0 g/dL Final  . RDW 01/29/2016 14.4  11.5 - 14.5 % Final  . Platelets 01/29/2016 140* 150 - 440 K/uL Final  . Neutrophils Relative % 01/29/2016 65   Final  . Neutro Abs 01/29/2016 6.4  1.4 - 6.5 K/uL Final  . Lymphocytes Relative 01/29/2016 26   Final  . Lymphs Abs 01/29/2016 2.5  1.0 - 3.6 K/uL Final  . Monocytes Relative 01/29/2016 8   Final  . Monocytes Absolute 01/29/2016 0.7  0.2 - 0.9 K/uL Final  . Eosinophils Relative 01/29/2016 1   Final  . Eosinophils Absolute 01/29/2016 0.1  0 - 0.7 K/uL Final  . Basophils Relative 01/29/2016 0   Final  . Basophils Absolute 01/29/2016 0.0  0 - 0.1 K/uL Final  . Sodium 01/29/2016 139  135 - 145 mmol/L Final  . Potassium 01/29/2016 3.8  3.5 - 5.1 mmol/L Final  . Chloride  01/29/2016 99* 101 - 111 mmol/L Final  . CO2 01/29/2016 33* 22 - 32 mmol/L Final  . Glucose, Bld 01/29/2016 171* 65 - 99 mg/dL Final  . BUN 01/29/2016 27* 6 - 20 mg/dL Final  . Creatinine, Ser 01/29/2016 1.00  0.44 - 1.00 mg/dL Final  . Calcium 01/29/2016 9.3  8.9 - 10.3 mg/dL Final  . Total Protein 01/29/2016 6.7  6.5 - 8.1 g/dL Final  . Albumin 01/29/2016 4.1  3.5 - 5.0 g/dL Final  . AST 01/29/2016 25  15 - 41 U/L Final  . ALT 01/29/2016 19  14 - 54 U/L Final  . Alkaline Phosphatase 01/29/2016 51  38 - 126 U/L Final  . Total Bilirubin 01/29/2016 1.1  0.3 - 1.2 mg/dL Final  . GFR calc non Af Amer 01/29/2016 50* >60 mL/min Final  . GFR calc Af Amer 01/29/2016 58* >60 mL/min Final   Comment: (NOTE) The eGFR has been calculated using the CKD EPI equation. This calculation has not been validated in all clinical situations. eGFR's persistently <60 mL/min signify possible Chronic Kidney Disease.   . Anion gap 01/29/2016 7  5 - 15 Final    STUDIES: Ct Abdomen Pelvis W Contrast  01/27/2016  CLINICAL DATA:  Staging GIST tumor. She states she had surgical resection of tumor in 2010. Pt states she is asymptomatic. EXAM: CT ABDOMEN AND PELVIS WITH CONTRAST TECHNIQUE: Multidetector CT imaging of the abdomen and pelvis was performed using the standard protocol following bolus administration of intravenous contrast. CONTRAST:  138m ISOVUE-300 IOPAMIDOL (ISOVUE-300) INJECTION 61% COMPARISON:  12/17/2014 FINDINGS: Lower chest: Lung bases are clear. Hepatobiliary: No focal hepatic lesion. No biliary duct dilatation. Gallbladder is normal. Common bile duct is normal. Pancreas: Pancreas is normal. No ductal dilatation. No pancreatic inflammation. Spleen: Normal spleen Adrenals/urinary tract: Adrenal glands and kidneys are normal. The ureters and bladder normal. Stomach/Bowel: Stomach is normal. Rounded soft tissue lesion in the gastrosplenic ligament measures 41 x 28 mm compared to 42 x 29 mm remeasured for  no significant change. No new lesions are present. Duodenum small bowel are normal. Appendix and cecum normal. The colon rectosigmoid colon are normal. No mesenteric mass or peritoneal lesion.  No retroperitoneal lesion. Vascular/Lymphatic:  Abdominal aorta is normal caliber with atherosclerotic calcification. There is no retroperitoneal or periportal lymphadenopathy. No pelvic lymphadenopathy. Reproductive: The post hysterectomy Other: No free fluid. Musculoskeletal: RIGHT hip prosthetic.  No aggressive osseous lesion IMPRESSION: 1. Stable rounded soft tissue mass in the gastrosplenic ligament. 2. No evidence of new peritoneal, mesenteric or retroperitoneal lesion. 3. Normal bowel. Electronically Signed   By: Suzy Bouchard M.D.   On: 01/27/2016 12:29    ASSESSMENT:  Gastrointestinal stromal tumor.  PLAN:   1. GIST. Patient is status post resection of the tumor in November 2010. CT scan was performed on 01/27/2016 that revealed a rounded soft tissue lesion in the gastrosplenic ligament that measures 41 mm x 28 mm compared to 42 mm x 29 mm approximately 12 months ago. This area appears to be stable and there are no new lesions present. Clinically patient is doing very well as there is no signs or symptoms of recurrent disease. Patient expressed desire to only return every year and to stop doing CT scans as she's decided she would no longer pursue any active treatment at her age.  Agreed with patient that his CT scans weren't necessary that we would continue with follow-up in approximately 6 months with continued labs and establishing care with a new oncologist.  Patient expressed understanding and was in agreement with this plan. She also understands that She can call clinic at any time with any questions, concerns, or complaints.   Dr. Rogue Bussing was available for consultation and review of plan of care for this patient.   Evlyn Kanner, NP   01/29/2016 3:15 PM

## 2016-02-12 ENCOUNTER — Ambulatory Visit (INDEPENDENT_AMBULATORY_CARE_PROVIDER_SITE_OTHER): Payer: Medicare Other

## 2016-02-12 DIAGNOSIS — I4891 Unspecified atrial fibrillation: Secondary | ICD-10-CM | POA: Diagnosis not present

## 2016-02-12 DIAGNOSIS — E039 Hypothyroidism, unspecified: Secondary | ICD-10-CM

## 2016-02-12 LAB — POCT INR
INR: 1.7
PT: 20.8

## 2016-02-12 MED ORDER — WARFARIN SODIUM 5 MG PO TABS: 5.0000 mg | ORAL_TABLET | Freq: Every day | ORAL | Status: DC

## 2016-02-12 MED ORDER — LEVOTHYROXINE SODIUM 50 MCG PO TABS: 50.0000 ug | ORAL_TABLET | Freq: Every day | ORAL | Status: DC

## 2016-02-12 NOTE — Patient Instructions (Signed)
Anticoagulation Dose Instructions as of 02/12/2016      Dorene Grebe Tue Wed Thu Fri Sat   New Dose 5 mg 4 mg 4 mg 5 mg 4 mg 4 mg 4 mg    Description        4 mg daily except 5 mg on Wed and Sun.  F/U 2 weeks

## 2016-02-24 ENCOUNTER — Ambulatory Visit: Payer: Medicare Other | Admitting: Podiatry

## 2016-02-26 ENCOUNTER — Ambulatory Visit (INDEPENDENT_AMBULATORY_CARE_PROVIDER_SITE_OTHER): Payer: Medicare Other

## 2016-02-26 ENCOUNTER — Other Ambulatory Visit: Payer: Self-pay | Admitting: Family Medicine

## 2016-02-26 DIAGNOSIS — I4891 Unspecified atrial fibrillation: Secondary | ICD-10-CM | POA: Diagnosis not present

## 2016-02-27 ENCOUNTER — Other Ambulatory Visit: Payer: Self-pay | Admitting: Family Medicine

## 2016-02-27 LAB — POCT INR
INR: 2.1
PT: 25

## 2016-02-27 NOTE — Patient Instructions (Signed)
Anticoagulation Dose Instructions as of 02/26/2016      Dawn Burnett Tue Wed Thu Fri Sat   New Dose 5 mg 4 mg 4 mg 5 mg 4 mg 4 mg 4 mg    Description        4 mg daily except 5 mg on Wed and Sun.  F/U 2 weeks

## 2016-03-09 ENCOUNTER — Ambulatory Visit: Payer: Medicare Other | Admitting: Podiatry

## 2016-03-17 ENCOUNTER — Ambulatory Visit (INDEPENDENT_AMBULATORY_CARE_PROVIDER_SITE_OTHER): Payer: Medicare Other | Admitting: Family Medicine

## 2016-03-17 VITALS — BP 130/70 | HR 80 | Temp 98.0°F | Resp 14 | Wt 191.0 lb

## 2016-03-17 DIAGNOSIS — I4891 Unspecified atrial fibrillation: Secondary | ICD-10-CM

## 2016-03-17 DIAGNOSIS — E1121 Type 2 diabetes mellitus with diabetic nephropathy: Secondary | ICD-10-CM | POA: Diagnosis not present

## 2016-03-17 DIAGNOSIS — J441 Chronic obstructive pulmonary disease with (acute) exacerbation: Secondary | ICD-10-CM | POA: Diagnosis not present

## 2016-03-17 LAB — POCT INR
INR: 2.2
PT: 26.4

## 2016-03-17 MED ORDER — DOXYCYCLINE HYCLATE 100 MG PO TABS: 100.0000 mg | ORAL_TABLET | Freq: Two times a day (BID) | ORAL | 0 refills | Status: DC

## 2016-03-17 MED ORDER — PREDNISONE 5 MG (21) PO TBPK: 5.0000 mg | ORAL_TABLET | Freq: Every day | ORAL | 0 refills | Status: DC

## 2016-03-17 NOTE — Patient Instructions (Signed)
Anticoagulation Dose Instructions as of 03/17/2016      Dorene Grebe Tue Wed Thu Fri Sat   New Dose 5 mg 4 mg 4 mg 5 mg 4 mg 4 mg 4 mg    Description   4 mg daily except 5 mg on Wed and Sun.  F/U 4 weeks

## 2016-03-17 NOTE — Progress Notes (Signed)
Dawn Burnett  MRN: [id number] DOB: 1929/02/15  Subjective:  HPI   The patient is an 82 year old female who presents with cold symptoms.  She reports that 1 week ago she began having symptoms of cough, head congestion and sneezing.  She had a dental appointment that day and took 4 Amoxicillin for prophylactic treatment due to hip and knee replacements. She had more Amoxicillin and started taking them three times daily until she called Dr. Orson Slick office on Friday and got a full prescription.  She had been treated by him for a sinus infection less than a month ago.  When she called their office they had told her to see her primary care provider.  She states that she thinks she is getting bronchitis because of the cough and how she feels there is chest congestion but she is unable to cough any out.  She has run a slight fever and had a little sore throat.   The patient also asked that while she is here she would like to get her PT/INR done to save her a trip tomorrow.   She also has concerns about the top of her feet.  She states that her feet hurt all of the time and the top of he feet are often very red.  Patient reports that they are more red after she has taken a shower. "I don't want to lose my feet."  Patient Active Problem List   Diagnosis Date Noted  . 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 Allergic rhinitis 12/07/2014  . Arthritis 12/07/2014  . Atrial fibrillation, controlled ([place]) 12/07/2014  . Arteriosclerosis of coronary artery 12/07/2014  . Essential (primary) hypertension 12/07/2014  . Acid reflux 12/07/2014  . History of prolonged Q-T interval on ECG 12/07/2014  . Blood glucose elevated 12/07/2014  . Adult hypothyroidism 12/07/2014  . Urinary incontinence 12/07/2014  . Neuropathy ([place]) 12/07/2014  . Adiposity 12/07/2014  . Osteopenia 12/07/2014  . HLD (hyperlipidemia) 12/07/2014  . Alteration in bowel elimination: incontinence 12/07/2014  .  Hiatal hernia   . Atrial fibrillation ([place])   . Basal cell carcinoma of ear 11/14/2014  . Bladder infection, chronic 06/01/2012  . Urethral caruncle 06/01/2012  . SCC (squamous cell carcinoma), face 06/16/2011  . ABDOMINAL MASS 05/16/2009  . NONSPECIFIC ABN FINDING RAD & OTH EXAM GI TRACT 04/24/2009    Past Medical History:  Diagnosis Date  . Arthritis   . Atrial fibrillation ([place])   . CHF (congestive heart failure) ([place])   . GERD (gastroesophageal reflux disease)   . Hiatal hernia   . Hypertension   . Skin cancer, basal cell   . Thyroid disease     Social History   Social History  . Marital status: Widowed    Spouse name: widowed  . Number of children: 2  . Years of education: 12   Occupational History  . retired    Social History Main Topics  . Smoking status: Never Smoker  . Smokeless tobacco: Never Used  . Alcohol use No  . Drug use: No  . Sexual activity: No   Other Topics Concern  . Not on file   Social History Narrative  . No narrative on file    Outpatient Medications Prior to Visit  Medication Sig Dispense Refill  . acetaminophen (TYLENOL) 500 MG tablet Take 500 mg by mouth every 6 (six) hours as needed for mild pain, moderate pain, fever or headache.    Marland Kitchen atorvastatin (LIPITOR) 20  MG tablet Take 20 mg by mouth at bedtime.     . Calcium-Vitamin D 600-200 MG-UNIT per tablet Take 600 tablets by mouth daily with supper.     . celecoxib (CELEBREX) 200 MG capsule Take 1 capsule (200 mg total) by mouth daily. 30 capsule 12  . COUMADIN 4 MG tablet TAKE 1 TABLET AS DIRECTED 30 tablet 12  . desoximetasone (TOPICORT) 0.05 % cream Apply topically 2 (two) times daily. 30 g 3  . diltiazem (CARDIZEM CD) 120 MG 24 hr capsule Take 120 mg by mouth daily.    . fluticasone (FLONASE) 50 MCG/ACT nasal spray Place 2 sprays into both nostrils daily. 16 g 6  . furosemide (LASIX) 20 MG tablet Take 20 mg by mouth daily.     Marland Kitchen gabapentin (NEURONTIN) 600 MG tablet TAKE 1 TABLET  BY MOUTH 3 TIMES A DAY 90 tablet 11  . imipramine (TOFRANIL) 25 MG tablet Take 25 mg by mouth at bedtime.     Marland Kitchen levothyroxine (SYNTHROID, LEVOTHROID) 50 MCG tablet Take 1 tablet (50 mcg total) by mouth daily before breakfast. 30 tablet 2  . metoprolol (LOPRESSOR) 100 MG tablet Take 100 mg by mouth 2 (two) times daily.     . Multiple Vitamins-Minerals (CENTRUM SILVER ULTRA WOMENS PO) Take 1 tablet by mouth daily with lunch.     . nystatin cream (MYCOSTATIN) APPLY  TOPICALLY 2 (TWO) TIMES DAILY. APPLY TO AFFECTED AREA 30 g 0  . omeprazole (PRILOSEC) 20 MG capsule Take 20 mg by mouth 2 (two) times daily before a meal.     . potassium chloride (K-DUR) 10 MEQ tablet Take 10 mEq by mouth daily. Reported on 12/24/2015    . ranitidine (ZANTAC) 150 MG tablet TAKE 1 TABLET BY MOUTH TWO TIMES DAILY 60 tablet 12  . warfarin (COUMADIN) 5 MG tablet Take 1 tablet (5 mg total) by mouth daily. 30 tablet 3   No facility-administered medications prior to visit.     Allergies  Allergen Reactions  . Iodinated Diagnostic Agents Rash  . Augmentin [Amoxicillin-Pot Clavulanate] Other (See Comments)    Gi distress   . Codeine   . Crestor [Rosuvastatin] Other (See Comments)    Unknown Unknown  . Naproxen Other (See Comments)    unknown unknown  . Prednisone   . Sulfa Antibiotics   . Sulfacetamide Sodium   . Tolectin [Tolmetin]     Review of Systems  Constitutional: Positive for chills, fever and malaise/fatigue.  HENT: Positive for congestion, nosebleeds (slight) and sore throat. Negative for ear discharge, ear pain, hearing loss and tinnitus.   Eyes: Positive for redness (left eylid redness from where it droops). Negative for blurred vision, double vision, photophobia, pain and discharge.  Respiratory: Positive for cough and shortness of breath (a little harder to breath). Negative for sputum production and wheezing.   Cardiovascular: Positive for palpitations (chronic) and leg swelling. Negative for  chest pain, orthopnea and claudication.  Gastrointestinal: Negative.   Genitourinary: Negative.   Musculoskeletal: Positive for joint pain.  Neurological: Positive for weakness. Negative for headaches.  Endo/Heme/Allergies: Negative.   Psychiatric/Behavioral: Negative.    Objective:  BP 130/70   Pulse 80   Temp 98 F (36.7 C) (Oral)   Resp 14   Wt 191 lb (86.6 kg)   SpO2 94%   BMI 30.37 kg/m   Physical Exam  Constitutional: She is oriented to person, place, and time and well-developed, well-nourished, and in no distress.  HENT:  Head: Normocephalic and atraumatic.  Right Ear: External ear normal.  Left Ear: External ear normal.  Nose: Nose normal.  Mouth/Throat: Oropharynx is clear and moist. No oropharyngeal exudate.  Erythematous posterior pharynx without exudates. No tonsillar enlargement.She  is tender over maxillary sinuses.  Cardiovascular: Normal rate, regular rhythm and normal heart sounds.   Pulmonary/Chest: Effort normal and breath sounds normal. No respiratory distress. She has no wheezes. She has no rales.  Abdominal: Soft.  Musculoskeletal: She exhibits edema. She exhibits no tenderness.  She has trace edema with support hose on. Walks with some difficulty with a walker due to arthritic pain  Neurological: She is alert and oriented to person, place, and time.  Skin: Skin is warm and dry.  She has varicose veins of the lower extremities or light spider varicosities. She also has venous stasis changes and one third of both and ankles with out changes that suggest any type of infection. Venous stasis changes are normal on his colors with purple and red anteriorly. There is no induration of the tissue and no other symptoms or signs of infection  Psychiatric: Memory, affect and judgment normal.    Assessment and Plan :  1. COPD exacerbation ([place]) That is 94% - doxycycline (VIBRA-TABS) 100 MG tablet; Take 1 tablet (100 mg total) by mouth 2 (two) times daily.   Dispense: 14 tablet; Refill: 0 - predniSONE (STERAPRED UNI-PAK 21 TAB) 5 MG (21) TBPK tablet; Take 1 tablet (5 mg total) by mouth daily. Take dose pack as directed.  Dispense: 21 tablet; Refill: 0  2. Atrial fibrillation, controlled ([place]) Recheck  PT in one week with adding doxycycline as this will definitely change. Not a great drug with Coumadin but I think it is the best option to try to medically treat the patient. Amoxicillin for a week and has failed. - POCT INR 3. Cough No chest x-ray this time as she is not in distress today 4. Maxillary sinusitis 5. Severe osteoarthritis 6. Venous stasis skin changes with varicose veins 7. Type 2 diabetes Patient will check sugar daily in the face of the infection and steroid use. She knows it will go up some. Miguel Aschoff MD [facility] 03/17/2016 4:18 PM

## 2016-03-18 ENCOUNTER — Ambulatory Visit: Payer: Self-pay

## 2016-03-18 ENCOUNTER — Ambulatory Visit: Payer: Self-pay | Admitting: Family Medicine

## 2016-03-23 ENCOUNTER — Other Ambulatory Visit: Payer: Self-pay | Admitting: Family Medicine

## 2016-03-25 ENCOUNTER — Encounter: Payer: Self-pay | Admitting: Podiatry

## 2016-03-25 ENCOUNTER — Ambulatory Visit (INDEPENDENT_AMBULATORY_CARE_PROVIDER_SITE_OTHER): Payer: Medicare Other | Admitting: Podiatry

## 2016-03-25 ENCOUNTER — Ambulatory Visit (INDEPENDENT_AMBULATORY_CARE_PROVIDER_SITE_OTHER): Payer: Medicare Other

## 2016-03-25 DIAGNOSIS — B351 Tinea unguium: Secondary | ICD-10-CM | POA: Diagnosis not present

## 2016-03-25 DIAGNOSIS — M79676 Pain in unspecified toe(s): Secondary | ICD-10-CM | POA: Diagnosis not present

## 2016-03-25 DIAGNOSIS — I4891 Unspecified atrial fibrillation: Secondary | ICD-10-CM

## 2016-03-25 LAB — POCT INR
INR: 2.4
PT: 28.3

## 2016-03-25 NOTE — Patient Instructions (Signed)
Anticoagulation Dose Instructions as of 03/25/2016      Dawn Burnett Tue Wed Thu Fri Sat   New Dose 5 mg 4 mg 4 mg 5 mg 4 mg 4 mg 4 mg    Description   4 mg daily except 5 mg on Wed and Sun.  F/U 4 weeks

## 2016-03-25 NOTE — Progress Notes (Signed)
She presents today chief complaint of painful elongated toenails.  Objective: Toenails are thick yellow dystrophic with mycotic pulses remain palpable.  Assessment: Pain elicited onychomycosis.  Plan: Debridement of toenails 1 through 5 bilateral. Follow-up with her in 3 months

## 2016-04-10 ENCOUNTER — Encounter: Payer: Self-pay | Admitting: Family Medicine

## 2016-04-15 ENCOUNTER — Ambulatory Visit (INDEPENDENT_AMBULATORY_CARE_PROVIDER_SITE_OTHER): Payer: Medicare Other

## 2016-04-15 ENCOUNTER — Ambulatory Visit: Payer: Self-pay

## 2016-04-15 DIAGNOSIS — I4891 Unspecified atrial fibrillation: Secondary | ICD-10-CM

## 2016-04-15 LAB — POCT INR
INR: 2
PT: 23.6

## 2016-04-15 NOTE — Patient Instructions (Signed)
Anticoagulation Dose Instructions as of 04/15/2016      Dawn Burnett Tue Wed Thu Fri Sat   New Dose 5 mg 4 mg 4 mg 5 mg 4 mg 4 mg 4 mg    Description   4 mg daily except 5 mg on Wed and Sun.  F/U 4 weeks

## 2016-04-21 ENCOUNTER — Ambulatory Visit: Payer: Medicare Other | Admitting: Family Medicine

## 2016-04-28 ENCOUNTER — Ambulatory Visit (INDEPENDENT_AMBULATORY_CARE_PROVIDER_SITE_OTHER): Payer: Medicare Other | Admitting: Family Medicine

## 2016-04-28 VITALS — BP 108/54 | HR 80 | Temp 98.1°F | Resp 14 | Wt 190.0 lb

## 2016-04-28 DIAGNOSIS — I10 Essential (primary) hypertension: Secondary | ICD-10-CM

## 2016-04-28 DIAGNOSIS — I4891 Unspecified atrial fibrillation: Secondary | ICD-10-CM

## 2016-04-28 DIAGNOSIS — Z23 Encounter for immunization: Secondary | ICD-10-CM | POA: Diagnosis not present

## 2016-04-28 DIAGNOSIS — M158 Other polyosteoarthritis: Secondary | ICD-10-CM | POA: Diagnosis not present

## 2016-04-28 DIAGNOSIS — E039 Hypothyroidism, unspecified: Secondary | ICD-10-CM | POA: Diagnosis not present

## 2016-04-28 DIAGNOSIS — E1121 Type 2 diabetes mellitus with diabetic nephropathy: Secondary | ICD-10-CM | POA: Diagnosis not present

## 2016-04-28 LAB — POCT GLYCOSYLATED HEMOGLOBIN (HGB A1C): Hemoglobin A1C: 7.3

## 2016-04-28 NOTE — Progress Notes (Signed)
Dawn Burnett  MRN: [id number] DOB: 29-Sep-1928  Subjective:  HPI  Patient is here for 4 months follow up. Overall she feels pretty well. Everything is stable and she has not fallen recently. She was doing well with her medications including her anticoagulant. Diabetes: patient does not check her sugar at home. Lab Results  Component Value Date   HGBA1C 8.1 (H) 12/25/2015   Hyperlipidemia: Lab Results  Component Value Date   CHOL 184 12/25/2015   HDL 44 12/25/2015   LDLCALC 70 12/25/2015   TRIG 350 (H) 12/25/2015   Hypothyroidism: Lab Results  Component Value Date   TSH 1.630 12/25/2015   Since last visit Dr Humphrey Rolls switched patient from [place] to [place] and she is following up with him next week for this. So far no side effects noted from the change.  Patient Active Problem List   Diagnosis Date Noted  . 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 Allergic rhinitis 12/07/2014  . Arthritis 12/07/2014  . Atrial fibrillation, controlled ([place]) 12/07/2014  . Arteriosclerosis of coronary artery 12/07/2014  . Essential (primary) hypertension 12/07/2014  . Acid reflux 12/07/2014  . History of prolonged Q-T interval on ECG 12/07/2014  . Blood glucose elevated 12/07/2014  . Adult hypothyroidism 12/07/2014  . Urinary incontinence 12/07/2014  . Neuropathy ([place]) 12/07/2014  . Adiposity 12/07/2014  . Osteopenia 12/07/2014  . HLD (hyperlipidemia) 12/07/2014  . Alteration in bowel elimination: incontinence 12/07/2014  . Hiatal hernia   . Atrial fibrillation ([place])   . Basal cell carcinoma of ear 11/14/2014  . Bladder infection, chronic 06/01/2012  . Urethral caruncle 06/01/2012  . SCC (squamous cell carcinoma), face 06/16/2011  . ABDOMINAL MASS 05/16/2009  . NONSPECIFIC ABN FINDING RAD & OTH EXAM GI TRACT 04/24/2009    Past Medical History:  Diagnosis Date  . Arthritis   . Atrial fibrillation ([place])   . CHF (congestive heart failure) ([place])   . GERD  (gastroesophageal reflux disease)   . Hiatal hernia   . Hypertension   . Skin cancer, basal cell   . Thyroid disease     Social History   Social History  . Marital status: Widowed    Spouse name: widowed  . Number of children: 2  . Years of education: 12   Occupational History  . retired    Social History Main Topics  . Smoking status: Never Smoker  . Smokeless tobacco: Never Used  . Alcohol use No  . Drug use: No  . Sexual activity: No   Other Topics Concern  . Not on file   Social History Narrative  . No narrative on file    Outpatient Encounter Prescriptions as of 04/28/2016  Medication Sig Note  . acetaminophen (TYLENOL) 500 MG tablet Take 500 mg by mouth every 6 (six) hours as needed for mild pain, moderate pain, fever or headache.   Marland Kitchen atorvastatin (LIPITOR) 20 MG tablet Take 20 mg by mouth at bedtime.    . Calcium-Vitamin D 600-200 MG-UNIT per tablet Take 600 tablets by mouth daily with supper.    . celecoxib (CELEBREX) 200 MG capsule Take 1 capsule (200 mg total) by mouth daily.   Marland Kitchen COUMADIN 1 MG tablet TAKE 1 (ONE) TABLET TABLET, ORAL, DAILY AS DIRECTED   . COUMADIN 4 MG tablet TAKE 1 TABLET AS DIRECTED   . desoximetasone (TOPICORT) 0.05 % cream Apply topically 2 (two) times daily.   Marland Kitchen diltiazem (CARDIZEM CD) 120 MG 24 hr capsule Take 120 mg  by mouth daily.   Marland Kitchen ENTRESTO 24-26 MG Take 1 tablet by mouth daily. 04/28/2016: Received from: External Pharmacy  . fluticasone (FLONASE) 50 MCG/ACT nasal spray Place 2 sprays into both nostrils daily.   . furosemide (LASIX) 20 MG tablet Take 20 mg by mouth daily.    Marland Kitchen gabapentin (NEURONTIN) 600 MG tablet TAKE 1 TABLET BY MOUTH 3 TIMES A DAY   . imipramine (TOFRANIL) 25 MG tablet Take 25 mg by mouth at bedtime.    Marland Kitchen levothyroxine (SYNTHROID, LEVOTHROID) 50 MCG tablet Take 1 tablet (50 mcg total) by mouth daily before breakfast.   . metoprolol (LOPRESSOR) 100 MG tablet Take 100 mg by mouth 2 (two) times daily.    . Multiple  Vitamins-Minerals (CENTRUM SILVER ULTRA WOMENS PO) Take 1 tablet by mouth daily with lunch.    . nystatin cream (MYCOSTATIN) APPLY  TOPICALLY 2 (TWO) TIMES DAILY. APPLY TO AFFECTED AREA   . omeprazole (PRILOSEC) 20 MG capsule Take 20 mg by mouth 2 (two) times daily before a meal.    . potassium chloride (K-DUR) 10 MEQ tablet Take 10 mEq by mouth daily. Reported on 12/24/2015   . ranitidine (ZANTAC) 150 MG tablet TAKE 1 TABLET BY MOUTH TWO TIMES DAILY   . warfarin (COUMADIN) 5 MG tablet Take 1 tablet (5 mg total) by mouth daily.   . [DISCONTINUED] doxycycline (VIBRA-TABS) 100 MG tablet Take 1 tablet (100 mg total) by mouth 2 (two) times daily.   . [DISCONTINUED] predniSONE (STERAPRED UNI-PAK 21 TAB) 5 MG (21) TBPK tablet Take 1 tablet (5 mg total) by mouth daily. Take dose pack as directed.    No facility-administered encounter medications on file as of 04/28/2016.     Allergies  Allergen Reactions  . Iodinated Diagnostic Agents Rash  . Augmentin [Amoxicillin-Pot Clavulanate] Other (See Comments)    Gi distress   . Codeine   . Crestor [Rosuvastatin] Other (See Comments)    Unknown Unknown  . Naproxen Other (See Comments)    unknown unknown  . Prednisone   . Sulfa Antibiotics   . Sulfacetamide Sodium   . Tolectin [Tolmetin]     Review of Systems  Constitutional: Negative for chills, fever and weight loss.  HENT: Positive for hearing loss (chronic worsening).   Eyes: Negative.   Respiratory: Negative.   Cardiovascular: Negative.   Gastrointestinal: Positive for diarrhea (off and on). Constipation: off and on.  Musculoskeletal: Positive for joint pain and myalgias.       Stiffness  Neurological: Negative for dizziness.  Endo/Heme/Allergies: Bruises/bleeds easily.  Psychiatric/Behavioral: Negative.    Objective:  BP (!) 108/54   Pulse 80   Temp 98.1 F (36.7 C)   Resp 14   Wt 190 lb (86.2 kg)   BMI 30.21 kg/m   Physical Exam  Constitutional: She is oriented to person,  place, and time and well-developed, well-nourished, and in no distress.  HENT:  Head: Normocephalic and atraumatic.  Right Ear: External ear normal.  Left Ear: External ear normal.  Eyes: Conjunctivae are normal. Pupils are equal, round, and reactive to light.  Neck: Normal range of motion. Neck supple.  Cardiovascular: Normal rate, regular rhythm, normal heart sounds and intact distal pulses.   No murmur heard. Pulmonary/Chest: Effort normal and breath sounds normal. No respiratory distress. She has no wheezes.  Neurological: She is alert and oriented to person, place, and time. Gait (due to joint pain and stiffness) abnormal.  Skin: Skin is dry.  Psychiatric: Mood, memory, affect and  judgment normal.    Assessment and Plan :  1. Type 2 diabetes mellitus with diabetic nephropathy, without long-term current use of insulin (HCC) A1C 7.3 better. Continue following. - POCT HgB A1C--7.3 More than 50% of visit is spent in counseling regarding these issues. 2. Atrial fibrillation, controlled ([place]) Stable.  3. Hypothyroidism, unspecified hypothyroidism type  4. Essential (primary) hypertension stable  5. Need for influenza vaccination - Flu vaccine HIGH DOSE PF (Fluzone High dose) Patient states she had Pneumovax at some point. Too early to update this today due to Prevnar was given last November 2016. 6. Other osteoarthritis involving multiple joints  HPI, Exam and A&P transcribed under direction and in the presence of Miguel Aschoff, MD. I have done the exam and reviewed the chart and it is accurate to the best of my knowledge. Miguel Aschoff M.D. [facility]

## 2016-04-28 NOTE — Patient Instructions (Signed)
Try Metamucil daily for bowel issues.

## 2016-05-13 ENCOUNTER — Ambulatory Visit (INDEPENDENT_AMBULATORY_CARE_PROVIDER_SITE_OTHER): Payer: Medicare Other

## 2016-05-13 DIAGNOSIS — I4891 Unspecified atrial fibrillation: Secondary | ICD-10-CM | POA: Diagnosis not present

## 2016-05-13 LAB — POCT INR
INR: 2.4
PT: 29.2

## 2016-05-13 NOTE — Patient Instructions (Signed)
4mg  daily except 5mg  on Wed and Sun.  F/U 4 weeks.

## 2016-05-25 ENCOUNTER — Other Ambulatory Visit: Payer: Self-pay

## 2016-05-26 MED ORDER — GABAPENTIN 600 MG PO TABS: 600.0000 mg | ORAL_TABLET | Freq: Three times a day (TID) | ORAL | 11 refills | Status: DC

## 2016-06-10 ENCOUNTER — Ambulatory Visit (INDEPENDENT_AMBULATORY_CARE_PROVIDER_SITE_OTHER): Payer: Medicare Other

## 2016-06-10 DIAGNOSIS — I4891 Unspecified atrial fibrillation: Secondary | ICD-10-CM

## 2016-06-10 LAB — POCT INR
INR: 2.1
PT: 25.3

## 2016-06-10 NOTE — Patient Instructions (Signed)
Anticoagulation Dose Instructions as of 06/10/2016      Dawn Burnett Tue Wed Thu Fri Sat   New Dose 5 mg 4 mg 4 mg 5 mg 4 mg 4 mg 4 mg    Description   4 mg daily except 5 mg on Wed and Sun.  F/U 4 weeks

## 2016-06-22 ENCOUNTER — Telehealth: Payer: Self-pay

## 2016-06-22 ENCOUNTER — Other Ambulatory Visit: Payer: Self-pay | Admitting: Family Medicine

## 2016-06-22 NOTE — Telephone Encounter (Signed)
Called office to confirm last HgbA1C, gave verbal over phone. KW

## 2016-06-29 ENCOUNTER — Ambulatory Visit: Payer: Medicare Other | Admitting: Podiatry

## 2016-07-06 ENCOUNTER — Ambulatory Visit (INDEPENDENT_AMBULATORY_CARE_PROVIDER_SITE_OTHER): Payer: Medicare Other | Admitting: Podiatry

## 2016-07-06 ENCOUNTER — Encounter: Payer: Self-pay | Admitting: Podiatry

## 2016-07-06 VITALS — Resp 14 | Ht 66.5 in | Wt 190.0 lb

## 2016-07-06 DIAGNOSIS — B351 Tinea unguium: Secondary | ICD-10-CM

## 2016-07-06 DIAGNOSIS — M79676 Pain in unspecified toe(s): Secondary | ICD-10-CM

## 2016-07-06 NOTE — Progress Notes (Signed)
Complaint:  Visit Type: Patient returns to my office for continued preventative foot care services. Complaint: Patient states" my nails have grown long and thick and become painful to walk and wear shoes" Patient has been diagnosed with hyperglycemia with neuropathy.. The patient presents for preventative foot care services. No changes to ROS  Podiatric Exam: Vascular: dorsalis pedis and posterior tibial pulses are palpable bilateral. Capillary return is immediate. Temperature gradient is WNL. Skin turgor WNL  Dorsal skin lesions due to vascular. Sensorium: Normal Semmes Weinstein monofilament test. Normal tactile sensation bilaterally. Nail Exam: Pt has thick disfigured discolored nails with subungual debris noted bilateral entire nail hallux through fifth toenails Ulcer Exam: There is no evidence of ulcer or pre-ulcerative changes or infection. Orthopedic Exam: Muscle tone and strength are WNL. No limitations in general ROM. No crepitus or effusions noted. Foot type and digits show no abnormalities. Bony prominences are unremarkable. Skin: No Porokeratosis. No infection or ulcers  Diagnosis:  Onychomycosis, , Pain in right toe, pain in left toes  Treatment & Plan Procedures and Treatment: Consent by patient was obtained for treatment procedures. The patient understood the discussion of treatment and procedures well. All questions were answered thoroughly reviewed. Debridement of mycotic and hypertrophic toenails, 1 through 5 bilateral and clearing of subungual debris. No ulceration, no infection noted.  Return Visit-Office Procedure: Patient instructed to return to the office for a follow up visit 3 months for continued evaluation and treatment.    Gardiner Barefoot DPM

## 2016-07-08 ENCOUNTER — Ambulatory Visit (INDEPENDENT_AMBULATORY_CARE_PROVIDER_SITE_OTHER): Payer: Medicare Other

## 2016-07-08 DIAGNOSIS — I4891 Unspecified atrial fibrillation: Secondary | ICD-10-CM

## 2016-07-08 LAB — POCT INR
INR: 2.7
PT: 32.8

## 2016-07-08 NOTE — Patient Instructions (Signed)
Anticoagulation Dose Instructions as of 07/08/2016      Dorene Grebe Tue Wed Thu Fri Sat   New Dose 5 mg 4 mg 4 mg 5 mg 4 mg 4 mg 4 mg    Description   4 mg daily except 5 mg on Wed and Sun.  F/U 4 weeks

## 2016-07-20 IMAGING — CR DG CHEST 1V PORT
1 series · 1 of 1 positions shown · non-contrast
Comparison: April 18, 2014

CLINICAL DATA: Shortness of breath for 3 days. History of atrial
fibrillation.

EXAM:
PORTABLE CHEST 1 VIEW

[ap]
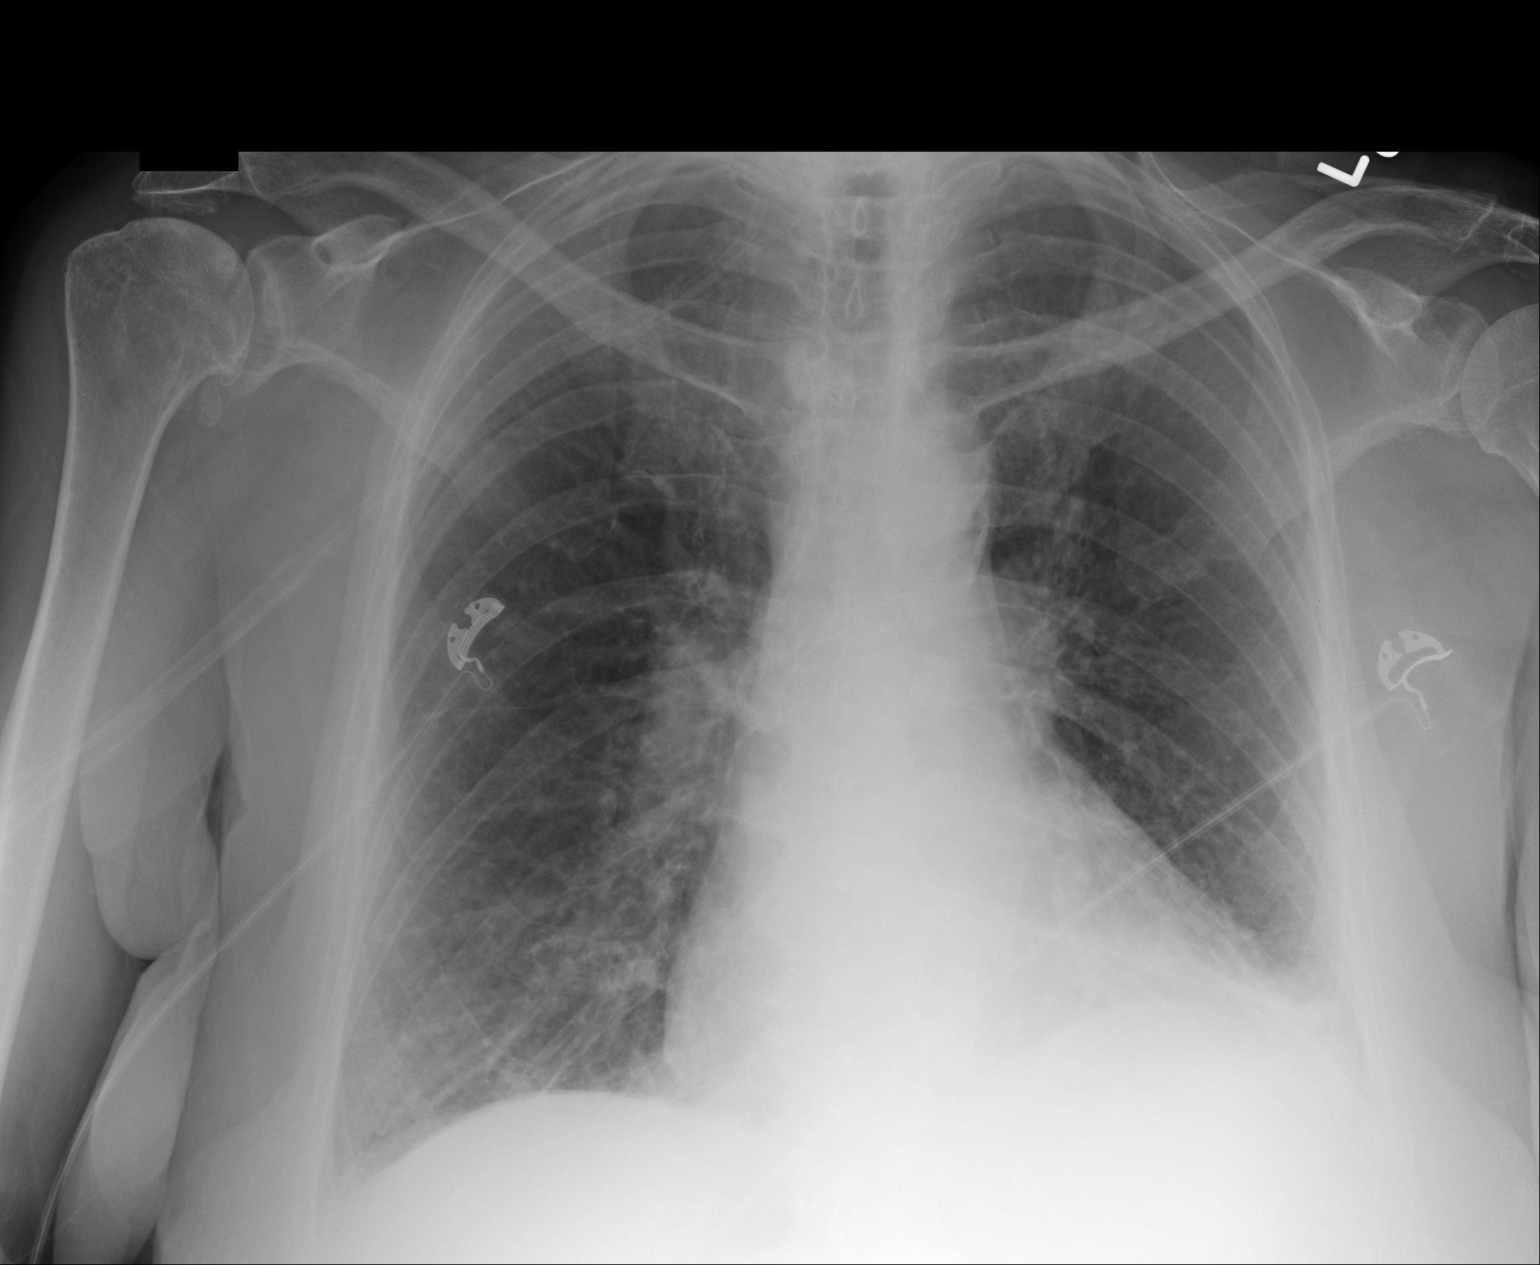

[1 of 1 positions shown; findings below may reference images not displayed]

FINDINGS: There is stable scarring in the left base. There is no edema or
consolidation. Heart size and pulmonary vascularity are within
normal limits. No adenopathy. No bone lesions. There is
atherosclerotic calcification in the aorta. There is arthropathy in
the right shoulder.
IMPRESSION: Scarring left base.  No edema or consolidation.

## 2016-07-30 ENCOUNTER — Ambulatory Visit: Payer: Medicare Other

## 2016-07-30 ENCOUNTER — Other Ambulatory Visit: Payer: Medicare Other

## 2016-07-31 ENCOUNTER — Ambulatory Visit: Payer: Medicare Other

## 2016-07-31 ENCOUNTER — Inpatient Hospital Stay: Payer: Medicare Other | Attending: Oncology

## 2016-07-31 ENCOUNTER — Inpatient Hospital Stay (HOSPITAL_BASED_OUTPATIENT_CLINIC_OR_DEPARTMENT_OTHER): Payer: Medicare Other | Admitting: Oncology

## 2016-07-31 ENCOUNTER — Other Ambulatory Visit: Payer: Self-pay | Admitting: *Deleted

## 2016-07-31 ENCOUNTER — Encounter: Payer: Self-pay | Admitting: Oncology

## 2016-07-31 VITALS — BP 126/70 | HR 68 | Temp 97.9°F | Resp 18 | Wt 187.7 lb

## 2016-07-31 DIAGNOSIS — Z7901 Long term (current) use of anticoagulants: Secondary | ICD-10-CM | POA: Diagnosis not present

## 2016-07-31 DIAGNOSIS — K449 Diaphragmatic hernia without obstruction or gangrene: Secondary | ICD-10-CM | POA: Diagnosis not present

## 2016-07-31 DIAGNOSIS — I10 Essential (primary) hypertension: Secondary | ICD-10-CM | POA: Insufficient documentation

## 2016-07-31 DIAGNOSIS — Z79899 Other long term (current) drug therapy: Secondary | ICD-10-CM | POA: Diagnosis not present

## 2016-07-31 DIAGNOSIS — E079 Disorder of thyroid, unspecified: Secondary | ICD-10-CM | POA: Insufficient documentation

## 2016-07-31 DIAGNOSIS — C49A2 Gastrointestinal stromal tumor of stomach: Secondary | ICD-10-CM

## 2016-07-31 DIAGNOSIS — I4891 Unspecified atrial fibrillation: Secondary | ICD-10-CM | POA: Diagnosis not present

## 2016-07-31 DIAGNOSIS — K219 Gastro-esophageal reflux disease without esophagitis: Secondary | ICD-10-CM | POA: Diagnosis not present

## 2016-07-31 DIAGNOSIS — M1990 Unspecified osteoarthritis, unspecified site: Secondary | ICD-10-CM | POA: Insufficient documentation

## 2016-07-31 DIAGNOSIS — Z96651 Presence of right artificial knee joint: Secondary | ICD-10-CM | POA: Insufficient documentation

## 2016-07-31 DIAGNOSIS — I509 Heart failure, unspecified: Secondary | ICD-10-CM | POA: Diagnosis not present

## 2016-07-31 LAB — CBC WITH DIFFERENTIAL/PLATELET
Basophils Absolute: 0 10*3/uL (ref 0–0.1)
Basophils Relative: 1 %
Eosinophils Absolute: 0.1 10*3/uL (ref 0–0.7)
Eosinophils Relative: 1 %
HCT: 39.8 % (ref 35.0–47.0)
Hemoglobin: 13.6 g/dL (ref 12.0–16.0)
Lymphocytes Relative: 30 %
Lymphs Abs: 1.7 10*3/uL (ref 1.0–3.6)
MCH: 33.1 pg (ref 26.0–34.0)
MCHC: 34.2 g/dL (ref 32.0–36.0)
MCV: 96.9 fL (ref 80.0–100.0)
Monocytes Absolute: 0.5 10*3/uL (ref 0.2–0.9)
Monocytes Relative: 8 %
Neutro Abs: 3.3 10*3/uL (ref 1.4–6.5)
Neutrophils Relative %: 60 %
Platelets: 128 10*3/uL — ABNORMAL LOW (ref 150–440)
RBC: 4.11 MIL/uL (ref 3.80–5.20)
RDW: 13.6 % (ref 11.5–14.5)
WBC: 5.5 10*3/uL (ref 3.6–11.0)

## 2016-07-31 LAB — COMPREHENSIVE METABOLIC PANEL
ALT: 13 U/L — ABNORMAL LOW (ref 14–54)
AST: 21 U/L (ref 15–41)
Albumin: 4 g/dL (ref 3.5–5.0)
Alkaline Phosphatase: 48 U/L (ref 38–126)
Anion gap: 8 (ref 5–15)
BUN: 25 mg/dL — ABNORMAL HIGH (ref 6–20)
CO2: 28 mmol/L (ref 22–32)
Calcium: 9.1 mg/dL (ref 8.9–10.3)
Chloride: 103 mmol/L (ref 101–111)
Creatinine, Ser: 1.04 mg/dL — ABNORMAL HIGH (ref 0.44–1.00)
GFR calc Af Amer: 55 mL/min — ABNORMAL LOW (ref >60)
GFR calc non Af Amer: 48 mL/min — ABNORMAL LOW (ref >60)
Glucose, Bld: 203 mg/dL — ABNORMAL HIGH (ref 65–99)
Potassium: 4 mmol/L (ref 3.5–5.1)
Sodium: 139 mmol/L (ref 135–145)
Total Bilirubin: 0.9 mg/dL (ref 0.3–1.2)
Total Protein: 6.7 g/dL (ref 6.5–8.1)

## 2016-07-31 NOTE — Progress Notes (Signed)
Hematology/Oncology Consult note [facility]  Telephone:([phone] [phone]  Patient Care Team: Jerrol Banana., MD as PCP - General (Family Medicine) Margarita Rana, MD as Referring Physician (Family Medicine) Christene Lye, MD (General Surgery)   Name of the patient: Dawn Burnett  [id number]  01-27-1929   Date of visit: 07/31/16  Diagnosis- Gist tumor status post resection  Chief complaint/ Reason for visit-  routine follow-up of GIST tumor    Heme/Onc history:  1. Gastrointestinal stromal tumor, diagnoses by endoscopy.   2. Ultrasound biopsy on May 29, 2009. 3. Resection of the tumor in November, 2010.      Pathology showed 8 cm just tumor involving posterior wall of the stomach. Margins were negative and one lymph node was negative for malignancy. Mitotic rate was 1 per 5 mm. Spindle type low-grade gist During her last visit in June 2017 patient decided to no longer get surveillance CT scans done due to her age and follow up with [place] on a yearly basis to monitor clinical signs and symptoms. CT scan in June 2017 showed stable rounded soft tissue mass 41 x 28 mm in size in the gastrosplenic ligament. No evidence of new peritoneal and mesenteric or retroperitoneal lesion.   Interval history- patient is here today for a routine yearly follow-up. She reports occasional intermittent right lower quadrant abdominal pain which is self-limited. Appetite is good and denies any unintentional weight loss. Denies in any bleeding in her stools  ECOG PS- 2 Pain scale-4- baseline pain from arthritis Opioid associated constipation- no  Review of systems- Review of Systems  Constitutional: Negative for chills, fever, malaise/fatigue and weight loss.  HENT: Negative for congestion, ear discharge and nosebleeds.   Eyes: Negative for blurred vision.  Respiratory: Negative for cough, hemoptysis, sputum production, shortness of breath and  wheezing.   Cardiovascular: Negative for chest pain, palpitations, orthopnea and claudication.  Gastrointestinal: Negative for abdominal pain, blood in stool, constipation, diarrhea, heartburn, melena, nausea and vomiting.  Genitourinary: Negative for dysuria, flank pain, frequency, hematuria and urgency.  Musculoskeletal: Positive for joint pain (Chronic knee pain and hip pain). Negative for back pain and myalgias.  Skin: Negative for rash.  Neurological: Negative for dizziness, tingling, focal weakness, seizures, weakness and headaches.  Endo/Heme/Allergies: Does not bruise/bleed easily.  Psychiatric/Behavioral: Negative for depression and suicidal ideas. The patient does not have insomnia.      Current treatment- Observation Allergies  Allergen Reactions  . Iodinated Diagnostic Agents Rash  . Augmentin [Amoxicillin-Pot Clavulanate] Other (See Comments)    Gi distress   . Codeine   . Crestor [Rosuvastatin] Other (See Comments)    Unknown Unknown  . Naproxen Other (See Comments)    unknown unknown  . Prednisone   . Sulfa Antibiotics   . Sulfacetamide Sodium   . Tolectin [Tolmetin]      Past Medical History:  Diagnosis Date  . Arthritis   . Atrial fibrillation ([place])   . CHF (congestive heart failure) ([place])   . GERD (gastroesophageal reflux disease)   . Hiatal hernia   . Hypertension   . Skin cancer, basal cell   . Thyroid disease      Past Surgical History:  Procedure Laterality Date  . ABDOMINAL HYSTERECTOMY    . APPENDECTOMY    . BREAST SURGERY    . cateract extraction    . KNEE SURGERY    . 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  right hip surgery    . right total knee replacement      Social History   Social History  . Marital status: Widowed    Spouse name: widowed  . Number of children: 2  . Years of education: 12   Occupational History  . retired    Social History Main Topics  . Smoking status: Never Smoker  .  Smokeless tobacco: Never Used  . Alcohol use No  . Drug use: No  . Sexual activity: No   Other Topics Concern  . Not on file   Social History Narrative  . No narrative on file    Family History  Problem Relation Age of Onset  . Heart disease Mother   . Hypertension Mother   . Heart attack Mother   . Stomach cancer Father   . Liver cancer Brother   . Hypertension Son   . Huntington's disease Sister   . Diabetes Sister   . Kidney disease Sister   . Congestive Heart Failure Sister   . Pneumonia Sister   . Stroke Brother   . Parkinson's disease Brother   . Hypertension Brother   . Congestive Heart Failure Brother      Current Outpatient Prescriptions:  .  acetaminophen (TYLENOL) 500 MG tablet, Take 500 mg by mouth every 6 (six) hours as needed for mild pain, moderate pain, fever or headache., Disp: , Rfl:  .  atorvastatin (LIPITOR) 20 MG tablet, Take 20 mg by mouth at bedtime. , Disp: , Rfl:  .  Calcium-Vitamin D 600-200 MG-UNIT per tablet, Take 600 tablets by mouth daily with supper. , Disp: , Rfl:  .  celecoxib (CELEBREX) 200 MG capsule, Take 1 capsule (200 mg total) by mouth daily., Disp: 30 capsule, Rfl: 12 .  COUMADIN 1 MG tablet, TAKE 1 (ONE) TABLET TABLET, ORAL, DAILY AS DIRECTED, Disp: 30 tablet, Rfl: 11 .  COUMADIN 4 MG tablet, TAKE 1 TABLET AS DIRECTED, Disp: 30 tablet, Rfl: 12 .  desoximetasone (TOPICORT) 0.05 % cream, Apply topically 2 (two) times daily., Disp: 30 g, Rfl: 3 .  diltiazem (CARDIZEM CD) 120 MG 24 hr capsule, Take 120 mg by mouth daily., Disp: , Rfl:  .  ENTRESTO 24-26 MG, Take 1 tablet by mouth daily., Disp: , Rfl:  .  fluticasone (FLONASE) 50 MCG/ACT nasal spray, Place 2 sprays into both nostrils daily., Disp: 16 g, Rfl: 6 .  furosemide (LASIX) 20 MG tablet, Take 20 mg by mouth daily. , Disp: , Rfl:  .  gabapentin (NEURONTIN) 600 MG tablet, Take 1 tablet (600 mg total) by mouth 3 (three) times daily., Disp: 90 tablet, Rfl: 11 .  imipramine  (TOFRANIL) 25 MG tablet, Take 25 mg by mouth at bedtime. , Disp: , Rfl:  .  levothyroxine (SYNTHROID, LEVOTHROID) 50 MCG tablet, Take 1 tablet (50 mcg total) by mouth daily before breakfast., Disp: 30 tablet, Rfl: 2 .  metoprolol (LOPRESSOR) 100 MG tablet, Take 100 mg by mouth 2 (two) times daily. , Disp: , Rfl:  .  Multiple Vitamins-Minerals (CENTRUM SILVER ULTRA WOMENS PO), Take 1 tablet by mouth daily with lunch. , Disp: , Rfl:  .  nystatin cream (MYCOSTATIN), APPLY  TOPICALLY 2 (TWO) TIMES DAILY. APPLY TO AFFECTED AREA, Disp: 30 g, Rfl: 0 .  omeprazole (PRILOSEC) 20 MG capsule, TAKE ONE CAPSULE DAILY, Disp: 30 capsule, Rfl: 11 .  potassium chloride (K-DUR) 10 MEQ tablet, Take 10 mEq by mouth daily. Reported on 12/24/2015, Disp: , Rfl:  .  ranitidine (ZANTAC) 150 MG tablet, TAKE 1 TABLET BY MOUTH TWO TIMES DAILY, Disp: 60 tablet, Rfl: 12 .  warfarin (COUMADIN) 5 MG tablet, Take 1 tablet (5 mg total) by mouth daily., Disp: 30 tablet, Rfl: 3  Physical exam:  Vitals:   07/31/16 1010  BP: 126/70  Pulse: 68  Resp: 18  Temp: 97.9 F (36.6 C)  TempSrc: Tympanic  Weight: 187 lb 11.6 oz (85.2 kg)   Physical Exam  Constitutional: She is oriented to person, place, and time and well-developed, well-nourished, and in no distress.  She ambulates with the help of walker and gait is slow  HENT:  Head: Normocephalic and atraumatic.  Eyes: EOM are normal. Pupils are equal, round, and reactive to light.  Neck: Normal range of motion.  Cardiovascular: Normal rate, regular rhythm and normal heart sounds.   Pulmonary/Chest: Effort normal and breath sounds normal.  Abdominal: Soft. Bowel sounds are normal.  Neurological: She is alert and oriented to person, place, and time.  Skin: Skin is warm and dry.     CMP Latest Ref Rng & Units 01/29/2016  Glucose 65 - 99 mg/dL 171(H)  BUN 6 - 20 mg/dL 27(H)  Creatinine 0.44 - 1.00 mg/dL 1.00  Sodium 135 - 145 mmol/L 139  Potassium 3.5 - 5.1 mmol/L 3.8    Chloride 101 - 111 mmol/L 99(L)  CO2 22 - 32 mmol/L 33(H)  Calcium 8.9 - 10.3 mg/dL 9.3  Total Protein 6.5 - 8.1 g/dL 6.7  Total Bilirubin 0.3 - 1.2 mg/dL 1.1  Alkaline Phos 38 - 126 U/L 51  AST 15 - 41 U/L 25  ALT 14 - 54 U/L 19   CBC Latest Ref Rng & Units 07/31/2016  WBC 3.6 - 11.0 K/uL 5.5  Hemoglobin 12.0 - 16.0 g/dL 13.6  Hematocrit 35.0 - 47.0 % 39.8  Platelets 150 - 440 K/uL 128(L)      Assessment and plan- Patient is a 82 y.o. female with a history of low grade 8 cm just involving the posterior wall of the stomach status post resection in 2010 who is here for a routine yearly follow-up   1. Clinically she is doing well and reports no abdominal pain. I do not feel any palpable mass on today's exam. Given her age, difficulty with her mobility and poor tolerance to surgery in the past- she does not wish to pursue surveillance imaging. We will continue to follow up with her on a yearly basis. After mutual discussion it was agreed upon that we will consider imaging if she has any concerning signs or symptoms such as worsening abdominal pain, sudden anemia or bleeding in her stools (as GIST tumors can sometimes bleed)   Visit Diagnosis 1. 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 Randa Evens, MD, MPH [facility] Pager- [phone] 07/31/2016 11:19 AM

## 2016-07-31 NOTE — Progress Notes (Signed)
Patient is here for follow up, she is doing well no complaints.  

## 2016-08-03 ENCOUNTER — Encounter: Payer: Self-pay | Admitting: Emergency Medicine

## 2016-08-03 ENCOUNTER — Other Ambulatory Visit: Payer: Self-pay

## 2016-08-03 ENCOUNTER — Emergency Department: Payer: Medicare Other

## 2016-08-03 ENCOUNTER — Emergency Department
Admission: EM | Admit: 2016-08-03 | Discharge: 2016-08-03 | Disposition: A | Discharge status: Home / Self Care | Payer: Medicare Other | Attending: Emergency Medicine | Admitting: Emergency Medicine

## 2016-08-03 DIAGNOSIS — I110 Hypertensive heart disease with heart failure: Secondary | ICD-10-CM | POA: Insufficient documentation

## 2016-08-03 DIAGNOSIS — I509 Heart failure, unspecified: Secondary | ICD-10-CM | POA: Diagnosis not present

## 2016-08-03 DIAGNOSIS — R1013 Epigastric pain: Secondary | ICD-10-CM | POA: Diagnosis present

## 2016-08-03 DIAGNOSIS — N390 Urinary tract infection, site not specified: Secondary | ICD-10-CM | POA: Diagnosis not present

## 2016-08-03 DIAGNOSIS — Z79899 Other long term (current) drug therapy: Secondary | ICD-10-CM | POA: Insufficient documentation

## 2016-08-03 LAB — CBC
HCT: 41.3 % (ref 35.0–47.0)
Hemoglobin: 14.4 g/dL (ref 12.0–16.0)
MCH: 34 pg (ref 26.0–34.0)
MCHC: 34.9 g/dL (ref 32.0–36.0)
MCV: 97.3 fL (ref 80.0–100.0)
Platelets: 129 10*3/uL — ABNORMAL LOW (ref 150–440)
RBC: 4.24 MIL/uL (ref 3.80–5.20)
RDW: 13.5 % (ref 11.5–14.5)
WBC: 6.8 10*3/uL (ref 3.6–11.0)

## 2016-08-03 LAB — COMPREHENSIVE METABOLIC PANEL
ALT: 13 U/L — ABNORMAL LOW (ref 14–54)
AST: 27 U/L (ref 15–41)
Albumin: 4 g/dL (ref 3.5–5.0)
Alkaline Phosphatase: 57 U/L (ref 38–126)
Anion gap: 8 (ref 5–15)
BUN: 14 mg/dL (ref 6–20)
CO2: 28 mmol/L (ref 22–32)
Calcium: 9 mg/dL (ref 8.9–10.3)
Chloride: 104 mmol/L (ref 101–111)
Creatinine, Ser: 0.94 mg/dL (ref 0.44–1.00)
GFR calc Af Amer: >60 mL/min (ref >60)
GFR calc non Af Amer: 54 mL/min — ABNORMAL LOW (ref >60)
Glucose, Bld: 238 mg/dL — ABNORMAL HIGH (ref 65–99)
Potassium: 4.1 mmol/L (ref 3.5–5.1)
Sodium: 140 mmol/L (ref 135–145)
Total Bilirubin: 0.8 mg/dL (ref 0.3–1.2)
Total Protein: 6.7 g/dL (ref 6.5–8.1)

## 2016-08-03 LAB — URINALYSIS, COMPLETE (UACMP) WITH MICROSCOPIC
Bilirubin Urine: NEGATIVE
Glucose, UA: 50 mg/dL — AB
Hgb urine dipstick: NEGATIVE
Ketones, ur: NEGATIVE mg/dL
Nitrite: POSITIVE — AB
Protein, ur: NEGATIVE mg/dL
Specific Gravity, Urine: 1.005 (ref 1.005–1.030)
pH: 6 (ref 5.0–8.0)

## 2016-08-03 LAB — TROPONIN I: Troponin I: <0.03 ng/mL (ref <0.03)

## 2016-08-03 LAB — LIPASE, BLOOD: Lipase: 29 U/L (ref 11–51)

## 2016-08-03 MED ORDER — SUCRALFATE 1 G PO TABS: 1.0000 g | ORAL_TABLET | Freq: Four times a day (QID) | ORAL | 1 refills | Status: DC

## 2016-08-03 MED ORDER — GI COCKTAIL ~~LOC~~
30.0000 mL | Freq: Once | ORAL | Status: AC
  Administered 2016-08-03: 30 mL via ORAL
  Filled 2016-08-03: qty 30

## 2016-08-03 MED ORDER — NITROFURANTOIN MACROCRYSTAL 100 MG PO CAPS: 100.0000 mg | ORAL_CAPSULE | Freq: Four times a day (QID) | ORAL | 0 refills | Status: AC

## 2016-08-03 NOTE — ED Notes (Signed)
Pt returned from [place], resting in bed, family at bedside, pt in no acute distress

## 2016-08-03 NOTE — ED Provider Notes (Signed)
Time Seen: Approximately0959 I have reviewed the triage notes  Chief Complaint: Abdominal Pain   History of Present Illness: Dawn Burnett is a 82 y.o. female *who presents with some epigastric and right upper quadrant abdominal pain that started over the last several days but seemed to be worse today. She denies any fever or radiation to the back or flank area. He has a history of a hiatal hernia along with a "" sensitive stomach"". The patient denies any chest pain or shortness of breath. She denies any lower abdominal pain   Past Medical History:  Diagnosis Date  . Arthritis   . Atrial fibrillation ([place])   . CHF (congestive heart failure) ([place])   . GERD (gastroesophageal reflux disease)   . Hiatal hernia   . Hypertension   . Skin cancer, basal cell   . Thyroid disease     Patient Active Problem List   Diagnosis Date Noted  . Gastrointestinal stromal tumor (GIST) of stomach ([place]) 12/21/2014  . Allergic rhinitis 12/07/2014  . Arthritis 12/07/2014  . Atrial fibrillation, controlled ([place]) 12/07/2014  . Arteriosclerosis of coronary artery 12/07/2014  . Essential (primary) hypertension 12/07/2014  . Acid reflux 12/07/2014  . History of prolonged Q-T interval on ECG 12/07/2014  . Blood glucose elevated 12/07/2014  . Adult hypothyroidism 12/07/2014  . Urinary incontinence 12/07/2014  . Neuropathy ([place]) 12/07/2014  . Adiposity 12/07/2014  . Osteopenia 12/07/2014  . HLD (hyperlipidemia) 12/07/2014  . Alteration in bowel elimination: incontinence 12/07/2014  . Hiatal hernia   . Atrial fibrillation ([place])   . Basal cell carcinoma of ear 11/14/2014  . Bladder infection, chronic 06/01/2012  . Urethral caruncle 06/01/2012  . SCC (squamous cell carcinoma), face 06/16/2011  . ABDOMINAL MASS 05/16/2009  . NONSPECIFIC ABN FINDING RAD & OTH EXAM GI TRACT 04/24/2009    Past Surgical History:  Procedure Laterality Date  . ABDOMINAL HYSTERECTOMY    . APPENDECTOMY    . BREAST  SURGERY    . cateract extraction    . KNEE SURGERY    . 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 right hip surgery    . right total knee replacement      Past Surgical History:  Procedure Laterality Date  . ABDOMINAL HYSTERECTOMY    . APPENDECTOMY    . BREAST SURGERY    . cateract extraction    . KNEE SURGERY    . 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 right hip surgery    . right total knee replacement      Current Outpatient Rx  . Order #: [id number] Class: Historical Med  . Order #: [id number] Class: Historical Med  . Order #: [id number] Class: Normal  . Order #: [id number] Class: Normal  . Order #: [id number] Class: Historical Med  . Order #: [id number] Class: Historical Med  . Order #: [id number] Class: Historical Med  . Order #: [id number] Class: Normal  . Order #: [id number] Class: Historical Med  . Order #: [id number] Class: Normal  . Order #: [id number] Class: Historical Med  . Order #: [id number] Class: Historical Med  . Order #: [id number] Class: Normal  . Order #: [id number] Class: Normal  . Order #: [id number] Class: Historical Med  . Order #: [id number] Class: Normal  . Order #: [id number] Class: Normal  . Order #: [id number] Class: Normal  . Order #: [id number] Class: Print  . Order #: [id number] Class: Normal  . Order #: [id number] Class: Historical Med  . Order #: [id number] Class: Print    Allergies:  Iodinated diagnostic  agents; Augmentin [amoxicillin-pot clavulanate]; Codeine; Crestor [rosuvastatin]; Naproxen; Prednisone; Sulfa antibiotics; Sulfacetamide sodium; and Tolectin [tolmetin]  Family History: Family History  Problem Relation Age of Onset  . Heart disease Mother   . Hypertension Mother   . Heart attack Mother   . Stomach cancer Father   . Liver cancer Brother   . Hypertension Son   . Huntington's disease Sister   . Diabetes Sister   . Kidney disease Sister   . Congestive Heart Failure Sister   . Pneumonia Sister   .  Stroke Brother   . Parkinson's disease Brother   . Hypertension Brother   . Congestive Heart Failure Brother     Social History: Social History  Substance Use Topics  . Smoking status: Never Smoker  . Smokeless tobacco: Never Used  . Alcohol use No     Review of Systems:   10 point review of systems was performed and was otherwise negative:  Constitutional: No fever Eyes: No visual disturbances ENT: No sore throat, ear pain Cardiac: No chest pain Respiratory: No shortness of breath, wheezing, or stridor Abdomen: Pain primarily in the epigastric area. No nausea, vomiting or diarrhea. No melena or hematochezia Endocrine: No weight loss, No night sweats Extremities: No peripheral edema, cyanosis Skin: No rashes, easy bruising Neurologic: No focal weakness, trouble with speech or swollowing Urologic: No dysuria, Hematuria, or urinary frequency   Physical Exam:  ED Triage Vitals  Enc Vitals Group     BP 08/03/16 0935 (!) 161/90     Pulse Rate 08/03/16 1000 86     Resp 08/03/16 0935 20     Temp 08/03/16 0935 98.1 F (36.7 C)     Temp Source 08/03/16 0935 Oral     SpO2 08/03/16 1000 98 %     Weight 08/03/16 0935 184 lb (83.5 kg)     Height 08/03/16 0935 5\' 7"  (1.702 m)     Head Circumference --      Peak Flow --      Pain Score 08/03/16 0936 7     Pain Loc --      Pain Edu? --      Excl. in [place]? --     General: Awake , Alert , and Oriented times 3; GCS 15 Head: Normal cephalic , atraumatic Eyes: Pupils equal , round, reactive to light Nose/Throat: No nasal drainage, patent upper airway without erythema or exudate.  Neck: Supple, Full range of motion, No anterior adenopathy or palpable thyroid masses Lungs: Clear to ascultation without wheezes , rhonchi, or rales Heart: Regular rate, regular rhythm without murmurs , gallops , or rubs Abdomen: Mild tenderness epigastric region and in the right upper quadrant is no rebound guarding rigidity negative Murphy's sign.       Extremities: 2 plus symmetric pulses. No edema, clubbing or cyanosis Neurologic: normal ambulation, Motor symmetric without deficits, sensory intact Skin: warm, dry, no rashes   Labs:   All laboratory work was reviewed including any pertinent negatives or positives listed below:  Labs Reviewed  COMPREHENSIVE METABOLIC PANEL - Abnormal; Notable for the following:       Result Value   Glucose, Bld 238 (*)    ALT 13 (*)    GFR calc non Af Amer 54 (*)    All other components within normal limits  CBC - Abnormal; Notable for the following:    Platelets 129 (*)    All other components within normal limits  URINALYSIS, COMPLETE (UACMP) WITH MICROSCOPIC - Abnormal;  Notable for the following:    Color, Urine YELLOW (*)    APPearance CLEAR (*)    Glucose, UA 50 (*)    Nitrite POSITIVE (*)    Leukocytes, UA MODERATE (*)    Bacteria, UA MANY (*)    Squamous Epithelial / LPF 0-5 (*)    All other components within normal limits  URINE CULTURE  LIPASE, BLOOD  TROPONIN I  Patient has findings consistent with urinary tract infection with urine culture pending  EKG:  ED ECG REPORT I, Daymon Larsen, the attending physician, personally viewed and interpreted this ECG.  Date: 08/03/2016 EKG Time: 0958 Rate: 81 Rhythm: Atrial fibrillation QRS Axis: normal Intervals: Left bundle branch block ST/T Wave abnormalities: normal Conduction Disturbances: none Narrative Interpretation: unremarkable No acute ischemic changes   Radiology:  "US Abdomen Limited Ruq  Result Date: 08/03/2016 CLINICAL DATA:  Epigastric pain. Post gastrointestinal stromal tumor section in November 2017. EXAM: US ABDOMEN LIMITED - RIGHT UPPER QUADRANT COMPARISON:  CT of the abdomen 01/27/2016 FINDINGS: Gallbladder: No gallstones or wall thickening visualized. No sonographic Murphy sign noted by sonographer. Common bile duct: Diameter: 6.2 mm. Liver: No focal lesion identified.  Slightly nodular contour of the liver.  IMPRESSION: No evidence of cholelithiasis or acute cholecystitis. Slightly nodular contour of the liver, which may be seen with early cirrhotic changes. Electronically Signed   By: Fidela Salisbury M.D.   On: 08/03/2016 12:42  "  I personally reviewed the radiologic studies      ED Course: * Patient's stay here was uneventful and she had initial improvement after a GI cocktail. I felt this was unlikely to be ischemic bowel or acute cholecystitis especially based on objective findings at this time. Patient's otherwise hemodynamically stable and I felt could be treated on an outpatient basis for what is likely gastritis. It is consistent with a urinary tract infection though denies any dysuria or hematuria. Clinical Course      Final Clinical Impression:   Final diagnoses:  Epigastric pain  Acute urinary tract infection   Plan:  Outpatient " Discharge Medication List as of 08/03/2016 12:54 PM    START taking these medications   Details  nitrofurantoin (MACRODANTIN) 100 MG capsule Take 1 capsule (100 mg total) by mouth 4 (four) times daily., Starting Mon 08/03/2016, Until Mon 08/10/2016, Print    sucralfate (CARAFATE) 1 g tablet Take 1 tablet (1 g total) by mouth 4 (four) times daily., Starting Mon 08/03/2016, Until Tue 08/03/2017, Print      "  Patient was advised to return immediately if condition worsens. Patient was advised to follow up with their primary care physician or other specialized physicians involved in their outpatient care. The patient and/or family member/power of attorney had laboratory results reviewed at the bedside. All questions and concerns were addressed and appropriate discharge instructions were distributed by the nursing staff.             Daymon Larsen, MD 08/03/16 [phone]

## 2016-08-03 NOTE — Discharge Instructions (Signed)
Please return immediately if condition worsens. Please contact her primary physician or the physician you were given for referral. If you have any specialist physicians involved in her treatment and plan please also contact them. Thank you for using [place] [facility]. ° °

## 2016-08-03 NOTE — ED Notes (Signed)
EDP at bedside  

## 2016-08-03 NOTE — ED Triage Notes (Signed)
Pt from home with ruq pain x 12 week, worse today. Denies n/v/d. Pt alert & oriented with nad noted.

## 2016-08-05 ENCOUNTER — Ambulatory Visit (INDEPENDENT_AMBULATORY_CARE_PROVIDER_SITE_OTHER): Payer: Medicare Other

## 2016-08-05 DIAGNOSIS — I4891 Unspecified atrial fibrillation: Secondary | ICD-10-CM

## 2016-08-05 LAB — POCT INR
INR: 2.6
PT: 30.9

## 2016-08-05 LAB — URINE CULTURE: Culture: >=100000 — AB

## 2016-08-05 NOTE — Patient Instructions (Signed)
Anticoagulation Dose Instructions as of 08/05/2016      Dawn Burnett Tue Wed Thu Fri Sat   New Dose 5 mg 4 mg 4 mg 5 mg 4 mg 4 mg 4 mg    Description   4 mg daily except 5 mg on Wed and Sun.  F/U 1 weeks due to the antibiotic use

## 2016-08-12 ENCOUNTER — Encounter: Payer: Self-pay | Admitting: Family Medicine

## 2016-08-12 ENCOUNTER — Ambulatory Visit: Payer: Self-pay

## 2016-08-12 ENCOUNTER — Ambulatory Visit (INDEPENDENT_AMBULATORY_CARE_PROVIDER_SITE_OTHER): Payer: Medicare Other | Admitting: Family Medicine

## 2016-08-12 VITALS — BP 124/60 | HR 72 | Temp 97.8°F | Resp 16

## 2016-08-12 DIAGNOSIS — R1013 Epigastric pain: Secondary | ICD-10-CM | POA: Diagnosis not present

## 2016-08-12 DIAGNOSIS — I4891 Unspecified atrial fibrillation: Secondary | ICD-10-CM

## 2016-08-12 LAB — POCT INR
INR: 2.6
PT: 30.9

## 2016-08-12 NOTE — Progress Notes (Signed)
Patient: Dawn Burnett Female    DOB: [birth year]   83 y.o.   MRN: [id number] Visit Date: 08/12/2016  Today's Provider: [name], MD   Chief Complaint  Patient presents with  . Follow-up  . Anticoagulation   Subjective:    HPI     Follow up ER visit  Patient was seen in ER for RUQ and epigastric pain on 08/03/2016. She was treated for UTI and epigastric pain. Treatment for this included Macrobid and Carafate. She reports good compliance with treatment. Pt finished abx on 08/10/2016. She reports this condition is Improved.  ------------------------------------------------------------------------------------  Anticoagulation Pt also needs INR repeated today due to recent abx use. Is on coumadin 4 mg daily except 5 mg on Sunday and Wednesday. Pt needs anticoagulation due to A Fib.  Allergies  Allergen Reactions  . Iodinated Diagnostic Agents Rash  . Augmentin [Amoxicillin-Pot Clavulanate] Other (See Comments)    Gi distress   . Codeine   . Crestor [Rosuvastatin] Other (See Comments)    Unknown Unknown  . Naproxen Other (See Comments)    unknown unknown  . Prednisone   . Sulfa Antibiotics   . Sulfacetamide Sodium   . Tolectin [Tolmetin]      Current Outpatient Prescriptions:  .  acetaminophen (TYLENOL) 500 MG tablet, Take 500 mg by mouth every 6 (six) hours as needed for mild pain, moderate pain, fever or headache., Disp: , Rfl:  .  atorvastatin (LIPITOR) 20 MG tablet, Take 20 mg by mouth at bedtime. , Disp: , Rfl:  .  Calcium-Vitamin D 600-200 MG-UNIT per tablet, Take 600 tablets by mouth daily with supper. , Disp: , Rfl:  .  celecoxib (CELEBREX) 200 MG capsule, Take 1 capsule (200 mg total) by mouth daily., Disp: 30 capsule, Rfl: 12 .  COUMADIN 1 MG tablet, TAKE 1 (ONE) TABLET TABLET, ORAL, DAILY AS DIRECTED, Disp: 30 tablet, Rfl: 11 .  COUMADIN 4 MG tablet, TAKE 1 TABLET AS DIRECTED (Patient taking differently: Take 4mg  every day except Sunday and  Wednesday, take 5mg  on Sunday and Wednesday), Disp: 30 tablet, Rfl: 12 .  diltiazem (CARDIZEM CD) 120 MG 24 hr capsule, Take 120 mg by mouth daily., Disp: , Rfl:  .  ENTRESTO 24-26 MG, Take 1 tablet by mouth 2 (two) times daily. , Disp: , Rfl:  .  fluticasone (FLONASE) 50 MCG/ACT nasal spray, Place 2 sprays into both nostrils daily., Disp: 16 g, Rfl: 6 .  furosemide (LASIX) 20 MG tablet, Take 20 mg by mouth daily. , Disp: , Rfl:  .  gabapentin (NEURONTIN) 600 MG tablet, Take 1 tablet (600 mg total) by mouth 3 (three) times daily., Disp: 90 tablet, Rfl: 11 .  imipramine (TOFRANIL) 25 MG tablet, Take 25 mg by mouth at bedtime. , Disp: , Rfl:  .  levothyroxine (SYNTHROID, LEVOTHROID) 50 MCG tablet, Take 1 tablet (50 mcg total) by mouth daily before breakfast., Disp: 30 tablet, Rfl: 2 .  metoprolol (LOPRESSOR) 100 MG tablet, Take 100 mg by mouth 2 (two) times daily. , Disp: , Rfl:  .  Multiple Vitamins-Minerals (CENTRUM SILVER ULTRA WOMENS PO), Take 1 tablet by mouth daily with lunch. , Disp: , Rfl:  .  nystatin cream (MYCOSTATIN), APPLY  TOPICALLY 2 (TWO) TIMES DAILY. APPLY TO AFFECTED AREA, Disp: 30 g, Rfl: 0 .  omeprazole (PRILOSEC) 20 MG capsule, TAKE ONE CAPSULE DAILY, Disp: 30 capsule, Rfl: 11 .  potassium chloride (K-DUR) 10 MEQ tablet, Take 10  mEq by mouth daily. Reported on 12/24/2015, Disp: , Rfl:  .  ranitidine (ZANTAC) 150 MG tablet, TAKE 1 TABLET BY MOUTH TWO TIMES DAILY, Disp: 60 tablet, Rfl: 12 .  sucralfate (CARAFATE) 1 g tablet, Take 1 tablet (1 g total) by mouth 4 (four) times daily., Disp: 120 tablet, Rfl: 1  Review of Systems  Constitutional: Negative for activity change, appetite change, chills, diaphoresis, fatigue, fever and unexpected weight change.  Gastrointestinal: Negative for abdominal pain.  Genitourinary: Negative for dysuria, frequency and urgency.  Hematological: Bruises/bleeds easily.    Social History  Substance Use Topics  . Smoking status: Never Smoker  .  Smokeless tobacco: Never Used  . Alcohol use No   Objective:   BP 124/60 (BP Location: Right Arm, Patient Position: Sitting, Cuff Size: Large)   Pulse 72   Temp 97.8 F (36.6 C) (Oral)   Resp 16   Physical Exam  Constitutional: She appears well-developed and well-nourished.  Cardiovascular: Normal rate and regular rhythm.   Pulmonary/Chest: Effort normal and breath sounds normal. No respiratory distress.  Abdominal: Soft. There is no tenderness.  Musculoskeletal: She exhibits no edema.  Psychiatric: She has a normal mood and affect. Her behavior is normal.        Assessment & Plan:     1. Atrial fibrillation, controlled ([place]) Continue same dose Coumadin. Will recheck at FU. - POCT INR Results for orders placed or performed in visit on 08/12/16  POCT INR  Result Value Ref Range   INR 2.6    PT 30.9      2. Epigastric pain Stable. Continue Carafate, but decrease Prilosec to 1 tablet daily. Call for refills if needed. FU 4 weeks.     Patient seen and examined by Miguel Aschoff, MD, and note scribed by Renaldo Fiddler, CMA. I have done the exam and reviewed the above chart and it is accurate to the best of my knowledge. Development worker, community has been used in this note in any air is in the dictation or transcription are unintentional.  [name], MD  [place]

## 2016-08-12 NOTE — Patient Instructions (Signed)
Anticoagulation Dose Instructions as of 08/12/2016      Dorene Grebe Tue Wed Thu Fri Sat   New Dose 5 mg 4 mg 4 mg 5 mg 4 mg 4 mg 4 mg    Description   4 mg daily except 5 mg on Wed and Sun.  FU 4 weeks.

## 2016-09-01 ENCOUNTER — Ambulatory Visit (INDEPENDENT_AMBULATORY_CARE_PROVIDER_SITE_OTHER): Payer: Medicare Other | Admitting: Family Medicine

## 2016-09-01 DIAGNOSIS — R531 Weakness: Secondary | ICD-10-CM

## 2016-09-01 DIAGNOSIS — R1013 Epigastric pain: Secondary | ICD-10-CM

## 2016-09-01 DIAGNOSIS — G629 Polyneuropathy, unspecified: Secondary | ICD-10-CM | POA: Diagnosis not present

## 2016-09-01 DIAGNOSIS — E038 Other specified hypothyroidism: Secondary | ICD-10-CM

## 2016-09-01 DIAGNOSIS — B372 Candidiasis of skin and nail: Secondary | ICD-10-CM

## 2016-09-01 DIAGNOSIS — E1121 Type 2 diabetes mellitus with diabetic nephropathy: Secondary | ICD-10-CM | POA: Insufficient documentation

## 2016-09-01 DIAGNOSIS — I10 Essential (primary) hypertension: Secondary | ICD-10-CM | POA: Diagnosis not present

## 2016-09-01 DIAGNOSIS — E784 Other hyperlipidemia: Secondary | ICD-10-CM

## 2016-09-01 DIAGNOSIS — J0120 Acute ethmoidal sinusitis, unspecified: Secondary | ICD-10-CM

## 2016-09-01 DIAGNOSIS — E1165 Type 2 diabetes mellitus with hyperglycemia: Secondary | ICD-10-CM | POA: Insufficient documentation

## 2016-09-01 DIAGNOSIS — I4891 Unspecified atrial fibrillation: Secondary | ICD-10-CM | POA: Diagnosis not present

## 2016-09-01 DIAGNOSIS — E7849 Other hyperlipidemia: Secondary | ICD-10-CM

## 2016-09-01 LAB — POCT INR
INR: 2.1
PT: 25.8

## 2016-09-01 LAB — POCT GLYCOSYLATED HEMOGLOBIN (HGB A1C): Hemoglobin A1C: 7.1

## 2016-09-01 MED ORDER — LEVOFLOXACIN 500 MG PO TABS: 500.0000 mg | ORAL_TABLET | Freq: Every day | ORAL | 0 refills | Status: DC

## 2016-09-01 MED ORDER — NYSTATIN 100000 UNIT/GM EX CREA
TOPICAL_CREAM | CUTANEOUS | 1 refills | Status: DC
Start: 2016-09-01 — End: 2021-04-08

## 2016-09-01 NOTE — Patient Instructions (Addendum)
Anticoagulation Dose Instructions as of 09/01/2016      Dorene Grebe Tue Wed Thu Fri Sat   New Dose 4 mg 5 mg 4 mg 5 mg 4 mg 5 mg 4 mg    Description   4 mg daily except 5 mg on Monday, Wednesday and Friday.

## 2016-09-01 NOTE — Progress Notes (Signed)
Dawn LEHMKUHL  MRN: [id number] DOB: 1929-01-11  Subjective:  HPI  Last office visit was 08/12/16 for epigastric pain follow up and INR checked. Coumadin does was not changed. She was advised to continue taking Carafate and decreased Prilosec to 1 tablet daily. Last follow up for routine issues was on 04/28/12. She does not check her sugar at home. INR today is 2.1 and PT 25.8 sec. She takes 4 mg daily except 5 mg on Sunday and Wednesday. Lab Results  Component Value Date   HGBA1C 7.3 04/28/2016   BP Readings from Last 3 Encounters:  08/12/16 124/60  08/03/16 (!) 147/84  07/31/16 126/70   Last labs-CBC and MetC on 08/03/16 and TSH and lipid 12/25/15. She has a few concerns to discuss. 1) She has a sore in the right ear and would like that to be looked at. 2) She has been having a lot of nasal congestion and her son that lives with her has the flu. 3) she has a break out rash in the left groin-thinks maybe its yeast infection. 4) in the past 2 weeks has had sensation of weakness at times when she is walking with her walker and feeling like she will fall down. No other symptoms present at that time. She will sit down when this happens and the sensation resolves shortly after resting. Denies chest pain or tightness, no shortness of breath. She does check her b/p after these episodes and readings will be high like 155/90 or so. These episodes becoming more frequent, no certain pattern to them that she knows of. Patient Active Problem List   Diagnosis Date Noted  . 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 Allergic rhinitis 12/07/2014  . Arthritis 12/07/2014  . Atrial fibrillation, controlled ([place]) 12/07/2014  . Arteriosclerosis of coronary artery 12/07/2014  . Essential (primary) hypertension 12/07/2014  . Acid reflux 12/07/2014  . History of prolonged Q-T interval on ECG 12/07/2014  . Blood glucose elevated 12/07/2014  . Adult hypothyroidism 12/07/2014  .  Urinary incontinence 12/07/2014  . Neuropathy ([place]) 12/07/2014  . Adiposity 12/07/2014  . Osteopenia 12/07/2014  . HLD (hyperlipidemia) 12/07/2014  . Alteration in bowel elimination: incontinence 12/07/2014  . Hiatal hernia   . Atrial fibrillation ([place])   . Basal cell carcinoma of ear 11/14/2014  . Bladder infection, chronic 06/01/2012  . Urethral caruncle 06/01/2012  . SCC (squamous cell carcinoma), face 06/16/2011  . ABDOMINAL MASS 05/16/2009  . NONSPECIFIC ABN FINDING RAD & OTH EXAM GI TRACT 04/24/2009    Past Medical History:  Diagnosis Date  . Arthritis   . Atrial fibrillation ([place])   . CHF (congestive heart failure) ([place])   . GERD (gastroesophageal reflux disease)   . Hiatal hernia   . Hypertension   . Skin cancer, basal cell   . Thyroid disease     Social History   Social History  . Marital status: Widowed    Spouse name: widowed  . Number of children: 2  . Years of education: 12   Occupational History  . retired    Social History Main Topics  . Smoking status: Never Smoker  . Smokeless tobacco: Never Used  . Alcohol use No  . Drug use: No  . Sexual activity: No   Other Topics Concern  . Not on file   Social History Narrative  . No narrative on file    Outpatient Encounter Prescriptions as of 09/01/2016  Medication Sig Note  . acetaminophen (TYLENOL) 500  MG tablet Take 500 mg by mouth every 6 (six) hours as needed for mild pain, moderate pain, fever or headache.   Marland Kitchen atorvastatin (LIPITOR) 20 MG tablet Take 20 mg by mouth at bedtime.    . Calcium-Vitamin D 600-200 MG-UNIT per tablet Take 600 tablets by mouth daily with supper.    . celecoxib (CELEBREX) 200 MG capsule Take 1 capsule (200 mg total) by mouth daily.   Marland Kitchen COUMADIN 1 MG tablet TAKE 1 (ONE) TABLET TABLET, ORAL, DAILY AS DIRECTED   . COUMADIN 4 MG tablet TAKE 1 TABLET AS DIRECTED (Patient taking differently: Take 4mg  every day except Sunday and Wednesday, take 5mg  on Sunday and Wednesday)  08/03/2016: Took 5mg  yesterday  . diltiazem (CARDIZEM CD) 120 MG 24 hr capsule Take 120 mg by mouth daily.   Marland Kitchen ENTRESTO 24-26 MG Take 1 tablet by mouth 2 (two) times daily.    . fluticasone (FLONASE) 50 MCG/ACT nasal spray Place 2 sprays into both nostrils daily.   . furosemide (LASIX) 20 MG tablet Take 20 mg by mouth daily.    Marland Kitchen gabapentin (NEURONTIN) 600 MG tablet Take 1 tablet (600 mg total) by mouth 3 (three) times daily.   Marland Kitchen imipramine (TOFRANIL) 25 MG tablet Take 25 mg by mouth at bedtime.    Marland Kitchen levothyroxine (SYNTHROID, LEVOTHROID) 50 MCG tablet Take 1 tablet (50 mcg total) by mouth daily before breakfast.   . metoprolol (LOPRESSOR) 100 MG tablet Take 100 mg by mouth 2 (two) times daily.    . Multiple Vitamins-Minerals (CENTRUM SILVER ULTRA WOMENS PO) Take 1 tablet by mouth daily with lunch.    . nystatin cream (MYCOSTATIN) APPLY  TOPICALLY 2 (TWO) TIMES DAILY. APPLY TO AFFECTED AREA   . omeprazole (PRILOSEC) 20 MG capsule TAKE ONE CAPSULE DAILY   . potassium chloride (K-DUR) 10 MEQ tablet Take 10 mEq by mouth daily. Reported on 12/24/2015   . ranitidine (ZANTAC) 150 MG tablet TAKE 1 TABLET BY MOUTH TWO TIMES DAILY   . sucralfate (CARAFATE) 1 g tablet Take 1 tablet (1 g total) by mouth 4 (four) times daily.    No facility-administered encounter medications on file as of 09/01/2016.     Allergies  Allergen Reactions  . Iodinated Diagnostic Agents Rash  . Augmentin [Amoxicillin-Pot Clavulanate] Other (See Comments)    Gi distress   . Codeine   . Crestor [Rosuvastatin] Other (See Comments)    Unknown Unknown  . Naproxen Other (See Comments)    unknown unknown  . Prednisone   . Sulfa Antibiotics   . Sulfacetamide Sodium   . Tolectin [Tolmetin]     Review of Systems  Constitutional: Positive for malaise/fatigue.  HENT: Positive for congestion and ear pain.        Sore in the right ear, sometimes runny nose.  Eyes: Negative.   Respiratory: Negative.   Cardiovascular:  Negative.   Gastrointestinal: Negative.   Genitourinary: Negative.   Musculoskeletal: Positive for joint pain.       Joint stiffness  Skin: Positive for rash.  Neurological: Positive for tingling (and numbness-chronic-gradually worsening) and weakness. Negative for dizziness and headaches.  Endo/Heme/Allergies: Negative.   Psychiatric/Behavioral: Negative.     Objective:  There were no vitals taken for this visit.  Physical Exam  Constitutional: She is oriented to person, place, and time and well-developed, well-nourished, and in no distress.  HENT:  Head: Normocephalic and atraumatic.  Right Ear: External ear normal.  Left Ear: External ear normal.  Nose: Nose normal.  Eyes: Conjunctivae are normal. Pupils are equal, round, and reactive to light.  Neck: Normal range of motion. Neck supple.  Cardiovascular: Normal rate, regular rhythm, normal heart sounds and intact distal pulses.   No murmur heard. Pulmonary/Chest: Effort normal and breath sounds normal. No respiratory distress. She has no wheezes.  Abdominal: Soft.  Musculoskeletal: She exhibits no edema or tenderness.  Has support hose on.  Neurological: She is alert and oriented to person, place, and time.  Skin: Skin is warm and dry.  Psychiatric: Mood, memory, affect and judgment normal.  fibrous tissue left lower arm 2 cm by 1 cm firm but mobile  Assessment and Plan :  1. Atrial fibrillation, controlled ([facility]) INR today 2.1. Change coumadin to taking 4 mg daily except 5 mg on M/W/F. Re check in 1 week due to been put on Levaquin today. - POCT INR  2. Type 2 diabetes mellitus with diabetic nephropathy, without long-term current use of insulin ([facility]) A1C 7.1 better. Continue current regimen. - POCT HgB A1C  3. Epigastric pain Resolved at this time.  4. Essential (primary) hypertension Stable today. 5. Other specified hypothyroidism 6. Other hyperlipidemia  7. Neuropathy ([facility]) Stable. Gradually worsening per  patient.  8. Weakness EKG stable. I think symptoms she is having is multifactorial. I think she is deconditioning. I advised patient to call Dr Humphrey Rolls and see if they need to move up her cardiology appointment up. - EKG 12-Lead  9. Skin yeast infection Treat with Nystatin cream. Follow as needed.  HPI, Exam and A&P transcribed under direction and in the presence of Miguel Aschoff, MD. I have done the exam and reviewed the chart and it is accurate to the best of my knowledge. Development worker, community has been used and  any errors in dictation or transcription are unintentional. Miguel Aschoff M.D. [facility]

## 2016-09-04 ENCOUNTER — Other Ambulatory Visit: Payer: Self-pay

## 2016-09-04 MED ORDER — DOXYCYCLINE HYCLATE 100 MG PO TABS: 100.0000 mg | ORAL_TABLET | Freq: Two times a day (BID) | ORAL | 0 refills | Status: DC

## 2016-09-04 NOTE — Progress Notes (Signed)
Doxycycline for a week but she'll need PT next week

## 2016-09-04 NOTE — Progress Notes (Signed)
Patient called and states we saw her this week and Levaquin was started for sinuses and she states she can not take this anymore. She had severe body pain and stomach pain last night. She is blowing blood from her nose and feels like she needs something for her sinuses still . Her allergies/intolerance are Augmentin, prednisone, sulfa per her chart. Please review-aa

## 2016-09-04 NOTE — Progress Notes (Signed)
Pt advised, RX sent in-aa 

## 2016-09-09 ENCOUNTER — Ambulatory Visit (INDEPENDENT_AMBULATORY_CARE_PROVIDER_SITE_OTHER): Payer: Medicare Other

## 2016-09-09 DIAGNOSIS — I4891 Unspecified atrial fibrillation: Secondary | ICD-10-CM

## 2016-09-09 LAB — POCT INR
INR: 2.5
PT: 29.5

## 2016-09-09 NOTE — Patient Instructions (Signed)
Anticoagulation Dose Instructions as of 09/09/2016      Dorene Grebe Tue Wed Thu Fri Sat   New Dose 4 mg 5 mg 4 mg 5 mg 4 mg 5 mg 4 mg    Description   4 mg daily except 5 mg on Monday, Wednesday and Friday.

## 2016-09-10 ENCOUNTER — Telehealth: Payer: Self-pay | Admitting: Family Medicine

## 2016-09-10 NOTE — Telephone Encounter (Signed)
Called Pt to schedule AWV with [facility] 4/24 @ 10:45am - knb

## 2016-09-10 NOTE — Telephone Encounter (Signed)
Patient scheduled AWE on 12/01/2016 at 10:45 am.

## 2016-09-18 ENCOUNTER — Telehealth: Payer: Self-pay | Admitting: Emergency Medicine

## 2016-09-18 ENCOUNTER — Encounter: Payer: Self-pay | Admitting: Family Medicine

## 2016-09-18 ENCOUNTER — Ambulatory Visit (INDEPENDENT_AMBULATORY_CARE_PROVIDER_SITE_OTHER): Payer: Medicare Other | Admitting: Emergency Medicine

## 2016-09-18 DIAGNOSIS — I4891 Unspecified atrial fibrillation: Secondary | ICD-10-CM | POA: Diagnosis not present

## 2016-09-18 LAB — POCT INR
INR: 4.6
PT: 55.1

## 2016-09-18 NOTE — Telephone Encounter (Signed)
Pt was in the office today for her PT/INR and mentioned that she was having eye surgery on 10/01/16 and her surgeon wants to know how many days she can safely be off the warfarin. She thinks they want her to be off of it for about 3 days prior. Please advise. Thanks.   FYI- Her pt today (09/18/16) was 4.6. Consulted with Tawanna Sat and she told her to hold it for 2 days and recheck next week.

## 2016-09-18 NOTE — Patient Instructions (Signed)
Anticoagulation Dose Instructions as of 09/18/2016      Dawn Burnett Tue Wed Thu Fri Sat   New Dose 4 mg 5 mg 4 mg 5 mg 4 mg 5 mg 4 mg    Description   Hold for 2 days then continue 4 mg daily except 5 mg on Monday, Wednesday and Friday.

## 2016-09-22 NOTE — Telephone Encounter (Signed)
3 days is fine

## 2016-09-23 ENCOUNTER — Ambulatory Visit (INDEPENDENT_AMBULATORY_CARE_PROVIDER_SITE_OTHER): Payer: Medicare Other

## 2016-09-23 DIAGNOSIS — I4891 Unspecified atrial fibrillation: Secondary | ICD-10-CM | POA: Diagnosis not present

## 2016-09-23 LAB — POCT INR
INR: 2.8
PT: 33.4

## 2016-09-23 NOTE — Telephone Encounter (Signed)
Discussed this with Dr Rosanna Randy, pt will come in at 1:30 for INR only-aa

## 2016-09-23 NOTE — Telephone Encounter (Signed)
Pt advised of below. Patient states that she thinks her INR is still abnormal and has appointment schedueld for Friday to re check it but due to Dr Rosanna Randy not been here she wants to try and come today. Patient will let me know if she can around 1:30 or 2 pm be here. Also she states Dr Jacqlyn Larsen put her on Myrbetriq for her bladder and she thinks maybe that medication is causing her INR levels to be abnormal-aa

## 2016-09-23 NOTE — Patient Instructions (Signed)
Anticoagulation Dose Instructions as of 09/23/2016      Dawn Burnett Tue Wed Thu Fri Sat   New Dose 4 mg 5 mg 4 mg 5 mg 4 mg 5 mg 4 mg    Description   Hold for 3 days prior to surgery, then continue 4 mg daily except 5 mg on Monday, Wednesday and Friday.  Recheck 2 weeks, after surgery.

## 2016-09-23 NOTE — Telephone Encounter (Signed)
INT only should be Elenas schedule today. Myrbetriq should have little effect.

## 2016-09-25 ENCOUNTER — Ambulatory Visit: Payer: Self-pay

## 2016-10-01 ENCOUNTER — Telehealth: Payer: Self-pay

## 2016-10-01 NOTE — Telephone Encounter (Signed)
Error

## 2016-10-05 ENCOUNTER — Ambulatory Visit: Payer: Medicare Other | Admitting: Podiatry

## 2016-10-07 ENCOUNTER — Ambulatory Visit (INDEPENDENT_AMBULATORY_CARE_PROVIDER_SITE_OTHER): Payer: Medicare Other

## 2016-10-07 DIAGNOSIS — I4891 Unspecified atrial fibrillation: Secondary | ICD-10-CM

## 2016-10-07 LAB — POCT INR
INR: 1.5
PT: 17.4

## 2016-10-07 NOTE — Patient Instructions (Signed)
Anticoagulation Dose Instructions as of 10/07/2016      Dorene Grebe Tue Wed Thu Fri Sat   New Dose 4 mg 5 mg 4 mg 5 mg 4 mg 5 mg 4 mg    Description   Double today's dose, then continue 4 mg daily except 5 mg on Monday, Wednesday and Friday.  Recheck 1 week

## 2016-10-14 ENCOUNTER — Ambulatory Visit (INDEPENDENT_AMBULATORY_CARE_PROVIDER_SITE_OTHER): Payer: Medicare Other

## 2016-10-14 DIAGNOSIS — I4891 Unspecified atrial fibrillation: Secondary | ICD-10-CM | POA: Diagnosis not present

## 2016-10-14 LAB — POCT INR
INR: 3
PT: 35.5

## 2016-10-14 NOTE — Patient Instructions (Signed)
Anticoagulation Dose Instructions as of 10/14/2016      Dawn Burnett Tue Wed Thu Fri Sat   New Dose 4 mg 5 mg 4 mg 5 mg 4 mg 5 mg 4 mg    Description   Continue 4 mg daily except 5 mg on Monday, Wednesday and Friday.  Recheck 4 weeks

## 2016-10-19 ENCOUNTER — Ambulatory Visit: Payer: Medicare Other | Admitting: Podiatry

## 2016-10-26 ENCOUNTER — Encounter: Payer: Self-pay | Admitting: Podiatry

## 2016-10-26 ENCOUNTER — Ambulatory Visit (INDEPENDENT_AMBULATORY_CARE_PROVIDER_SITE_OTHER): Payer: Medicare Other | Admitting: Podiatry

## 2016-10-26 DIAGNOSIS — M79676 Pain in unspecified toe(s): Secondary | ICD-10-CM | POA: Diagnosis not present

## 2016-10-26 DIAGNOSIS — M2011 Hallux valgus (acquired), right foot: Secondary | ICD-10-CM

## 2016-10-26 DIAGNOSIS — B351 Tinea unguium: Secondary | ICD-10-CM

## 2016-10-26 NOTE — Progress Notes (Signed)
Complaint:  Visit Type: Patient returns to my office for continued preventative foot care services. Complaint: Patient states" my nails have grown long and thick and become painful to walk and wear shoes" Patient has been diagnosed with hyperglycemia with neuropathy.. The patient presents for preventative foot care services. No changes to ROS  Podiatric Exam: Vascular: dorsalis pedis and posterior tibial pulses are palpable bilateral. Capillary return is immediate. Temperature gradient is WNL. Skin turgor WNL  Dorsal skin lesions due to vascular. Sensorium: Normal Semmes Weinstein monofilament test. Normal tactile sensation bilaterally. Nail Exam: Pt has thick disfigured discolored nails with subungual debris noted bilateral entire nail hallux through fifth toenails Ulcer Exam: There is no evidence of ulcer or pre-ulcerative changes or infection. Orthopedic Exam: Muscle tone and strength are WNL. No limitations in general ROM. No crepitus or effusions noted. Foot type and digits show no abnormalities.  HAV  B/L Skin: No Porokeratosis. No infection or ulcers  Diagnosis:  Onychomycosis, , Pain in right toe, pain in left toes  Treatment & Plan Procedures and Treatment: Consent by patient was obtained for treatment procedures. The patient understood the discussion of treatment and procedures well. All questions were answered thoroughly reviewed. Debridement of mycotic and hypertrophic toenails, 1 through 5 bilateral and clearing of subungual debris. No ulceration, no infection noted. Told her her bunion is inflamed which is due to pressure.  Told to soak and wear house shoes. Return Visit-Office Procedure: Patient instructed to return to the office for a follow up visit 3 months for continued evaluation and treatment.    Gardiner Barefoot DPM

## 2016-11-03 ENCOUNTER — Other Ambulatory Visit: Payer: Self-pay | Admitting: Family Medicine

## 2016-11-09 ENCOUNTER — Other Ambulatory Visit: Payer: Self-pay | Admitting: Family Medicine

## 2016-11-09 DIAGNOSIS — E039 Hypothyroidism, unspecified: Secondary | ICD-10-CM

## 2016-11-11 ENCOUNTER — Ambulatory Visit (INDEPENDENT_AMBULATORY_CARE_PROVIDER_SITE_OTHER): Payer: Medicare Other

## 2016-11-11 DIAGNOSIS — I4891 Unspecified atrial fibrillation: Secondary | ICD-10-CM

## 2016-11-11 LAB — POCT INR
INR: 2.7
PT: 32.3

## 2016-11-11 NOTE — Patient Instructions (Signed)
Anticoagulation Dose Instructions as of 11/11/2016      Dawn Burnett Tue Wed Thu Fri Sat   New Dose 4 mg 5 mg 4 mg 5 mg 4 mg 5 mg 4 mg    Description   Continue 4 mg daily except 5 mg on Monday, Wednesday and Friday.  Recheck 4 weeks

## 2016-11-16 ENCOUNTER — Other Ambulatory Visit: Payer: Self-pay | Admitting: Family Medicine

## 2016-11-20 NOTE — Telephone Encounter (Signed)
Pt contacted office for refill request on the following medications: celecoxib (CELEBREX) 200 MG capsule  Last Rx: March 2017 [facility]  The pharmacy sent refill request on 11/16/16. Pt is requesting this is sent in today if possible because she will be out of the medication. Please advise. Thanks TNP

## 2016-11-20 NOTE — Telephone Encounter (Signed)
Looked back in chart at notes about the refill in the past. Pt had severe pain with out medication and risk factors have been discussed with her numerous times.

## 2016-11-23 NOTE — Telephone Encounter (Signed)
Ok to rf? 

## 2016-11-27 ENCOUNTER — Encounter: Payer: Self-pay | Admitting: Podiatry

## 2016-11-27 ENCOUNTER — Ambulatory Visit (INDEPENDENT_AMBULATORY_CARE_PROVIDER_SITE_OTHER): Payer: Medicare Other | Admitting: Podiatry

## 2016-11-27 DIAGNOSIS — M2011 Hallux valgus (acquired), right foot: Secondary | ICD-10-CM | POA: Diagnosis not present

## 2016-11-27 DIAGNOSIS — M21611 Bunion of right foot: Secondary | ICD-10-CM

## 2016-11-28 NOTE — Progress Notes (Signed)
   Subjective: Patient is an 83 year old female presenting today with a new complaint of a gradually worsening, painful bunion to the medial side of the right foot that has been present for the past week. She has not done anything for treatment and states she does not want surgery or injections. She denies any trauma or injury.    Objective/Physical Exam Physical Exam General: The patient is alert and oriented x3 in no acute distress.  Dermatology: Skin is cool, dry and supple bilateral lower extremities. Negative for open lesions or macerations.  Vascular: Palpable pedal pulses bilaterally. No edema or erythema noted. Capillary refill within normal limits.  Neurological: Epicritic and protective threshold grossly intact bilaterally.   Musculoskeletal Exam: Clinical evidence of bunion deformity noted to the respective foot. There is a moderate pain on palpation range of motion of the first MPJ. Lateral deviation of the hallux noted consistent with hallux abductovalgus.    Assessment: #1 bunion right foot   Plan of Care:  #1 Patient was evaluated. #2 bunion pads dispensed #3 return to clinic with Dr. Prudence Davidson for routine nail care   Edrick Kins, DPM [facility]  Dr. Edrick Kins, [place]                                        [place], [place] [address]                Office [phone]  Fax [phone]

## 2016-11-30 ENCOUNTER — Other Ambulatory Visit: Payer: Self-pay | Admitting: Family Medicine

## 2016-12-01 ENCOUNTER — Ambulatory Visit: Payer: Medicare Other | Admitting: Family Medicine

## 2016-12-01 ENCOUNTER — Ambulatory Visit: Payer: Self-pay

## 2016-12-09 ENCOUNTER — Ambulatory Visit: Payer: Self-pay

## 2016-12-10 ENCOUNTER — Encounter: Payer: Self-pay | Admitting: Family Medicine

## 2016-12-10 ENCOUNTER — Ambulatory Visit (INDEPENDENT_AMBULATORY_CARE_PROVIDER_SITE_OTHER): Payer: Medicare Other | Admitting: Family Medicine

## 2016-12-10 ENCOUNTER — Ambulatory Visit: Payer: Self-pay

## 2016-12-10 VITALS — BP 138/76 | HR 74 | Temp 98.8°F | Resp 16 | Wt 186.0 lb

## 2016-12-10 DIAGNOSIS — E1121 Type 2 diabetes mellitus with diabetic nephropathy: Secondary | ICD-10-CM

## 2016-12-10 DIAGNOSIS — I4891 Unspecified atrial fibrillation: Secondary | ICD-10-CM | POA: Diagnosis not present

## 2016-12-10 DIAGNOSIS — I10 Essential (primary) hypertension: Secondary | ICD-10-CM | POA: Diagnosis not present

## 2016-12-10 DIAGNOSIS — D1722 Benign lipomatous neoplasm of skin and subcutaneous tissue of left arm: Secondary | ICD-10-CM | POA: Diagnosis not present

## 2016-12-10 DIAGNOSIS — E039 Hypothyroidism, unspecified: Secondary | ICD-10-CM

## 2016-12-10 LAB — POCT GLYCOSYLATED HEMOGLOBIN (HGB A1C)
Est. average glucose Bld gHb Est-mCnc: 157
Hemoglobin A1C: 7.1

## 2016-12-10 LAB — POCT INR
INR: 2.5
PT: 29.9

## 2016-12-10 NOTE — Progress Notes (Signed)
Patient: Dawn Burnett Female    DOB: [birth year]   83 y.o.   MRN: [id number] Visit Date: 12/10/2016  Today's Provider: [name], MD   Chief Complaint  Patient presents with  . Diabetes  . Hypertension  . Hypothyroidism  . Atrial Fibrillation   Subjective:    HPI  Diabetes Mellitus Type II, Follow-up:   Lab Results  Component Value Date   HGBA1C 7.1 12/10/2016   HGBA1C 7.1 09/01/2016   HGBA1C 7.3 04/28/2016    Last seen for diabetes 4 months ago.  Management since then includes no changes. She reports good compliance with treatment. She is not having side effects.  Current symptoms include none and have been stable. Home blood sugar records: not being checked.  Episodes of hypoglycemia? no   Current Insulin Regimen: none  Most Recent Eye Exam: due Weight trend: stable Prior visit with dietician: no Current diet: well balanced Current exercise: none  Pertinent Labs:    Component Value Date/Time   CHOL 184 12/25/2015 0826   CHOL 319 (H) 01/30/2013 0052   TRIG 350 (H) 12/25/2015 0826   TRIG 678 (H) 01/30/2013 0052   HDL 44 12/25/2015 0826   HDL 42 01/30/2013 0052   LDLCALC 70 12/25/2015 0826   LDLCALC SEE COMMENT 01/30/2013 0052   CREATININE 0.94 08/03/2016 0937   CREATININE 1.07 07/10/2014 1022    Wt Readings from Last 3 Encounters:  12/10/16 186 lb (84.4 kg)  08/03/16 184 lb (83.5 kg)  07/31/16 187 lb 11.6 oz (85.2 kg)      Hypertension, follow-up:  BP Readings from Last 3 Encounters:  12/10/16 138/76  08/12/16 124/60  08/03/16 (!) 147/84    She was last seen for hypertension 4 months ago.  BP at that visit was 124/60. Management since that visit includes no changes. She reports good compliance with treatment. She is not having side effects.  She is not exercising. She is adherent to low salt diet.   Outside blood pressures are not being checked. She is experiencing none.  Patient denies exertional chest  pressure/discomfort.   Cardiovascular risk factors include diabetes mellitus.      Weight trend: stable Wt Readings from Last 3 Encounters:  12/10/16 186 lb (84.4 kg)  08/03/16 184 lb (83.5 kg)  07/31/16 187 lb 11.6 oz (85.2 kg)    Current diet: well balanced    Hypothyroidism, follow up: Patient was last seen in the office 4 months ago. No changes were made in her medications since last OV. Patient is currently taking Synthroid 51mcg daily, and reports good symptom control.  Lab Results  Component Value Date   TSH 1.630 12/25/2015    A-fib follow up: Patient was last seen in the office 4 weeks ago. No changes were made in her medications since last OV. Patient is currently taking Coumadin 5mg  on MWF, and 4mg  on all the other days. Patient reports good symptom control.     Lab Results  Component Value Date   INR 2.5 12/10/2016   INR 2.7 11/11/2016   INR 3.0 10/14/2016   PROTIME 28.3 03/20/2015   PROTIME 2.7 (A) 11/27/2014    Allergies  Allergen Reactions  . Iodinated Diagnostic Agents Rash  . Augmentin [Amoxicillin-Pot Clavulanate] Other (See Comments)    Gi distress   . Codeine   . Crestor [Rosuvastatin] Other (See Comments)    Unknown Unknown  . Levaquin [Levofloxacin In D5w]     Stomach pain, body  pain/ache all over Stomach pain, body pain/ache all over  . Naproxen Other (See Comments)    unknown unknown  . Prednisone   . Sulfa Antibiotics   . Sulfacetamide Sodium   . Tolectin [Tolmetin]      Current Outpatient Prescriptions:  .  acetaminophen (TYLENOL) 500 MG tablet, Take 500 mg by mouth every 6 (six) hours as needed for mild pain, moderate pain, fever or headache., Disp: , Rfl:  .  atorvastatin (LIPITOR) 20 MG tablet, Take 20 mg by mouth at bedtime. , Disp: , Rfl:  .  Calcium-Vitamin D 600-200 MG-UNIT per tablet, Take 600 tablets by mouth daily with supper. , Disp: , Rfl:  .  celecoxib (CELEBREX) 200 MG capsule, TAKE 1 CAPSULE (200 MG TOTAL) BY MOUTH  DAILY., Disp: 30 capsule, Rfl: 1 .  COUMADIN 1 MG tablet, TAKE 1 (ONE) TABLET TABLET, ORAL, DAILY AS DIRECTED, Disp: 30 tablet, Rfl: 11 .  COUMADIN 4 MG tablet, TAKE 1 TABLET AS DIRECTED, Disp: 30 tablet, Rfl: 11 .  diltiazem (CARDIZEM CD) 120 MG 24 hr capsule, Take 120 mg by mouth daily., Disp: , Rfl:  .  ENTRESTO 24-26 MG, Take 1 tablet by mouth 2 (two) times daily. , Disp: , Rfl:  .  fluticasone (FLONASE) 50 MCG/ACT nasal spray, Place 2 sprays into both nostrils daily., Disp: 16 g, Rfl: 6 .  furosemide (LASIX) 20 MG tablet, Take 20 mg by mouth daily. , Disp: , Rfl:  .  gabapentin (NEURONTIN) 600 MG tablet, Take 1 tablet (600 mg total) by mouth 3 (three) times daily., Disp: 90 tablet, Rfl: 11 .  imipramine (TOFRANIL) 25 MG tablet, Take 25 mg by mouth at bedtime. , Disp: , Rfl:  .  metoprolol (LOPRESSOR) 100 MG tablet, Take 100 mg by mouth 2 (two) times daily. , Disp: , Rfl:  .  Multiple Vitamins-Minerals (CENTRUM SILVER ULTRA WOMENS PO), Take 1 tablet by mouth daily with lunch. , Disp: , Rfl:  .  nystatin cream (MYCOSTATIN), APPLY  TOPICALLY 2 (TWO) TIMES DAILY. APPLY TO AFFECTED AREA, Disp: 30 g, Rfl: 1 .  omeprazole (PRILOSEC) 20 MG capsule, TAKE ONE CAPSULE DAILY, Disp: 30 capsule, Rfl: 11 .  potassium chloride (K-DUR) 10 MEQ tablet, Take 10 mEq by mouth daily. Reported on 12/24/2015, Disp: , Rfl:  .  ranitidine (ZANTAC) 150 MG tablet, TAKE 1 TABLET BY MOUTH TWO TIMES DAILY, Disp: 60 tablet, Rfl: 11 .  SYNTHROID 50 MCG tablet, TAKE 1 TABLET (50 MCG TOTAL) BY MOUTH DAILY BEFORE BREAKFAST., Disp: 90 tablet, Rfl: 0  Review of Systems  Constitutional: Negative.   Eyes: Negative.   Respiratory: Negative.   Cardiovascular: Negative for chest pain, palpitations and leg swelling.  Endocrine: Negative.   Musculoskeletal: Negative.   Allergic/Immunologic: Negative.   Neurological: Negative.   Psychiatric/Behavioral: Negative.     Social History  Substance Use Topics  . Smoking status: Never  Smoker  . Smokeless tobacco: Never Used  . Alcohol use No   Objective:   BP 138/76 (BP Location: Right Arm, Patient Position: Sitting, Cuff Size: Normal)   Pulse 74   Temp 98.8 F (37.1 C)   Resp 16   Wt 186 lb (84.4 kg)   SpO2 98%   BMI 29.13 kg/m  Vitals:   12/10/16 1346  BP: 138/76  Pulse: 74  Resp: 16  Temp: 98.8 F (37.1 C)  SpO2: 98%  Weight: 186 lb (84.4 kg)     Physical Exam  Constitutional: She is oriented  to person, place, and time. She appears well-developed and well-nourished.  HENT:  Head: Normocephalic and atraumatic.  Eyes: Conjunctivae are normal.  Neck: Neck supple. No thyromegaly present.  Cardiovascular: Normal rate, regular rhythm and normal heart sounds.   Pulmonary/Chest: Effort normal and breath sounds normal.  Abdominal: Soft.  Musculoskeletal: She exhibits no edema.  Neurological: She is alert and oriented to person, place, and time.  Skin: Skin is warm and dry.  Psychiatric: She has a normal mood and affect. Her behavior is normal. Judgment and thought content normal.        Assessment & Plan:     1. Atrial fibrillation, controlled ([place])  - POCT INR  2. Type 2 diabetes mellitus with diabetic nephropathy, without long-term current use of insulin ([facility])  - POCT glycosylated hemoglobin (Hb A1C) - CBC with Differential/Platelet  3. Adult hypothyroidism  - TSH  4. Essential (primary) hypertension  - Comprehensive metabolic panel - Lipid panel  5. Lipoma of left upper extremity      I have done the exam and reviewed the chart and it is accurate to the best of my knowledge. Development worker, community has been used and  any errors in dictation or transcription are unintentional. Miguel Aschoff M.D. [place], MD  [facility]

## 2016-12-10 NOTE — Patient Instructions (Signed)
Apply heating pad on left arm to help with the pain.

## 2016-12-15 LAB — CBC WITH DIFFERENTIAL/PLATELET
Basophils Absolute: 0 10*3/uL (ref 0.0–0.2)
Basos: 0 %
EOS (ABSOLUTE): 0.1 10*3/uL (ref 0.0–0.4)
Eos: 1 %
Hematocrit: 42.2 % (ref 34.0–46.6)
Hemoglobin: 14.5 g/dL (ref 11.1–15.9)
Immature Grans (Abs): 0 10*3/uL (ref 0.0–0.1)
Immature Granulocytes: 0 %
Lymphocytes Absolute: 1.8 10*3/uL (ref 0.7–3.1)
Lymphs: 29 %
MCH: 34.2 pg — ABNORMAL HIGH (ref 26.6–33.0)
MCHC: 34.4 g/dL (ref 31.5–35.7)
MCV: 100 fL — ABNORMAL HIGH (ref 79–97)
Monocytes Absolute: 0.4 10*3/uL (ref 0.1–0.9)
Monocytes: 7 %
Neutrophils Absolute: 3.9 10*3/uL (ref 1.4–7.0)
Neutrophils: 63 %
Platelets: 135 10*3/uL — ABNORMAL LOW (ref 150–379)
RBC: 4.24 x10E6/uL (ref 3.77–5.28)
RDW: 14.5 % (ref 12.3–15.4)
WBC: 6.2 10*3/uL (ref 3.4–10.8)

## 2016-12-15 LAB — LIPID PANEL
Chol/HDL Ratio: 4 ratio (ref 0.0–4.4)
Cholesterol, Total: 174 mg/dL (ref 100–199)
HDL: 44 mg/dL (ref >39)
LDL Calculated: 77 mg/dL (ref 0–99)
Triglycerides: 263 mg/dL — ABNORMAL HIGH (ref 0–149)
VLDL Cholesterol Cal: 53 mg/dL — ABNORMAL HIGH (ref 5–40)

## 2016-12-15 LAB — COMPREHENSIVE METABOLIC PANEL
ALT: 12 IU/L (ref 0–32)
AST: 16 IU/L (ref 0–40)
Albumin/Globulin Ratio: 1.9 (ref 1.2–2.2)
Albumin: 4.2 g/dL (ref 3.5–4.7)
Alkaline Phosphatase: 53 IU/L (ref 39–117)
BUN/Creatinine Ratio: 19 (ref 12–28)
BUN: 18 mg/dL (ref 8–27)
Bilirubin Total: 0.8 mg/dL (ref 0.0–1.2)
CO2: 27 mmol/L (ref 18–29)
Calcium: 9.6 mg/dL (ref 8.7–10.3)
Chloride: 103 mmol/L (ref 96–106)
Creatinine, Ser: 0.97 mg/dL (ref 0.57–1.00)
GFR calc Af Amer: 61 mL/min/{1.73_m2} (ref >59)
GFR calc non Af Amer: 53 mL/min/{1.73_m2} — ABNORMAL LOW (ref >59)
Globulin, Total: 2.2 g/dL (ref 1.5–4.5)
Glucose: 160 mg/dL — ABNORMAL HIGH (ref 65–99)
Potassium: 4.5 mmol/L (ref 3.5–5.2)
Sodium: 145 mmol/L — ABNORMAL HIGH (ref 134–144)
Total Protein: 6.4 g/dL (ref 6.0–8.5)

## 2016-12-15 LAB — TSH: TSH: 1.48 u[IU]/mL (ref 0.450–4.500)

## 2016-12-17 ENCOUNTER — Ambulatory Visit (INDEPENDENT_AMBULATORY_CARE_PROVIDER_SITE_OTHER): Payer: Medicare Other

## 2016-12-17 VITALS — BP 132/62 | HR 74 | Temp 99.2°F | Resp 17 | Ht 67.0 in | Wt 185.0 lb

## 2016-12-17 DIAGNOSIS — Z23 Encounter for immunization: Secondary | ICD-10-CM | POA: Diagnosis not present

## 2016-12-17 DIAGNOSIS — Z0000 Encounter for general adult medical examination without abnormal findings: Secondary | ICD-10-CM

## 2016-12-17 NOTE — Progress Notes (Signed)
Subjective:   Dawn Burnett is a 83 y.o. female who presents for Medicare Annual (Subsequent) preventive examination.  Review of Systems:  N/A Cardiac Risk Factors include: advanced age (>52men, >12 women);hypertension;diabetes mellitus     Objective:     Vitals: BP 132/62 (BP Location: Right Arm, Patient Position: Sitting)   Pulse 74   Temp 99.2 F (37.3 C)   Resp 17   Ht 5\' 7"  (1.702 m)   Wt 185 lb (83.9 kg)   BMI 28.98 kg/m   Body mass index is 28.98 kg/m.   Tobacco History  Smoking Status  . Never Smoker  Smokeless Tobacco  . Never Used     Counseling given: Not Answered   Past Medical History:  Diagnosis Date  . Arthritis   . Atrial fibrillation ([place])   . CHF (congestive heart failure) ([place])   . GERD (gastroesophageal reflux disease)   . Hiatal hernia   . Hypertension   . Skin cancer, basal cell   . Thyroid disease    Past Surgical History:  Procedure Laterality Date  . ABDOMINAL HYSTERECTOMY    . APPENDECTOMY    . BREAST SURGERY    . cateract extraction    . EYE SURGERY     done by Dr. Vickki Muff  . KNEE SURGERY    . 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 right hip surgery    . right total knee replacement     Family History  Problem Relation Age of Onset  . Heart disease Mother   . Hypertension Mother   . Heart attack Mother   . Stomach cancer Father   . Liver cancer Brother   . Hypertension Son   . Huntington's disease Sister   . Diabetes Sister   . Kidney disease Sister   . Congestive Heart Failure Sister   . Pneumonia Sister   . Stroke Brother   . Parkinson's disease Brother   . Hypertension Brother   . Congestive Heart Failure Brother    History  Sexual Activity  . Sexual activity: No    Outpatient Encounter Prescriptions as of 12/17/2016  Medication Sig  . acetaminophen (TYLENOL) 500 MG tablet Take 500 mg by mouth every 6 (six) hours as needed for mild pain, moderate pain, fever or headache.  Marland Kitchen  atorvastatin (LIPITOR) 20 MG tablet Take 20 mg by mouth at bedtime.   . Calcium-Vitamin D 600-200 MG-UNIT per tablet Take 600 tablets by mouth daily with supper.   . celecoxib (CELEBREX) 200 MG capsule TAKE 1 CAPSULE (200 MG TOTAL) BY MOUTH DAILY.  Marland Kitchen COUMADIN 1 MG tablet TAKE 1 (ONE) TABLET TABLET, ORAL, DAILY AS DIRECTED  . COUMADIN 4 MG tablet TAKE 1 TABLET AS DIRECTED  . diltiazem (CARDIZEM CD) 120 MG 24 hr capsule Take 120 mg by mouth daily.  Marland Kitchen ENTRESTO 24-26 MG Take 1 tablet by mouth 2 (two) times daily.   . fluticasone (FLONASE) 50 MCG/ACT nasal spray Place 2 sprays into both nostrils daily.  . furosemide (LASIX) 20 MG tablet Take 20 mg by mouth daily.   Marland Kitchen gabapentin (NEURONTIN) 600 MG tablet Take 1 tablet (600 mg total) by mouth 3 (three) times daily.  . metoprolol (LOPRESSOR) 100 MG tablet Take 100 mg by mouth 2 (two) times daily.   . mirabegron ER (MYRBETRIQ) 50 MG TB24 tablet Take 50 mg by mouth.  . Multiple Vitamins-Minerals (CENTRUM SILVER ULTRA WOMENS PO) Take 1 tablet by mouth daily with  lunch.   . nystatin cream (MYCOSTATIN) APPLY  TOPICALLY 2 (TWO) TIMES DAILY. APPLY TO AFFECTED AREA (Patient taking differently: as needed. APPLY  TOPICALLY 2 (TWO) TIMES DAILY. APPLY TO AFFECTED AREA)  . omeprazole (PRILOSEC) 20 MG capsule TAKE ONE CAPSULE DAILY  . ranitidine (ZANTAC) 150 MG tablet TAKE 1 TABLET BY MOUTH TWO TIMES DAILY  . SYNTHROID 50 MCG tablet TAKE 1 TABLET (50 MCG TOTAL) BY MOUTH DAILY BEFORE BREAKFAST.  Marland Kitchen potassium chloride (K-DUR) 10 MEQ tablet Take 10 mEq by mouth daily. Reported on 12/24/2015  . [DISCONTINUED] imipramine (TOFRANIL) 25 MG tablet Take 25 mg by mouth at bedtime.    No facility-administered encounter medications on file as of 12/17/2016.     Activities of Daily Living In your present state of health, do you have any difficulty performing the following activities: 12/17/2016 12/24/2015  Hearing? Tempie Donning  Vision? N N  Difficulty concentrating or making decisions?  N N  Walking or climbing stairs? Y Y  Dressing or bathing? N Y  Doing errands, shopping? Tempie Donning  Preparing Food and eating ? N -  Using the Toilet? N -  In the past six months, have you accidently leaked urine? Y -  Do you have problems with loss of bowel control? N -  Managing your Medications? N -  Managing your Finances? N -  Housekeeping or managing your Housekeeping? N -  Some recent data might be hidden    Patient Care Team: Jerrol Banana., MD as PCP - General (Family Medicine) Margarita Rana, MD as Referring Physician (Family Medicine) Christene Lye, MD (General Surgery)    Assessment:     Exercise Activities and Dietary recommendations Current Exercise Habits: The patient does not participate in regular exercise at present  Goals    . Increase water intake          Recommend drinking 3-4 glasses of water a day.       Fall Risk Fall Risk  12/17/2016 12/24/2015 11/21/2015 09/30/2015 09/30/2015  Falls in the past year? Yes Yes Yes Yes Yes  Number falls in past yr: 2 or more 1 1 1 1   Injury with Fall? No Yes No No No  Risk Factor Category  High Fall Risk - - - -  Risk for fall due to : - - - Impaired balance/gait -  Follow up Falls prevention discussed Falls evaluation completed - Education provided -   Depression Screen PHQ 2/9 Scores 12/17/2016 12/17/2016 12/24/2015 11/21/2015  PHQ - 2 Score 0 0 0 0  PHQ- 9 Score 2 - - -     Cognitive Function     6CIT Screen 12/17/2016  What Year? 0 points  What month? 0 points  What time? 0 points  Count back from 20 0 points  Months in reverse 0 points  Repeat phrase 2 points  Total Score 2    Immunization History  Administered Date(s) Administered  . Influenza, High Dose Seasonal PF 04/28/2016  . Influenza,inj,Quad PF,36+ Mos 05/22/2015  . Pneumococcal Conjugate-13 07/10/2015  . Pneumococcal Polysaccharide-23 12/17/2016  . Td 06/20/2004   Screening Tests Health Maintenance  Topic Date Due  . PNA vac  Low Risk Adult (2 of 2 - PPSV23) 07/09/2016  . FOOT EXAM  01/31/2017 (Originally 04/19/2015)  . URINE MICROALBUMIN  04/19/2017 (Originally 01/21/2016)  . TETANUS/TDAP  12/17/2017 (Originally 06/20/2014)  . INFLUENZA VACCINE  03/10/2017  . HEMOGLOBIN A1C  06/12/2017  . OPHTHALMOLOGY EXAM  10/01/2017  .  DEXA SCAN  Completed      Plan:     I have personally reviewed and addressed the Medicare Annual Wellness questionnaire and have noted the following in the patient's chart:  A. Medical and social history B. Use of alcohol, tobacco or illicit drugs  C. Current medications and supplements D. Functional ability and status E.  Nutritional status F.  Physical activity G. Advance directives H. List of other physicians I.  Hospitalizations, surgeries, and ER visits in previous 12 months J.  [place] such as hearing and vision if needed, cognitive and depression L. Referrals and appointments - none  In addition, I have reviewed and discussed with patient certain preventive protocols, quality metrics, and best practice recommendations. A written personalized care plan for preventive services as well as general preventive health recommendations were provided to patient.  See attached scanned questionnaire for additional information.   Signed,  Tyler Aas, LPN Nurse Health Advisor   MD Recommendations: Patient has UTI symptoms- burning, frequent urination-denied visit today, pt  will call next week if symptoms persists. I have reviewed the health advisors note, was  available for consultation and I agree with documentation and plan. Miguel Aschoff MD [facility]

## 2016-12-17 NOTE — Patient Instructions (Addendum)
Dawn Burnett , Thank you for taking time to come for your Medicare Wellness Visit. I appreciate your ongoing commitment to your health goals. Please review the following plan we discussed and let me know if I can assist you in the future.   Screening recommendations/referrals: Colonoscopy: completed 05/04/2016, no longer required Mammogram: completed 03/27/2013, no longer required Bone Density: due now Recommended yearly ophthalmology/optometry visit for glaucoma screening and checkup Recommended yearly dental visit for hygiene and checkup  Vaccinations: Influenza vaccine: completed 04/28/2016, due 04/2017 Pneumococcal vaccine: prevnar 13 done 07/10/2015, prevnar 23 due Tdap vaccine: done 06/2004, due now- declined Shingles vaccine: due now, check with your insurance company to see if this is covered.   Advanced directives: Advance directive discussed with you today. You have declined the information on this today, please let us know if you decide to completed this  Conditions/risks identified: Recommend drinking 3-4 glasses of water a day.   Next appointment: 04/20/2017 at 11:00am with Dr. Rosanna Randy, follow up in one year for your annual wellness visit   Preventive Care 65 Years and Older, Female Preventive care refers to lifestyle choices and visits with your health care provider that can promote health and wellness. What does preventive care include?  A yearly physical exam. This is also called an annual well check.  Dental exams once or twice a year.  Routine eye exams. Ask your health care provider how often you should have your eyes checked.  Personal lifestyle choices, including:  Daily care of your teeth and gums.  Regular physical activity.  Eating a healthy diet.  Avoiding tobacco and drug use.  Limiting alcohol use.  Practicing safe sex.  Taking low-dose aspirin every day.  Taking vitamin and mineral supplements as recommended by your health care provider. What  happens during an annual well check? The services and screenings done by your health care provider during your annual well check will depend on your age, overall health, lifestyle risk factors, and family history of disease. Counseling  Your health care provider may ask you questions about your:  Alcohol use.  Tobacco use.  Drug use.  Emotional well-being.  Home and relationship well-being.  Sexual activity.  Eating habits.  History of falls.  Memory and ability to understand (cognition).  Work and work Statistician.  Reproductive health. Screening  You may have the following tests or measurements:  Height, weight, and BMI.  Blood pressure.  Lipid and cholesterol levels. These may be checked every 5 years, or more frequently if you are over 42 years old.  Skin check.  Lung cancer screening. You may have this screening every year starting at age 28 if you have a 30-pack-year history of smoking and currently smoke or have quit within the past 15 years.  Fecal occult blood test (FOBT) of the stool. You may have this test every year starting at age 76.  Flexible sigmoidoscopy or colonoscopy. You may have a sigmoidoscopy every 5 years or a colonoscopy every 10 years starting at age 64.  Hepatitis C blood test.  Hepatitis B blood test.  Sexually transmitted disease (STD) testing.  Diabetes screening. This is done by checking your blood sugar (glucose) after you have not eaten for a while (fasting). You may have this done every 1-3 years.  Bone density scan. This is done to screen for osteoporosis. You may have this done starting at age 42.  Mammogram. This may be done every 1-2 years. Talk to your health care provider about how often you  should have regular mammograms. Talk with your health care provider about your test results, treatment options, and if necessary, the need for more tests. Vaccines  Your health care provider may recommend certain vaccines, such  as:  Influenza vaccine. This is recommended every year.  Tetanus, diphtheria, and acellular pertussis (Tdap, Td) vaccine. You may need a Td booster every 10 years.  Zoster vaccine. You may need this after age 38.  Pneumococcal 13-valent conjugate (PCV13) vaccine. One dose is recommended after age 2.  Pneumococcal polysaccharide (PPSV23) vaccine. One dose is recommended after age 63. Talk to your health care provider about which screenings and vaccines you need and how often you need them. This information is not intended to replace advice given to you by your health care provider. Make sure you discuss any questions you have with your health care provider. Document Released: 08/23/2015 Document Revised: 04/15/2016 Document Reviewed: 05/28/2015 Elsevier Interactive Patient Education  2017 [employer] Prevention in the Home Falls can cause injuries. They can happen to people of all ages. There are many things you can do to make your home safe and to help prevent falls. What can I do on the outside of my home?  Regularly fix the edges of walkways and driveways and fix any cracks.  Remove anything that might make you trip as you walk through a door, such as a raised step or threshold.  Trim any bushes or trees on the path to your home.  Use bright outdoor lighting.  Clear any walking paths of anything that might make someone trip, such as rocks or tools.  Regularly check to see if handrails are loose or broken. Make sure that both sides of any steps have handrails.  Any raised decks and porches should have guardrails on the edges.  Have any leaves, snow, or ice cleared regularly.  Use sand or salt on walking paths during winter.  Clean up any spills in your garage right away. This includes oil or grease spills. What can I do in the bathroom?  Use night lights.  Install grab bars by the toilet and in the tub and shower. Do not use towel bars as grab bars.  Use  non-skid mats or decals in the tub or shower.  If you need to sit down in the shower, use a plastic, non-slip stool.  Keep the floor dry. Clean up any water that spills on the floor as soon as it happens.  Remove soap buildup in the tub or shower regularly.  Attach bath mats securely with double-sided non-slip rug tape.  Do not have throw rugs and other things on the floor that can make you trip. What can I do in the bedroom?  Use night lights.  Make sure that you have a light by your bed that is easy to reach.  Do not use any sheets or blankets that are too big for your bed. They should not hang down onto the floor.  Have a firm chair that has side arms. You can use this for support while you get dressed.  Do not have throw rugs and other things on the floor that can make you trip. What can I do in the kitchen?  Clean up any spills right away.  Avoid walking on wet floors.  Keep items that you use a lot in easy-to-reach places.  If you need to reach something above you, use a strong step stool that has a grab bar.  Keep electrical cords out  of the way.  Do not use floor polish or wax that makes floors slippery. If you must use wax, use non-skid floor wax.  Do not have throw rugs and other things on the floor that can make you trip. What can I do with my stairs?  Do not leave any items on the stairs.  Make sure that there are handrails on both sides of the stairs and use them. Fix handrails that are broken or loose. Make sure that handrails are as long as the stairways.  Check any carpeting to make sure that it is firmly attached to the stairs. Fix any carpet that is loose or worn.  Avoid having throw rugs at the top or bottom of the stairs. If you do have throw rugs, attach them to the floor with carpet tape.  Make sure that you have a light switch at the top of the stairs and the bottom of the stairs. If you do not have them, ask someone to add them for you. What  else can I do to help prevent falls?  Wear shoes that:  Do not have high heels.  Have rubber bottoms.  Are comfortable and fit you well.  Are closed at the toe. Do not wear sandals.  If you use a stepladder:  Make sure that it is fully opened. Do not climb a closed stepladder.  Make sure that both sides of the stepladder are locked into place.  Ask someone to hold it for you, if possible.  Clearly mark and make sure that you can see:  Any grab bars or handrails.  First and last steps.  Where the edge of each step is.  Use tools that help you move around (mobility aids) if they are needed. These include:  Canes.  Walkers.  Scooters.  Crutches.  Turn on the lights when you go into a dark area. Replace any light bulbs as soon as they burn out.  Set up your furniture so you have a clear path. Avoid moving your furniture around.  If any of your floors are uneven, fix them.  If there are any pets around you, be aware of where they are.  Review your medicines with your doctor. Some medicines can make you feel dizzy. This can increase your chance of falling. Ask your doctor what other things that you can do to help prevent falls. This information is not intended to replace advice given to you by your health care provider. Make sure you discuss any questions you have with your health care provider. Document Released: 05/23/2009 Document Revised: 01/02/2016 Document Reviewed: 08/31/2014 Elsevier Interactive Patient Education  2017 [employer].

## 2017-01-06 ENCOUNTER — Ambulatory Visit (INDEPENDENT_AMBULATORY_CARE_PROVIDER_SITE_OTHER): Payer: Medicare Other

## 2017-01-06 DIAGNOSIS — I4891 Unspecified atrial fibrillation: Secondary | ICD-10-CM | POA: Diagnosis not present

## 2017-01-06 LAB — POCT INR
INR: 3.6
PT: 43.2

## 2017-01-06 NOTE — Patient Instructions (Signed)
Skip one day, then 4mg  daily.  Recheck in one week.

## 2017-01-13 ENCOUNTER — Other Ambulatory Visit: Payer: Self-pay | Admitting: Family Medicine

## 2017-01-13 ENCOUNTER — Ambulatory Visit (INDEPENDENT_AMBULATORY_CARE_PROVIDER_SITE_OTHER): Payer: Medicare Other

## 2017-01-13 DIAGNOSIS — I4891 Unspecified atrial fibrillation: Secondary | ICD-10-CM

## 2017-01-13 LAB — POCT INR
INR: 2.7
PT: 32.3

## 2017-01-13 NOTE — Progress Notes (Signed)
Anticoagulation Warfarin Dose Instructions as of 01/13/2017      Dawn Burnett Tue Wed Thu Fri Sat   New Dose 4 mg 4 mg 4 mg 4 mg 4 mg 4 mg 4 mg    Description   Take 4mg  daily.  Recheck in 2 weeks.

## 2017-01-15 ENCOUNTER — Ambulatory Visit: Payer: Medicare Other

## 2017-01-18 NOTE — Telephone Encounter (Signed)
Pt contacted office for refill request on the following medications:  celecoxib (CELEBREX) 200 MG capsule.  Kristopher Oppenheim.  ES#[phone]

## 2017-01-24 IMAGING — CR DG FOREARM 2V*L*
1 series · 2 of 2 positions shown · non-contrast
Comparison: None.

CLINICAL DATA: Left forearm hematoma.

EXAM:
LEFT FOREARM - 2 VIEW

[Series 1: dg forearm left · 0.14mm/px · 2 of 2 slices shown]
[im 1/2]
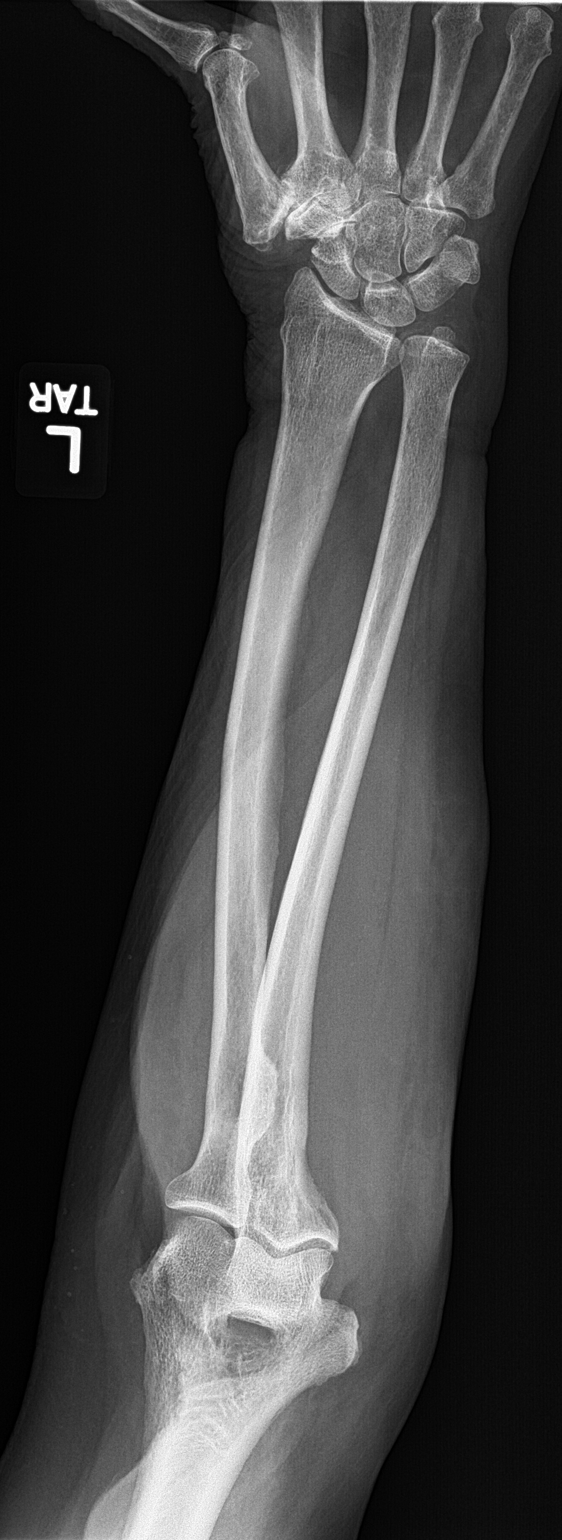
[im 2/2]
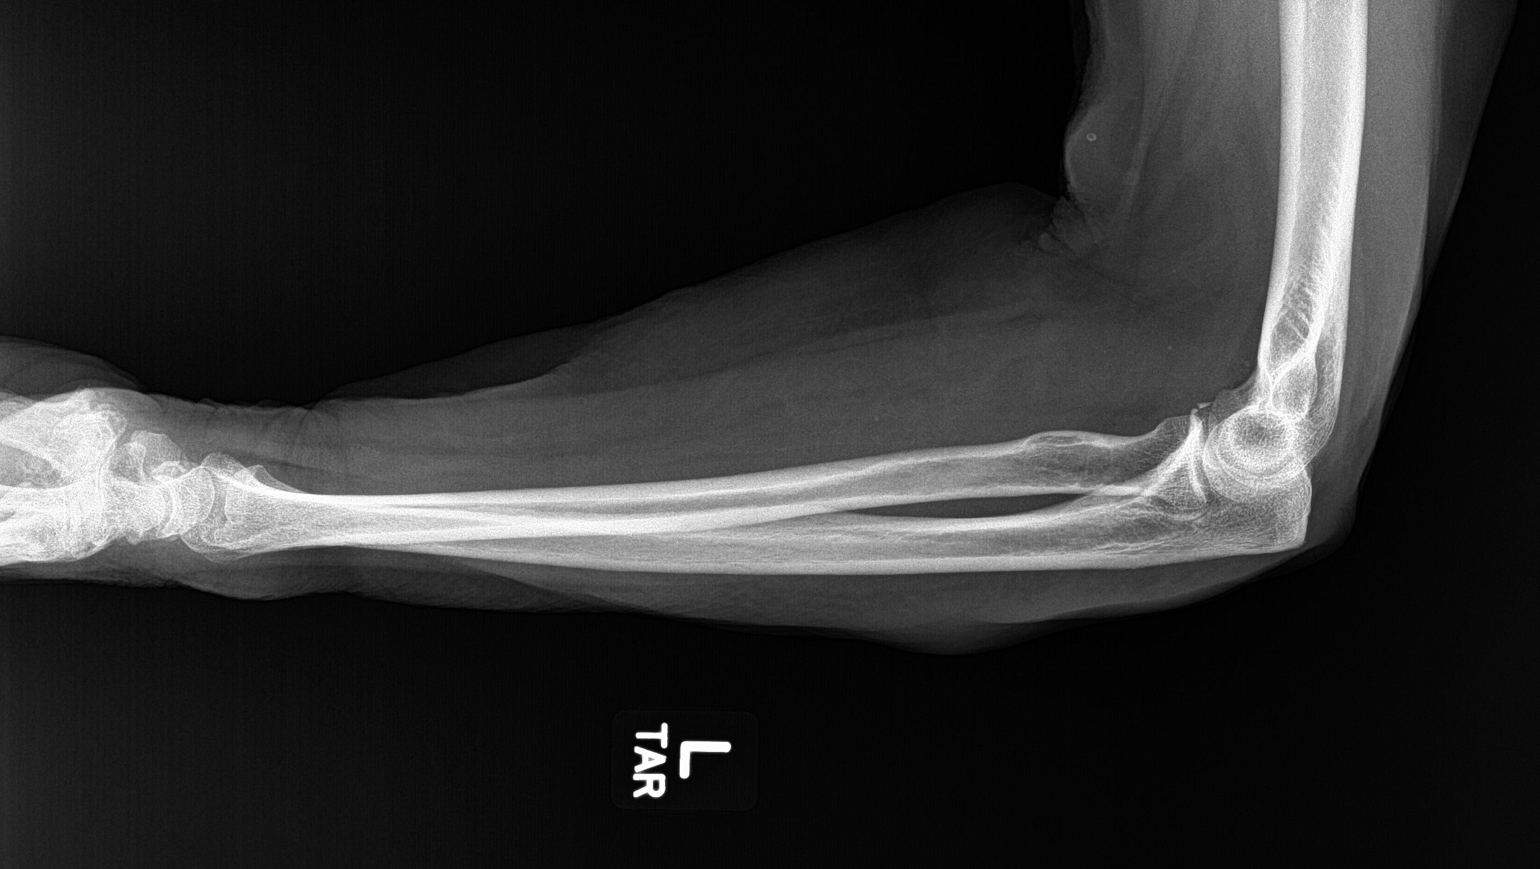

[2 of 2 positions shown; findings below may reference images not displayed]

FINDINGS: There is no evidence of fracture or other focal bone lesions. No
radiopaque foreign body is noted.
IMPRESSION: Normal left forearm.

## 2017-01-27 ENCOUNTER — Ambulatory Visit: Payer: Medicare Other

## 2017-02-01 ENCOUNTER — Ambulatory Visit (INDEPENDENT_AMBULATORY_CARE_PROVIDER_SITE_OTHER): Payer: Medicare Other | Admitting: Podiatry

## 2017-02-01 ENCOUNTER — Encounter: Payer: Self-pay | Admitting: Podiatry

## 2017-02-01 DIAGNOSIS — M79676 Pain in unspecified toe(s): Secondary | ICD-10-CM

## 2017-02-01 DIAGNOSIS — B351 Tinea unguium: Secondary | ICD-10-CM

## 2017-02-01 DIAGNOSIS — M2011 Hallux valgus (acquired), right foot: Secondary | ICD-10-CM

## 2017-02-01 NOTE — Progress Notes (Signed)
Complaint:  Visit Type: Patient returns to my office for continued preventative foot care services. Complaint: Patient states" my nails have grown long and thick and become painful to walk and wear shoes" Patient has been diagnosed with hyperglycemia with neuropathy.. The patient presents for preventative foot care services. No changes to ROS  Podiatric Exam: Vascular: dorsalis pedis and posterior tibial pulses are palpable bilateral. Capillary return is immediate. Temperature gradient is WNL. Skin turgor WNL  Dorsal skin lesions due to vascular. Sensorium: Normal Semmes Weinstein monofilament test. Normal tactile sensation bilaterally. Nail Exam: Pt has thick disfigured discolored nails with subungual debris noted bilateral entire nail hallux through fifth toenails Ulcer Exam: There is no evidence of ulcer or pre-ulcerative changes or infection. Orthopedic Exam: Muscle tone and strength are WNL. No limitations in general ROM. No crepitus or effusions noted. Foot type and digits show no abnormalities.  HAV  B/L Skin: No Porokeratosis. No infection or ulcers  Diagnosis:  Onychomycosis, , Pain in right toe, pain in left toes  Treatment & Plan Procedures and Treatment: Consent by patient was obtained for treatment procedures. The patient understood the discussion of treatment and procedures well. All questions were answered thoroughly reviewed. Debridement of mycotic and hypertrophic toenails, 1 through 5 bilateral and clearing of subungual debris. No ulceration, no infection noted.  Return Visit-Office Procedure: Patient instructed to return to the office for a follow up visit 3 months for continued evaluation and treatment.    Amery Minasyan DPM 

## 2017-02-03 ENCOUNTER — Ambulatory Visit (INDEPENDENT_AMBULATORY_CARE_PROVIDER_SITE_OTHER): Payer: Medicare Other

## 2017-02-03 ENCOUNTER — Ambulatory Visit: Payer: Medicare Other

## 2017-02-03 DIAGNOSIS — I4891 Unspecified atrial fibrillation: Secondary | ICD-10-CM | POA: Diagnosis not present

## 2017-02-03 LAB — POCT INR
INR: 2.6
PT: 31.7

## 2017-02-03 NOTE — Patient Instructions (Signed)
Anticoagulation Warfarin Dose Instructions as of 02/03/2017      Dorene Grebe Tue Wed Thu Fri Sat   New Dose 4 mg 4 mg 4 mg 4 mg 4 mg 4 mg 4 mg    Description    Dx: Atrial Fibrillation Current coumadin dose: 4mg  daily PT:31.7 INR: 2.6 Today's changes: NO CHANGE Recheck:  4 weeks

## 2017-02-14 ENCOUNTER — Other Ambulatory Visit: Payer: Self-pay | Admitting: Family Medicine

## 2017-02-14 DIAGNOSIS — E039 Hypothyroidism, unspecified: Secondary | ICD-10-CM

## 2017-03-03 ENCOUNTER — Ambulatory Visit (INDEPENDENT_AMBULATORY_CARE_PROVIDER_SITE_OTHER): Payer: Medicare Other | Admitting: Family Medicine

## 2017-03-03 ENCOUNTER — Ambulatory Visit: Payer: Medicare Other

## 2017-03-03 VITALS — BP 124/62 | HR 78 | Temp 98.7°F | Resp 12 | Wt 179.0 lb

## 2017-03-03 DIAGNOSIS — R002 Palpitations: Secondary | ICD-10-CM | POA: Diagnosis not present

## 2017-03-03 DIAGNOSIS — E1140 Type 2 diabetes mellitus with diabetic neuropathy, unspecified: Secondary | ICD-10-CM | POA: Diagnosis not present

## 2017-03-03 DIAGNOSIS — R531 Weakness: Secondary | ICD-10-CM | POA: Diagnosis not present

## 2017-03-03 DIAGNOSIS — G629 Polyneuropathy, unspecified: Secondary | ICD-10-CM

## 2017-03-03 DIAGNOSIS — M1710 Unilateral primary osteoarthritis, unspecified knee: Secondary | ICD-10-CM | POA: Insufficient documentation

## 2017-03-03 DIAGNOSIS — E039 Hypothyroidism, unspecified: Secondary | ICD-10-CM | POA: Diagnosis not present

## 2017-03-03 DIAGNOSIS — I10 Essential (primary) hypertension: Secondary | ICD-10-CM

## 2017-03-03 DIAGNOSIS — I4891 Unspecified atrial fibrillation: Secondary | ICD-10-CM

## 2017-03-03 DIAGNOSIS — M179 Osteoarthritis of knee, unspecified: Secondary | ICD-10-CM | POA: Insufficient documentation

## 2017-03-03 LAB — POCT URINALYSIS DIPSTICK
Bilirubin, UA: NEGATIVE
Blood, UA: NEGATIVE
Glucose, UA: NEGATIVE
Ketones, UA: NEGATIVE
Leukocytes, UA: NEGATIVE
Nitrite, UA: NEGATIVE
Spec Grav, UA: 1.02 (ref 1.010–1.025)
Urobilinogen, UA: 0.2 E.U./dL
pH, UA: 6 (ref 5.0–8.0)

## 2017-03-03 LAB — POCT INR
INR: 2.7
PT: 32.1

## 2017-03-03 LAB — POCT CBG (FASTING - GLUCOSE)-MANUAL ENTRY: Glucose Fasting, POC: 198 mg/dL — AB (ref 70–99)

## 2017-03-03 NOTE — Progress Notes (Signed)
Dawn Burnett  MRN: [id number] DOB: 1929/01/31  Subjective:  HPI  Patient is here due to not feeling well. Patient states she has not felt well for a while and then last night had a spell when she was trying to get in the shower where she felt like she could not stand up and get in there, felt wobbly and weak. Does not feel well today, weak, has had some palpitations with atrial fib per patient. Patient denies chest pain or tightness, no shortness of breath. Has chronic numbness in her fingers and feet. Patient states she feels like this is related to her heart that she is not feeling well. Checked INR today it is 2.7, Glucose level is 198, Urinalysis is normal. BP Readings from Last 3 Encounters:  03/03/17 124/62  12/17/16 132/62  12/10/16 138/76   Wt Readings from Last 3 Encounters:  03/03/17 179 lb (81.2 kg)  12/17/16 185 lb (83.9 kg)  12/10/16 186 lb (84.4 kg)   Patient Active Problem List   Diagnosis Date Noted  . Type 2 diabetes mellitus with diabetic nephropathy, without long-term current use of insulin ([place]) 09/01/2016  . Gastrointestinal stromal tumor (GIST) of stomach ([place]) 12/21/2014  . Allergic rhinitis 12/07/2014  . Arthritis 12/07/2014  . Atrial fibrillation, controlled ([place]) 12/07/2014  . Arteriosclerosis of coronary artery 12/07/2014  . Essential (primary) hypertension 12/07/2014  . Acid reflux 12/07/2014  . History of prolonged Q-T interval on ECG 12/07/2014  . Blood glucose elevated 12/07/2014  . Adult hypothyroidism 12/07/2014  . Urinary incontinence 12/07/2014  . Neuropathy 12/07/2014  . Adiposity 12/07/2014  . Osteopenia 12/07/2014  . HLD (hyperlipidemia) 12/07/2014  . Alteration in bowel elimination: incontinence 12/07/2014  . Hiatal hernia   . Atrial fibrillation ([place])   . Basal cell carcinoma of ear 11/14/2014  . Bladder infection, chronic 06/01/2012  . Urethral caruncle 06/01/2012  . SCC (squamous cell carcinoma), face 06/16/2011  . ABDOMINAL MASS  05/16/2009  . NONSPECIFIC ABN FINDING RAD & OTH EXAM GI TRACT 04/24/2009    Past Medical History:  Diagnosis Date  . Arthritis   . Atrial fibrillation ([place])   . CHF (congestive heart failure) ([place])   . GERD (gastroesophageal reflux disease)   . Hiatal hernia   . Hypertension   . Skin cancer, basal cell   . Thyroid disease     Social History   Social History  . Marital status: Widowed    Spouse name: widowed  . Number of children: 2  . Years of education: 12   Occupational History  . retired    Social History Main Topics  . Smoking status: Never Smoker  . Smokeless tobacco: Never Used  . Alcohol use No  . Drug use: No  . Sexual activity: No   Other Topics Concern  . Not on file   Social History Narrative  . No narrative on file    Outpatient Encounter Prescriptions as of 03/03/2017  Medication Sig Note  . acetaminophen (TYLENOL) 500 MG tablet Take 500 mg by mouth every 6 (six) hours as needed for mild pain, moderate pain, fever or headache.   Marland Kitchen atorvastatin (LIPITOR) 20 MG tablet Take 20 mg by mouth at bedtime.    . Calcium-Vitamin D 600-200 MG-UNIT per tablet Take 600 tablets by mouth daily with supper.    . celecoxib (CELEBREX) 200 MG capsule TAKE 1 CAPSULE (200 MG TOTAL) BY MOUTH DAILY.   Marland Kitchen COUMADIN 1 MG tablet TAKE 1 (ONE) TABLET TABLET, ORAL,  DAILY AS DIRECTED   . COUMADIN 4 MG tablet TAKE 1 TABLET AS DIRECTED 12/17/2016: Takes 5mg  MWF, 4mg  rest of the days  . diltiazem (CARDIZEM CD) 120 MG 24 hr capsule Take 120 mg by mouth daily.   Marland Kitchen ENTRESTO 24-26 MG Take 1 tablet by mouth 2 (two) times daily.    . fluticasone (FLONASE) 50 MCG/ACT nasal spray Place 2 sprays into both nostrils daily.   . furosemide (LASIX) 20 MG tablet Take 20 mg by mouth daily.    Marland Kitchen gabapentin (NEURONTIN) 600 MG tablet Take 1 tablet (600 mg total) by mouth 3 (three) times daily.   . metoprolol (LOPRESSOR) 100 MG tablet Take 100 mg by mouth 2 (two) times daily.    . mirabegron ER (MYRBETRIQ)  50 MG TB24 tablet Take 50 mg by mouth.   . Multiple Vitamins-Minerals (CENTRUM SILVER ULTRA WOMENS PO) Take 1 tablet by mouth daily with lunch.    . nystatin cream (MYCOSTATIN) APPLY  TOPICALLY 2 (TWO) TIMES DAILY. APPLY TO AFFECTED AREA (Patient taking differently: as needed. APPLY  TOPICALLY 2 (TWO) TIMES DAILY. APPLY TO AFFECTED AREA)   . omeprazole (PRILOSEC) 20 MG capsule TAKE ONE CAPSULE DAILY   . potassium chloride (K-DUR) 10 MEQ tablet Take 10 mEq by mouth daily. Reported on 12/24/2015   . ranitidine (ZANTAC) 150 MG tablet TAKE 1 TABLET BY MOUTH TWO TIMES DAILY   . SYNTHROID 50 MCG tablet TAKE 1 TABLET (50 MCG TOTAL) BY MOUTH DAILY BEFORE BREAKFAST.    No facility-administered encounter medications on file as of 03/03/2017.     Allergies  Allergen Reactions  . Iodinated Diagnostic Agents Rash  . Augmentin [Amoxicillin-Pot Clavulanate] Other (See Comments)    Gi distress   . Codeine   . Crestor [Rosuvastatin] Other (See Comments)    Unknown Unknown  . Levaquin [Levofloxacin In D5w]     Stomach pain, body pain/ache all over Stomach pain, body pain/ache all over  . Naproxen Other (See Comments)    unknown unknown  . Prednisone   . Sulfa Antibiotics   . Sulfacetamide Sodium   . Tolectin [Tolmetin]     Review of Systems  Constitutional: Positive for malaise/fatigue. Negative for chills and fever.  Respiratory: Negative.   Cardiovascular: Positive for palpitations. Negative for chest pain, claudication and leg swelling.  Gastrointestinal: Positive for constipation and diarrhea. Negative for abdominal pain, nausea and vomiting.  Genitourinary: Negative for dysuria and urgency.  Musculoskeletal: Positive for joint pain.       Stiffness, unsteady gait  Neurological: Positive for weakness.       Numbness in feet and fingers  Endo/Heme/Allergies: Negative.   Psychiatric/Behavioral: Negative.     Objective:  BP 124/62   Pulse 78   Temp 98.7 F (37.1 C)   Resp 12   Wt 179  lb (81.2 kg)   SpO2 96%   BMI 28.04 kg/m   Physical Exam  Constitutional: She is oriented to person, place, and time and well-developed, well-nourished, and in no distress. Vital signs are normal.  HENT:  Head: Normocephalic and atraumatic.  Right Ear: External ear normal.  Left Ear: External ear normal.  Nose: Nose normal.  Eyes: Pupils are equal, round, and reactive to light. Conjunctivae are normal. No scleral icterus.  Neck: No thyromegaly present.  Cardiovascular: Normal rate, regular rhythm, normal heart sounds and intact distal pulses.  Exam reveals no gallop.   No murmur heard. Pulmonary/Chest: Effort normal and breath sounds normal. No respiratory distress. She  has no wheezes.  Abdominal: Soft.  Musculoskeletal:  Using walker to ambulate  Neurological: She is alert and oriented to person, place, and time. Gait abnormal. GCS score is 15.  Skin: Skin is warm and dry.  Psychiatric: Mood, memory, affect and judgment normal.   Assessment and Plan :  1. Palpitation EKG stable. Will check lab work. - EKG 12-Lead - B Nat Peptide - Comprehensive metabolic panel  2. Weakness Refer back to Dr Humphrey Rolls. Will try to get patient in sooner then 03/19/17. - POCT CBG (Fasting - Glucose) - POCT urinalysis dipstick - B12 - B Nat Peptide - Troponin I - Comprehensive metabolic panel  3. Atrial fibrillation, controlled ([facility]) INR 2.7 good level. Continue coumadin 4 mg daily. - POCT INR; Standing - POCT INR - B12 - B Nat Peptide - Troponin I - Comprehensive metabolic panel  4. Adult hypothyroidism - TSH  5. Essential (primary) hypertension - CBC w/Diff/Platelet - Comprehensive metabolic panel  6. Type 2 diabetes mellitus with diabetic neuropathy, without long-term current use of insulin ([facility]) - B12  7. Neuropathy - B12  HPI, Exam and A&P transcribed by Theressa Millard, RMA under direction and in the presence of Miguel Aschoff, MD. I have done the exam and reviewed  the chart and it is accurate to the best of my knowledge. Development worker, community has been used and  any errors in dictation or transcription are unintentional. Miguel Aschoff M.D. [facility]

## 2017-03-03 NOTE — Patient Instructions (Signed)
Anticoagulation Warfarin Dose Instructions as of 03/03/2017      Dawn Burnett Tue Wed Thu Fri Sat   New Dose 4 mg 4 mg 4 mg 4 mg 4 mg 4 mg 4 mg    Description    Dx: Atrial Fibrillation Current coumadin dose: 4mg  daily PT:32.1 INR: 2.7 Today's changes: NO CHANGE Recheck:  4 weeks

## 2017-03-04 ENCOUNTER — Ambulatory Visit: Payer: Medicare Other | Admitting: Family Medicine

## 2017-03-04 LAB — COMPREHENSIVE METABOLIC PANEL
ALT: 12 IU/L (ref 0–32)
AST: 19 IU/L (ref 0–40)
Albumin/Globulin Ratio: 1.9 (ref 1.2–2.2)
Albumin: 4.2 g/dL (ref 3.5–4.7)
Alkaline Phosphatase: 52 IU/L (ref 39–117)
BUN/Creatinine Ratio: 22 (ref 12–28)
BUN: 22 mg/dL (ref 8–27)
Bilirubin Total: 0.8 mg/dL (ref 0.0–1.2)
CO2: 27 mmol/L (ref 20–29)
Calcium: 9.5 mg/dL (ref 8.7–10.3)
Chloride: 102 mmol/L (ref 96–106)
Creatinine, Ser: 1.01 mg/dL — ABNORMAL HIGH (ref 0.57–1.00)
GFR calc Af Amer: 58 mL/min/{1.73_m2} — ABNORMAL LOW (ref >59)
GFR calc non Af Amer: 51 mL/min/{1.73_m2} — ABNORMAL LOW (ref >59)
Globulin, Total: 2.2 g/dL (ref 1.5–4.5)
Glucose: 138 mg/dL — ABNORMAL HIGH (ref 65–99)
Potassium: 4.6 mmol/L (ref 3.5–5.2)
Sodium: 143 mmol/L (ref 134–144)
Total Protein: 6.4 g/dL (ref 6.0–8.5)

## 2017-03-04 LAB — TROPONIN I: Troponin I: 0.02 ng/mL (ref 0.00–0.04)

## 2017-03-04 LAB — CBC WITH DIFFERENTIAL/PLATELET
Basophils Absolute: 0 10*3/uL (ref 0.0–0.2)
Basos: 0 %
EOS (ABSOLUTE): 0.1 10*3/uL (ref 0.0–0.4)
Eos: 1 %
Hematocrit: 40.3 % (ref 34.0–46.6)
Hemoglobin: 13.7 g/dL (ref 11.1–15.9)
Immature Grans (Abs): 0 10*3/uL (ref 0.0–0.1)
Immature Granulocytes: 0 %
Lymphocytes Absolute: 2.3 10*3/uL (ref 0.7–3.1)
Lymphs: 29 %
MCH: 33.2 pg — ABNORMAL HIGH (ref 26.6–33.0)
MCHC: 34 g/dL (ref 31.5–35.7)
MCV: 98 fL — ABNORMAL HIGH (ref 79–97)
Monocytes Absolute: 0.4 10*3/uL (ref 0.1–0.9)
Monocytes: 6 %
Neutrophils Absolute: 5.2 10*3/uL (ref 1.4–7.0)
Neutrophils: 64 %
Platelets: 150 10*3/uL (ref 150–379)
RBC: 4.13 x10E6/uL (ref 3.77–5.28)
RDW: 13.9 % (ref 12.3–15.4)
WBC: 8.1 10*3/uL (ref 3.4–10.8)

## 2017-03-04 LAB — TSH: TSH: 0.963 u[IU]/mL (ref 0.450–4.500)

## 2017-03-04 LAB — VITAMIN B12: Vitamin B-12: 518 pg/mL (ref 232–1245)

## 2017-03-04 LAB — BRAIN NATRIURETIC PEPTIDE: BNP: 185.9 pg/mL — ABNORMAL HIGH (ref 0.0–100.0)

## 2017-03-19 ENCOUNTER — Ambulatory Visit (INDEPENDENT_AMBULATORY_CARE_PROVIDER_SITE_OTHER): Payer: Medicare Other | Admitting: Family Medicine

## 2017-03-19 ENCOUNTER — Encounter: Payer: Self-pay | Admitting: Family Medicine

## 2017-03-19 VITALS — BP 134/84 | HR 96 | Temp 98.3°F | Resp 16

## 2017-03-19 DIAGNOSIS — J0100 Acute maxillary sinusitis, unspecified: Secondary | ICD-10-CM | POA: Diagnosis not present

## 2017-03-19 MED ORDER — AMOXICILLIN 500 MG PO CAPS: 1000.0000 mg | ORAL_CAPSULE | Freq: Two times a day (BID) | ORAL | 0 refills | Status: AC

## 2017-03-19 NOTE — Progress Notes (Signed)
Patient: Dawn Burnett Female    DOB: October 01, 1928   83 y.o.   MRN: [id number] Visit Date: 03/19/2017  Today's Provider: Lelon Huh, MD   Chief Complaint  Patient presents with  . Sinusitis   Subjective:    Patient has had sinus congestion for 4/5 days. Also has symptoms of mils sinus pain and pressure. Patient also has bilateral ear congestion. Patient feels that she has been running a slight fever. Patient been taking tylenol with no relief.    Sinusitis  This is a new problem. The current episode started in the past 7 days (4-5 days ago). The problem has been gradually worsening since onset. There has been no fever. Associated symptoms include congestion and sinus pressure. Pertinent negatives include no chills, coughing, diaphoresis, ear pain, headaches, hoarse voice, neck pain, shortness of breath, sneezing, sore throat or swollen glands. Past treatments include acetaminophen. The treatment provided no relief.       Allergies  Allergen Reactions  . Iodinated Diagnostic Agents Rash  . Augmentin [Amoxicillin-Pot Clavulanate] Other (See Comments)    Gi distress   . Codeine   . Crestor [Rosuvastatin] Other (See Comments)    Unknown Unknown  . Levaquin [Levofloxacin In D5w]     Stomach pain, body pain/ache all over Stomach pain, body pain/ache all over  . Naproxen Other (See Comments)    unknown unknown  . Prednisone   . Sulfa Antibiotics   . Sulfacetamide Sodium   . Tolectin [Tolmetin]      Current Outpatient Prescriptions:  .  acetaminophen (TYLENOL) 500 MG tablet, Take 500 mg by mouth every 6 (six) hours as needed for mild pain, moderate pain, fever or headache., Disp: , Rfl:  .  atorvastatin (LIPITOR) 20 MG tablet, Take 20 mg by mouth at bedtime. , Disp: , Rfl:  .  Calcium-Vitamin D 600-200 MG-UNIT per tablet, Take 600 tablets by mouth daily with supper. , Disp: , Rfl:  .  celecoxib (CELEBREX) 200 MG capsule, TAKE 1 CAPSULE (200 MG TOTAL) BY MOUTH DAILY.,  Disp: 30 capsule, Rfl: 3 .  COUMADIN 1 MG tablet, TAKE 1 (ONE) TABLET TABLET, ORAL, DAILY AS DIRECTED, Disp: 30 tablet, Rfl: 11 .  COUMADIN 4 MG tablet, TAKE 1 TABLET AS DIRECTED, Disp: 30 tablet, Rfl: 11 .  diltiazem (CARDIZEM CD) 120 MG 24 hr capsule, Take 120 mg by mouth daily., Disp: , Rfl:  .  ENTRESTO 24-26 MG, Take 1 tablet by mouth 2 (two) times daily. , Disp: , Rfl:  .  fluticasone (FLONASE) 50 MCG/ACT nasal spray, Place 2 sprays into both nostrils daily., Disp: 16 g, Rfl: 6 .  furosemide (LASIX) 20 MG tablet, Take 20 mg by mouth daily. , Disp: , Rfl:  .  gabapentin (NEURONTIN) 600 MG tablet, Take 1 tablet (600 mg total) by mouth 3 (three) times daily., Disp: 90 tablet, Rfl: 11 .  metoprolol (LOPRESSOR) 100 MG tablet, Take 100 mg by mouth 2 (two) times daily. , Disp: , Rfl:  .  mirabegron ER (MYRBETRIQ) 50 MG TB24 tablet, Take 50 mg by mouth., Disp: , Rfl:  .  Multiple Vitamins-Minerals (CENTRUM SILVER ULTRA WOMENS PO), Take 1 tablet by mouth daily with lunch. , Disp: , Rfl:  .  nystatin cream (MYCOSTATIN), APPLY  TOPICALLY 2 (TWO) TIMES DAILY. APPLY TO AFFECTED AREA (Patient taking differently: as needed. APPLY  TOPICALLY 2 (TWO) TIMES DAILY. APPLY TO AFFECTED AREA), Disp: 30 g, Rfl: 1 .  omeprazole (PRILOSEC)  20 MG capsule, TAKE ONE CAPSULE DAILY, Disp: 30 capsule, Rfl: 11 .  potassium chloride (K-DUR) 10 MEQ tablet, Take 10 mEq by mouth daily. Reported on 12/24/2015, Disp: , Rfl:  .  ranitidine (ZANTAC) 150 MG tablet, TAKE 1 TABLET BY MOUTH TWO TIMES DAILY, Disp: 60 tablet, Rfl: 11 .  SYNTHROID 50 MCG tablet, TAKE 1 TABLET (50 MCG TOTAL) BY MOUTH DAILY BEFORE BREAKFAST., Disp: 90 tablet, Rfl: 3  Review of Systems  Constitutional: Negative for appetite change, chills, diaphoresis, fatigue and fever.  HENT: Positive for congestion, postnasal drip, sinus pain and sinus pressure. Negative for ear pain, hoarse voice, sneezing and sore throat.   Respiratory: Negative for cough, chest  tightness and shortness of breath.   Cardiovascular: Negative for chest pain and palpitations.  Gastrointestinal: Negative for abdominal pain, nausea and vomiting.  Musculoskeletal: Negative for neck pain.  Neurological: Negative for dizziness, weakness and headaches.    Social History  Substance Use Topics  . Smoking status: Never Smoker  . Smokeless tobacco: Never Used  . Alcohol use No   Objective:   BP 134/84 (BP Location: Right Arm, Patient Position: Sitting, Cuff Size: Normal)   Pulse 96   Temp 98.3 F (36.8 C) (Oral)   Resp 16   SpO2 97%     Physical Exam  General Appearance:    Alert, cooperative, no distress  HENT:   bilateral TM normal without fluid or infection, throat normal without erythema or exudate, left maxillary sinus tender and nasal mucosa congested  Eyes:    PERRL, conjunctiva/corneas clear, EOM's intact       Lungs:     Clear to auscultation bilaterally, respirations unlabored  Heart:    Regular rate and rhythm  Neurologic:   Awake, alert, oriented x 3. No apparent focal neurological           defect.         Assessment & Plan:     1. Acute maxillary sinusitis, recurrence not specified  - amoxicillin (AMOXIL) 500 MG capsule; Take 2 capsules (1,000 mg total) by mouth 2 (two) times daily.  Dispense: 40 capsule; Refill: 0  Call if symptoms change or if not rapidly improving.          Lelon Huh, MD  [facility]

## 2017-03-31 ENCOUNTER — Encounter: Payer: Self-pay | Admitting: Intensive Care

## 2017-03-31 ENCOUNTER — Emergency Department
Admission: EM | Admit: 2017-03-31 | Discharge: 2017-03-31 | Disposition: A | Discharge status: Home / Self Care | Payer: Medicare Other | Attending: Emergency Medicine | Admitting: Emergency Medicine

## 2017-03-31 ENCOUNTER — Ambulatory Visit (INDEPENDENT_AMBULATORY_CARE_PROVIDER_SITE_OTHER): Payer: Medicare Other

## 2017-03-31 ENCOUNTER — Telehealth: Payer: Self-pay

## 2017-03-31 DIAGNOSIS — I509 Heart failure, unspecified: Secondary | ICD-10-CM | POA: Diagnosis not present

## 2017-03-31 DIAGNOSIS — Z96651 Presence of right artificial knee joint: Secondary | ICD-10-CM | POA: Insufficient documentation

## 2017-03-31 DIAGNOSIS — D689 Coagulation defect, unspecified: Secondary | ICD-10-CM | POA: Diagnosis not present

## 2017-03-31 DIAGNOSIS — Z7901 Long term (current) use of anticoagulants: Secondary | ICD-10-CM | POA: Insufficient documentation

## 2017-03-31 DIAGNOSIS — I110 Hypertensive heart disease with heart failure: Secondary | ICD-10-CM | POA: Diagnosis not present

## 2017-03-31 DIAGNOSIS — I482 Chronic atrial fibrillation: Secondary | ICD-10-CM | POA: Diagnosis not present

## 2017-03-31 DIAGNOSIS — E1121 Type 2 diabetes mellitus with diabetic nephropathy: Secondary | ICD-10-CM | POA: Diagnosis not present

## 2017-03-31 DIAGNOSIS — R791 Abnormal coagulation profile: Secondary | ICD-10-CM

## 2017-03-31 DIAGNOSIS — E079 Disorder of thyroid, unspecified: Secondary | ICD-10-CM | POA: Diagnosis not present

## 2017-03-31 DIAGNOSIS — I4891 Unspecified atrial fibrillation: Secondary | ICD-10-CM

## 2017-03-31 LAB — CBC WITH DIFFERENTIAL/PLATELET
Basophils Absolute: 0.1 10*3/uL (ref 0–0.1)
Basophils Relative: 1 %
Eosinophils Absolute: 0.1 10*3/uL (ref 0–0.7)
Eosinophils Relative: 1 %
HCT: 41.1 % (ref 35.0–47.0)
Hemoglobin: 14 g/dL (ref 12.0–16.0)
Lymphocytes Relative: 26 %
Lymphs Abs: 2.1 10*3/uL (ref 1.0–3.6)
MCH: 33.5 pg (ref 26.0–34.0)
MCHC: 34 g/dL (ref 32.0–36.0)
MCV: 98.5 fL (ref 80.0–100.0)
Monocytes Absolute: 0.6 10*3/uL (ref 0.2–0.9)
Monocytes Relative: 8 %
Neutro Abs: 5.3 10*3/uL (ref 1.4–6.5)
Neutrophils Relative %: 64 %
Platelets: 134 10*3/uL — ABNORMAL LOW (ref 150–440)
RBC: 4.18 MIL/uL (ref 3.80–5.20)
RDW: 13.6 % (ref 11.5–14.5)
WBC: 8.1 10*3/uL (ref 3.6–11.0)

## 2017-03-31 LAB — POCT INR
INR: >8
Prothrombin Time: >96

## 2017-03-31 LAB — PROTIME-INR
INR: 4.9
INR: 4.68
Prothrombin Time: 47.1 seconds — ABNORMAL HIGH (ref 11.4–15.2)
Prothrombin Time: 45.4 seconds — ABNORMAL HIGH (ref 11.4–15.2)

## 2017-03-31 LAB — COMPREHENSIVE METABOLIC PANEL
ALT: 15 U/L (ref 14–54)
AST: 20 U/L (ref 15–41)
Albumin: 4 g/dL (ref 3.5–5.0)
Alkaline Phosphatase: 40 U/L (ref 38–126)
Anion gap: 8 (ref 5–15)
BUN: 24 mg/dL — ABNORMAL HIGH (ref 6–20)
CO2: 28 mmol/L (ref 22–32)
Calcium: 9.6 mg/dL (ref 8.9–10.3)
Chloride: 107 mmol/L (ref 101–111)
Creatinine, Ser: 0.89 mg/dL (ref 0.44–1.00)
GFR calc Af Amer: >60 mL/min (ref >60)
GFR calc non Af Amer: 57 mL/min — ABNORMAL LOW (ref >60)
Glucose, Bld: 137 mg/dL — ABNORMAL HIGH (ref 65–99)
Potassium: 4.2 mmol/L (ref 3.5–5.1)
Sodium: 143 mmol/L (ref 135–145)
Total Bilirubin: 0.8 mg/dL (ref 0.3–1.2)
Total Protein: 6.8 g/dL (ref 6.5–8.1)

## 2017-03-31 LAB — APTT: aPTT: 42 seconds — ABNORMAL HIGH (ref 24–36)

## 2017-03-31 NOTE — ED Provider Notes (Signed)
[facility] Emergency Department Provider Note       Time seen: ----------------------------------------- 12:44 PM on 03/31/2017 -----------------------------------------     I have reviewed the triage vital signs and the nursing notes.   HISTORY   Chief Complaint Coagulation Disorder    HPI Dawn Burnett is a 83 y.o. female who presents to the ED for coagulopathy. Reportedly she is on Coumadin for chronic atrial fibrillation and had outpatient lab work which revealed coagulopathy. INR was reported to be greater than 8. She's not had any bleeding, hematuria, melena or hematochezia. She denies any other complaints. Recently she was on amoxicillin for sinusitis which is believed to be the source for her coagulopathy..   Past Medical History:  Diagnosis Date  . Arthritis   . Atrial fibrillation ([place])   . CHF (congestive heart failure) ([place])   . GERD (gastroesophageal reflux disease)   . Hiatal hernia   . Hypertension   . Skin cancer, basal cell   . Thyroid disease     Patient Active Problem List   Diagnosis Date Noted  . Osteoarthritis of knee 03/03/2017  . Type 2 diabetes mellitus with diabetic nephropathy, without long-term current use of insulin ([place]) 09/01/2016  . Gastrointestinal stromal tumor (GIST) of stomach ([place]) 12/21/2014  . Allergic rhinitis 12/07/2014  . Arthritis 12/07/2014  . Atrial fibrillation, controlled ([place]) 12/07/2014  . Arteriosclerosis of coronary artery 12/07/2014  . Essential (primary) hypertension 12/07/2014  . Acid reflux 12/07/2014  . History of prolonged Q-T interval on ECG 12/07/2014  . Blood glucose elevated 12/07/2014  . Adult hypothyroidism 12/07/2014  . Urinary incontinence 12/07/2014  . Neuropathy 12/07/2014  . Adiposity 12/07/2014  . Osteopenia 12/07/2014  . HLD (hyperlipidemia) 12/07/2014  . Alteration in bowel elimination: incontinence 12/07/2014  . Hiatal hernia   . Atrial fibrillation ([place])   .  Basal cell carcinoma of ear 11/14/2014  . Bladder infection, chronic 06/01/2012  . Urethral caruncle 06/01/2012  . SCC (squamous cell carcinoma), face 06/16/2011  . ABDOMINAL MASS 05/16/2009  . NONSPECIFIC ABN FINDING RAD & OTH EXAM GI TRACT 04/24/2009    Past Surgical History:  Procedure Laterality Date  . ABDOMINAL HYSTERECTOMY    . APPENDECTOMY    . BREAST SURGERY    . cateract extraction    . EYE SURGERY     done by Dr. Vickki Muff  . KNEE SURGERY    . 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 right hip surgery    . right total knee replacement      Allergies Iodinated diagnostic agents; Augmentin [amoxicillin-pot clavulanate]; Codeine; Crestor [rosuvastatin]; Levaquin [levofloxacin in d5w]; Naproxen; Prednisone; Sulfa antibiotics; Sulfacetamide sodium; and Tolectin [tolmetin]  Social History Social History  Substance Use Topics  . Smoking status: Never Smoker  . Smokeless tobacco: Never Used  . Alcohol use No    Review of Systems Constitutional: Negative for fever. Eyes: Negative for vision changes ENT:  Negative for congestion, sore throat Cardiovascular: Negative for chest pain. Respiratory: Negative for shortness of breath. Gastrointestinal: Negative for abdominal pain, vomiting and diarrhea. Genitourinary: Negative for dysuria. Musculoskeletal: Negative for back pain. Skin: Negative for rash. Neurological: Negative for headaches, focal weakness or numbness.  All systems negative/normal/unremarkable except as stated in the HPI  ____________________________________________   PHYSICAL EXAM:  VITAL SIGNS: ED Triage Vitals [03/31/17 1119]  Enc Vitals Group     BP (!) 154/112     Pulse Rate 73     Resp  18     Temp 98.5 F (36.9 C)     Temp Source Oral     SpO2 96 %     Weight 174 lb (78.9 kg)     Height 5\' 7"  (1.702 m)     Head Circumference      Peak Flow      Pain Score      Pain Loc      Pain Edu?      Excl. in [place]?      Constitutional: Alert and oriented. Well appearing and in no distress. Eyes: Conjunctivae are normal. Normal extraocular movements. Cardiovascular: Irregularly irregular rhythm. No murmurs, rubs, or gallops. Respiratory: Normal respiratory effort without tachypnea nor retractions. Breath sounds are clear and equal bilaterally. No wheezes/rales/rhonchi. Gastrointestinal: Soft and nontender. Normal bowel sounds Musculoskeletal: Nontender with normal range of motion in extremities. No lower extremity tenderness nor edema. Neurologic:  Normal speech and language. No gross focal neurologic deficits are appreciated.  Skin:  Skin is warm, dry and intact. No rash noted. Psychiatric: Mood and affect are normal. Speech and behavior are normal.  ___________________________________________  ED COURSE:  Pertinent labs & imaging results that were available during my care of the patient were reviewed by me and considered in my medical decision making (see chart for details). Patient presents for coagulopathy, we will assess with labs and imaging as indicated.   Procedures ____________________________________________   LABS (pertinent positives/negatives)  Labs Reviewed  CBC WITH DIFFERENTIAL/PLATELET - Abnormal; Notable for the following:       Result Value   Platelets 134 (*)    All other components within normal limits  COMPREHENSIVE METABOLIC PANEL - Abnormal; Notable for the following:    Glucose, Bld 137 (*)    BUN 24 (*)    GFR calc non Af Amer 57 (*)    All other components within normal limits  PROTIME-INR - Abnormal; Notable for the following:    Prothrombin Time 47.1 (*)    INR 4.90 (*)    All other components within normal limits  APTT - Abnormal; Notable for the following:    aPTT 42 (*)    All other components within normal limits  PROTIME-INR   ____________________________________________  FINAL ASSESSMENT AND PLAN  Coagulopathy  Plan: Patient's labs were dictated  above. Patient had presented for elevated INR as drawn as an outpatient. Here her INR was 4.9 without any bleeding. This does not require any specific treatment at this time. We will redraw to a test for this and contact the patient with any abnormalities. I have advised her holding her Coumadin until Friday.   Earleen Newport, MD   Note: This note was generated in part or whole with voice recognition software. Voice recognition is usually quite accurate but there are transcription errors that can and very often do occur. I apologize for any typographical errors that were not detected and corrected.     Earleen Newport, MD 03/31/17 1300

## 2017-03-31 NOTE — Telephone Encounter (Signed)
Patient is requesting a call back from [place], she states that she wants to discuss this morning with her. KW

## 2017-03-31 NOTE — Patient Instructions (Signed)
Anticoagulation Warfarin Dose Instructions as of 03/31/2017      Dawn Burnett Tue Wed Thu Fri Sat   New Dose 4 mg 4 mg 4 mg 4 mg 4 mg 4 mg 4 mg    Description    Dx: Atrial Fibrillation Current coumadin dose: 4mg  daily Hold for 3 days and start medication on Sunday 04/04/17. Leave office today and head to [facility] ER for evaluation

## 2017-03-31 NOTE — ED Triage Notes (Signed)
Patient had checkup today and blood work drawn at PCP Miguel Aschoff and was told her INR >8. A&O x4. Denies chest pain or SOB. HX Afib. Patient takes coumadin daily

## 2017-04-09 ENCOUNTER — Ambulatory Visit: Payer: Self-pay

## 2017-04-09 ENCOUNTER — Encounter: Payer: Self-pay | Admitting: Family Medicine

## 2017-04-09 ENCOUNTER — Ambulatory Visit (INDEPENDENT_AMBULATORY_CARE_PROVIDER_SITE_OTHER): Payer: Medicare Other

## 2017-04-09 DIAGNOSIS — I4891 Unspecified atrial fibrillation: Secondary | ICD-10-CM

## 2017-04-09 LAB — POCT INR
INR: 2
PT: 23.7

## 2017-04-09 NOTE — Patient Instructions (Signed)
Anticoagulation Warfarin Dose Instructions as of 04/09/2017      Dawn Burnett Tue Wed Thu Fri Sat   New Dose 4 mg 4 mg 4 mg 4 mg 4 mg 4 mg 4 mg    Description   4 mg daily, f/u in 2 weeks

## 2017-04-20 ENCOUNTER — Ambulatory Visit (INDEPENDENT_AMBULATORY_CARE_PROVIDER_SITE_OTHER): Payer: Medicare Other | Admitting: Family Medicine

## 2017-04-20 ENCOUNTER — Telehealth: Payer: Self-pay | Admitting: Family Medicine

## 2017-04-20 VITALS — BP 140/72 | HR 76 | Temp 98.0°F | Resp 16 | Wt 179.0 lb

## 2017-04-20 DIAGNOSIS — R2681 Unsteadiness on feet: Secondary | ICD-10-CM

## 2017-04-20 DIAGNOSIS — E1121 Type 2 diabetes mellitus with diabetic nephropathy: Secondary | ICD-10-CM

## 2017-04-20 DIAGNOSIS — I4891 Unspecified atrial fibrillation: Secondary | ICD-10-CM

## 2017-04-20 LAB — POCT INR
INR: 3.4
PT: 40.4

## 2017-04-20 LAB — POCT GLYCOSYLATED HEMOGLOBIN (HGB A1C): Hemoglobin A1C: 7.7

## 2017-04-20 MED ORDER — WARFARIN SODIUM 3 MG PO TABS: 3.0000 mg | ORAL_TABLET | Freq: Every day | ORAL | 12 refills | Status: DC

## 2017-04-20 NOTE — Progress Notes (Signed)
Dawn Burnett  MRN: [id number] DOB: [birth year]  Subjective:  HPI   The patient is an 83 year old female who presents for follow up of her diabetes.  She is also here to have her PT/INR done. The patient was last seen in the office for chronic conditions on 12/10/16.  At that time her A1C was 7.1.  Patient states she does not check her glucose at home and never has.   She last had her INR done on 04/09/17 and it was 2.0 and she is on 4 mg of Warfarin daily.  Patient states she has a yeast infection on the side of her abdomen and is using some powder that she got from Dr Jacqlyn Larsen at one time.  She said she would like to have it looked at to see if she needs to have something else done.  She also complains that she has a lump on her left arm that you have seen before and she would like for you to check it again.   Patient Active Problem List   Diagnosis Date Noted  . Osteoarthritis of knee 03/03/2017  . Type 2 diabetes mellitus with diabetic nephropathy, without long-term current use of insulin ([place]) 09/01/2016  . Gastrointestinal stromal tumor (GIST) of stomach ([place]) 12/21/2014  . Allergic rhinitis 12/07/2014  . Arthritis 12/07/2014  . Atrial fibrillation, controlled ([place]) 12/07/2014  . Arteriosclerosis of coronary artery 12/07/2014  . Essential (primary) hypertension 12/07/2014  . Acid reflux 12/07/2014  . History of prolonged Q-T interval on ECG 12/07/2014  . Blood glucose elevated 12/07/2014  . Adult hypothyroidism 12/07/2014  . Urinary incontinence 12/07/2014  . Neuropathy 12/07/2014  . Adiposity 12/07/2014  . Osteopenia 12/07/2014  . HLD (hyperlipidemia) 12/07/2014  . Alteration in bowel elimination: incontinence 12/07/2014  . Hiatal hernia   . Atrial fibrillation ([place])   . Basal cell carcinoma of ear 11/14/2014  . Bladder infection, chronic 06/01/2012  . Urethral caruncle 06/01/2012  . SCC (squamous cell carcinoma), face 06/16/2011  . ABDOMINAL MASS 05/16/2009  . NONSPECIFIC  ABN FINDING RAD & OTH EXAM GI TRACT 04/24/2009    Past Medical History:  Diagnosis Date  . Arthritis   . Atrial fibrillation ([place])   . CHF (congestive heart failure) ([place])   . GERD (gastroesophageal reflux disease)   . Hiatal hernia   . Hypertension   . Skin cancer, basal cell   . Thyroid disease     Social History   Social History  . Marital status: Widowed    Spouse name: widowed  . Number of children: 2  . Years of education: 12   Occupational History  . retired    Social History Main Topics  . Smoking status: Never Smoker  . Smokeless tobacco: Never Used  . Alcohol use No  . Drug use: No  . Sexual activity: No   Other Topics Concern  . Not on file   Social History Narrative  . No narrative on file    Outpatient Encounter Prescriptions as of 04/20/2017  Medication Sig Note  . acetaminophen (TYLENOL) 500 MG tablet Take 500 mg by mouth every 6 (six) hours as needed for mild pain, moderate pain, fever or headache.   Marland Kitchen atorvastatin (LIPITOR) 20 MG tablet Take 20 mg by mouth at bedtime.    . Calcium-Vitamin D 600-200 MG-UNIT per tablet Take 600 tablets by mouth daily with supper.    . celecoxib (CELEBREX) 200 MG capsule TAKE 1 CAPSULE (200 MG TOTAL) BY MOUTH DAILY.   Marland Kitchen  COUMADIN 1 MG tablet TAKE 1 (ONE) TABLET TABLET, ORAL, DAILY AS DIRECTED   . COUMADIN 4 MG tablet TAKE 1 TABLET AS DIRECTED 12/17/2016: Takes 5mg  MWF, 4mg  rest of the days  . diltiazem (CARDIZEM CD) 120 MG 24 hr capsule Take 120 mg by mouth daily.   Marland Kitchen ENTRESTO 24-26 MG Take 1 tablet by mouth 2 (two) times daily.    . fluticasone (FLONASE) 50 MCG/ACT nasal spray Place 2 sprays into both nostrils daily.   . furosemide (LASIX) 20 MG tablet Take 20 mg by mouth daily.    Marland Kitchen gabapentin (NEURONTIN) 600 MG tablet Take 1 tablet (600 mg total) by mouth 3 (three) times daily.   . metoprolol (LOPRESSOR) 100 MG tablet Take 100 mg by mouth 2 (two) times daily.    . mirabegron ER (MYRBETRIQ) 50 MG TB24 tablet Take 50  mg by mouth.   . Multiple Vitamins-Minerals (CENTRUM SILVER ULTRA WOMENS PO) Take 1 tablet by mouth daily with lunch.    . nystatin cream (MYCOSTATIN) APPLY  TOPICALLY 2 (TWO) TIMES DAILY. APPLY TO AFFECTED AREA (Patient taking differently: as needed. APPLY  TOPICALLY 2 (TWO) TIMES DAILY. APPLY TO AFFECTED AREA)   . omeprazole (PRILOSEC) 20 MG capsule TAKE ONE CAPSULE DAILY   . potassium chloride (K-DUR) 10 MEQ tablet Take 10 mEq by mouth daily. Reported on 12/24/2015   . ranitidine (ZANTAC) 150 MG tablet TAKE 1 TABLET BY MOUTH TWO TIMES DAILY   . SYNTHROID 50 MCG tablet TAKE 1 TABLET (50 MCG TOTAL) BY MOUTH DAILY BEFORE BREAKFAST.    No facility-administered encounter medications on file as of 04/20/2017.     Allergies  Allergen Reactions  . Iodinated Diagnostic Agents Rash  . Augmentin [Amoxicillin-Pot Clavulanate] Other (See Comments)    Gi distress   . Codeine   . Crestor [Rosuvastatin] Other (See Comments)    Unknown Unknown  . Levaquin [Levofloxacin In D5w]     Stomach pain, body pain/ache all over Stomach pain, body pain/ache all over  . Naproxen Other (See Comments)    unknown unknown  . Prednisone   . Sulfa Antibiotics   . Sulfacetamide Sodium   . Tolectin [Tolmetin]     Review of Systems  Constitutional: Positive for malaise/fatigue. Negative for fever.  HENT: Negative.   Eyes: Negative.   Respiratory: Negative for cough, hemoptysis, sputum production, shortness of breath and wheezing.   Cardiovascular: Positive for palpitations. Negative for chest pain, orthopnea, claudication and leg swelling.  Gastrointestinal: Negative.   Genitourinary: Negative for frequency.  Skin: Negative.   Neurological: Positive for weakness.  Endo/Heme/Allergies: Negative.  Negative for polydipsia.  Psychiatric/Behavioral: Negative.     Objective:  BP 140/72 (BP Location: Right Arm, Patient Position: Sitting, Cuff Size: Normal)   Pulse 76   Temp 98 F (36.7 C) (Oral)   Resp 16    Wt 179 lb (81.2 kg)   BMI 28.04 kg/m   Physical Exam  Constitutional: She is oriented to person, place, and time and well-developed, well-nourished, and in no distress.  HENT:  Head: Normocephalic and atraumatic.  Eyes: Conjunctivae are normal. No scleral icterus.  Neck: No thyromegaly present.  Cardiovascular: Normal rate, regular rhythm and normal heart sounds.   Pulmonary/Chest: Effort normal and breath sounds normal.  Abdominal: Soft.  Neurological: She is alert and oriented to person, place, and time. Gait normal. GCS score is 15.  Skin: Skin is warm and dry.  Psychiatric: Mood, memory, affect and judgment normal.    Assessment  and Plan :  1. Atrial fibrillation, controlled ([place])  - POCT INR--  2. Type 2 diabetes mellitus with diabetic nephropathy, without long-term current use of insulin ([facility])  - POCT glycosylated hemoglobin (Hb A1C)--7.7 today  3. Unsteady gait - Ambulatory referral to Home Health 4.Chronic Neuropathy Multifactorial.--try Alpha Lioic Acid. 5.OA 6.Lipoma of arms 7.Monilial Dermatitis Improved.  I have done the exam and reviewed the chart and it is accurate to the best of my knowledge. Development worker, community has been used and  any errors in dictation or transcription are unintentional. Miguel Aschoff M.D. [facility]

## 2017-04-20 NOTE — Patient Instructions (Addendum)
Anticoagulation Warfarin Dose Instructions as of 04/20/2017      Dorene Grebe Tue Wed Thu Fri Sat   New Dose 4 mg 3 mg 4 mg 3 mg 4 mg 3 mg 4 mg   Alt Week 3 mg 4 mg 3 mg 4 mg 3 mg 4 mg 3 mg    Description   Alternate 3 mg & 4 mg daily, f/u in 1 week    Use Alpha Lipoic Acid, OTC

## 2017-04-20 NOTE — Telephone Encounter (Signed)
Pt would like Elena to call her back regarding the appt on Tuesday. That you made for her.  Her call back is [phone] Thanks Con Memos

## 2017-04-27 ENCOUNTER — Ambulatory Visit: Payer: Self-pay | Admitting: Family Medicine

## 2017-04-30 ENCOUNTER — Ambulatory Visit (INDEPENDENT_AMBULATORY_CARE_PROVIDER_SITE_OTHER): Payer: Medicare Other

## 2017-04-30 DIAGNOSIS — I4891 Unspecified atrial fibrillation: Secondary | ICD-10-CM

## 2017-04-30 DIAGNOSIS — Z23 Encounter for immunization: Secondary | ICD-10-CM

## 2017-04-30 LAB — POCT INR
INR: 2.3
PT: 28.1

## 2017-04-30 NOTE — Patient Instructions (Signed)
Anticoagulation Warfarin Dose Instructions as of 04/30/2017      Dawn Burnett Tue Wed Thu Fri Sat   New Dose 4 mg 3 mg 4 mg 3 mg 4 mg 3 mg 4 mg   Alt Week 3 mg 4 mg 3 mg 4 mg 3 mg 4 mg 3 mg    Description   Alternate 3 mg & 4 mg daily, f/u in 3 week

## 2017-05-03 ENCOUNTER — Ambulatory Visit: Payer: Medicare Other | Admitting: Podiatry

## 2017-05-17 ENCOUNTER — Other Ambulatory Visit: Payer: Self-pay | Admitting: Family Medicine

## 2017-05-17 NOTE — Telephone Encounter (Signed)
Pharmacy requesting refills. Thanks!  

## 2017-05-19 ENCOUNTER — Other Ambulatory Visit: Payer: Self-pay

## 2017-05-19 ENCOUNTER — Ambulatory Visit (INDEPENDENT_AMBULATORY_CARE_PROVIDER_SITE_OTHER): Payer: Medicare Other

## 2017-05-19 ENCOUNTER — Other Ambulatory Visit: Payer: Self-pay | Admitting: Family Medicine

## 2017-05-19 DIAGNOSIS — I4891 Unspecified atrial fibrillation: Secondary | ICD-10-CM | POA: Diagnosis not present

## 2017-05-19 LAB — POCT INR
INR: 2.3
PT: 27.8

## 2017-05-19 NOTE — Telephone Encounter (Signed)
Patient is requesting refills. She is out of medication. Thanks!

## 2017-05-19 NOTE — Patient Instructions (Addendum)
Anticoagulation Warfarin Dose Instructions as of 05/19/2017      Dawn Burnett Tue Wed Thu Fri Sat   New Dose 4 mg 3 mg 4 mg 3 mg 4 mg 3 mg 4 mg   Alt Week 3 mg 4 mg 3 mg 4 mg 3 mg 4 mg 3 mg    Description   Alternate 3 mg & 4 mg daily, f/u in 4 week

## 2017-05-20 MED ORDER — GABAPENTIN 600 MG PO TABS: 600.0000 mg | ORAL_TABLET | Freq: Three times a day (TID) | ORAL | 11 refills | Status: DC

## 2017-05-20 MED ORDER — CELECOXIB 100 MG PO CAPS: 100.0000 mg | ORAL_CAPSULE | Freq: Every day | ORAL | 4 refills | Status: DC

## 2017-05-20 NOTE — Telephone Encounter (Signed)
Pharmacy requesting refills. Thanks!  

## 2017-05-24 ENCOUNTER — Ambulatory Visit (INDEPENDENT_AMBULATORY_CARE_PROVIDER_SITE_OTHER): Payer: Medicare Other | Admitting: Podiatry

## 2017-05-24 DIAGNOSIS — E1142 Type 2 diabetes mellitus with diabetic polyneuropathy: Secondary | ICD-10-CM

## 2017-05-24 DIAGNOSIS — B351 Tinea unguium: Secondary | ICD-10-CM

## 2017-05-24 DIAGNOSIS — M79676 Pain in unspecified toe(s): Secondary | ICD-10-CM

## 2017-05-24 DIAGNOSIS — L03031 Cellulitis of right toe: Secondary | ICD-10-CM

## 2017-05-24 MED ORDER — DOXYCYCLINE HYCLATE 100 MG PO TABS: 100.0000 mg | ORAL_TABLET | Freq: Two times a day (BID) | ORAL | 0 refills | Status: DC

## 2017-05-24 NOTE — Progress Notes (Signed)
Complaint:  Visit Type: Patient returns to my office for continued preventative foot care services. Complaint: Patient states" my nails have grown long and thick and become painful to walk and wear shoes" Patient has been diagnosed with hyperglycemia with neuropathy..m This patient says she is having pain at the tip of her right big toenail inside border.  This pain has been present for about 10 days.  She called for an appointment and this was the soonest she could get an appointment. The patient presents for preventative foot care services and evaluation of her right big toenail. No changes to ROS.  Patient presents at about 4 months , not three asp recommended.  Podiatric Exam: Vascular: dorsalis pedis and posterior tibial pulses are palpable bilateral. Capillary return is immediate. Temperature gradient is WNL. Skin turgor WNL  Dorsal skin lesions due to vascular. Sensorium: Normal Semmes Weinstein monofilament test. Normal tactile sensation bilaterally. Nail Exam: Pt has thick disfigured discolored nails with subungual debris noted bilateral entire nail hallux through fifth toenails.  Marked incurvation medial border right great toenail.  Brown fluctuance noted at the distal tip medial border right great toenail. Ulcer Exam: There is no evidence of ulcer or pre-ulcerative changes or infection. Orthopedic Exam: Muscle tone and strength are WNL. No limitations in general ROM. No crepitus or effusions noted. Foot type and digits show no abnormalities.  HAV  B/L Skin: No Porokeratosis. No infection or ulcers  Diagnosis:  Onychomycosis, , Pain in right toe, pain in left toes.  Paronychia right hallux.  Treatment & Plan Procedures and Treatment: Consent by patient was obtained for treatment procedures. The patient understood the discussion of treatment and procedures well. All questions were answered thoroughly reviewed. Debridement of mycotic and hypertrophic toenails, 1 through 5 bilateral and  clearing of subungual debris. No ulceration, no infection noted. Incision and drainage medial border right hallux,  Neosporin/DSD.  Home instructions given.  Prescribe doxycycline  # 20. Patient described coumadin problem from taking amoxicillin. Return Visit-Office Procedure: Patient instructed to return to the office for a follow up visit 3 months for continued evaluation and treatment.    Gardiner Barefoot DPM

## 2017-05-26 ENCOUNTER — Other Ambulatory Visit: Payer: Self-pay

## 2017-05-26 MED ORDER — CELECOXIB 200 MG PO CAPS: 200.0000 mg | ORAL_CAPSULE | Freq: Every day | ORAL | 3 refills | Status: DC

## 2017-05-28 ENCOUNTER — Ambulatory Visit (INDEPENDENT_AMBULATORY_CARE_PROVIDER_SITE_OTHER): Payer: Medicare Other

## 2017-05-28 DIAGNOSIS — I4891 Unspecified atrial fibrillation: Secondary | ICD-10-CM | POA: Diagnosis not present

## 2017-05-28 LAB — POCT INR
INR: 2.3
PT: 27.7

## 2017-05-28 NOTE — Patient Instructions (Addendum)
Continue to Alternate 3mg  and 4mg  daily.  Recheck next week, secondary to being on Doxycycline.

## 2017-06-03 ENCOUNTER — Ambulatory Visit (INDEPENDENT_AMBULATORY_CARE_PROVIDER_SITE_OTHER): Payer: Medicare Other

## 2017-06-03 DIAGNOSIS — I4891 Unspecified atrial fibrillation: Secondary | ICD-10-CM

## 2017-06-03 LAB — POCT INR
INR: 2.3
Prothrombin Time: 27.8

## 2017-06-03 NOTE — Patient Instructions (Signed)
Anticoagulation Warfarin Dose Instructions as of 06/03/2017      Dawn Burnett Tue Wed Thu Fri Sat   New Dose 4 mg 3 mg 4 mg 3 mg 4 mg 3 mg 4 mg   Alt Week 3 mg 4 mg 3 mg 4 mg 3 mg 4 mg 3 mg    Description   Continue to Alternate 3mg  and 4mg  daily. Return in 4 weeks.

## 2017-06-04 ENCOUNTER — Ambulatory Visit: Payer: Medicare Other

## 2017-06-07 ENCOUNTER — Other Ambulatory Visit: Payer: Self-pay | Admitting: Family Medicine

## 2017-06-16 ENCOUNTER — Ambulatory Visit: Payer: Self-pay

## 2017-06-28 ENCOUNTER — Ambulatory Visit: Payer: Medicare Other | Admitting: Podiatry

## 2017-06-28 ENCOUNTER — Ambulatory Visit: Payer: Medicare Other | Admitting: Family Medicine

## 2017-06-28 VITALS — BP 118/62 | HR 80 | Temp 97.5°F | Resp 16 | Wt 179.2 lb

## 2017-06-28 DIAGNOSIS — I4891 Unspecified atrial fibrillation: Secondary | ICD-10-CM | POA: Diagnosis not present

## 2017-06-28 DIAGNOSIS — S93505A Unspecified sprain of left lesser toe(s), initial encounter: Secondary | ICD-10-CM | POA: Diagnosis not present

## 2017-06-28 LAB — PROTIME-INR
INR: 1.6 — ABNORMAL HIGH
Prothrombin Time: 17 s — ABNORMAL HIGH (ref 9.0–11.5)

## 2017-06-28 NOTE — Progress Notes (Signed)
CHE BELOW  MRN: [id number] DOB: Dec 03, 1928  Subjective:  HPI  Patient states that Wednesday last week-06/23/17 she was putting on fairly new pair of compression stockings and she kept pulling on them to try and get them on since they were so tight and then realized the way her left pinky toe was place in the stocking as she was pulling hard on them her toe was pulled backwards and thinks she broke it. She can take her pinky toe and move it all the way around. There is bruising present in toes. She has been taping her picky toe to the other toes to keep it in place. Patient Active Problem List   Diagnosis Date Noted  . Osteoarthritis of knee 03/03/2017  . Type 2 diabetes mellitus with diabetic nephropathy, without long-term current use of insulin ([place]) 09/01/2016  . Gastrointestinal stromal tumor (GIST) of stomach ([place]) 12/21/2014  . Allergic rhinitis 12/07/2014  . Arthritis 12/07/2014  . Atrial fibrillation, controlled ([place]) 12/07/2014  . Arteriosclerosis of coronary artery 12/07/2014  . Essential (primary) hypertension 12/07/2014  . Acid reflux 12/07/2014  . History of prolonged Q-T interval on ECG 12/07/2014  . Blood glucose elevated 12/07/2014  . Adult hypothyroidism 12/07/2014  . Urinary incontinence 12/07/2014  . Neuropathy 12/07/2014  . Adiposity 12/07/2014  . Osteopenia 12/07/2014  . HLD (hyperlipidemia) 12/07/2014  . Alteration in bowel elimination: incontinence 12/07/2014  . Hiatal hernia   . Atrial fibrillation ([place])   . Basal cell carcinoma of ear 11/14/2014  . Bladder infection, chronic 06/01/2012  . Urethral caruncle 06/01/2012  . SCC (squamous cell carcinoma), face 06/16/2011  . ABDOMINAL MASS 05/16/2009  . NONSPECIFIC ABN FINDING RAD & OTH EXAM GI TRACT 04/24/2009    Past Medical History:  Diagnosis Date  . Arthritis   . Atrial fibrillation ([place])   . CHF (congestive heart failure) ([place])   . GERD (gastroesophageal reflux disease)   . Hiatal hernia   .  Hypertension   . Skin cancer, basal cell   . Thyroid disease     Social History   Socioeconomic History  . Marital status: Widowed    Spouse name: widowed  . Number of children: 2  . Years of education: 87  . Highest education level: Not on file  Social Needs  . Financial resource strain: Not on file  . Food insecurity - worry: Not on file  . Food insecurity - inability: Not on file  . Transportation needs - medical: Not on file  . Transportation needs - non-medical: Not on file  Occupational History  . Occupation: retired  Tobacco Use  . Smoking status: Never Smoker  . Smokeless tobacco: Never Used  Substance and Sexual Activity  . Alcohol use: No  . Drug use: No  . Sexual activity: No  Other Topics Concern  . Not on file  Social History Narrative  . Not on file    Outpatient Encounter Medications as of 06/28/2017  Medication Sig Note  . acetaminophen (TYLENOL) 500 MG tablet Take 500 mg by mouth every 6 (six) hours as needed for mild pain, moderate pain, fever or headache.   Marland Kitchen atorvastatin (LIPITOR) 20 MG tablet Take 20 mg by mouth at bedtime.    . Calcium-Vitamin D 600-200 MG-UNIT per tablet Take 600 tablets by mouth daily with supper.    . celecoxib (CELEBREX) 200 MG capsule Take 1 capsule (200 mg total) by mouth daily.   Marland Kitchen COUMADIN 1 MG tablet TAKE 1 (ONE) TABLET TABLET,  ORAL, DAILY AS DIRECTED   . COUMADIN 4 MG tablet TAKE 1 TABLET AS DIRECTED 12/17/2016: Takes 5mg  MWF, 4mg  rest of the days  . diltiazem (CARDIZEM CD) 120 MG 24 hr capsule Take 120 mg by mouth daily.   Marland Kitchen doxycycline (VIBRA-TABS) 100 MG tablet Take 1 tablet (100 mg total) by mouth 2 (two) times daily.   Marland Kitchen ENTRESTO 24-26 MG Take 1 tablet by mouth 2 (two) times daily.    . fluticasone (FLONASE) 50 MCG/ACT nasal spray Place 2 sprays into both nostrils daily.   . furosemide (LASIX) 20 MG tablet Take 20 mg by mouth daily.    Marland Kitchen gabapentin (NEURONTIN) 600 MG tablet Take 1 tablet (600 mg total) by mouth 3  (three) times daily.   . metoprolol (LOPRESSOR) 100 MG tablet Take 100 mg by mouth 2 (two) times daily.    . mirabegron ER (MYRBETRIQ) 50 MG TB24 tablet Take 50 mg by mouth.   . Multiple Vitamins-Minerals (CENTRUM SILVER ULTRA WOMENS PO) Take 1 tablet by mouth daily with lunch.    . nystatin cream (MYCOSTATIN) APPLY  TOPICALLY 2 (TWO) TIMES DAILY. APPLY TO AFFECTED AREA (Patient taking differently: as needed. APPLY  TOPICALLY 2 (TWO) TIMES DAILY. APPLY TO AFFECTED AREA)   . omeprazole (PRILOSEC) 20 MG capsule TAKE ONE CAPSULE DAILY   . potassium chloride (K-DUR) 10 MEQ tablet Take 10 mEq by mouth daily. Reported on 12/24/2015   . ranitidine (ZANTAC) 150 MG tablet TAKE 1 TABLET BY MOUTH TWO TIMES DAILY   . SYNTHROID 50 MCG tablet TAKE 1 TABLET (50 MCG TOTAL) BY MOUTH DAILY BEFORE BREAKFAST.   Marland Kitchen warfarin (COUMADIN) 3 MG tablet Take 1 tablet (3 mg total) by mouth daily.    No facility-administered encounter medications on file as of 06/28/2017.     Allergies  Allergen Reactions  . Iodinated Diagnostic Agents Rash  . Augmentin [Amoxicillin-Pot Clavulanate] Other (See Comments)    Gi distress   . Codeine   . Crestor [Rosuvastatin] Other (See Comments)    Unknown Unknown  . Levaquin [Levofloxacin In D5w]     Stomach pain, body pain/ache all over Stomach pain, body pain/ache all over  . Naproxen Other (See Comments)    unknown unknown  . Prednisone   . Sulfa Antibiotics   . Sulfacetamide Sodium   . Tolectin [Tolmetin]     Review of Systems  Constitutional: Positive for malaise/fatigue.  Eyes: Negative.   Respiratory: Negative.   Cardiovascular: Negative.   Gastrointestinal: Negative.   Musculoskeletal: Positive for joint pain (bruising of toes present).  Skin: Negative.   Neurological: Negative.   Endo/Heme/Allergies: Negative.   Psychiatric/Behavioral: Negative.     Objective:  BP 118/62   Pulse 80   Temp (!) 97.5 F (36.4 C)   Resp 16   Wt 179 lb 3.2 oz (81.3 kg)    BMI 28.07 kg/m   Physical Exam  Constitutional: She is oriented to person, place, and time and well-developed, well-nourished, and in no distress.  HENT:  Head: Normocephalic and atraumatic.  Eyes: Conjunctivae are normal. No scleral icterus.  Neck: No thyromegaly present.  Cardiovascular: Normal rate, regular rhythm and normal heart sounds.  Pulmonary/Chest: Effort normal and breath sounds normal.  Abdominal: Soft.  Musculoskeletal: She exhibits no edema, tenderness or deformity.  5th toe nontender,possible mild laxity of supporting tendons/ligaments.  Neurological: She is alert and oriented to person, place, and time.  Skin: Skin is warm and dry.  Psychiatric: Mood, memory, affect and judgment  normal.    Assessment and Plan :  1. Sprain of fifth toe of left foot, initial encounter Possible dislocation. No Tenderness or deformity. I do not think Xray is necessary at this time. Buddy tape toe at this time.  2. Atrial fibrillation, controlled ([place]) Ordered through labs. - INR/PT  HPI, Exam and A&P transcribed by Tiffany Kocher, RMA under direction and in the presence of Miguel Aschoff, MD.

## 2017-07-07 ENCOUNTER — Ambulatory Visit: Payer: Self-pay

## 2017-07-07 ENCOUNTER — Ambulatory Visit: Payer: Medicare Other

## 2017-07-07 DIAGNOSIS — I4891 Unspecified atrial fibrillation: Secondary | ICD-10-CM

## 2017-07-07 LAB — POCT INR
INR: 2.1
PT: 25.6

## 2017-07-07 NOTE — Progress Notes (Signed)
Description   Continue 4mg  daily. Return in 4 weeks.

## 2017-07-29 ENCOUNTER — Encounter: Payer: Self-pay | Admitting: Oncology

## 2017-07-29 ENCOUNTER — Inpatient Hospital Stay: Payer: Medicare Other

## 2017-07-29 ENCOUNTER — Inpatient Hospital Stay: Payer: Medicare Other | Attending: Oncology | Admitting: Oncology

## 2017-07-29 VITALS — BP 129/72 | HR 75 | Temp 97.8°F | Resp 18 | Wt 178.0 lb

## 2017-07-29 DIAGNOSIS — C49A2 Gastrointestinal stromal tumor of stomach: Secondary | ICD-10-CM

## 2017-07-29 DIAGNOSIS — D696 Thrombocytopenia, unspecified: Secondary | ICD-10-CM | POA: Diagnosis not present

## 2017-07-29 DIAGNOSIS — I509 Heart failure, unspecified: Secondary | ICD-10-CM | POA: Insufficient documentation

## 2017-07-29 DIAGNOSIS — Z85028 Personal history of other malignant neoplasm of stomach: Secondary | ICD-10-CM | POA: Diagnosis present

## 2017-07-29 DIAGNOSIS — E079 Disorder of thyroid, unspecified: Secondary | ICD-10-CM | POA: Diagnosis not present

## 2017-07-29 DIAGNOSIS — K5900 Constipation, unspecified: Secondary | ICD-10-CM | POA: Diagnosis not present

## 2017-07-29 DIAGNOSIS — Z85828 Personal history of other malignant neoplasm of skin: Secondary | ICD-10-CM | POA: Diagnosis not present

## 2017-07-29 DIAGNOSIS — K449 Diaphragmatic hernia without obstruction or gangrene: Secondary | ICD-10-CM | POA: Insufficient documentation

## 2017-07-29 DIAGNOSIS — I4891 Unspecified atrial fibrillation: Secondary | ICD-10-CM | POA: Diagnosis not present

## 2017-07-29 DIAGNOSIS — R197 Diarrhea, unspecified: Secondary | ICD-10-CM | POA: Insufficient documentation

## 2017-07-29 DIAGNOSIS — K219 Gastro-esophageal reflux disease without esophagitis: Secondary | ICD-10-CM | POA: Insufficient documentation

## 2017-07-29 DIAGNOSIS — Z79899 Other long term (current) drug therapy: Secondary | ICD-10-CM | POA: Insufficient documentation

## 2017-07-29 DIAGNOSIS — I110 Hypertensive heart disease with heart failure: Secondary | ICD-10-CM | POA: Diagnosis not present

## 2017-07-29 LAB — CBC WITH DIFFERENTIAL/PLATELET
Basophils Absolute: 0 10*3/uL (ref 0–0.1)
Basophils Relative: 0 %
Eosinophils Absolute: 0 10*3/uL (ref 0–0.7)
Eosinophils Relative: 1 %
HCT: 40.8 % (ref 35.0–47.0)
Hemoglobin: 13.8 g/dL (ref 12.0–16.0)
Lymphocytes Relative: 28 %
Lymphs Abs: 1.9 10*3/uL (ref 1.0–3.6)
MCH: 33.7 pg (ref 26.0–34.0)
MCHC: 33.8 g/dL (ref 32.0–36.0)
MCV: 99.6 fL (ref 80.0–100.0)
Monocytes Absolute: 0.5 10*3/uL (ref 0.2–0.9)
Monocytes Relative: 7 %
Neutro Abs: 4.4 10*3/uL (ref 1.4–6.5)
Neutrophils Relative %: 64 %
Platelets: 144 10*3/uL — ABNORMAL LOW (ref 150–440)
RBC: 4.09 MIL/uL (ref 3.80–5.20)
RDW: 13.3 % (ref 11.5–14.5)
WBC: 6.9 10*3/uL (ref 3.6–11.0)

## 2017-07-29 NOTE — Progress Notes (Addendum)
Hematology/Oncology Consult note [facility]  [phone] [phone]  Patient Care Team: Jerrol Banana., MD as PCP - General (Family Medicine) Margarita Rana, MD as Referring Physician (Family Medicine) Christene Lye, MD (General Surgery)   Name of the patient: Dawn Burnett  [id number]  Mar 27, 1929   Date of visit: 07/29/17  Diagnosis- GIST tumor status post resection.  Chief complaint/ Reason for visit-routine follow-up of gist tumor  Heme/Onc history:  Oncology History   Chief Complaint/Diagnosis:   1. Gastrointestinal stromal tumor, diagnoses by endoscopy.   2. Ultrasound biopsy on May 29, 2009. 3. Resection of the tumor in November, 2010.     SCC (squamous cell carcinoma), face   06/16/2011 Initial Diagnosis    SCC (squamous cell carcinoma), face     Pathology showed 8 cm just tumor involving posterior wall of the stomach. Margins were negative and one lymph node was negative for malignancy. Mitotic rate was 1 per 5 mm. Spindle type low-grade gist  During her visit in June 2017 patient decided to no longer get surveillance CT scans done due to her age and follow up with [place] on a yearly basis to monitor clinical signs and symptoms. CT scan in June 2017 showed stable rounded soft tissue mass 41 x 28 mm in size in the gastrosplenic ligament. No evidence of new peritoneal and mesenteric or retroperitoneal lesion.    Interval history- patient reports on and off constipation alternating with diarrhea. No blood in stools  ECOG PS- 2 Pain scale- 0 Opioid associated constipation- no  Review of systems- Review of Systems  Constitutional: Negative for chills, fever, malaise/fatigue and weight loss.  HENT: Negative for congestion, ear discharge and nosebleeds.   Eyes: Negative for blurred vision.  Respiratory: Negative for cough, hemoptysis, sputum production, shortness of breath and wheezing.   Cardiovascular:  Negative for chest pain, palpitations, orthopnea and claudication.  Gastrointestinal: Positive for constipation and diarrhea. Negative for abdominal pain, blood in stool, heartburn, melena, nausea and vomiting.  Genitourinary: Negative for dysuria, flank pain, frequency, hematuria and urgency.  Musculoskeletal: Negative for back pain, joint pain and myalgias.  Skin: Negative for rash.  Neurological: Negative for dizziness, tingling, focal weakness, seizures, weakness and headaches.  Endo/Heme/Allergies: Does not bruise/bleed easily.  Psychiatric/Behavioral: Negative for depression and suicidal ideas. The patient does not have insomnia.       Allergies  Allergen Reactions  . Iodinated Diagnostic Agents Rash  . Augmentin [Amoxicillin-Pot Clavulanate] Other (See Comments)    Gi distress   . Codeine   . Crestor [Rosuvastatin] Other (See Comments)    Unknown Unknown  . Levaquin [Levofloxacin In D5w]     Stomach pain, body pain/ache all over Stomach pain, body pain/ache all over  . Naproxen Other (See Comments)    unknown unknown  . Prednisone   . Sulfa Antibiotics   . Sulfacetamide Sodium   . Tolectin [Tolmetin]      Past Medical History:  Diagnosis Date  . Arthritis   . Atrial fibrillation ([place])   . CHF (congestive heart failure) ([place])   . GERD (gastroesophageal reflux disease)   . Hiatal hernia   . Hypertension   . Skin cancer, basal cell   . Thyroid disease      Past Surgical History:  Procedure Laterality Date  . ABDOMINAL HYSTERECTOMY    . APPENDECTOMY    . BREAST SURGERY    . cateract extraction    . EYE SURGERY  done by Dr. Vickki Muff  . KNEE SURGERY    . 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 right hip surgery    . right total knee replacement      Social History   Socioeconomic History  . Marital status: Widowed    Spouse name: widowed  . Number of children: 2  . Years of education: 58  . Highest education level:  Not on file  Social Needs  . Financial resource strain: Not on file  . Food insecurity - worry: Not on file  . Food insecurity - inability: Not on file  . Transportation needs - medical: Not on file  . Transportation needs - non-medical: Not on file  Occupational History  . Occupation: retired  Tobacco Use  . Smoking status: Never Smoker  . Smokeless tobacco: Never Used  Substance and Sexual Activity  . Alcohol use: No  . Drug use: No  . Sexual activity: No  Other Topics Concern  . Not on file  Social History Narrative  . Not on file    Family History  Problem Relation Age of Onset  . Heart disease Mother   . Hypertension Mother   . Heart attack Mother   . Stomach cancer Father   . Liver cancer Brother   . Hypertension Son   . Huntington's disease Sister   . Diabetes Sister   . Kidney disease Sister   . Congestive Heart Failure Sister   . Pneumonia Sister   . Stroke Brother   . Parkinson's disease Brother   . Hypertension Brother   . Congestive Heart Failure Brother      Current Outpatient Medications:  .  acetaminophen (TYLENOL) 500 MG tablet, Take 500 mg by mouth every 6 (six) hours as needed for mild pain, moderate pain, fever or headache., Disp: , Rfl:  .  atorvastatin (LIPITOR) 20 MG tablet, Take 20 mg by mouth at bedtime. , Disp: , Rfl:  .  Calcium-Vitamin D 600-200 MG-UNIT per tablet, Take 600 tablets by mouth daily with supper. , Disp: , Rfl:  .  celecoxib (CELEBREX) 200 MG capsule, Take 1 capsule (200 mg total) by mouth daily., Disp: 90 capsule, Rfl: 3 .  COUMADIN 1 MG tablet, TAKE 1 (ONE) TABLET TABLET, ORAL, DAILY AS DIRECTED, Disp: 30 tablet, Rfl: 11 .  COUMADIN 4 MG tablet, TAKE 1 TABLET AS DIRECTED, Disp: 30 tablet, Rfl: 11 .  diltiazem (CARDIZEM CD) 120 MG 24 hr capsule, Take 120 mg by mouth daily., Disp: , Rfl:  .  doxycycline (VIBRA-TABS) 100 MG tablet, Take 1 tablet (100 mg total) by mouth 2 (two) times daily., Disp: 20 tablet, Rfl: 0 .  ENTRESTO  24-26 MG, Take 1 tablet by mouth 2 (two) times daily. , Disp: , Rfl:  .  fluticasone (FLONASE) 50 MCG/ACT nasal spray, Place 2 sprays into both nostrils daily., Disp: 16 g, Rfl: 6 .  furosemide (LASIX) 20 MG tablet, Take 20 mg by mouth daily. , Disp: , Rfl:  .  gabapentin (NEURONTIN) 600 MG tablet, Take 1 tablet (600 mg total) by mouth 3 (three) times daily., Disp: 90 tablet, Rfl: 11 .  metoprolol (LOPRESSOR) 100 MG tablet, Take 100 mg by mouth 2 (two) times daily. , Disp: , Rfl:  .  mirabegron ER (MYRBETRIQ) 50 MG TB24 tablet, Take 50 mg by mouth., Disp: , Rfl:  .  Multiple Vitamins-Minerals (CENTRUM SILVER ULTRA WOMENS PO), Take 1 tablet by mouth daily with lunch. ,  Disp: , Rfl:  .  nystatin cream (MYCOSTATIN), APPLY  TOPICALLY 2 (TWO) TIMES DAILY. APPLY TO AFFECTED AREA (Patient taking differently: as needed. APPLY  TOPICALLY 2 (TWO) TIMES DAILY. APPLY TO AFFECTED AREA), Disp: 30 g, Rfl: 1 .  omeprazole (PRILOSEC) 20 MG capsule, TAKE ONE CAPSULE DAILY, Disp: 90 capsule, Rfl: 3 .  potassium chloride (K-DUR) 10 MEQ tablet, Take 10 mEq by mouth daily. Reported on 12/24/2015, Disp: , Rfl:  .  ranitidine (ZANTAC) 150 MG tablet, TAKE 1 TABLET BY MOUTH TWO TIMES DAILY, Disp: 60 tablet, Rfl: 11 .  SYNTHROID 50 MCG tablet, TAKE 1 TABLET (50 MCG TOTAL) BY MOUTH DAILY BEFORE BREAKFAST., Disp: 90 tablet, Rfl: 3 .  warfarin (COUMADIN) 3 MG tablet, Take 1 tablet (3 mg total) by mouth daily., Disp: 30 tablet, Rfl: 12  Physical exam:  Vitals:   07/29/17 1057 07/29/17 1100  BP:  129/72  Pulse:  75  Resp:  18  Temp:  97.8 F (36.6 C)  TempSrc:  Tympanic  Weight: 178 lb (80.7 kg)    Physical Exam  Constitutional: She is oriented to person, place, and time and well-developed, well-nourished, and in no distress.  HENT:  Head: Normocephalic and atraumatic.  Eyes: EOM are normal. Pupils are equal, round, and reactive to light.  Neck: Normal range of motion.  Cardiovascular: Normal rate, regular rhythm and  normal heart sounds.  Pulmonary/Chest: Effort normal and breath sounds normal.  Abdominal: Soft. Bowel sounds are normal.  Neurological: She is alert and oriented to person, place, and time.  Skin: Skin is warm and dry.     CMP Latest Ref Rng & Units 03/31/2017  Glucose 65 - 99 mg/dL 137(H)  BUN 6 - 20 mg/dL 24(H)  Creatinine 0.44 - 1.00 mg/dL 0.89  Sodium 135 - 145 mmol/L 143  Potassium 3.5 - 5.1 mmol/L 4.2  Chloride 101 - 111 mmol/L 107  CO2 22 - 32 mmol/L 28  Calcium 8.9 - 10.3 mg/dL 9.6  Total Protein 6.5 - 8.1 g/dL 6.8  Total Bilirubin 0.3 - 1.2 mg/dL 0.8  Alkaline Phos 38 - 126 U/L 40  AST 15 - 41 U/L 20  ALT 14 - 54 U/L 15   CBC Latest Ref Rng & Units 07/29/2017  WBC 3.6 - 11.0 K/uL 6.9  Hemoglobin 12.0 - 16.0 g/dL 13.8  Hematocrit 35.0 - 47.0 % 40.8  Platelets 150 - 440 K/uL 144(L)      Assessment and plan- Patient is a 83 y.o. female with a history of gastric gist tumor low-grade 8 cm involving the posterior wall of the stomach status post resection in 2010 who is here for routine yearly follow-up  Clinically patient is doing well and there are no signs and symptoms of abdominal pain or unintentional weight loss.  CBC done today is normal except for mild chronic thrombocytopenia.  No anemia.  Given her age routine surveillance imaging is currently not planned. Last CT abdomen from June 2017 showed a 4 cm soft tissue mass in the gastrohepatic ligament which was overall stable as compared to before.  I doubt that that is contributing to her constipation and diarrhea.  I will hold off on a repeat CT scan at this time since gist tumors are typically slow growing and she is not a surgical candidate at this time. I will see her back in 1 years time with a CBC with differential  With regards to constipat alternating with diarrhea we will touch base with [facility] clinic.  She has seen Dr. Vira Agar before   Visit Diagnosis 1. Gastrointestinal stromal tumor (GIST) of stomach ([place])       Dr. Randa Evens, MD, MPH [place] [facility] Pager- [phone] 07/29/2017 1:17 PM

## 2017-07-30 ENCOUNTER — Ambulatory Visit: Payer: Medicare Other | Admitting: Oncology

## 2017-07-30 ENCOUNTER — Telehealth: Payer: Self-pay | Admitting: *Deleted

## 2017-07-30 ENCOUNTER — Other Ambulatory Visit: Payer: Medicare Other

## 2017-07-30 NOTE — Telephone Encounter (Signed)
Called GI to check on if patient had ever been to the GI clinic before.  Patient was last seen by Dawson Bills.  I let the secretary know that the patient was having periods of diarrhea alternating with constipation and Dr. Janese Banks would like her to be seen.  I was told to fax and Dr. Elroy Channel note and Dawson Bills would be back next week and she would review it and let us know if patient can have an appointment..  Faxed notes to GI [phone] and will await their response

## 2017-08-04 ENCOUNTER — Ambulatory Visit: Payer: Medicare Other

## 2017-08-04 DIAGNOSIS — I4891 Unspecified atrial fibrillation: Secondary | ICD-10-CM

## 2017-08-04 LAB — POCT INR
INR: 3.4
PT: 41.3

## 2017-08-04 MED ORDER — WARFARIN SODIUM 3 MG PO TABS: 3.0000 mg | ORAL_TABLET | Freq: Every day | ORAL | 12 refills | Status: DC

## 2017-08-04 NOTE — Patient Instructions (Signed)
Description   Alternate 4 mg and 3 mg daily. Recheck in 2 weeks.

## 2017-08-18 ENCOUNTER — Ambulatory Visit: Payer: Medicare Other

## 2017-08-18 DIAGNOSIS — I4891 Unspecified atrial fibrillation: Secondary | ICD-10-CM | POA: Diagnosis not present

## 2017-08-18 LAB — POCT INR
INR: 2.5
PT: 29.9

## 2017-08-18 NOTE — Patient Instructions (Signed)
Description   Alternate 3 mg and 4 mg daily. Recheck in 3 weeks.

## 2017-08-24 ENCOUNTER — Ambulatory Visit: Payer: Self-pay | Admitting: Family Medicine

## 2017-08-26 ENCOUNTER — Ambulatory Visit: Payer: Medicare Other | Admitting: Podiatry

## 2017-09-06 ENCOUNTER — Other Ambulatory Visit: Payer: Self-pay | Admitting: Family Medicine

## 2017-09-06 ENCOUNTER — Ambulatory Visit: Payer: Medicare Other | Admitting: Podiatry

## 2017-09-06 DIAGNOSIS — R159 Full incontinence of feces: Secondary | ICD-10-CM

## 2017-09-06 DIAGNOSIS — R152 Fecal urgency: Secondary | ICD-10-CM | POA: Insufficient documentation

## 2017-09-07 ENCOUNTER — Ambulatory Visit: Payer: Medicare Other | Admitting: Family Medicine

## 2017-09-07 VITALS — BP 110/50 | HR 76 | Temp 97.8°F | Resp 14 | Wt 177.2 lb

## 2017-09-07 DIAGNOSIS — I10 Essential (primary) hypertension: Secondary | ICD-10-CM

## 2017-09-07 DIAGNOSIS — E1121 Type 2 diabetes mellitus with diabetic nephropathy: Secondary | ICD-10-CM | POA: Diagnosis not present

## 2017-09-07 DIAGNOSIS — E039 Hypothyroidism, unspecified: Secondary | ICD-10-CM

## 2017-09-07 DIAGNOSIS — M720 Palmar fascial fibromatosis [Dupuytren]: Secondary | ICD-10-CM

## 2017-09-07 DIAGNOSIS — K5909 Other constipation: Secondary | ICD-10-CM | POA: Diagnosis not present

## 2017-09-07 DIAGNOSIS — I4891 Unspecified atrial fibrillation: Secondary | ICD-10-CM

## 2017-09-07 LAB — POCT INR
INR: 2
PT: 23.8

## 2017-09-07 LAB — POCT GLYCOSYLATED HEMOGLOBIN (HGB A1C): Hemoglobin A1C: 6.9

## 2017-09-07 NOTE — Progress Notes (Signed)
Dawn Burnett  MRN: [id number] DOB: Dec 23, 1928  Subjective:  HPI  Patient is here for routine follow up. Last routine office visit was on 04/20/2017. Diabetes: patient is not checking her her sugar at home, never have done. Lab Results  Component Value Date   HGBA1C 7.7 04/20/2017   Wt Readings from Last 3 Encounters:  09/07/17 177 lb 3.2 oz (80.4 kg)  07/29/17 178 lb (80.7 kg)  06/28/17 179 lb 3.2 oz (81.3 kg)   Coumadin management: Last INR checked was on 08/18/2017. Result was 2.5. Patient alternating 3 mg and 4 mg. Patient noticing changes in palms of both hands.No pain.  Patient Active Problem List   Diagnosis Date Noted  . Incontinence of feces with fecal urgency 09/06/2017  . Osteoarthritis of knee 03/03/2017  . Type 2 diabetes mellitus with diabetic nephropathy, without long-term current use of insulin ([place]) 09/01/2016  . Gastrointestinal stromal tumor (GIST) of stomach ([place]) 12/21/2014  . Allergic rhinitis 12/07/2014  . Arthritis 12/07/2014  . Atrial fibrillation, controlled ([place]) 12/07/2014  . Arteriosclerosis of coronary artery 12/07/2014  . Essential (primary) hypertension 12/07/2014  . Acid reflux 12/07/2014  . History of prolonged Q-T interval on ECG 12/07/2014  . Blood glucose elevated 12/07/2014  . Adult hypothyroidism 12/07/2014  . Urinary incontinence 12/07/2014  . Neuropathy 12/07/2014  . Adiposity 12/07/2014  . Osteopenia 12/07/2014  . HLD (hyperlipidemia) 12/07/2014  . Alteration in bowel elimination: incontinence 12/07/2014  . Hiatal hernia   . Atrial fibrillation ([place])   . Basal cell carcinoma of ear 11/14/2014  . Bladder infection, chronic 06/01/2012  . Urethral caruncle 06/01/2012  . SCC (squamous cell carcinoma), face 06/16/2011  . ABDOMINAL MASS 05/16/2009  . NONSPECIFIC ABN FINDING RAD & OTH EXAM GI TRACT 04/24/2009    Past Medical History:  Diagnosis Date  . Arthritis   . Atrial fibrillation ([place])   . CHF (congestive heart failure)  ([place])   . GERD (gastroesophageal reflux disease)   . Hiatal hernia   . Hypertension   . Skin cancer, basal cell   . Thyroid disease     Social History   Socioeconomic History  . Marital status: Widowed    Spouse name: widowed  . Number of children: 2  . Years of education: 31  . Highest education level: Not on file  Social Needs  . Financial resource strain: Not on file  . Food insecurity - worry: Not on file  . Food insecurity - inability: Not on file  . Transportation needs - medical: Not on file  . Transportation needs - non-medical: Not on file  Occupational History  . Occupation: retired  Tobacco Use  . Smoking status: Never Smoker  . Smokeless tobacco: Never Used  Substance and Sexual Activity  . Alcohol use: No  . Drug use: No  . Sexual activity: No  Other Topics Concern  . Not on file  Social History Narrative  . Not on file    Outpatient Encounter Medications as of 09/07/2017  Medication Sig Note  . acetaminophen (TYLENOL) 500 MG tablet Take 500 mg by mouth every 6 (six) hours as needed for mild pain, moderate pain, fever or headache.   Marland Kitchen atorvastatin (LIPITOR) 20 MG tablet Take 20 mg by mouth at bedtime.    . Calcium-Vitamin D 600-200 MG-UNIT per tablet Take 600 tablets by mouth daily with supper.    . celecoxib (CELEBREX) 200 MG capsule Take 1 capsule (200 mg total) by mouth daily.   Marland Kitchen COUMADIN  1 MG tablet TAKE 1 (ONE) TABLET TABLET, ORAL, DAILY AS DIRECTED   . COUMADIN 4 MG tablet TAKE 1 TABLET AS DIRECTED 12/17/2016: Takes 5mg  MWF, 4mg  rest of the days  . diltiazem (CARDIZEM CD) 120 MG 24 hr capsule Take 120 mg by mouth daily.   Marland Kitchen ENTRESTO 24-26 MG Take 1 tablet by mouth 2 (two) times daily.    . fluticasone (FLONASE) 50 MCG/ACT nasal spray Place 2 sprays into both nostrils daily.   . furosemide (LASIX) 20 MG tablet Take 20 mg by mouth daily.    Marland Kitchen gabapentin (NEURONTIN) 600 MG tablet Take 1 tablet (600 mg total) by mouth 3 (three) times daily.   Marland Kitchen  loperamide (IMODIUM) 2 MG capsule Take by mouth.   . metoprolol (LOPRESSOR) 100 MG tablet Take 100 mg by mouth 2 (two) times daily.    . mirabegron ER (MYRBETRIQ) 50 MG TB24 tablet Take 50 mg by mouth.   . Multiple Vitamins-Minerals (CENTRUM SILVER ULTRA WOMENS PO) Take 1 tablet by mouth daily with lunch.    . nystatin cream (MYCOSTATIN) APPLY  TOPICALLY 2 (TWO) TIMES DAILY. APPLY TO AFFECTED AREA (Patient taking differently: as needed. APPLY  TOPICALLY 2 (TWO) TIMES DAILY. APPLY TO AFFECTED AREA)   . omeprazole (PRILOSEC) 20 MG capsule TAKE ONE CAPSULE DAILY   . ranitidine (ZANTAC) 150 MG tablet TAKE 1 TABLET BY MOUTH TWO TIMES DAILY   . SYNTHROID 50 MCG tablet TAKE 1 TABLET (50 MCG TOTAL) BY MOUTH DAILY BEFORE BREAKFAST.   Marland Kitchen warfarin (COUMADIN) 3 MG tablet Take 1 tablet (3 mg total) by mouth daily.    No facility-administered encounter medications on file as of 09/07/2017.     Allergies  Allergen Reactions  . Iodinated Diagnostic Agents Rash  . Augmentin [Amoxicillin-Pot Clavulanate] Other (See Comments)    Gi distress   . Codeine   . Crestor [Rosuvastatin] Other (See Comments)    Unknown Unknown  . Levaquin [Levofloxacin In D5w]     Stomach pain, body pain/ache all over Stomach pain, body pain/ache all over  . Naproxen Other (See Comments)    unknown unknown  . Prednisone   . Sulfa Antibiotics   . Sulfacetamide Sodium   . Tolectin [Tolmetin]     Review of Systems  Constitutional: Positive for malaise/fatigue.  HENT: Negative.   Eyes: Negative.   Respiratory: Negative.   Cardiovascular: Negative.   Gastrointestinal: Positive for abdominal pain, constipation and diarrhea.       Bloated  Musculoskeletal: Positive for joint pain. Negative for back pain and neck pain.       Stiffness  Skin: Negative.   Neurological: Positive for tingling and weakness.       Unsteady gait  Endo/Heme/Allergies: Negative.   Psychiatric/Behavioral: Negative.     Objective:  BP (!) 110/50    Pulse 76   Temp 97.8 F (36.6 C)   Resp 14   Wt 177 lb 3.2 oz (80.4 kg)   BMI 27.75 kg/m   Physical Exam  Constitutional: She is oriented to person, place, and time and well-developed, well-nourished, and in no distress.  HENT:  Head: Normocephalic and atraumatic.  Eyes: Conjunctivae are normal. Pupils are equal, round, and reactive to light.  Neck: No thyromegaly present.  Cardiovascular: Normal rate, regular rhythm, normal heart sounds and intact distal pulses. Exam reveals no gallop.  No murmur heard. Pulmonary/Chest: Effort normal and breath sounds normal. No respiratory distress. She has no wheezes.  Abdominal: There is no tenderness.  Musculoskeletal:  Using a walker to ambulate  Neurological: She is alert and oriented to person, place, and time.    Assessment and Plan :  1. Type 2 diabetes mellitus with diabetic nephropathy, without long-term current use of insulin ([facility]) 6.9 today. Better. - POCT HgB A1C  2. Atrial fibrillation, controlled ([place]) 2.0 today. Stable. Continue alternating 3 mg and 4 mg. Re check in 2 weeks. - POCT INR  3. Chronic constipation Following gastroenterologist for this.  4. Dupuytren's contracture of both hands Discussed this in detail. Patient advised to follow at this time only. This should not be causing the pain in her hands, pain is probably coming from arthritis and possibly neuropathy. 5. Adult hypothyroidism  6. Essential (primary) hypertension 7.OA  HPI, Exam and A&P transcribed by Tiffany Kocher, RMA under direction and in the presence of Miguel Aschoff, MD.

## 2017-09-08 ENCOUNTER — Ambulatory Visit: Payer: Self-pay | Admitting: Family Medicine

## 2017-09-09 DIAGNOSIS — Z8509 Personal history of malignant neoplasm of other digestive organs: Secondary | ICD-10-CM | POA: Diagnosis present

## 2017-09-16 ENCOUNTER — Encounter: Payer: Self-pay | Admitting: Podiatry

## 2017-09-16 ENCOUNTER — Ambulatory Visit: Payer: Medicare Other | Admitting: Podiatry

## 2017-09-16 DIAGNOSIS — B351 Tinea unguium: Secondary | ICD-10-CM | POA: Diagnosis not present

## 2017-09-16 DIAGNOSIS — M79676 Pain in unspecified toe(s): Secondary | ICD-10-CM | POA: Diagnosis not present

## 2017-09-16 DIAGNOSIS — E1142 Type 2 diabetes mellitus with diabetic polyneuropathy: Secondary | ICD-10-CM

## 2017-09-16 DIAGNOSIS — M2011 Hallux valgus (acquired), right foot: Secondary | ICD-10-CM

## 2017-09-16 DIAGNOSIS — D689 Coagulation defect, unspecified: Secondary | ICD-10-CM

## 2017-09-16 NOTE — Progress Notes (Signed)
Complaint:  Visit Type: Patient returns to my office for continued preventative foot care services. Complaint: Patient states" my nails have grown long and thick and become painful to walk and wear shoes" Patient has been diagnosed with hyperglycemia with neuropathy.. The patient presents for preventative foot care services. No changes to ROS  Podiatric Exam: Vascular: dorsalis pedis and posterior tibial pulses are palpable bilateral. Capillary return is immediate. Temperature gradient is WNL. Skin turgor WNL  Dorsal skin lesions due to vascular. Sensorium: Normal Semmes Weinstein monofilament test. Normal tactile sensation bilaterally. Nail Exam: Pt has thick disfigured discolored nails with subungual debris noted bilateral entire nail hallux through fifth toenails Ulcer Exam: There is no evidence of ulcer or pre-ulcerative changes or infection. Orthopedic Exam: Muscle tone and strength are WNL. No limitations in general ROM. No crepitus or effusions noted. Foot type and digits show no abnormalities.  HAV  B/L Skin: No Porokeratosis. No infection or ulcers  Diagnosis:  Onychomycosis, , Pain in right toe, pain in left toes  Treatment & Plan Procedures and Treatment: Consent by patient was obtained for treatment procedures. The patient understood the discussion of treatment and procedures well. All questions were answered thoroughly reviewed. Debridement of mycotic and hypertrophic toenails, 1 through 5 bilateral and clearing of subungual debris. No ulceration, no infection noted.  Return Visit-Office Procedure: Patient instructed to return to the office for a follow up visit 3 months for continued evaluation and treatment.    Noreta Kue DPM 

## 2017-09-22 ENCOUNTER — Ambulatory Visit: Payer: Medicare Other

## 2017-09-22 DIAGNOSIS — I4891 Unspecified atrial fibrillation: Secondary | ICD-10-CM | POA: Diagnosis not present

## 2017-09-22 LAB — POCT INR
INR: 1.7
PT: 20

## 2017-09-22 NOTE — Patient Instructions (Signed)
Description   Start 4 mg daily. Recheck in 2 weeks.

## 2017-09-24 ENCOUNTER — Encounter: Payer: Self-pay | Admitting: Physician Assistant

## 2017-09-24 ENCOUNTER — Ambulatory Visit: Payer: Medicare Other | Admitting: Physician Assistant

## 2017-09-24 VITALS — BP 136/60 | HR 72 | Temp 97.8°F | Resp 16

## 2017-09-24 DIAGNOSIS — G629 Polyneuropathy, unspecified: Secondary | ICD-10-CM | POA: Diagnosis not present

## 2017-09-24 DIAGNOSIS — M720 Palmar fascial fibromatosis [Dupuytren]: Secondary | ICD-10-CM | POA: Diagnosis not present

## 2017-09-24 NOTE — Progress Notes (Signed)
Patient: Dawn Burnett Female    DOB: [birth year]   84 y.o.   MRN: [id number] Visit Date: 09/24/2017  Today's Provider: Mar Daring, PA-C   Chief Complaint  Patient presents with  . Hand Pain   Subjective:    HPI Pt is here today for bilateral hand pain. She reports that she has had this for a while but it has gotten worse. She reports that they are achy and keep her up at night. She has been taking tylenol of the pain and helps some but she only gets about 4 hours of sleep. She has knots on the palms of her hands and her right index finger is disfigured from arthritis. She has known neuropathy. Pt reports that warm water from when she washes dishes helps. She has no known injuries and has not been doing any thing repetitive with her hands.      Allergies  Allergen Reactions  . Iodinated Diagnostic Agents Rash  . Augmentin [Amoxicillin-Pot Clavulanate] Other (See Comments)    Gi distress   . Codeine   . Crestor [Rosuvastatin] Other (See Comments)    Unknown Unknown  . Levaquin [Levofloxacin In D5w]     Stomach pain, body pain/ache all over Stomach pain, body pain/ache all over  . Naproxen Other (See Comments)    unknown unknown  . Prednisone   . Sulfa Antibiotics   . Sulfacetamide Sodium   . Tolectin [Tolmetin]      Current Outpatient Medications:  .  acetaminophen (TYLENOL) 500 MG tablet, Take 500 mg by mouth every 6 (six) hours as needed for mild pain, moderate pain, fever or headache., Disp: , Rfl:  .  atorvastatin (LIPITOR) 20 MG tablet, Take 20 mg by mouth at bedtime. , Disp: , Rfl:  .  Calcium-Vitamin D 600-200 MG-UNIT per tablet, Take 600 tablets by mouth daily with supper. , Disp: , Rfl:  .  celecoxib (CELEBREX) 200 MG capsule, Take 1 capsule (200 mg total) by mouth daily., Disp: 90 capsule, Rfl: 3 .  COUMADIN 1 MG tablet, TAKE 1 (ONE) TABLET TABLET, ORAL, DAILY AS DIRECTED, Disp: 30 tablet, Rfl: 11 .  COUMADIN 4 MG tablet, TAKE 1 TABLET AS  DIRECTED, Disp: 30 tablet, Rfl: 11 .  diltiazem (CARDIZEM CD) 120 MG 24 hr capsule, Take 120 mg by mouth daily., Disp: , Rfl:  .  ENTRESTO 24-26 MG, Take 1 tablet by mouth 2 (two) times daily. , Disp: , Rfl:  .  fluticasone (FLONASE) 50 MCG/ACT nasal spray, Place 2 sprays into both nostrils daily., Disp: 16 g, Rfl: 6 .  furosemide (LASIX) 20 MG tablet, Take 20 mg by mouth daily. , Disp: , Rfl:  .  gabapentin (NEURONTIN) 600 MG tablet, Take 1 tablet (600 mg total) by mouth 3 (three) times daily., Disp: 90 tablet, Rfl: 11 .  loperamide (IMODIUM) 2 MG capsule, Take by mouth., Disp: , Rfl:  .  metoprolol (LOPRESSOR) 100 MG tablet, Take 100 mg by mouth 2 (two) times daily. , Disp: , Rfl:  .  mirabegron ER (MYRBETRIQ) 50 MG TB24 tablet, Take 50 mg by mouth., Disp: , Rfl:  .  Multiple Vitamins-Minerals (CENTRUM SILVER ULTRA WOMENS PO), Take 1 tablet by mouth daily with lunch. , Disp: , Rfl:  .  nystatin cream (MYCOSTATIN), APPLY  TOPICALLY 2 (TWO) TIMES DAILY. APPLY TO AFFECTED AREA (Patient taking differently: as needed. APPLY  TOPICALLY 2 (TWO) TIMES DAILY. APPLY TO AFFECTED AREA), Disp: 30 g,  Rfl: 1 .  omeprazole (PRILOSEC) 20 MG capsule, TAKE ONE CAPSULE DAILY, Disp: 90 capsule, Rfl: 2 .  ranitidine (ZANTAC) 150 MG tablet, TAKE 1 TABLET BY MOUTH TWO TIMES DAILY, Disp: 180 tablet, Rfl: 10 .  SYNTHROID 50 MCG tablet, TAKE 1 TABLET (50 MCG TOTAL) BY MOUTH DAILY BEFORE BREAKFAST., Disp: 90 tablet, Rfl: 3 .  warfarin (COUMADIN) 3 MG tablet, Take 1 tablet (3 mg total) by mouth daily., Disp: 30 tablet, Rfl: 12  Review of Systems  Constitutional: Negative.   HENT: Negative.   Eyes: Negative.   Respiratory: Negative.   Cardiovascular: Negative.   Gastrointestinal: Negative.   Endocrine: Negative.   Genitourinary: Negative.   Musculoskeletal: Positive for arthralgias.  Skin: Negative.   Allergic/Immunologic: Negative.   Neurological: Negative.   Hematological: Negative.   Psychiatric/Behavioral:  Negative.     Social History   Tobacco Use  . Smoking status: Never Smoker  . Smokeless tobacco: Never Used  Substance Use Topics  . Alcohol use: No   Objective:   BP 136/60 (BP Location: Left Arm, Patient Position: Sitting, Cuff Size: Normal)   Pulse 72   Temp 97.8 F (36.6 C) (Oral)   Resp 16  Vitals:   09/24/17 1512  BP: 136/60  Pulse: 72  Resp: 16  Temp: 97.8 F (36.6 C)  TempSrc: Oral     Physical Exam  Constitutional: She appears well-developed and well-nourished. No distress.  Neck: Normal range of motion. Neck supple. No JVD present. No tracheal deviation present. No thyromegaly present.  Cardiovascular: Normal rate, regular rhythm and normal heart sounds. Exam reveals no gallop and no friction rub.  No murmur heard. Pulmonary/Chest: Effort normal and breath sounds normal. No respiratory distress. She has no wheezes. She has no rales.  Musculoskeletal:       Right wrist: Normal.       Right hand: She exhibits deformity (athritic changes). She exhibits normal range of motion, no tenderness, no bony tenderness, normal capillary refill and no swelling. Decreased sensation (all fingertips) noted. Normal strength noted.       Left hand: She exhibits deformity (some athritic changes noted). She exhibits normal range of motion, no tenderness, no bony tenderness, normal two-point discrimination, normal capillary refill and no swelling. Decreased sensation (all fingertips) noted. Normal strength noted.  Lymphadenopathy:    She has no cervical adenopathy.  Skin: She is not diaphoretic.  Vitals reviewed.     Assessment & Plan:     1. Neuropathy Suspect worsening neuropathy with arthritic changes also causing pains. Negative tinel and phalen testing today so less likely for carpal tunnel. Continue gabapentin as prescribed. Do not want to increase and risk falls. Patient unable to have steroid injections due to adverse reaction when having one in her knee as well as being  diabetic. We will try conservative treatment with epsom salt soaks, consider night splints, and tumeric for inflammation. If all fail may consider increasing gabapentin at that time. May also consider referral to Dr. Peggye Ley, ortho, for further evaluation and possible consideration of release of a1 pulley tendon sheath to prevent possible contractures. She is to call if conservative managements fail.   2. Dupuytren's contracture of both hands See above medical treatment plan.       Mar Daring, PA-C  [facility]

## 2017-09-24 NOTE — Patient Instructions (Signed)
Epsom salt soaks with 1 cup of epsom salt in warm water in a large pot. Soak hands for 10-15 minutes once or twice daily.  Cock-up wrist splints for both hands. Wear through the night. Can remove each morning.   Tumeric is a good natural anti-inflammatory as well that will not increase bleeding risk with your coumadin. If interested I would recommend taking [phone] mg daily for arthritic pains.   Dupuytren Contracture Dupuytren contracture is a condition in which tissue under the skin of the palm becomes abnormally thickened. This causes one or more of the fingers to curl inward (contract) toward the palm. Eventually, the fingers may not be able to straighten out. This condition affects some or all of the fingers and the palm of the hand. It is often passed along from parent to child (inherited). Dupuytren contracture is a long-term (chronic) condition that develops (progresses) slowly over time. There is no cure, but symptoms can be managed and progression can be slowed with treatment. This condition is usually not dangerous or painful, but it can interfere with everyday tasks. What are the causes? This condition is caused by tissue (fascia) in the palm getting thicker and tighter. When the fascia thickens, it pulls on the cords of tissue (tendons) that control finger movement. This causes the fingers to contract. The cause of fascia thickening is not known. What increases the risk? This condition may be more likely to develop in:  People who are age 58 or older.  Men.  People with a family history of this condition.  People who use tobacco products, including cigarettes, chewing tobacco, and e-cigarettes.  People who drink alcohol excessively.  People with diabetes.  People with autoimmune diseases, such as HIV.  People with seizure disorders.  What are the signs or symptoms? Symptoms may develop in one or both hands. Any of the fingers can contract. The fingers farthest from the  thumb are commonly affected. Usually, this condition is painless. You may have discomfort when holding or grabbing objects. Early symptoms of this condition may include:  Thick, puckered skin on the hand.  One or more lumps (nodules) on the palm. Nodules may be tender when they first appear, but they are generally painless.  Symptoms of this condition develop slowly over months or years. Later symptoms of this condition may include:  Thick cords of tissue in the palm.  Fingers curled up toward the palm.  Inability to straighten the fingers into their normal position.  How is this diagnosed? This condition is diagnosed with a physical exam, which may include:  Looking at your hands and feeling your hands. This is to check for thickened fascia and nodules.  Measuring finger motion.  Doing the [employer]. You may be asked to try to put your hand on a surface, with your palm down and your fingers straight out.  How is this treated? There is no cure for this condition, but treatment can make symptoms more manageable and relieve discomfort. Treatment options may include:  Physical therapy. This can strengthen your hand and increase flexibility.  Occupational therapy. This can help you with everyday tasks that may be more difficult because of your condition.  A hand splint.  Shots (injections). Substances may be injected into your hand, such as: ? Medicines that help to decrease swelling (corticosteroids). ? Proteins (collagenase) to weaken thick tissue. After a collagenase injection, your health care provider may stretch your fingers.  Needle aponeurotomy. In this procedure, a needle is pushed  through the skin and into the fascia. Moving the needle against the fascia can weaken or break up the thick tissue.  Surgery. This may be needed if your condition causes discomfort or interferes with everyday activities. Physical therapy is usually needed after surgery.  In some  cases, symptoms never develop to the point of needing major treatment, and caring for yourself at home can be enough to manage your condition. Symptoms often return after treatment. Follow these instructions at home: If you have a splint:  Do not put pressure on any part of the splint until it is fully hardened. This may take several hours.  Wear the splint as told by your health care provider. Remove it only as told by your health care provider.  Loosen the splint if your fingers tingle, become numb, or turn cold and blue.  Do not let your splint get wet if it is not waterproof. ? If your splint is not waterproof, cover it with a watertight covering when you take a bath or a shower. ? Do not take baths, swim, or use a hot tub until your health care provider approves. Ask your health care provider if you can take showers. You may only be allowed to take sponge baths for bathing.  Keep the splint clean.  Ask your health care provider when it is safe to drive. Hand Care  Take these actions to help protect your hand from possible injury: ? Use tools that have padded grips. ? Wear protective gloves while you work with your hands. ? Avoid repetitive hand movements.  Avoid actions that cause pain or discomfort.  Stretch your hand by gently pulling your fingers backward toward your wrist. Do this as often as is comfortable. Stop if this causes pain.  Gently massage your hand as often as is comfortable.  If directed, apply heat to the affected area as often as told by your health care provider. Use the heat source that your health care provider recommends, such as a moist heat pack or a heating pad. ? Place a towel between your skin and the heat source. ? Leave the heat on for 20-30 minutes. ? Remove the heat if your skin turns bright red. This is especially important if you are unable to feel pain, heat, or cold. You may have a greater risk of getting burned. General instructions  Take  over-the-counter and prescription medicines only as told by your health care provider.  Manage any other conditions that you have, such as diabetes.  If physical therapy was prescribed, do exercises as told by your health care provider.  Keep all follow-up visits as told by your health care provider. This is important. Contact a health care provider if:  You develop new symptoms, or your symptoms get worse.  You have pain that gets worse or does not get better with medicine.  You have difficulty or discomfort with everyday tasks.  You have problems with your splint.  You develop numbness or tingling. Get help right away if:  You have severe pain.  Your fingers change color or become unusually cold. This information is not intended to replace advice given to you by your health care provider. Make sure you discuss any questions you have with your health care provider. Document Released: 05/24/2009 Document Revised: 09/10/2015 Document Reviewed: 12/19/2014 Elsevier Interactive Patient Education  [employer].

## 2017-09-30 ENCOUNTER — Ambulatory Visit: Payer: Medicare Other | Admitting: Family Medicine

## 2017-09-30 VITALS — BP 150/82 | HR 68 | Temp 97.9°F | Resp 14 | Wt 177.0 lb

## 2017-09-30 DIAGNOSIS — B029 Zoster without complications: Secondary | ICD-10-CM | POA: Diagnosis not present

## 2017-09-30 DIAGNOSIS — L03211 Cellulitis of face: Secondary | ICD-10-CM | POA: Diagnosis not present

## 2017-09-30 MED ORDER — VALACYCLOVIR HCL 1 G PO TABS: 1000.0000 mg | ORAL_TABLET | Freq: Two times a day (BID) | ORAL | 0 refills | Status: DC

## 2017-09-30 MED ORDER — CEPHALEXIN 500 MG PO CAPS
500.0000 mg | ORAL_CAPSULE | Freq: Two times a day (BID) | ORAL | 0 refills | Status: DC
Start: 2017-09-30 — End: 2017-12-23

## 2017-09-30 NOTE — Progress Notes (Signed)
Dawn Burnett  MRN: [id number] DOB: 1928-10-31  Subjective:  HPI   The patient is an 84 year old female who presents for evaluation of right ear pain and right arm rash.   She states that she woke up this morning with pounding in her ear.  She has not had any fever or drainage form the ear.  She does have some sinus congestion. The patient also complains of a rash on her right antecubital area.  There is one larger lesion and then more smaller ones that go up the arm.  She has had no drainage, but has had itching.  She states that she had been seen last Friday by the PA for hand and arm pain.  She was told she had neuropathy.  The patient reports soaking the arm in Epsom salt and Turmeric.  Patient Active Problem List   Diagnosis Date Noted  . Incontinence of feces with fecal urgency 09/06/2017  . Osteoarthritis of knee 03/03/2017  . Type 2 diabetes mellitus with diabetic nephropathy, without long-term current use of insulin ([place]) 09/01/2016  . Gastrointestinal stromal tumor (GIST) of stomach ([place]) 12/21/2014  . Allergic rhinitis 12/07/2014  . Arthritis 12/07/2014  . Atrial fibrillation, controlled ([place]) 12/07/2014  . Arteriosclerosis of coronary artery 12/07/2014  . Essential (primary) hypertension 12/07/2014  . Acid reflux 12/07/2014  . History of prolonged Q-T interval on ECG 12/07/2014  . Blood glucose elevated 12/07/2014  . Adult hypothyroidism 12/07/2014  . Urinary incontinence 12/07/2014  . Neuropathy 12/07/2014  . Adiposity 12/07/2014  . Osteopenia 12/07/2014  . HLD (hyperlipidemia) 12/07/2014  . Alteration in bowel elimination: incontinence 12/07/2014  . Hiatal hernia   . Atrial fibrillation ([place])   . Basal cell carcinoma of ear 11/14/2014  . Bladder infection, chronic 06/01/2012  . Urethral caruncle 06/01/2012  . SCC (squamous cell carcinoma), face 06/16/2011  . ABDOMINAL MASS 05/16/2009  . NONSPECIFIC ABN FINDING RAD & OTH EXAM GI TRACT 04/24/2009    Past Medical  History:  Diagnosis Date  . Arthritis   . Atrial fibrillation ([place])   . CHF (congestive heart failure) ([place])   . GERD (gastroesophageal reflux disease)   . Hiatal hernia   . Hypertension   . Skin cancer, basal cell   . Thyroid disease     Social History   Socioeconomic History  . Marital status: Widowed    Spouse name: widowed  . Number of children: 2  . Years of education: 48  . Highest education level: Not on file  Social Needs  . Financial resource strain: Not on file  . Food insecurity - worry: Not on file  . Food insecurity - inability: Not on file  . Transportation needs - medical: Not on file  . Transportation needs - non-medical: Not on file  Occupational History  . Occupation: retired  Tobacco Use  . Smoking status: Never Smoker  . Smokeless tobacco: Never Used  Substance and Sexual Activity  . Alcohol use: No  . Drug use: No  . Sexual activity: No  Other Topics Concern  . Not on file  Social History Narrative  . Not on file    Outpatient Encounter Medications as of 09/30/2017  Medication Sig Note  . acetaminophen (TYLENOL) 500 MG tablet Take 500 mg by mouth every 6 (six) hours as needed for mild pain, moderate pain, fever or headache.   Marland Kitchen atorvastatin (LIPITOR) 20 MG tablet Take 20 mg by mouth at bedtime.    . Calcium-Vitamin D 600-200  MG-UNIT per tablet Take 600 tablets by mouth daily with supper.    . celecoxib (CELEBREX) 200 MG capsule Take 1 capsule (200 mg total) by mouth daily.   Marland Kitchen COUMADIN 1 MG tablet TAKE 1 (ONE) TABLET TABLET, ORAL, DAILY AS DIRECTED   . COUMADIN 4 MG tablet TAKE 1 TABLET AS DIRECTED 12/17/2016: Takes 5mg  MWF, 4mg  rest of the days  . diltiazem (CARDIZEM CD) 120 MG 24 hr capsule Take 120 mg by mouth daily.   Marland Kitchen ENTRESTO 24-26 MG Take 1 tablet by mouth 2 (two) times daily.    . fluticasone (FLONASE) 50 MCG/ACT nasal spray Place 2 sprays into both nostrils daily.   . furosemide (LASIX) 20 MG tablet Take 20 mg by mouth daily.    Marland Kitchen  gabapentin (NEURONTIN) 600 MG tablet Take 1 tablet (600 mg total) by mouth 3 (three) times daily.   Marland Kitchen loperamide (IMODIUM) 2 MG capsule Take by mouth.   . metoprolol (LOPRESSOR) 100 MG tablet Take 100 mg by mouth 2 (two) times daily.    . mirabegron ER (MYRBETRIQ) 50 MG TB24 tablet Take 50 mg by mouth.   . Multiple Vitamins-Minerals (CENTRUM SILVER ULTRA WOMENS PO) Take 1 tablet by mouth daily with lunch.    . nystatin cream (MYCOSTATIN) APPLY  TOPICALLY 2 (TWO) TIMES DAILY. APPLY TO AFFECTED AREA (Patient taking differently: as needed. APPLY  TOPICALLY 2 (TWO) TIMES DAILY. APPLY TO AFFECTED AREA)   . omeprazole (PRILOSEC) 20 MG capsule TAKE ONE CAPSULE DAILY   . ranitidine (ZANTAC) 150 MG tablet TAKE 1 TABLET BY MOUTH TWO TIMES DAILY   . SYNTHROID 50 MCG tablet TAKE 1 TABLET (50 MCG TOTAL) BY MOUTH DAILY BEFORE BREAKFAST.   Marland Kitchen warfarin (COUMADIN) 3 MG tablet Take 1 tablet (3 mg total) by mouth daily.    No facility-administered encounter medications on file as of 09/30/2017.     Allergies  Allergen Reactions  . Iodinated Diagnostic Agents Rash  . Augmentin [Amoxicillin-Pot Clavulanate] Other (See Comments)    Gi distress   . Codeine   . Crestor [Rosuvastatin] Other (See Comments)    Unknown Unknown  . Levaquin [Levofloxacin In D5w]     Stomach pain, body pain/ache all over Stomach pain, body pain/ache all over  . Naproxen Other (See Comments)    unknown unknown  . Prednisone   . Sulfa Antibiotics   . Sulfacetamide Sodium   . Tolectin [Tolmetin]     Review of Systems  Constitutional: Positive for malaise/fatigue. Negative for fever.  HENT: Negative.   Eyes: Negative.   Respiratory: Negative for cough, shortness of breath and wheezing.   Cardiovascular: Negative for chest pain and palpitations.  Gastrointestinal: Negative.   Genitourinary: Negative.   Musculoskeletal: Positive for joint pain.  Skin: Positive for itching and rash.  Neurological: Positive for weakness.    Endo/Heme/Allergies: Negative.   Psychiatric/Behavioral: Negative.     Objective:  BP (!) 150/82 (BP Location: Left Arm, Patient Position: Sitting, Cuff Size: Normal)   Pulse 68   Temp 97.9 F (36.6 C) (Oral)   Resp 14   Wt 177 lb (80.3 kg)   BMI 27.72 kg/m   Physical Exam  Constitutional: She is oriented to person, place, and time and well-developed, well-nourished, and in no distress.  HENT:  Head: Normocephalic and atraumatic.  Right Ear: External ear normal.  Left Ear: External ear normal.  Nose: Nose normal.  Eyes: Conjunctivae are normal.  Cardiovascular: Normal rate, regular rhythm and normal heart sounds.  Pulmonary/Chest: Effort normal and breath sounds normal.  Abdominal: Soft.  Lymphadenopathy:    She has cervical adenopathy.  Neurological: She is alert and oriented to person, place, and time.  Skin: Skin is warm and dry. Rash noted.  Mild vesicular rash down right arm to elbow. Right preauricular swelling and erythema with mild tenderness.    Assessment and Plan :  1. Herpes zoster without complication F/u for pain--post herpetic neuralgia. - cephALEXin (KEFLEX) 500 MG capsule; Take 1 capsule (500 mg total) by mouth 2 (two) times daily.  Dispense: 10 capsule; Refill: 0 - valACYclovir (VALTREX) 1000 MG tablet; Take 1 tablet (1,000 mg total) by mouth 2 (two) times daily.  Dispense: 20 tablet; Refill: 0 2.Cellulitis of face  I have done the exam and reviewed the chart and it is accurate to the best of my knowledge. Development worker, community has been used and  any errors in dictation or transcription are unintentional. Miguel Aschoff M.D. [facility]

## 2017-10-06 ENCOUNTER — Ambulatory Visit: Payer: Medicare Other

## 2017-10-06 DIAGNOSIS — I4891 Unspecified atrial fibrillation: Secondary | ICD-10-CM | POA: Diagnosis not present

## 2017-10-06 LAB — POCT INR
INR: 2.3
Prothrombin Time: 27.9

## 2017-10-06 NOTE — Patient Instructions (Signed)
Description   continue 4 mg daily. Recheck in 1 month.

## 2017-10-07 ENCOUNTER — Telehealth: Payer: Self-pay | Admitting: Family Medicine

## 2017-10-07 NOTE — Telephone Encounter (Signed)
Pt request Dawn Burnett return her call. Pt stated it's about the shingles vaccine and her kidneys. Please advise. Thanks TNP

## 2017-10-08 ENCOUNTER — Ambulatory Visit: Payer: Self-pay

## 2017-11-03 ENCOUNTER — Ambulatory Visit: Payer: Medicare Other

## 2017-11-03 DIAGNOSIS — I4891 Unspecified atrial fibrillation: Secondary | ICD-10-CM

## 2017-11-03 LAB — POCT INR
INR: 2.5
PT: 29.5

## 2017-11-03 NOTE — Patient Instructions (Signed)
Description   Continue 4 mg daily. Recheck in 1 month.

## 2017-12-01 ENCOUNTER — Ambulatory Visit: Payer: Medicare Other

## 2017-12-01 DIAGNOSIS — I4891 Unspecified atrial fibrillation: Secondary | ICD-10-CM

## 2017-12-01 LAB — POCT INR
INR: 2.5
Prothrombin Time: 30

## 2017-12-01 NOTE — Patient Instructions (Signed)
Description   Hold for one day than resume dose at 4mg  qd, return in 3-4 weeks

## 2017-12-05 ENCOUNTER — Other Ambulatory Visit: Payer: Self-pay | Admitting: Family Medicine

## 2017-12-09 ENCOUNTER — Ambulatory Visit: Payer: Medicare Other | Admitting: Family Medicine

## 2017-12-09 ENCOUNTER — Encounter: Payer: Self-pay | Admitting: Family Medicine

## 2017-12-09 VITALS — BP 134/62 | HR 80 | Temp 98.6°F | Resp 16 | Wt 172.0 lb

## 2017-12-09 DIAGNOSIS — E1121 Type 2 diabetes mellitus with diabetic nephropathy: Secondary | ICD-10-CM

## 2017-12-09 DIAGNOSIS — R5383 Other fatigue: Secondary | ICD-10-CM

## 2017-12-09 DIAGNOSIS — M791 Myalgia, unspecified site: Secondary | ICD-10-CM

## 2017-12-09 DIAGNOSIS — G629 Polyneuropathy, unspecified: Secondary | ICD-10-CM | POA: Diagnosis not present

## 2017-12-09 DIAGNOSIS — C49A2 Gastrointestinal stromal tumor of stomach: Secondary | ICD-10-CM

## 2017-12-09 DIAGNOSIS — I10 Essential (primary) hypertension: Secondary | ICD-10-CM | POA: Diagnosis not present

## 2017-12-09 DIAGNOSIS — E7849 Other hyperlipidemia: Secondary | ICD-10-CM | POA: Diagnosis not present

## 2017-12-09 DIAGNOSIS — E039 Hypothyroidism, unspecified: Secondary | ICD-10-CM

## 2017-12-09 DIAGNOSIS — I509 Heart failure, unspecified: Secondary | ICD-10-CM | POA: Diagnosis not present

## 2017-12-09 NOTE — Progress Notes (Signed)
Patient: Dawn Burnett Female    DOB: 1929/07/22   84 y.o.   MRN: [id number] Visit Date: 12/09/2017  Today's Provider: [name], MD   Chief Complaint  Patient presents with  . Follow-up   Subjective:    HPI Pt is here today for a follow up. She reports that her singles have completely healed. She is not feeling well today. She did not get a good reports from the heart doctor this week. He wants to start her on digoxin and she is not wanting to do that. She is due for labs, as it has been a year since she has had them done. She is not fasting today.  Her last A1c was 09/07/17 and was 6.9. She does not check her blood sugar at home.  She says she feels "weak as water." Her legs feel weak.      Allergies  Allergen Reactions  . Iodinated Diagnostic Agents Rash  . Augmentin [Amoxicillin-Pot Clavulanate] Other (See Comments)    Gi distress   . Codeine   . Crestor [Rosuvastatin] Other (See Comments)    Unknown Unknown  . Levaquin [Levofloxacin In D5w]     Stomach pain, body pain/ache all over Stomach pain, body pain/ache all over  . Naproxen Other (See Comments)    unknown unknown  . Prednisone   . Sulfa Antibiotics   . Sulfacetamide Sodium   . Tolectin [Tolmetin]      Current Outpatient Medications:  .  acetaminophen (TYLENOL) 500 MG tablet, Take 500 mg by mouth every 6 (six) hours as needed for mild pain, moderate pain, fever or headache., Disp: , Rfl:  .  atorvastatin (LIPITOR) 20 MG tablet, Take 20 mg by mouth at bedtime. , Disp: , Rfl:  .  Calcium-Vitamin D 600-200 MG-UNIT per tablet, Take 600 tablets by mouth daily with supper. , Disp: , Rfl:  .  celecoxib (CELEBREX) 200 MG capsule, Take 1 capsule (200 mg total) by mouth daily., Disp: 90 capsule, Rfl: 3 .  cephALEXin (KEFLEX) 500 MG capsule, Take 1 capsule (500 mg total) by mouth 2 (two) times daily., Disp: 10 capsule, Rfl: 0 .  COUMADIN 1 MG tablet, TAKE 1 (ONE) TABLET TABLET, ORAL, DAILY AS DIRECTED,  Disp: 30 tablet, Rfl: 11 .  COUMADIN 4 MG tablet, TAKE 1 TABLET AS DIRECTED, Disp: 30 tablet, Rfl: 11 .  diltiazem (CARDIZEM CD) 120 MG 24 hr capsule, Take 120 mg by mouth daily., Disp: , Rfl:  .  ENTRESTO 24-26 MG, Take 1 tablet by mouth 2 (two) times daily. , Disp: , Rfl:  .  fluticasone (FLONASE) 50 MCG/ACT nasal spray, Place 2 sprays into both nostrils daily., Disp: 16 g, Rfl: 6 .  furosemide (LASIX) 20 MG tablet, Take 20 mg by mouth daily. , Disp: , Rfl:  .  gabapentin (NEURONTIN) 600 MG tablet, Take 1 tablet (600 mg total) by mouth 3 (three) times daily., Disp: 90 tablet, Rfl: 11 .  loperamide (IMODIUM) 2 MG capsule, Take by mouth., Disp: , Rfl:  .  metoprolol (LOPRESSOR) 100 MG tablet, Take 100 mg by mouth 2 (two) times daily. , Disp: , Rfl:  .  mirabegron ER (MYRBETRIQ) 50 MG TB24 tablet, Take 50 mg by mouth., Disp: , Rfl:  .  Multiple Vitamins-Minerals (CENTRUM SILVER ULTRA WOMENS PO), Take 1 tablet by mouth daily with lunch. , Disp: , Rfl:  .  nystatin cream (MYCOSTATIN), APPLY  TOPICALLY 2 (TWO) TIMES DAILY. APPLY TO  AFFECTED AREA (Patient taking differently: as needed. APPLY  TOPICALLY 2 (TWO) TIMES DAILY. APPLY TO AFFECTED AREA), Disp: 30 g, Rfl: 1 .  omeprazole (PRILOSEC) 20 MG capsule, TAKE ONE CAPSULE DAILY, Disp: 90 capsule, Rfl: 1 .  ranitidine (ZANTAC) 150 MG tablet, TAKE 1 TABLET BY MOUTH TWO TIMES DAILY, Disp: 180 tablet, Rfl: 10 .  SYNTHROID 50 MCG tablet, TAKE 1 TABLET (50 MCG TOTAL) BY MOUTH DAILY BEFORE BREAKFAST., Disp: 90 tablet, Rfl: 3 .  valACYclovir (VALTREX) 1000 MG tablet, Take 1 tablet (1,000 mg total) by mouth 2 (two) times daily., Disp: 20 tablet, Rfl: 0 .  warfarin (COUMADIN) 3 MG tablet, Take 1 tablet (3 mg total) by mouth daily., Disp: 30 tablet, Rfl: 12  Review of Systems  Constitutional: Negative.   HENT: Negative.   Eyes: Negative.   Respiratory: Negative.   Cardiovascular: Negative.   Gastrointestinal: Negative.   Endocrine: Negative.     Genitourinary: Negative.   Musculoskeletal: Negative.   Skin: Negative.   Allergic/Immunologic: Negative.   Neurological: Negative.   Hematological: Negative.   Psychiatric/Behavioral: Negative.     Social History   Tobacco Use  . Smoking status: Never Smoker  . Smokeless tobacco: Never Used  Substance Use Topics  . Alcohol use: No   Objective:   BP 134/62 (BP Location: Left Arm, Patient Position: Sitting, Cuff Size: Normal)   Pulse 80   Temp 98.6 F (37 C) (Oral)   Resp 16   Wt 172 lb (78 kg)   SpO2 97%   BMI 26.94 kg/m  Vitals:   12/09/17 0950  BP: 134/62  Pulse: 80  Resp: 16  Temp: 98.6 F (37 C)  TempSrc: Oral  SpO2: 97%  Weight: 172 lb (78 kg)     Physical Exam  Constitutional: She is oriented to person, place, and time. She appears well-developed and well-nourished.  HENT:  Head: Normocephalic and atraumatic.  Eyes: Conjunctivae are normal. No scleral icterus.  Neck: No thyromegaly present.  Cardiovascular: Normal rate, regular rhythm and normal heart sounds.  Pulmonary/Chest: Effort normal and breath sounds normal.  Abdominal: Soft.  Neurological: She is alert and oriented to person, place, and time.  Skin: Skin is warm and dry.  Psychiatric: She has a normal mood and affect. Her behavior is normal. Judgment and thought content normal.        Assessment & Plan:     1. Essential (primary) hypertension  - CBC with Differential/Platelet - TSH  2. Adult hypothyroidism   3. Neuropathy  - Vitamin B12  4. Type 2 diabetes mellitus with diabetic nephropathy, without long-term current use of insulin ([facility])  - Hemoglobin A1c  5. Chronic congestive heart failure, unspecified heart failure type ([facility])  - Pro b natriuretic peptide (BNP)  6. Myalgia  - CK - Sedimentation rate  7. Gastrointestinal stromal tumor (GIST) of stomach ([place])   8. Other hyperlipidemia  - Comprehensive metabolic panel  9. Other fatigue R/o CHF. - CBC with  Differential/Platelet - Vitamin B12 RTC 2-3 months. 10.GIST      [name], MD  [facility]

## 2017-12-10 ENCOUNTER — Telehealth: Payer: Self-pay

## 2017-12-10 ENCOUNTER — Telehealth: Payer: Self-pay | Admitting: Family Medicine

## 2017-12-10 LAB — CBC WITH DIFFERENTIAL/PLATELET
Basophils Absolute: 0 10*3/uL (ref 0.0–0.2)
Basos: 0 %
EOS (ABSOLUTE): 0.1 10*3/uL (ref 0.0–0.4)
Eos: 1 %
Hematocrit: 39.9 % (ref 34.0–46.6)
Hemoglobin: 13.7 g/dL (ref 11.1–15.9)
Immature Grans (Abs): 0 10*3/uL (ref 0.0–0.1)
Immature Granulocytes: 0 %
Lymphocytes Absolute: 1.7 10*3/uL (ref 0.7–3.1)
Lymphs: 22 %
MCH: 33.4 pg — ABNORMAL HIGH (ref 26.6–33.0)
MCHC: 34.3 g/dL (ref 31.5–35.7)
MCV: 97 fL (ref 79–97)
Monocytes Absolute: 0.4 10*3/uL (ref 0.1–0.9)
Monocytes: 6 %
Neutrophils Absolute: 5.5 10*3/uL (ref 1.4–7.0)
Neutrophils: 71 %
Platelets: 162 10*3/uL (ref 150–379)
RBC: 4.1 x10E6/uL (ref 3.77–5.28)
RDW: 13.8 % (ref 12.3–15.4)
WBC: 7.7 10*3/uL (ref 3.4–10.8)

## 2017-12-10 LAB — COMPREHENSIVE METABOLIC PANEL
ALT: 15 IU/L (ref 0–32)
AST: 19 IU/L (ref 0–40)
Albumin/Globulin Ratio: 2.2 (ref 1.2–2.2)
Albumin: 4.3 g/dL (ref 3.5–4.7)
Alkaline Phosphatase: 63 IU/L (ref 39–117)
BUN/Creatinine Ratio: 21 (ref 12–28)
BUN: 20 mg/dL (ref 8–27)
Bilirubin Total: 0.7 mg/dL (ref 0.0–1.2)
CO2: 27 mmol/L (ref 20–29)
Calcium: 9.5 mg/dL (ref 8.7–10.3)
Chloride: 101 mmol/L (ref 96–106)
Creatinine, Ser: 0.95 mg/dL (ref 0.57–1.00)
GFR calc Af Amer: 62 mL/min/{1.73_m2} (ref >59)
GFR calc non Af Amer: 54 mL/min/{1.73_m2} — ABNORMAL LOW (ref >59)
Globulin, Total: 2 g/dL (ref 1.5–4.5)
Glucose: 164 mg/dL — ABNORMAL HIGH (ref 65–99)
Potassium: 4.5 mmol/L (ref 3.5–5.2)
Sodium: 143 mmol/L (ref 134–144)
Total Protein: 6.3 g/dL (ref 6.0–8.5)

## 2017-12-10 LAB — TSH: TSH: 0.875 u[IU]/mL (ref 0.450–4.500)

## 2017-12-10 LAB — HEMOGLOBIN A1C
Est. average glucose Bld gHb Est-mCnc: 169 mg/dL
Hgb A1c MFr Bld: 7.5 % — ABNORMAL HIGH (ref 4.8–5.6)

## 2017-12-10 LAB — CK: Total CK: 46 U/L (ref 24–173)

## 2017-12-10 LAB — VITAMIN B12: Vitamin B-12: 564 pg/mL (ref 232–1245)

## 2017-12-10 LAB — PRO B NATRIURETIC PEPTIDE: NT-Pro BNP: 565 pg/mL (ref 0–738)

## 2017-12-10 LAB — SEDIMENTATION RATE: Sed Rate: 2 mm/hr (ref 0–40)

## 2017-12-10 NOTE — Telephone Encounter (Signed)
Scheduled for 12/29/17

## 2017-12-10 NOTE — Telephone Encounter (Signed)
-----   Message from Jerrol Banana., MD sent at 12/10/2017 10:11 AM EDT ----- Labs ok.

## 2017-12-10 NOTE — Telephone Encounter (Signed)
LM for pt to CB and schedule AWV. -MM 

## 2017-12-10 NOTE — Telephone Encounter (Signed)
Pt called wanting a nurse to call her back.  She has more questions about her labwork that she had done yesterday.  Thanks [employer]

## 2017-12-10 NOTE — Telephone Encounter (Signed)
Advised patient of results.  

## 2017-12-10 NOTE — Telephone Encounter (Signed)
Pt returned the call. -MM

## 2017-12-13 NOTE — Telephone Encounter (Signed)
Advised 

## 2017-12-16 ENCOUNTER — Ambulatory Visit: Payer: Medicare Other | Admitting: Podiatry

## 2017-12-16 NOTE — Telephone Encounter (Signed)
Noted, thank you.  -MM 

## 2017-12-20 ENCOUNTER — Telehealth: Payer: Self-pay

## 2017-12-20 NOTE — Telephone Encounter (Signed)
Tried calling pt back, no answer and no vm. 

## 2017-12-20 NOTE — Telephone Encounter (Signed)
Patient called saying that she has had diarrhea for the last 2 days. She has taken Imodium capsules that has only helped for 1-2 hours. She reports that she was talking with a friend, and they recommended that she take Diphenoxylate to help stop the diarrhea. Patient normally sees Dr Rosanna Randy, however she would like this sent in today. Please advise. Pharmacy listed is correct. Thanks!

## 2017-12-20 NOTE — Telephone Encounter (Signed)
Needs o.v. Can see PA

## 2017-12-21 NOTE — Telephone Encounter (Signed)
Appointment made

## 2017-12-23 ENCOUNTER — Ambulatory Visit: Payer: Medicare Other | Admitting: Family Medicine

## 2017-12-23 VITALS — BP 148/72 | HR 74 | Temp 98.1°F | Resp 16 | Wt 172.0 lb

## 2017-12-23 DIAGNOSIS — I509 Heart failure, unspecified: Secondary | ICD-10-CM | POA: Diagnosis not present

## 2017-12-23 DIAGNOSIS — E1121 Type 2 diabetes mellitus with diabetic nephropathy: Secondary | ICD-10-CM | POA: Diagnosis not present

## 2017-12-23 DIAGNOSIS — I4891 Unspecified atrial fibrillation: Secondary | ICD-10-CM | POA: Diagnosis not present

## 2017-12-23 DIAGNOSIS — R152 Fecal urgency: Secondary | ICD-10-CM

## 2017-12-23 DIAGNOSIS — R159 Full incontinence of feces: Secondary | ICD-10-CM | POA: Diagnosis not present

## 2017-12-23 MED ORDER — DIPHENOXYLATE-ATROPINE 2.5-0.025 MG PO TABS: 1.0000 | ORAL_TABLET | Freq: Every day | ORAL | 0 refills | Status: DC | PRN

## 2017-12-23 NOTE — Progress Notes (Signed)
Dawn Burnett  MRN: [id number] DOB: June 22, 1929  Subjective:  HPI   The patient is an 84 year old female who presents for evaluation of diarrhea.  The patient states that for about 4-6 weeks she has been having explosive diarrhea 1-2 times daily.  She states that she is unable to control the bowels.   She reports some lower abdominal pain but no fever or blood in the stool.  She denies it is watery. This is a chronic problem--she has seen GI. This is impacting her ability to function socially.  Patient Active Problem List   Diagnosis Date Noted  . Incontinence of feces with fecal urgency 09/06/2017  . Osteoarthritis of knee 03/03/2017  . Type 2 diabetes mellitus with diabetic nephropathy, without long-term current use of insulin ([place]) 09/01/2016  . Gastrointestinal stromal tumor (GIST) of stomach ([place]) 12/21/2014  . Allergic rhinitis 12/07/2014  . Arthritis 12/07/2014  . Atrial fibrillation, controlled ([place]) 12/07/2014  . Arteriosclerosis of coronary artery 12/07/2014  . Essential (primary) hypertension 12/07/2014  . Acid reflux 12/07/2014  . History of prolonged Q-T interval on ECG 12/07/2014  . Blood glucose elevated 12/07/2014  . Adult hypothyroidism 12/07/2014  . Urinary incontinence 12/07/2014  . Neuropathy 12/07/2014  . Adiposity 12/07/2014  . Osteopenia 12/07/2014  . HLD (hyperlipidemia) 12/07/2014  . Alteration in bowel elimination: incontinence 12/07/2014  . Hiatal hernia   . Atrial fibrillation ([place])   . Basal cell carcinoma of ear 11/14/2014  . Bladder infection, chronic 06/01/2012  . Urethral caruncle 06/01/2012  . SCC (squamous cell carcinoma), face 06/16/2011  . ABDOMINAL MASS 05/16/2009  . NONSPECIFIC ABN FINDING RAD & OTH EXAM GI TRACT 04/24/2009    Past Medical History:  Diagnosis Date  . Arthritis   . Atrial fibrillation ([place])   . CHF (congestive heart failure) ([place])   . GERD (gastroesophageal reflux disease)   . Hiatal hernia   . Hypertension   .  Skin cancer, basal cell   . Thyroid disease     Social History   Socioeconomic History  . Marital status: Widowed    Spouse name: widowed  . Number of children: 2  . Years of education: 32  . Highest education level: Not on file  Occupational History  . Occupation: retired  Scientific laboratory technician  . Financial resource strain: Not on file  . Food insecurity:    Worry: Not on file    Inability: Not on file  . Transportation needs:    Medical: Not on file    Non-medical: Not on file  Tobacco Use  . Smoking status: Never Smoker  . Smokeless tobacco: Never Used  Substance and Sexual Activity  . Alcohol use: No  . Drug use: No  . Sexual activity: Never  Lifestyle  . Physical activity:    Days per week: Not on file    Minutes per session: Not on file  . Stress: Not on file  Relationships  . Social connections:    Talks on phone: Not on file    Gets together: Not on file    Attends religious service: Not on file    Active member of club or organization: Not on file    Attends meetings of clubs or organizations: Not on file    Relationship status: Not on file  . Intimate partner violence:    Fear of current or ex partner: Not on file    Emotionally abused: Not on file    Physically abused: Not on file  Forced sexual activity: Not on file  Other Topics Concern  . Not on file  Social History Narrative  . Not on file    Outpatient Encounter Medications as of 12/23/2017  Medication Sig Note  . acetaminophen (TYLENOL) 500 MG tablet Take 500 mg by mouth every 6 (six) hours as needed for mild pain, moderate pain, fever or headache.   Marland Kitchen atorvastatin (LIPITOR) 20 MG tablet Take 20 mg by mouth at bedtime.    . Calcium-Vitamin D 600-200 MG-UNIT per tablet Take 600 tablets by mouth daily with supper.    . celecoxib (CELEBREX) 200 MG capsule Take 1 capsule (200 mg total) by mouth daily.   Marland Kitchen COUMADIN 1 MG tablet TAKE 1 (ONE) TABLET TABLET, ORAL, DAILY AS DIRECTED   . COUMADIN 4 MG tablet  TAKE 1 TABLET AS DIRECTED 12/17/2016: Takes 5mg  MWF, 4mg  rest of the days  . diltiazem (CARDIZEM CD) 120 MG 24 hr capsule Take 120 mg by mouth daily.   Marland Kitchen ENTRESTO 24-26 MG Take 1 tablet by mouth 2 (two) times daily.    . furosemide (LASIX) 20 MG tablet Take 20 mg by mouth daily.    Marland Kitchen gabapentin (NEURONTIN) 600 MG tablet Take 1 tablet (600 mg total) by mouth 3 (three) times daily.   Marland Kitchen loperamide (IMODIUM) 2 MG capsule Take by mouth.   . metoprolol (LOPRESSOR) 100 MG tablet Take 100 mg by mouth 2 (two) times daily.    . mirabegron ER (MYRBETRIQ) 50 MG TB24 tablet Take 50 mg by mouth.   . Multiple Vitamins-Minerals (CENTRUM SILVER ULTRA WOMENS PO) Take 1 tablet by mouth daily with lunch.    . nystatin cream (MYCOSTATIN) APPLY  TOPICALLY 2 (TWO) TIMES DAILY. APPLY TO AFFECTED AREA (Patient taking differently: as needed. APPLY  TOPICALLY 2 (TWO) TIMES DAILY. APPLY TO AFFECTED AREA)   . omeprazole (PRILOSEC) 20 MG capsule TAKE ONE CAPSULE DAILY   . ranitidine (ZANTAC) 150 MG tablet TAKE 1 TABLET BY MOUTH TWO TIMES DAILY   . SYNTHROID 50 MCG tablet TAKE 1 TABLET (50 MCG TOTAL) BY MOUTH DAILY BEFORE BREAKFAST.   Marland Kitchen warfarin (COUMADIN) 3 MG tablet Take 1 tablet (3 mg total) by mouth daily.   . [DISCONTINUED] cephALEXin (KEFLEX) 500 MG capsule Take 1 capsule (500 mg total) by mouth 2 (two) times daily. (Patient not taking: Reported on 12/09/2017)   . [DISCONTINUED] fluticasone (FLONASE) 50 MCG/ACT nasal spray Place 2 sprays into both nostrils daily. (Patient not taking: Reported on 12/09/2017)   . [DISCONTINUED] valACYclovir (VALTREX) 1000 MG tablet Take 1 tablet (1,000 mg total) by mouth 2 (two) times daily. (Patient not taking: Reported on 12/09/2017)    No facility-administered encounter medications on file as of 12/23/2017.     Allergies  Allergen Reactions  . Iodinated Diagnostic Agents Rash  . Augmentin [Amoxicillin-Pot Clavulanate] Other (See Comments)    Gi distress   . Codeine   . Crestor  [Rosuvastatin] Other (See Comments)    Unknown Unknown  . Levaquin [Levofloxacin In D5w]     Stomach pain, body pain/ache all over Stomach pain, body pain/ache all over  . Naproxen Other (See Comments)    unknown unknown  . Prednisone   . Sulfa Antibiotics   . Sulfacetamide Sodium   . Tolectin [Tolmetin]     Review of Systems  Constitutional: Positive for malaise/fatigue. Negative for fever.  Eyes: Negative.   Respiratory: Negative for cough, shortness of breath and wheezing.   Cardiovascular: Positive for leg swelling. Negative  for chest pain and palpitations.  Gastrointestinal: Positive for abdominal pain and diarrhea. Negative for blood in stool, constipation, heartburn, melena, nausea and vomiting.  Genitourinary: Negative.   Skin: Negative.   Endo/Heme/Allergies: Negative.   Psychiatric/Behavioral: Negative.     Objective:  BP (!) 148/72 (BP Location: Right Arm, Patient Position: Sitting, Cuff Size: Normal)   Pulse 74   Temp 98.1 F (36.7 C) (Oral)   Resp 16   Wt 172 lb (78 kg)   BMI 26.94 kg/m   Physical Exam  Constitutional: She is oriented to person, place, and time and well-developed, well-nourished, and in no distress.  HENT:  Head: Normocephalic and atraumatic.  Eyes: Conjunctivae are normal. No scleral icterus.  Neck: No thyromegaly present.  Cardiovascular: Normal rate, regular rhythm and normal heart sounds.  Pulmonary/Chest: Effort normal and breath sounds normal.  Abdominal: Soft. She exhibits no distension. There is no tenderness. There is no guarding.  Musculoskeletal: She exhibits no edema.  Neurological: She is alert and oriented to person, place, and time. Gait normal. GCS score is 15.  Skin: Skin is warm and dry.  Psychiatric: Mood, memory, affect and judgment normal.    Assessment and Plan :  1. Incontinence of feces with fecal urgency Consider stool for O and P. - diphenoxylate-atropine (LOMOTIL) 2.5-0.025 MG tablet; Take 1 tablet by mouth  daily as needed for diarrhea or loose stools.  Dispense: 14 tablet; Refill: 0 2.Chronic CHF On Entresto. 3.TIIDM 4.Afib  I have done the exam and reviewed the chart and it is accurate to the best of my knowledge. Development worker, community has been used and  any errors in dictation or transcription are unintentional. Miguel Aschoff M.D. [facility]

## 2017-12-25 DIAGNOSIS — I509 Heart failure, unspecified: Secondary | ICD-10-CM | POA: Insufficient documentation

## 2017-12-29 ENCOUNTER — Ambulatory Visit: Payer: Medicare Other | Admitting: Family Medicine

## 2017-12-29 ENCOUNTER — Ambulatory Visit: Payer: Self-pay

## 2017-12-29 ENCOUNTER — Ambulatory Visit (INDEPENDENT_AMBULATORY_CARE_PROVIDER_SITE_OTHER): Payer: Medicare Other

## 2017-12-29 VITALS — BP 146/62 | HR 76 | Temp 98.6°F | Ht 67.0 in | Wt 173.2 lb

## 2017-12-29 DIAGNOSIS — I4891 Unspecified atrial fibrillation: Secondary | ICD-10-CM

## 2017-12-29 DIAGNOSIS — M1990 Unspecified osteoarthritis, unspecified site: Secondary | ICD-10-CM | POA: Diagnosis not present

## 2017-12-29 DIAGNOSIS — Z0000 Encounter for general adult medical examination without abnormal findings: Secondary | ICD-10-CM

## 2017-12-29 DIAGNOSIS — E1121 Type 2 diabetes mellitus with diabetic nephropathy: Secondary | ICD-10-CM

## 2017-12-29 DIAGNOSIS — I509 Heart failure, unspecified: Secondary | ICD-10-CM

## 2017-12-29 LAB — POCT INR
INR: 2.6 (ref 2.0–3.0)
PT: 30.9

## 2017-12-29 NOTE — Patient Instructions (Signed)
Dawn Burnett , Thank you for taking time to come for your Medicare Wellness Visit. I appreciate your ongoing commitment to your health goals. Please review the following plan we discussed and let me know if I can assist you in the future.   Screening recommendations/referrals: Colonoscopy: N/A Mammogram: N/A Bone Density: Up to date Recommended yearly ophthalmology/optometry visit for glaucoma screening and checkup Recommended yearly dental visit for hygiene and checkup  Vaccinations: Influenza vaccine: Up to date Pneumococcal vaccine: Up to date Tdap vaccine: Pt declines today.  Shingles vaccine: Pt declines today.     Advanced directives: Advance directive discussed with you today. Even though you declined this today please call our office should you change your mind and we can give you the proper paperwork for you to fill out.  Conditions/risks identified: Recommend increasing water intake to 4-6 glasses a day.   Next appointment: 11:40 AM today with Dr Rosanna Randy.   Preventive Care 58 Years and Older, Female Preventive care refers to lifestyle choices and visits with your health care provider that can promote health and wellness. What does preventive care include?  A yearly physical exam. This is also called an annual well check.  Dental exams once or twice a year.  Routine eye exams. Ask your health care provider how often you should have your eyes checked.  Personal lifestyle choices, including:  Daily care of your teeth and gums.  Regular physical activity.  Eating a healthy diet.  Avoiding tobacco and drug use.  Limiting alcohol use.  Practicing safe sex.  Taking low-dose aspirin every day.  Taking vitamin and mineral supplements as recommended by your health care provider. What happens during an annual well check? The services and screenings done by your health care provider during your annual well check will depend on your age, overall health, lifestyle risk  factors, and family history of disease. Counseling  Your health care provider may ask you questions about your:  Alcohol use.  Tobacco use.  Drug use.  Emotional well-being.  Home and relationship well-being.  Sexual activity.  Eating habits.  History of falls.  Memory and ability to understand (cognition).  Work and work Statistician.  Reproductive health. Screening  You may have the following tests or measurements:  Height, weight, and BMI.  Blood pressure.  Lipid and cholesterol levels. These may be checked every 5 years, or more frequently if you are over 13 years old.  Skin check.  Lung cancer screening. You may have this screening every year starting at age 33 if you have a 30-pack-year history of smoking and currently smoke or have quit within the past 15 years.  Fecal occult blood test (FOBT) of the stool. You may have this test every year starting at age 87.  Flexible sigmoidoscopy or colonoscopy. You may have a sigmoidoscopy every 5 years or a colonoscopy every 10 years starting at age 10.  Hepatitis C blood test.  Hepatitis B blood test.  Sexually transmitted disease (STD) testing.  Diabetes screening. This is done by checking your blood sugar (glucose) after you have not eaten for a while (fasting). You may have this done every 1-3 years.  Bone density scan. This is done to screen for osteoporosis. You may have this done starting at age 54.  Mammogram. This may be done every 1-2 years. Talk to your health care provider about how often you should have regular mammograms. Talk with your health care provider about your test results, treatment options, and if necessary, the  need for more tests. Vaccines  Your health care provider may recommend certain vaccines, such as:  Influenza vaccine. This is recommended every year.  Tetanus, diphtheria, and acellular pertussis (Tdap, Td) vaccine. You may need a Td booster every 10 years.  Zoster vaccine. You  may need this after age 70.  Pneumococcal 13-valent conjugate (PCV13) vaccine. One dose is recommended after age 36.  Pneumococcal polysaccharide (PPSV23) vaccine. One dose is recommended after age 22. Talk to your health care provider about which screenings and vaccines you need and how often you need them. This information is not intended to replace advice given to you by your health care provider. Make sure you discuss any questions you have with your health care provider. Document Released: 08/23/2015 Document Revised: 04/15/2016 Document Reviewed: 05/28/2015 Elsevier Interactive Patient Education  2017 [place] Prevention in the Home Falls can cause injuries. They can happen to people of all ages. There are many things you can do to make your home safe and to help prevent falls. What can I do on the outside of my home?  Regularly fix the edges of walkways and driveways and fix any cracks.  Remove anything that might make you trip as you walk through a door, such as a raised step or threshold.  Trim any bushes or trees on the path to your home.  Use bright outdoor lighting.  Clear any walking paths of anything that might make someone trip, such as rocks or tools.  Regularly check to see if handrails are loose or broken. Make sure that both sides of any steps have handrails.  Any raised decks and porches should have guardrails on the edges.  Have any leaves, snow, or ice cleared regularly.  Use sand or salt on walking paths during winter.  Clean up any spills in your garage right away. This includes oil or grease spills. What can I do in the bathroom?  Use night lights.  Install grab bars by the toilet and in the tub and shower. Do not use towel bars as grab bars.  Use non-skid mats or decals in the tub or shower.  If you need to sit down in the shower, use a plastic, non-slip stool.  Keep the floor dry. Clean up any water that spills on the floor as soon as  it happens.  Remove soap buildup in the tub or shower regularly.  Attach bath mats securely with double-sided non-slip rug tape.  Do not have throw rugs and other things on the floor that can make you trip. What can I do in the bedroom?  Use night lights.  Make sure that you have a light by your bed that is easy to reach.  Do not use any sheets or blankets that are too big for your bed. They should not hang down onto the floor.  Have a firm chair that has side arms. You can use this for support while you get dressed.  Do not have throw rugs and other things on the floor that can make you trip. What can I do in the kitchen?  Clean up any spills right away.  Avoid walking on wet floors.  Keep items that you use a lot in easy-to-reach places.  If you need to reach something above you, use a strong step stool that has a grab bar.  Keep electrical cords out of the way.  Do not use floor polish or wax that makes floors slippery. If you must use wax,  use non-skid floor wax.  Do not have throw rugs and other things on the floor that can make you trip. What can I do with my stairs?  Do not leave any items on the stairs.  Make sure that there are handrails on both sides of the stairs and use them. Fix handrails that are broken or loose. Make sure that handrails are as long as the stairways.  Check any carpeting to make sure that it is firmly attached to the stairs. Fix any carpet that is loose or worn.  Avoid having throw rugs at the top or bottom of the stairs. If you do have throw rugs, attach them to the floor with carpet tape.  Make sure that you have a light switch at the top of the stairs and the bottom of the stairs. If you do not have them, ask someone to add them for you. What else can I do to help prevent falls?  Wear shoes that:  Do not have high heels.  Have rubber bottoms.  Are comfortable and fit you well.  Are closed at the toe. Do not wear sandals.  If  you use a stepladder:  Make sure that it is fully opened. Do not climb a closed stepladder.  Make sure that both sides of the stepladder are locked into place.  Ask someone to hold it for you, if possible.  Clearly mark and make sure that you can see:  Any grab bars or handrails.  First and last steps.  Where the edge of each step is.  Use tools that help you move around (mobility aids) if they are needed. These include:  Canes.  Walkers.  Scooters.  Crutches.  Turn on the lights when you go into a dark area. Replace any light bulbs as soon as they burn out.  Set up your furniture so you have a clear path. Avoid moving your furniture around.  If any of your floors are uneven, fix them.  If there are any pets around you, be aware of where they are.  Review your medicines with your doctor. Some medicines can make you feel dizzy. This can increase your chance of falling. Ask your doctor what other things that you can do to help prevent falls. This information is not intended to replace advice given to you by your health care provider. Make sure you discuss any questions you have with your health care provider. Document Released: 05/23/2009 Document Revised: 01/02/2016 Document Reviewed: 08/31/2014 Elsevier Interactive Patient Education  2017 [employer].

## 2017-12-29 NOTE — Progress Notes (Signed)
Patient: Dawn Burnett Female    DOB: 20-Oct-1928   84 y.o.   MRN: [id number] Visit Date: 12/29/2017  Today's Provider: [name], MD   Chief Complaint  Patient presents with  . Diarrhea    follow up    Subjective:    HPI Patient comes in today for a follow up. She was last seen in the office 1 week ago. She also had her AWV today. She reports that her diarrhea is a little better. She feels that it may be food related. She is finding that mainly fruits and nuts seem to make her symptoms worse.     Allergies  Allergen Reactions  . Iodinated Diagnostic Agents Rash  . Augmentin [Amoxicillin-Pot Clavulanate] Other (See Comments)    Gi distress   . Codeine   . Crestor [Rosuvastatin] Other (See Comments)    Unknown Unknown  . Levaquin [Levofloxacin In D5w]     Stomach pain, body pain/ache all over Stomach pain, body pain/ache all over  . Naproxen Other (See Comments)    unknown unknown  . Prednisone   . Sulfa Antibiotics   . Sulfacetamide Sodium   . Tolectin [Tolmetin]      Current Outpatient Medications:  .  acetaminophen (TYLENOL) 500 MG tablet, Take 500 mg by mouth every 6 (six) hours as needed for mild pain, moderate pain, fever or headache., Disp: , Rfl:  .  atorvastatin (LIPITOR) 20 MG tablet, Take 20 mg by mouth at bedtime. , Disp: , Rfl:  .  Calcium-Vitamin D 600-200 MG-UNIT per tablet, Take 600 tablets by mouth daily with supper. , Disp: , Rfl:  .  celecoxib (CELEBREX) 200 MG capsule, Take 1 capsule (200 mg total) by mouth daily., Disp: 90 capsule, Rfl: 3 .  COUMADIN 1 MG tablet, TAKE 1 (ONE) TABLET TABLET, ORAL, DAILY AS DIRECTED (Patient not taking: Reported on 12/29/2017), Disp: 30 tablet, Rfl: 11 .  COUMADIN 4 MG tablet, TAKE 1 TABLET AS DIRECTED (Patient taking differently: TAKE 1 TABLET AS DIRECTED daily), Disp: 30 tablet, Rfl: 11 .  diltiazem (CARDIZEM CD) 120 MG 24 hr capsule, Take 120 mg by mouth daily., Disp: , Rfl:  .  diphenoxylate-atropine  (LOMOTIL) 2.5-0.025 MG tablet, Take 1 tablet by mouth daily as needed for diarrhea or loose stools. (Patient not taking: Reported on 12/29/2017), Disp: 14 tablet, Rfl: 0 .  ENTRESTO 24-26 MG, Take 1 tablet by mouth 2 (two) times daily. , Disp: , Rfl:  .  furosemide (LASIX) 20 MG tablet, Take 20 mg by mouth daily. , Disp: , Rfl:  .  gabapentin (NEURONTIN) 600 MG tablet, Take 1 tablet (600 mg total) by mouth 3 (three) times daily., Disp: 90 tablet, Rfl: 11 .  loperamide (IMODIUM) 2 MG capsule, Take by mouth., Disp: , Rfl:  .  metoprolol (LOPRESSOR) 100 MG tablet, Take 100 mg by mouth 2 (two) times daily. , Disp: , Rfl:  .  mirabegron ER (MYRBETRIQ) 50 MG TB24 tablet, Take 50 mg by mouth., Disp: , Rfl:  .  Multiple Vitamins-Minerals (CENTRUM SILVER ULTRA WOMENS PO), Take 1 tablet by mouth daily with lunch. , Disp: , Rfl:  .  nystatin cream (MYCOSTATIN), APPLY  TOPICALLY 2 (TWO) TIMES DAILY. APPLY TO AFFECTED AREA (Patient taking differently: as needed. APPLY  TOPICALLY 2 (TWO) TIMES DAILY. APPLY TO AFFECTED AREA), Disp: 30 g, Rfl: 1 .  omeprazole (PRILOSEC) 20 MG capsule, TAKE ONE CAPSULE DAILY, Disp: 90 capsule, Rfl: 1 .  ranitidine (ZANTAC) 150 MG tablet, TAKE 1 TABLET BY MOUTH TWO TIMES DAILY, Disp: 180 tablet, Rfl: 10 .  SYNTHROID 50 MCG tablet, TAKE 1 TABLET (50 MCG TOTAL) BY MOUTH DAILY BEFORE BREAKFAST., Disp: 90 tablet, Rfl: 3 .  warfarin (COUMADIN) 3 MG tablet, Take 1 tablet (3 mg total) by mouth daily. (Patient not taking: Reported on 12/29/2017), Disp: 30 tablet, Rfl: 12  Review of Systems  Constitutional: Positive for activity change and fatigue.  HENT: Negative.   Eyes: Negative.   Gastrointestinal: Positive for abdominal pain and diarrhea. Negative for abdominal distention, anal bleeding, blood in stool, constipation, nausea, rectal pain and vomiting.  Endocrine: Negative.   Skin: Negative.   Allergic/Immunologic: Negative.   Psychiatric/Behavioral: Negative.     Social History    Tobacco Use  . Smoking status: Never Smoker  . Smokeless tobacco: Never Used  Substance Use Topics  . Alcohol use: No   Objective:   BP 146/62 (BP Location: Right Arm)    Pulse 76    Temp 98.6 F (37 C) (Oral)    Ht 5\' 7"  (1.702 m)    Wt 173 lb 3.2 oz (78.6 kg)    BMI 27.13 kg/m    BSA 1.93 m        Physical Exam  Constitutional: She is oriented to person, place, and time. She appears well-developed and well-nourished.  HENT:  Head: Normocephalic and atraumatic.  Eyes: Conjunctivae are normal. No scleral icterus.  Neck: No thyromegaly present.  Cardiovascular: Normal rate, regular rhythm and normal heart sounds.  Pulmonary/Chest: Effort normal and breath sounds normal.  Abdominal: Soft.  Musculoskeletal: She exhibits no edema.  Neurological: She is alert and oriented to person, place, and time.  Skin: Skin is warm and dry.  Psychiatric: She has a normal mood and affect. Her behavior is normal. Judgment and thought content normal.        Assessment & Plan:      Chronic Diarrhea Afib HLD OA CHF      I have done the exam and reviewed the chart and it is accurate to the best of my knowledge. Development worker, community has been used and  any errors in dictation or transcription are unintentional. Miguel Aschoff M.D. [place], MD  [facility]

## 2017-12-29 NOTE — Progress Notes (Signed)
Subjective:   Dawn Burnett is a 84 y.o. female who presents for Medicare Annual (Subsequent) preventive examination.  Review of Systems:  N/A  Cardiac Risk Factors include: advanced age (>31men, >65 women);diabetes mellitus;dyslipidemia;hypertension     Objective:     Vitals: BP (!) 146/62 (BP Location: Right Arm)   Pulse 76   Temp 98.6 F (37 C) (Oral)   Ht 5\' 7"  (1.702 m)   Wt 173 lb 3.2 oz (78.6 kg)   BMI 27.13 kg/m   Body mass index is 27.13 kg/m.  Advanced Directives 12/29/2017 07/29/2017 03/31/2017 12/17/2016 08/03/2016 07/31/2016 03/17/2016  Does Patient Have a Medical Advance Directive? No Yes No No No No No  Would patient like information on creating a medical advance directive? No - Patient declined - No - Patient declined No - Patient declined - - -    Tobacco Social History   Tobacco Use  Smoking Status Never Smoker  Smokeless Tobacco Never Used     Counseling given: Not Answered   Clinical Intake:  Pre-visit preparation completed: Yes  Pain : 0-10 Pain Score: 5  Pain Location: Foot Pain Orientation: Left, Right Pain Descriptors / Indicators: Burning Pain Frequency: Constant(neuropathy)     Nutritional Status: BMI 25 -29 Overweight Nutritional Risks: Nausea/ vomitting/ diarrhea(diarrhea occasionally due to what pt eats) Diabetes: Yes CBG done?: No Did pt. bring in CBG monitor from home?: No  How often do you need to have someone help you when you read instructions, pamphlets, or other written materials from your doctor or pharmacy?: 1 - Never  Interpreter Needed?: No  Information entered by :: [facility], LPN  Past Medical History:  Diagnosis Date  . Arthritis   . Atrial fibrillation ([place])   . CHF (congestive heart failure) ([place])   . GERD (gastroesophageal reflux disease)   . Hiatal hernia   . Hypertension   . Skin cancer, basal cell   . Thyroid disease    Past Surgical History:  Procedure Laterality Date  . ABDOMINAL HYSTERECTOMY     . APPENDECTOMY    . BREAST SURGERY    . cateract extraction    . EYE SURGERY     done by Dr. Vickki Muff  . KNEE SURGERY    . 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 right hip surgery    . right total knee replacement     Family History  Problem Relation Age of Onset  . Heart disease Mother   . Hypertension Mother   . Heart attack Mother   . Stomach cancer Father   . Liver cancer Brother   . Hypertension Son   . Huntington's disease Sister   . Diabetes Sister   . Kidney disease Sister   . Congestive Heart Failure Sister   . Pneumonia Sister   . Stroke Brother   . Parkinson's disease Brother   . Hypertension Brother   . Congestive Heart Failure Brother    Social History   Socioeconomic History  . Marital status: Widowed    Spouse name: widowed  . Number of children: 2  . Years of education: 36  . Highest education level: 12th grade  Occupational History  . Occupation: retired  Scientific laboratory technician  . Financial resource strain: Not hard at all  . Food insecurity:    Worry: Never true    Inability: Never true  . Transportation needs:    Medical: No    Non-medical: No  Tobacco Use  .  Smoking status: Never Smoker  . Smokeless tobacco: Never Used  Substance and Sexual Activity  . Alcohol use: No  . Drug use: No  . Sexual activity: Never  Lifestyle  . Physical activity:    Days per week: Not on file    Minutes per session: Not on file  . Stress: Not at all  Relationships  . Social connections:    Talks on phone: Not on file    Gets together: Not on file    Attends religious service: Not on file    Active member of club or organization: Not on file    Attends meetings of clubs or organizations: Not on file    Relationship status: Not on file  Other Topics Concern  . Not on file  Social History Narrative  . Not on file    Outpatient Encounter Medications as of 12/29/2017  Medication Sig  . acetaminophen (TYLENOL) 500 MG tablet Take  500 mg by mouth every 6 (six) hours as needed for mild pain, moderate pain, fever or headache.  Marland Kitchen atorvastatin (LIPITOR) 20 MG tablet Take 20 mg by mouth at bedtime.   . Calcium-Vitamin D 600-200 MG-UNIT per tablet Take 600 tablets by mouth daily with supper.   . celecoxib (CELEBREX) 200 MG capsule Take 1 capsule (200 mg total) by mouth daily.  Marland Kitchen COUMADIN 4 MG tablet TAKE 1 TABLET AS DIRECTED (Patient taking differently: TAKE 1 TABLET AS DIRECTED daily)  . diltiazem (CARDIZEM CD) 120 MG 24 hr capsule Take 120 mg by mouth daily.  Marland Kitchen ENTRESTO 24-26 MG Take 1 tablet by mouth 2 (two) times daily.   . furosemide (LASIX) 20 MG tablet Take 20 mg by mouth daily.   Marland Kitchen gabapentin (NEURONTIN) 600 MG tablet Take 1 tablet (600 mg total) by mouth 3 (three) times daily.  Marland Kitchen loperamide (IMODIUM) 2 MG capsule Take by mouth.  . metoprolol (LOPRESSOR) 100 MG tablet Take 100 mg by mouth 2 (two) times daily.   . mirabegron ER (MYRBETRIQ) 50 MG TB24 tablet Take 50 mg by mouth.  . Multiple Vitamins-Minerals (CENTRUM SILVER ULTRA WOMENS PO) Take 1 tablet by mouth daily with lunch.   . nystatin cream (MYCOSTATIN) APPLY  TOPICALLY 2 (TWO) TIMES DAILY. APPLY TO AFFECTED AREA (Patient taking differently: as needed. APPLY  TOPICALLY 2 (TWO) TIMES DAILY. APPLY TO AFFECTED AREA)  . omeprazole (PRILOSEC) 20 MG capsule TAKE ONE CAPSULE DAILY  . ranitidine (ZANTAC) 150 MG tablet TAKE 1 TABLET BY MOUTH TWO TIMES DAILY  . SYNTHROID 50 MCG tablet TAKE 1 TABLET (50 MCG TOTAL) BY MOUTH DAILY BEFORE BREAKFAST.  Marland Kitchen COUMADIN 1 MG tablet TAKE 1 (ONE) TABLET TABLET, ORAL, DAILY AS DIRECTED (Patient not taking: Reported on 12/29/2017)  . diphenoxylate-atropine (LOMOTIL) 2.5-0.025 MG tablet Take 1 tablet by mouth daily as needed for diarrhea or loose stools. (Patient not taking: Reported on 12/29/2017)  . warfarin (COUMADIN) 3 MG tablet Take 1 tablet (3 mg total) by mouth daily. (Patient not taking: Reported on 12/29/2017)   No  facility-administered encounter medications on file as of 12/29/2017.     Activities of Daily Living In your present state of health, do you have any difficulty performing the following activities: 12/29/2017  Hearing? Y  Comment Pt declines referral to see an audiologist or getting hearing aids at this time..  Vision? N  Difficulty concentrating or making decisions? N  Walking or climbing stairs? Y  Comment Due to stiff knees.   Dressing or bathing? N  Doing errands, shopping? Y  Comment Does not drive.  Preparing Food and eating ? N  Using the Toilet? N  In the past six months, have you accidently leaked urine? Y  Comment Wears protection daily.   Do you have problems with loss of bowel control? Y  Comment Yes due to intermitten diarrhea, wears protection all the time.   Managing your Medications? Y  Comment Son fixes medication box for the week.  Managing your Finances? N  Comment Daughter helps balance checkbook.   Housekeeping or managing your Housekeeping? Y  Comment Has assistance with cleaning.   Some recent data might be hidden    Patient Care Team: Jerrol Banana., MD as PCP - General (Family Medicine) Oneta Rack, MD as Consulting Physician (Dermatology) Gardiner Barefoot, DPM as Consulting Physician (Podiatry) Margaretha Sheffield, MD as Consulting Physician (Otolaryngology) Dionisio David, MD as Consulting Physician (Cardiology)    Assessment:   This is a routine wellness examination for Dawn Burnett.  Exercise Activities and Dietary recommendations Current Exercise Habits: Home exercise routine, Type of exercise: walking(inside of home), Frequency (Times/Week): 7, Intensity: Mild, Exercise limited by: orthopedic condition(s)  Goals    . DIET - INCREASE WATER INTAKE     Recommend increasing water intake to 4-6 glasses a day.        Fall Risk Fall Risk  12/29/2017 12/17/2016 12/24/2015 11/21/2015 09/30/2015  Falls in the past year? No Yes Yes Yes Yes  Number falls  in past yr: - 2 or more 1 1 1   Injury with Fall? - No Yes No No  Risk Factor Category  - High Fall Risk - - -  Risk for fall due to : - - - - Impaired balance/gait  Follow up - Falls prevention discussed Falls evaluation completed - Education provided   Is the patient's home free of loose throw rugs in walkways, pet beds, electrical cords, etc?   yes      Grab bars in the bathroom? yes      Handrails on the stairs?   yes      Adequate lighting?   yes  Timed Get Up and Go performed: N/A  Depression Screen PHQ 2/9 Scores 12/29/2017 12/29/2017 12/17/2016 12/17/2016  PHQ - 2 Score 0 0 0 0  PHQ- 9 Score 2 - 2 -     Cognitive Function      6CIT Screen 12/29/2017 12/17/2016  What Year? 0 points 0 points  What month? 0 points 0 points  What time? 0 points 0 points  Count back from 20 0 points 0 points  Months in reverse 0 points 0 points  Repeat phrase 2 points 2 points  Total Score 2 2    Immunization History  Administered Date(s) Administered  . Influenza, High Dose Seasonal PF 04/28/2016, 04/30/2017  . Influenza,inj,Quad PF,6+ Mos 05/22/2015  . Pneumococcal Conjugate-13 07/10/2015  . Pneumococcal Polysaccharide-23 12/17/2016  . Td 06/20/2004    Qualifies for Shingles Vaccine? Due for Shingles vaccine. Declined my offer to administer today. Education has been provided regarding the importance of this vaccine. Pt has been advised to call her insurance company to determine her out of pocket expense. Advised she may also receive this vaccine at her local pharmacy or Health Dept. Verbalized acceptance and understanding.  Screening Tests Health Maintenance  Topic Date Due  . TETANUS/TDAP  06/20/2014  . FOOT EXAM  04/19/2015  . URINE MICROALBUMIN  01/21/2016  . INFLUENZA VACCINE  03/10/2018  . OPHTHALMOLOGY  EXAM  06/10/2018  . HEMOGLOBIN A1C  06/11/2018  . DEXA SCAN  Completed  . PNA vac Low Risk Adult  Completed    Cancer Screenings: Lung: Low Dose CT Chest recommended if Age  70-80 years, 30 pack-year currently smoking OR have quit w/in 15years. Patient does not qualify. Breast:  Up to date on Mammogram? Yes   Up to date of Bone Density/Dexa? Yes Colorectal: N/A  Additional Screenings:  Hepatitis C Screening: N/A     Plan:  I have personally reviewed and addressed the Medicare Annual Wellness questionnaire and have noted the following in the patient's chart:  A. Medical and social history B. Use of alcohol, tobacco or illicit drugs  C. Current medications and supplements D. Functional ability and status E.  Nutritional status F.  Physical activity G. Advance directives H. List of other physicians I.  Hospitalizations, surgeries, and ER visits in previous 12 months J.  [place] such as hearing and vision if needed, cognitive and depression L. Referrals and appointments - none  In addition, I have reviewed and discussed with patient certain preventive protocols, quality metrics, and best practice recommendations. A written personalized care plan for preventive services as well as general preventive health recommendations were provided to patient.  See attached scanned questionnaire for additional information.   Signed,  Fabio Neighbors, LPN Nurse Health Advisor   Nurse Recommendations: Pt needs a diabetic foot exam and urine microalbumin checked today. Pt declined the tetanus vaccine today. Will request last diabetic eye exam from [facility].

## 2017-12-29 NOTE — Patient Instructions (Signed)
Description   4 MG DAILY     

## 2017-12-30 ENCOUNTER — Other Ambulatory Visit: Payer: Self-pay | Admitting: Family Medicine

## 2018-01-05 ENCOUNTER — Ambulatory Visit: Payer: Self-pay | Admitting: Family Medicine

## 2018-01-26 ENCOUNTER — Ambulatory Visit: Payer: Self-pay

## 2018-01-28 ENCOUNTER — Ambulatory Visit: Payer: Medicare Other

## 2018-01-28 DIAGNOSIS — I4891 Unspecified atrial fibrillation: Secondary | ICD-10-CM | POA: Diagnosis not present

## 2018-01-28 LAB — POCT INR
INR: 2.4 (ref 2.0–3.0)
PT: 28.8

## 2018-01-28 NOTE — Patient Instructions (Signed)
Description   4 MG DAILY

## 2018-02-08 ENCOUNTER — Ambulatory Visit: Payer: Self-pay | Admitting: Family Medicine

## 2018-02-10 ENCOUNTER — Other Ambulatory Visit: Payer: Self-pay | Admitting: Family Medicine

## 2018-02-10 DIAGNOSIS — E039 Hypothyroidism, unspecified: Secondary | ICD-10-CM

## 2018-02-23 ENCOUNTER — Ambulatory Visit: Payer: Self-pay

## 2018-02-23 ENCOUNTER — Ambulatory Visit: Payer: Medicare Other | Admitting: Family Medicine

## 2018-02-23 DIAGNOSIS — I4891 Unspecified atrial fibrillation: Secondary | ICD-10-CM | POA: Diagnosis not present

## 2018-02-23 LAB — POCT INR
INR: 2.1 (ref 2.0–3.0)
PT: 28

## 2018-02-23 NOTE — Patient Instructions (Signed)
Description   4 MG DAILY

## 2018-03-10 ENCOUNTER — Encounter: Payer: Self-pay | Admitting: Podiatry

## 2018-03-10 ENCOUNTER — Ambulatory Visit: Payer: Medicare Other | Admitting: Podiatry

## 2018-03-10 DIAGNOSIS — M79676 Pain in unspecified toe(s): Secondary | ICD-10-CM

## 2018-03-10 DIAGNOSIS — D689 Coagulation defect, unspecified: Secondary | ICD-10-CM

## 2018-03-10 DIAGNOSIS — B351 Tinea unguium: Secondary | ICD-10-CM | POA: Diagnosis not present

## 2018-03-10 DIAGNOSIS — E1142 Type 2 diabetes mellitus with diabetic polyneuropathy: Secondary | ICD-10-CM

## 2018-03-10 NOTE — Progress Notes (Signed)
Complaint:  Visit Type: Patient returns to my office for continued preventative foot care services. Complaint: Patient states" my nails have grown long and thick and become painful to walk and wear shoes" Patient has been diagnosed with hyperglycemia with neuropathy.. The patient presents for preventative foot care services. No changes to ROS  Podiatric Exam: Vascular: dorsalis pedis and posterior tibial pulses are palpable bilateral. Capillary return is immediate. Temperature gradient is WNL. Skin turgor WNL  Dorsal skin lesions due to vascular. Sensorium: Normal Semmes Weinstein monofilament test. Normal tactile sensation bilaterally. Nail Exam: Pt has thick disfigured discolored nails with subungual debris noted bilateral entire nail hallux through fifth toenails Ulcer Exam: There is no evidence of ulcer or pre-ulcerative changes or infection. Orthopedic Exam: Muscle tone and strength are WNL. No limitations in general ROM. No crepitus or effusions noted. Foot type and digits show no abnormalities.  HAV  B/L Skin: No Porokeratosis. No infection or ulcers  Diagnosis:  Onychomycosis, , Pain in right toe, pain in left toes  Treatment & Plan Procedures and Treatment: Consent by patient was obtained for treatment procedures. The patient understood the discussion of treatment and procedures well. All questions were answered thoroughly reviewed. Debridement of mycotic and hypertrophic toenails, 1 through 5 bilateral and clearing of subungual debris. No ulceration, no infection noted.  Return Visit-Office Procedure: Patient instructed to return to the office for a follow up visit 3 months for continued evaluation and treatment.    Gardiner Barefoot DPM

## 2018-03-23 ENCOUNTER — Ambulatory Visit: Payer: Medicare Other | Admitting: Family Medicine

## 2018-03-23 ENCOUNTER — Ambulatory Visit: Payer: Medicare Other

## 2018-03-23 DIAGNOSIS — I4891 Unspecified atrial fibrillation: Secondary | ICD-10-CM | POA: Diagnosis not present

## 2018-03-23 LAB — POCT INR
INR: 2.2 (ref 2.0–3.0)
PT: 26

## 2018-03-23 NOTE — Patient Instructions (Signed)
Continue current dose of 4mg  daily.  Recheck in four weeks.

## 2018-03-28 ENCOUNTER — Encounter: Payer: Self-pay | Admitting: Family Medicine

## 2018-03-28 ENCOUNTER — Ambulatory Visit: Payer: Medicare Other | Admitting: Family Medicine

## 2018-03-28 VITALS — BP 134/66 | HR 74 | Temp 98.2°F | Wt 169.0 lb

## 2018-03-28 DIAGNOSIS — E1121 Type 2 diabetes mellitus with diabetic nephropathy: Secondary | ICD-10-CM | POA: Diagnosis not present

## 2018-03-28 DIAGNOSIS — R350 Frequency of micturition: Secondary | ICD-10-CM

## 2018-03-28 DIAGNOSIS — G629 Polyneuropathy, unspecified: Secondary | ICD-10-CM | POA: Diagnosis not present

## 2018-03-28 MED ORDER — IMIPRAMINE HCL 25 MG PO TABS: 25.0000 mg | ORAL_TABLET | Freq: Every day | ORAL | 5 refills | Status: DC

## 2018-03-28 NOTE — Progress Notes (Signed)
Patient: Dawn Burnett Female    DOB: 09/04/1928   83 y.o.   MRN: [id number] Visit Date: 03/28/2018  Today's Provider: [name], MD   Chief Complaint  Patient presents with  . Peripheral Neuropathy   Subjective:    HPI Pt coming in today to discuss medications.  She would like to try and get a prescription for "Diabetic Shoes" in hopes to help with her neuropathy.  She states there are some days where she can't walk.  She would also like to try an additional medication, she is taking Gabapentin and Tylenol regularly.  She reports the medicines are not working as well.   She is absolutely miserable due to the pain.  She is not suicidal but wishes she could just die due to the discomfort she has in her feet and now some in her fingers.  She has some arthritic changes but mainly this is neuropathic pain that she is describing. Pt says her Urologist has moved out of town and needs for Dr. Rosanna Randy to take over her medications.      Allergies  Allergen Reactions  . Iodinated Diagnostic Agents Rash  . Augmentin [Amoxicillin-Pot Clavulanate] Other (See Comments)    Gi distress   . Codeine   . Crestor [Rosuvastatin] Other (See Comments)    Unknown Unknown  . Levaquin [Levofloxacin In D5w]     Stomach pain, body pain/ache all over Stomach pain, body pain/ache all over  . Naproxen Other (See Comments)    unknown unknown  . Prednisone   . Sulfa Antibiotics   . Sulfacetamide Sodium   . Tolectin [Tolmetin]      Current Outpatient Medications:  .  acetaminophen (TYLENOL) 500 MG tablet, Take 500 mg by mouth every 6 (six) hours as needed for mild pain, moderate pain, fever or headache., Disp: , Rfl:  .  atorvastatin (LIPITOR) 20 MG tablet, Take 20 mg by mouth at bedtime. , Disp: , Rfl:  .  Calcium-Vitamin D 600-200 MG-UNIT per tablet, Take 600 tablets by mouth daily with supper. , Disp: , Rfl:  .  celecoxib (CELEBREX) 200 MG capsule, Take 1 capsule (200 mg total) by  mouth daily., Disp: 90 capsule, Rfl: 3 .  COUMADIN 1 MG tablet, TAKE 1 (ONE) TABLET TABLET, ORAL, DAILY AS DIRECTED, Disp: 30 tablet, Rfl: 11 .  COUMADIN 4 MG tablet, TAKE 1 TABLET AS DIRECTED, Disp: 30 tablet, Rfl: 10 .  diltiazem (CARDIZEM CD) 120 MG 24 hr capsule, Take 120 mg by mouth daily., Disp: , Rfl:  .  diphenoxylate-atropine (LOMOTIL) 2.5-0.025 MG tablet, Take 1 tablet by mouth daily as needed for diarrhea or loose stools., Disp: 14 tablet, Rfl: 0 .  ENTRESTO 24-26 MG, Take 1 tablet by mouth 2 (two) times daily. , Disp: , Rfl:  .  furosemide (LASIX) 20 MG tablet, Take 20 mg by mouth daily. , Disp: , Rfl:  .  gabapentin (NEURONTIN) 600 MG tablet, Take 1 tablet (600 mg total) by mouth 3 (three) times daily., Disp: 90 tablet, Rfl: 11 .  loperamide (IMODIUM) 2 MG capsule, Take by mouth., Disp: , Rfl:  .  metoprolol (LOPRESSOR) 100 MG tablet, Take 100 mg by mouth 2 (two) times daily. , Disp: , Rfl:  .  mirabegron ER (MYRBETRIQ) 50 MG TB24 tablet, Take 50 mg by mouth., Disp: , Rfl:  .  Multiple Vitamins-Minerals (CENTRUM SILVER ULTRA WOMENS PO), Take 1 tablet by mouth daily with lunch. , Disp: ,  Rfl:  .  nystatin cream (MYCOSTATIN), APPLY  TOPICALLY 2 (TWO) TIMES DAILY. APPLY TO AFFECTED AREA (Patient taking differently: as needed. APPLY  TOPICALLY 2 (TWO) TIMES DAILY. APPLY TO AFFECTED AREA), Disp: 30 g, Rfl: 1 .  omeprazole (PRILOSEC) 20 MG capsule, TAKE ONE CAPSULE DAILY, Disp: 90 capsule, Rfl: 1 .  ranitidine (ZANTAC) 150 MG tablet, TAKE 1 TABLET BY MOUTH TWO TIMES DAILY, Disp: 180 tablet, Rfl: 10 .  SYNTHROID 50 MCG tablet, TAKE 1 TABLET (50 MCG TOTAL) BY MOUTH DAILY BEFORE BREAKFAST., Disp: 90 tablet, Rfl: 2 .  warfarin (COUMADIN) 3 MG tablet, Take 1 tablet (3 mg total) by mouth daily., Disp: 30 tablet, Rfl: 12  Review of Systems  Constitutional: Negative for activity change, appetite change, chills, diaphoresis, fatigue, fever and unexpected weight change.  HENT: Negative.   Eyes:  Negative.   Respiratory: Negative.   Cardiovascular: Negative.   Gastrointestinal: Positive for diarrhea (Only on Saturday). Negative for abdominal distention, abdominal pain, anal bleeding, blood in stool, constipation, nausea, rectal pain and vomiting.  Endocrine: Negative.   Genitourinary: Positive for frequency.  Musculoskeletal: Positive for arthralgias, gait problem and myalgias. Negative for back pain, joint swelling, neck pain and neck stiffness.  Allergic/Immunologic: Negative.   Neurological: Positive for numbness. Negative for dizziness, light-headedness and headaches.       She says her feet and fingers hurt.  Psychiatric/Behavioral: Negative.     Social History   Tobacco Use  . Smoking status: Never Smoker  . Smokeless tobacco: Never Used  Substance Use Topics  . Alcohol use: No   Objective:   BP 134/66 (BP Location: Right Arm, Patient Position: Sitting, Cuff Size: Normal)   Pulse 74   Temp 98.2 F (36.8 C) (Oral)   Wt 169 lb (76.7 kg)   SpO2 96%   BMI 26.47 kg/m  Vitals:   03/28/18 1045  BP: 134/66  Pulse: 74  Temp: 98.2 F (36.8 C)  TempSrc: Oral  SpO2: 96%  Weight: 169 lb (76.7 kg)     Physical Exam  Constitutional: She is oriented to person, place, and time. She appears well-developed and well-nourished.  HENT:  Head: Normocephalic and atraumatic.  Right Ear: External ear normal.  Left Ear: External ear normal.  Nose: Nose normal.  Eyes: Conjunctivae are normal. No scleral icterus.  Neck: No thyromegaly present.  Cardiovascular: Normal rate, regular rhythm and normal heart sounds.  Pulmonary/Chest: Effort normal and breath sounds normal.  Abdominal: Soft.  Musculoskeletal: She exhibits no edema.  Neurological: She is alert and oriented to person, place, and time.  Skin: Skin is warm and dry.  Psychiatric: She has a normal mood and affect. Her behavior is normal. Judgment and thought content normal.        Assessment & Plan:     1.  Neuropathy B12 normal--refer to neurology,try Tramadol TID prn pain.On good dose Neurontin. I am worried about fall risk with anything stronger and would avoid.  2. Type 2 diabetes mellitus with diabetic nephropathy, without long-term current use of insulin ([place]) Wrote RX for diabetic shoes. 3.Urinary frequency       [name], MD  [facility]

## 2018-03-28 NOTE — Patient Instructions (Signed)
Start over the counter B12 1,045mcg a day.

## 2018-04-20 ENCOUNTER — Ambulatory Visit: Payer: Medicare Other | Admitting: Family Medicine

## 2018-04-20 ENCOUNTER — Encounter: Payer: Self-pay | Admitting: Family Medicine

## 2018-04-20 ENCOUNTER — Ambulatory Visit: Payer: Medicare Other

## 2018-04-20 VITALS — BP 142/62 | HR 84 | Temp 99.1°F | Resp 16 | Ht 67.0 in | Wt 169.0 lb

## 2018-04-20 DIAGNOSIS — E1121 Type 2 diabetes mellitus with diabetic nephropathy: Secondary | ICD-10-CM | POA: Diagnosis not present

## 2018-04-20 DIAGNOSIS — R404 Transient alteration of awareness: Secondary | ICD-10-CM

## 2018-04-20 DIAGNOSIS — I4891 Unspecified atrial fibrillation: Secondary | ICD-10-CM | POA: Diagnosis not present

## 2018-04-20 DIAGNOSIS — M1990 Unspecified osteoarthritis, unspecified site: Secondary | ICD-10-CM | POA: Diagnosis not present

## 2018-04-20 DIAGNOSIS — I10 Essential (primary) hypertension: Secondary | ICD-10-CM

## 2018-04-20 DIAGNOSIS — N390 Urinary tract infection, site not specified: Secondary | ICD-10-CM

## 2018-04-20 DIAGNOSIS — G629 Polyneuropathy, unspecified: Secondary | ICD-10-CM | POA: Diagnosis not present

## 2018-04-20 LAB — POCT INR
INR: 2.1 (ref 2.0–3.0)
PT: 25.1

## 2018-04-20 LAB — POCT GLYCOSYLATED HEMOGLOBIN (HGB A1C)
Est. average glucose Bld gHb Est-mCnc: 154
Hemoglobin A1C: 7 % — AB (ref 4.0–5.6)

## 2018-04-20 NOTE — Progress Notes (Signed)
Patient: Dawn Burnett Female    DOB: [birth year]   84 y.o.   MRN: [id number] Visit Date: 04/20/2018  Today's Provider: [name], MD   Chief Complaint  Patient presents with  . Peripheral Neuropathy  . Diabetes  . Hypertension  . Atrial Fibrillation   Subjective:    HPI Patient comes in today for a follow up. She was last seen in the office 1 month ago.  BP Readings from Last 3 Encounters:  04/20/18 (!) 142/62  03/28/18 134/66  12/29/17 (!) 146/62   On last visit, patient was advised to start Tramadol TID as needed to help with neuropathy pain. Patient reports that she could not get this prescription due to insurance and it was changed to imipramine 25mg . She reports that this has helped with her sleep and she does not wake up in the middle of the night with pain as much as she used to.   She was also written for a prescription for diabetic shoes on last visit and she reports that she needs a diabetic foot exam before she receives her shoes.   She is also due for her PT/INR recheck. She is currently taking Coumadin 4mg  daily. She denies any abnormal bleeding, but she does have bruising which is normal for her.  Pt had a "weak spell" 2 days ago. No other symptoms,no palpitation. Lab Results  Component Value Date   INR 2.1 04/20/2018   INR 2.2 03/23/2018   INR 2.1 02/23/2018   PROTIME 28.3 03/20/2015   PROTIME 2.7 (A) 11/27/2014   Her blood sugars at home has been "steady" per patient. She reports that she does have moments of weakness and feeling like her legs are giving out on her. She denies dizziness, chest pain, or headaches.  Lab Results  Component Value Date   HGBA1C 7.0 (A) 04/20/2018    Allergies  Allergen Reactions  . Iodinated Diagnostic Agents Rash  . Augmentin [Amoxicillin-Pot Clavulanate] Other (See Comments)    Gi distress   . Codeine   . Crestor [Rosuvastatin] Other (See Comments)    Unknown Unknown  . Levaquin [Levofloxacin In D5w]       Stomach pain, body pain/ache all over Stomach pain, body pain/ache all over  . Naproxen Other (See Comments)    unknown unknown  . Prednisone   . Sulfa Antibiotics   . Sulfacetamide Sodium   . Tolectin [Tolmetin]      Current Outpatient Medications:  .  acetaminophen (TYLENOL) 500 MG tablet, Take 500 mg by mouth every 6 (six) hours as needed for mild pain, moderate pain, fever or headache., Disp: , Rfl:  .  atorvastatin (LIPITOR) 20 MG tablet, Take 20 mg by mouth at bedtime. , Disp: , Rfl:  .  Calcium-Vitamin D 600-200 MG-UNIT per tablet, Take 600 tablets by mouth daily with supper. , Disp: , Rfl:  .  celecoxib (CELEBREX) 200 MG capsule, Take 1 capsule (200 mg total) by mouth daily., Disp: 90 capsule, Rfl: 3 .  COUMADIN 1 MG tablet, TAKE 1 (ONE) TABLET TABLET, ORAL, DAILY AS DIRECTED, Disp: 30 tablet, Rfl: 11 .  COUMADIN 4 MG tablet, TAKE 1 TABLET AS DIRECTED, Disp: 30 tablet, Rfl: 10 .  diltiazem (CARDIZEM CD) 120 MG 24 hr capsule, Take 120 mg by mouth daily., Disp: , Rfl:  .  diphenoxylate-atropine (LOMOTIL) 2.5-0.025 MG tablet, Take 1 tablet by mouth daily as needed for diarrhea or loose stools., Disp: 14 tablet, Rfl: 0 .  ENTRESTO 24-26 MG, Take 1 tablet by mouth 2 (two) times daily. , Disp: , Rfl:  .  furosemide (LASIX) 20 MG tablet, Take 20 mg by mouth daily. , Disp: , Rfl:  .  gabapentin (NEURONTIN) 600 MG tablet, Take 1 tablet (600 mg total) by mouth 3 (three) times daily., Disp: 90 tablet, Rfl: 11 .  imipramine (TOFRANIL) 25 MG tablet, Take 1 tablet (25 mg total) by mouth at bedtime., Disp: 30 tablet, Rfl: 5 .  loperamide (IMODIUM) 2 MG capsule, Take by mouth., Disp: , Rfl:  .  metoprolol (LOPRESSOR) 100 MG tablet, Take 100 mg by mouth 2 (two) times daily. , Disp: , Rfl:  .  mirabegron ER (MYRBETRIQ) 50 MG TB24 tablet, Take 50 mg by mouth., Disp: , Rfl:  .  Multiple Vitamins-Minerals (CENTRUM SILVER ULTRA WOMENS PO), Take 1 tablet by mouth daily with lunch. , Disp: , Rfl:  .   nystatin cream (MYCOSTATIN), APPLY  TOPICALLY 2 (TWO) TIMES DAILY. APPLY TO AFFECTED AREA (Patient taking differently: as needed. APPLY  TOPICALLY 2 (TWO) TIMES DAILY. APPLY TO AFFECTED AREA), Disp: 30 g, Rfl: 1 .  omeprazole (PRILOSEC) 20 MG capsule, TAKE ONE CAPSULE DAILY, Disp: 90 capsule, Rfl: 1 .  ranitidine (ZANTAC) 150 MG tablet, TAKE 1 TABLET BY MOUTH TWO TIMES DAILY, Disp: 180 tablet, Rfl: 10 .  SYNTHROID 50 MCG tablet, TAKE 1 TABLET (50 MCG TOTAL) BY MOUTH DAILY BEFORE BREAKFAST., Disp: 90 tablet, Rfl: 2 .  warfarin (COUMADIN) 3 MG tablet, Take 1 tablet (3 mg total) by mouth daily., Disp: 30 tablet, Rfl: 12  Review of Systems  Constitutional: Positive for activity change and fatigue.  HENT: Positive for congestion, hearing loss and rhinorrhea.   Respiratory: Negative for cough and shortness of breath.   Cardiovascular: Positive for leg swelling. Negative for chest pain and palpitations.  Endocrine: Negative for cold intolerance, heat intolerance, polydipsia, polyphagia and polyuria.  Musculoskeletal: Positive for arthralgias, back pain and myalgias.  Skin: Negative.   Neurological: Positive for weakness. Negative for dizziness, syncope, light-headedness and headaches.  Psychiatric/Behavioral: Negative for agitation, confusion, decreased concentration, dysphoric mood, hallucinations, self-injury, sleep disturbance and suicidal ideas. The patient is nervous/anxious. The patient is not hyperactive.     Social History   Tobacco Use  . Smoking status: Never Smoker  . Smokeless tobacco: Never Used  Substance Use Topics  . Alcohol use: No   Objective:   BP (!) 142/62 (BP Location: Left Arm, Patient Position: Sitting, Cuff Size: Normal)   Pulse 84   Temp 99.1 F (37.3 C)   Resp 16   Ht 5\' 7"  (1.702 m)   Wt 169 lb (76.7 kg)   SpO2 96%   BMI 26.47 kg/m  Vitals:   04/20/18 0957  BP: (!) 142/62  Pulse: 84  Resp: 16  Temp: 99.1 F (37.3 C)  SpO2: 96%  Weight: 169 lb (76.7  kg)  Height: 5\' 7"  (1.702 m)     Physical Exam  Constitutional: She is oriented to person, place, and time. She appears well-developed and well-nourished.  HENT:  Head: Normocephalic and atraumatic.  Right Ear: External ear normal.  Left Ear: External ear normal.  Nose: Nose normal.  Eyes: Conjunctivae are normal. No scleral icterus.  Neck: No thyromegaly present.  Cardiovascular: Normal rate, regular rhythm and normal heart sounds.  Pulmonary/Chest: Effort normal and breath sounds normal.  Abdominal: Soft.  Musculoskeletal: She exhibits no edema.  Lymphadenopathy:    She has no cervical adenopathy.  Neurological: She is alert and oriented to person, place, and time.  Foot exam--pt cannot feel thick monofilament.  Skin: Skin is warm and dry.  Psychiatric: She has a normal mood and affect. Her behavior is normal. Judgment and thought content normal.        Assessment & Plan:     1. Type 2 diabetes mellitus with diabetic nephropathy, without long-term current use of insulin (HCC)  - POCT glycosylated hemoglobin (Hb A1C)  2. Neuropathy Worsening/significant--pt does routine foot exams.  3. Atrial fibrillation, controlled ([place])  - POCT INR  4. Arthritis   5. Essential (primary) hypertension   6. Urinary tract infection without hematuria, site unspecified  - Urine Culture 7.Spell Does not sound worriesome--if recurs may need to see cardiology--Holter vs Loop monitor.      I have done the exam and reviewed the above chart and it is accurate to the best of my knowledge. Development worker, community has been used in this note in any air is in the dictation or transcription are unintentional.  [name], MD  [place]

## 2018-04-22 ENCOUNTER — Telehealth: Payer: Self-pay | Admitting: Family Medicine

## 2018-04-22 LAB — URINE CULTURE

## 2018-04-22 NOTE — Telephone Encounter (Signed)
Pt called wanting to know if we have received the results back from her UC.   She was in Wednesday.  She ask for [facility]  CB# [phone]  Thanks  Con Memos

## 2018-04-25 NOTE — Telephone Encounter (Signed)
Dr. Rosanna Randy can you please review urine culture? Thanks!

## 2018-04-25 NOTE — Progress Notes (Signed)
Advised  ED 

## 2018-05-18 ENCOUNTER — Ambulatory Visit: Payer: Medicare Other

## 2018-05-18 DIAGNOSIS — I4891 Unspecified atrial fibrillation: Secondary | ICD-10-CM | POA: Diagnosis not present

## 2018-05-18 DIAGNOSIS — Z23 Encounter for immunization: Secondary | ICD-10-CM

## 2018-05-18 LAB — POCT INR
INR: 2 (ref 2.0–3.0)
PT: 23.6

## 2018-05-23 ENCOUNTER — Other Ambulatory Visit: Payer: Self-pay | Admitting: Family Medicine

## 2018-05-29 ENCOUNTER — Other Ambulatory Visit: Payer: Self-pay | Admitting: Family Medicine

## 2018-06-01 ENCOUNTER — Telehealth: Payer: Self-pay | Admitting: Family Medicine

## 2018-06-01 NOTE — Telephone Encounter (Signed)
[facility] faxed refill request for the following medications:  celecoxib (CELEBREX) 200 MG capsule    Please advise.  

## 2018-06-01 NOTE — Telephone Encounter (Signed)
Written Rx

## 2018-06-01 NOTE — Telephone Encounter (Signed)
Pt called saying she has a pair of shoes at [facility] waiting for her but they need an order or prescription for her to pick them up.  She said medicare will pay for them as long is there is medical necessity.  She states she came in a couple of months ago and seen Dr. Rosanna Randy about her foot neuropathy  Pt;s CB # [phone]  Thanks  Con Memos

## 2018-06-01 NOTE — Telephone Encounter (Signed)
3 pages were faxed to [place] for pt's diabetic shoes.  The papers were not signed by Dr. Rosanna Randy. Thurmond Butts is needing them refaxed asap w/ Dr. Alben Spittle signature.   Thanks, [employer]

## 2018-06-01 NOTE — Telephone Encounter (Signed)
Pt called to see if the rx for Celebrex 200 mg  Has been sent to [facility].  She said Kristopher Oppenheim told her they requested it on 05/29/18   Pt's CB # [phone]  Thanks Con Memos

## 2018-06-01 NOTE — Telephone Encounter (Signed)
Please review., KW 

## 2018-06-02 ENCOUNTER — Other Ambulatory Visit: Payer: Self-pay | Admitting: Family Medicine

## 2018-06-02 NOTE — Telephone Encounter (Signed)
sisgned and sent to be faxed

## 2018-06-22 ENCOUNTER — Other Ambulatory Visit: Payer: Self-pay | Admitting: Family Medicine

## 2018-06-22 ENCOUNTER — Ambulatory Visit: Payer: Self-pay

## 2018-06-22 ENCOUNTER — Ambulatory Visit: Payer: Medicare Other

## 2018-06-22 DIAGNOSIS — I4891 Unspecified atrial fibrillation: Secondary | ICD-10-CM | POA: Diagnosis not present

## 2018-06-22 LAB — POCT INR
INR: 1.7 — AB (ref 2.0–3.0)
Prothrombin Time: 19.9

## 2018-06-22 NOTE — Telephone Encounter (Signed)
Pt contacted office for refill request on the following medications:  warfarin 1 mg  Dawn Burnett  Please advise. Thanks TNP

## 2018-06-22 NOTE — Patient Instructions (Signed)
Description   Take 4mg  M,W,F and take 5mg  all other days (Sun,Tue,Thur,Sat)

## 2018-06-22 NOTE — Telephone Encounter (Signed)
Please review. Thanks!  

## 2018-06-23 MED ORDER — WARFARIN SODIUM 1 MG PO TABS: 1.0000 mg | ORAL_TABLET | Freq: Every day | ORAL | 12 refills | Status: DC

## 2018-07-06 ENCOUNTER — Ambulatory Visit: Payer: Medicare Other

## 2018-07-06 DIAGNOSIS — I4891 Unspecified atrial fibrillation: Secondary | ICD-10-CM | POA: Diagnosis not present

## 2018-07-06 LAB — POCT INR
INR: 2.1 (ref 2.0–3.0)
PT: 24.8

## 2018-07-06 NOTE — Patient Instructions (Signed)
Description   Take 4mg  M,W,F and take 5mg  all other days (Sun,Tue,Thur,Sat)

## 2018-07-18 ENCOUNTER — Other Ambulatory Visit: Payer: Self-pay | Admitting: Family Medicine

## 2018-07-18 MED ORDER — NYSTATIN 100000 UNIT/GM EX POWD: Freq: Four times a day (QID) | CUTANEOUS | 0 refills | Status: DC

## 2018-07-18 NOTE — Telephone Encounter (Signed)
Please review. Thanks!  

## 2018-07-18 NOTE — Telephone Encounter (Signed)
[facility] faxed refill request for the following medications:  Nystatin 100,000 unit/gm powd  Directions: apply topically to affected area(s) 3 times a day  Qty: 60  Last fill date: 08/25/2017  Please advise.

## 2018-07-20 ENCOUNTER — Ambulatory Visit: Payer: Medicare Other | Admitting: Family Medicine

## 2018-07-20 ENCOUNTER — Encounter: Payer: Self-pay | Admitting: Family Medicine

## 2018-07-20 VITALS — BP 134/76 | HR 72 | Temp 98.6°F | Resp 16 | Ht 67.0 in | Wt 176.0 lb

## 2018-07-20 DIAGNOSIS — C49A2 Gastrointestinal stromal tumor of stomach: Secondary | ICD-10-CM

## 2018-07-20 DIAGNOSIS — M170 Bilateral primary osteoarthritis of knee: Secondary | ICD-10-CM

## 2018-07-20 DIAGNOSIS — E1121 Type 2 diabetes mellitus with diabetic nephropathy: Secondary | ICD-10-CM

## 2018-07-20 DIAGNOSIS — I10 Essential (primary) hypertension: Secondary | ICD-10-CM | POA: Diagnosis not present

## 2018-07-20 DIAGNOSIS — I4891 Unspecified atrial fibrillation: Secondary | ICD-10-CM

## 2018-07-20 DIAGNOSIS — F981 Encopresis not due to a substance or known physiological condition: Secondary | ICD-10-CM

## 2018-07-20 DIAGNOSIS — E039 Hypothyroidism, unspecified: Secondary | ICD-10-CM

## 2018-07-20 DIAGNOSIS — I509 Heart failure, unspecified: Secondary | ICD-10-CM | POA: Diagnosis not present

## 2018-07-20 DIAGNOSIS — G629 Polyneuropathy, unspecified: Secondary | ICD-10-CM

## 2018-07-20 LAB — POCT GLYCOSYLATED HEMOGLOBIN (HGB A1C)
Est. average glucose Bld gHb Est-mCnc: 166
Hemoglobin A1C: 7.4 % — AB (ref 4.0–5.6)

## 2018-07-20 LAB — POCT INR
INR: 2.7 (ref 2.0–3.0)
PT: 32.2

## 2018-07-20 NOTE — Patient Instructions (Signed)
Discontinue Celebrex

## 2018-07-20 NOTE — Progress Notes (Signed)
Patient: Dawn Burnett Female    DOB: Feb 06, 1929   84 y.o.   MRN: [id number] Visit Date: 07/20/2018  Today's Provider: [name], MD   Chief Complaint  Patient presents with  . Diabetes  . Atrial Fibrillation  . Congestive Heart Failure   Subjective:    HPI  Diabetes Mellitus Type II, Follow-up:   Lab Results  Component Value Date   HGBA1C 7.4 (A) 07/20/2018   HGBA1C 7.0 (A) 04/20/2018   HGBA1C 7.5 (H) 12/09/2017    Last seen for diabetes 3 months ago.  Management since then includes no changes. She reports good compliance with treatment. She is not having side effects.  Current symptoms include none and have been stable. Home blood sugar records: fasting range: not being checked  Episodes of hypoglycemia? no   Current Insulin Regimen: none Most Recent Eye Exam: up to date Weight trend: stable Prior visit with dietician: no Current diet: well balanced Current exercise: no regular exercise  Pertinent Labs:    Component Value Date/Time   CHOL 174 12/14/2016 0900   CHOL 319 (H) 01/30/2013 0052   TRIG 263 (H) 12/14/2016 0900   TRIG 678 (H) 01/30/2013 0052   HDL 44 12/14/2016 0900   HDL 42 01/30/2013 0052   LDLCALC 77 12/14/2016 0900   LDLCALC SEE COMMENT 01/30/2013 0052   CREATININE 0.95 12/09/2017 1052   CREATININE 1.07 07/10/2014 1022    Wt Readings from Last 3 Encounters:  07/20/18 176 lb (79.8 kg)  04/20/18 169 lb (76.7 kg)  03/28/18 169 lb (76.7 kg)   A-fib, follow up: Patient's last coumadin check was on 07/06/18. No medications were adjusted on last visit. She is currently taking 5mg  QD except 4mg  on MWF.   Lab Results  Component Value Date   INR 2.7 07/20/2018   INR 2.1 07/06/2018   INR 1.7 (A) 06/22/2018   PROTIME 28.3 03/20/2015   PROTIME 2.7 (A) 11/27/2014        Allergies  Allergen Reactions  . Iodinated Diagnostic Agents Rash  . Augmentin [Amoxicillin-Pot Clavulanate] Other (See Comments)    Gi distress   .  Codeine   . Crestor [Rosuvastatin] Other (See Comments)    Unknown Unknown  . Levaquin [Levofloxacin In D5w]     Stomach pain, body pain/ache all over Stomach pain, body pain/ache all over  . Naproxen Other (See Comments)    unknown unknown  . Prednisone   . Sulfa Antibiotics   . Sulfacetamide Sodium   . Tolectin [Tolmetin]      Current Outpatient Medications:  .  acetaminophen (TYLENOL) 500 MG tablet, Take 500 mg by mouth every 6 (six) hours as needed for mild pain, moderate pain, fever or headache., Disp: , Rfl:  .  atorvastatin (LIPITOR) 20 MG tablet, Take 20 mg by mouth at bedtime. , Disp: , Rfl:  .  Calcium-Vitamin D 600-200 MG-UNIT per tablet, Take 600 tablets by mouth daily with supper. , Disp: , Rfl:  .  celecoxib (CELEBREX) 200 MG capsule, TAKE ONE CAPSULE BY MOUTH DAILY, Disp: 90 capsule, Rfl: 2 .  COUMADIN 1 MG tablet, TAKE 1 (ONE) TABLET TABLET, ORAL, DAILY AS DIRECTED, Disp: 30 tablet, Rfl: 11 .  COUMADIN 4 MG tablet, TAKE 1 TABLET AS DIRECTED, Disp: 30 tablet, Rfl: 10 .  diltiazem (CARDIZEM CD) 120 MG 24 hr capsule, Take 120 mg by mouth daily., Disp: , Rfl:  .  diphenoxylate-atropine (LOMOTIL) 2.5-0.025 MG tablet, Take 1 tablet  by mouth daily as needed for diarrhea or loose stools., Disp: 14 tablet, Rfl: 0 .  ENTRESTO 24-26 MG, Take 1 tablet by mouth 2 (two) times daily. , Disp: , Rfl:  .  furosemide (LASIX) 20 MG tablet, Take 20 mg by mouth daily. , Disp: , Rfl:  .  gabapentin (NEURONTIN) 600 MG tablet, TAKE ONE TABLET BY MOUTH THREE TIMES A DAY, Disp: 90 tablet, Rfl: 10 .  imipramine (TOFRANIL) 25 MG tablet, Take 1 tablet (25 mg total) by mouth at bedtime., Disp: 30 tablet, Rfl: 5 .  loperamide (IMODIUM) 2 MG capsule, Take by mouth., Disp: , Rfl:  .  metoprolol (LOPRESSOR) 100 MG tablet, Take 100 mg by mouth 2 (two) times daily. , Disp: , Rfl:  .  mirabegron ER (MYRBETRIQ) 50 MG TB24 tablet, Take 50 mg by mouth., Disp: , Rfl:  .  Multiple Vitamins-Minerals (CENTRUM  SILVER ULTRA WOMENS PO), Take 1 tablet by mouth daily with lunch. , Disp: , Rfl:  .  nystatin (MYCOSTATIN/NYSTOP) powder, Apply topically 4 (four) times daily., Disp: 15 g, Rfl: 0 .  nystatin cream (MYCOSTATIN), APPLY  TOPICALLY 2 (TWO) TIMES DAILY. APPLY TO AFFECTED AREA (Patient taking differently: as needed. APPLY  TOPICALLY 2 (TWO) TIMES DAILY. APPLY TO AFFECTED AREA), Disp: 30 g, Rfl: 1 .  omeprazole (PRILOSEC) 20 MG capsule, TAKE ONE CAPSULE DAILY, Disp: 90 capsule, Rfl: 0 .  ranitidine (ZANTAC) 150 MG tablet, TAKE 1 TABLET BY MOUTH TWO TIMES DAILY, Disp: 180 tablet, Rfl: 9 .  SYNTHROID 50 MCG tablet, TAKE 1 TABLET (50 MCG TOTAL) BY MOUTH DAILY BEFORE BREAKFAST., Disp: 90 tablet, Rfl: 2 .  warfarin (COUMADIN) 1 MG tablet, Take 1 tablet (1 mg total) by mouth daily., Disp: 30 tablet, Rfl: 12 .  warfarin (COUMADIN) 3 MG tablet, Take 1 tablet (3 mg total) by mouth daily., Disp: 30 tablet, Rfl: 12  Review of Systems  Constitutional: Positive for fatigue. Negative for activity change, appetite change, chills, diaphoresis, fever and unexpected weight change.  Eyes: Negative.   Respiratory: Negative for shortness of breath and stridor.   Cardiovascular: Positive for palpitations. Negative for chest pain and leg swelling.  Gastrointestinal: Negative.   Endocrine: Negative for cold intolerance, heat intolerance, polydipsia, polyphagia and polyuria.  Musculoskeletal: Positive for arthralgias, back pain, gait problem and myalgias.  Skin: Positive for rash.  Allergic/Immunologic: Negative.   Neurological: Negative for dizziness, light-headedness and headaches.  Psychiatric/Behavioral: Negative.     Social History   Tobacco Use  . Smoking status: Never Smoker  . Smokeless tobacco: Never Used  Substance Use Topics  . Alcohol use: No   Objective:   BP 134/76 (BP Location: Left Arm, Patient Position: Sitting, Cuff Size: Normal)   Pulse 72   Temp 98.6 F (37 C)   Resp 16   Ht 5\' 7"  (1.702 m)    Wt 176 lb (79.8 kg)   SpO2 97%   BMI 27.57 kg/m  Vitals:   07/20/18 1054  BP: 134/76  Pulse: 72  Resp: 16  Temp: 98.6 F (37 C)  SpO2: 97%  Weight: 176 lb (79.8 kg)  Height: 5\' 7"  (1.702 m)     Physical Exam Constitutional:      Appearance: She is well-developed.  HENT:     Head: Normocephalic and atraumatic.     Right Ear: External ear normal.     Left Ear: External ear normal.     Nose: Nose normal.  Eyes:     General:  No scleral icterus.    Conjunctiva/sclera: Conjunctivae normal.  Neck:     Thyroid: No thyromegaly.  Cardiovascular:     Rate and Rhythm: Normal rate and regular rhythm.     Heart sounds: Normal heart sounds.  Pulmonary:     Effort: Pulmonary effort is normal.     Breath sounds: Normal breath sounds.  Abdominal:     Palpations: Abdomen is soft.  Lymphadenopathy:     Cervical: No cervical adenopathy.  Skin:    General: Skin is warm and dry.  Neurological:     General: No focal deficit present.     Mental Status: She is alert and oriented to person, place, and time.     Comments: Foot exam--pt cannot feel thick monofilament.  Psychiatric:        Mood and Affect: Mood normal.        Behavior: Behavior normal.        Thought Content: Thought content normal.        Judgment: Judgment normal.         Assessment & Plan:     1. Type 2 diabetes mellitus with diabetic nephropathy, without long-term current use of insulin (HCC) No changes with good control of diabetes and 84 year old.  Avoid hypoglycemia. - POCT glycosylated hemoglobin (Hb A1C)--7.4 today  2. Atrial fibrillation, unspecified type (HCC) Lifelong anticoagulation. - POCT INR  3. Essential (primary) hypertension Controlled.  4. Chronic congestive heart failure, unspecified heart failure type ([place]) No CHF today.  5. 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 Adult hypothyroidism   7. Neuropathy   8. Primary osteoarthritis of both  knees Patient has had for years she cannot do without Celebrex.  I cannot continue to give her Celebrex with chronic anticoagulation.  DC this today.  Splane to her the risk of GI bleed is way too high in an 84 year old.  She says she is willing to take the risk but I am not willing to continue this.  9. Psychogenic fecal incontinence       I have done the exam and reviewed the above chart and it is accurate to the best of my knowledge. Development worker, community has been used in this note in any air is in the dictation or transcription are unintentional.  [name], MD  [place]

## 2018-07-25 ENCOUNTER — Encounter: Payer: Self-pay | Admitting: Podiatry

## 2018-07-25 ENCOUNTER — Ambulatory Visit: Payer: Medicare Other | Admitting: Podiatry

## 2018-07-25 DIAGNOSIS — E1142 Type 2 diabetes mellitus with diabetic polyneuropathy: Secondary | ICD-10-CM | POA: Diagnosis not present

## 2018-07-25 DIAGNOSIS — B351 Tinea unguium: Secondary | ICD-10-CM | POA: Diagnosis not present

## 2018-07-25 DIAGNOSIS — M79676 Pain in unspecified toe(s): Secondary | ICD-10-CM

## 2018-07-25 DIAGNOSIS — D689 Coagulation defect, unspecified: Secondary | ICD-10-CM | POA: Diagnosis not present

## 2018-07-25 DIAGNOSIS — M2011 Hallux valgus (acquired), right foot: Secondary | ICD-10-CM | POA: Diagnosis not present

## 2018-07-25 NOTE — Progress Notes (Signed)
Complaint:  Visit Type: Patient returns to my office for continued preventative foot care services. Complaint: Patient states" my nails have grown long and thick and become painful to walk and wear shoes" Patient has been diagnosed with hyperglycemia with neuropathy.. The patient presents for preventative foot care services. No changes to ROS.  Patient says she has pain in bunion right foot.  Podiatric Exam: Vascular: dorsalis pedis and posterior tibial pulses are palpable bilateral. Capillary return is immediate. Temperature gradient is WNL. Skin turgor WNL  Dorsal skin lesions due to vascular. Sensorium: Normal Semmes Weinstein monofilament test. Normal tactile sensation bilaterally. Nail Exam: Pt has thick disfigured discolored nails with subungual debris noted bilateral entire nail hallux through fifth toenails Ulcer Exam: There is no evidence of ulcer or pre-ulcerative changes or infection. Orthopedic Exam: Muscle tone and strength are WNL. No limitations in general ROM. No crepitus or effusions noted. Foot type and digits show no abnormalities.  HAV  B/L Skin: No Porokeratosis. No infection or ulcers  Diagnosis:  Onychomycosis, , Pain in right toe, pain in left toes  Treatment & Plan Procedures and Treatment: Consent by patient was obtained for treatment procedures. The patient understood the discussion of treatment and procedures well. All questions were answered thoroughly reviewed. Debridement of mycotic and hypertrophic toenails, 1 through 5 bilateral and clearing of subungual debris. No ulceration, no infection noted. Told patient to apply lidocaine cream for pain.  Also told her her compression socks can be aggravating her bunion right foot. Return Visit-Office Procedure: Patient instructed to return to the office for a follow up visit 3 months for continued evaluation and treatment.    Gardiner Barefoot DPM

## 2018-07-27 ENCOUNTER — Ambulatory Visit: Payer: Self-pay

## 2018-07-28 ENCOUNTER — Ambulatory Visit: Payer: Medicare Other | Admitting: Podiatry

## 2018-07-29 ENCOUNTER — Other Ambulatory Visit: Payer: Self-pay

## 2018-07-29 ENCOUNTER — Inpatient Hospital Stay (HOSPITAL_BASED_OUTPATIENT_CLINIC_OR_DEPARTMENT_OTHER): Payer: Medicare Other | Admitting: Oncology

## 2018-07-29 ENCOUNTER — Inpatient Hospital Stay: Payer: Medicare Other | Attending: Oncology

## 2018-07-29 VITALS — BP 131/72 | HR 72 | Temp 97.5°F | Wt 175.1 lb

## 2018-07-29 DIAGNOSIS — Z7901 Long term (current) use of anticoagulants: Secondary | ICD-10-CM | POA: Insufficient documentation

## 2018-07-29 DIAGNOSIS — I4891 Unspecified atrial fibrillation: Secondary | ICD-10-CM | POA: Insufficient documentation

## 2018-07-29 DIAGNOSIS — Z79899 Other long term (current) drug therapy: Secondary | ICD-10-CM | POA: Insufficient documentation

## 2018-07-29 DIAGNOSIS — Z85028 Personal history of other malignant neoplasm of stomach: Secondary | ICD-10-CM | POA: Diagnosis present

## 2018-07-29 DIAGNOSIS — Z8509 Personal history of malignant neoplasm of other digestive organs: Secondary | ICD-10-CM

## 2018-07-29 DIAGNOSIS — C49A2 Gastrointestinal stromal tumor of stomach: Secondary | ICD-10-CM

## 2018-07-29 LAB — CBC WITH DIFFERENTIAL/PLATELET
Abs Immature Granulocytes: 0.02 10*3/uL (ref 0.00–0.07)
Basophils Absolute: 0 10*3/uL (ref 0.0–0.1)
Basophils Relative: 0 %
Eosinophils Absolute: 0.1 10*3/uL (ref 0.0–0.5)
Eosinophils Relative: 1 %
HCT: 39.3 % (ref 36.0–46.0)
Hemoglobin: 13 g/dL (ref 12.0–15.0)
Immature Granulocytes: 0 %
Lymphocytes Relative: 26 %
Lymphs Abs: 1.8 10*3/uL (ref 0.7–4.0)
MCH: 32.6 pg (ref 26.0–34.0)
MCHC: 33.1 g/dL (ref 30.0–36.0)
MCV: 98.5 fL (ref 80.0–100.0)
Monocytes Absolute: 0.4 10*3/uL (ref 0.1–1.0)
Monocytes Relative: 6 %
Neutro Abs: 4.5 10*3/uL (ref 1.7–7.7)
Neutrophils Relative %: 67 %
Platelets: 133 10*3/uL — ABNORMAL LOW (ref 150–400)
RBC: 3.99 MIL/uL (ref 3.87–5.11)
RDW: 13.3 % (ref 11.5–15.5)
WBC: 6.9 10*3/uL (ref 4.0–10.5)
nRBC: 0 % (ref 0.0–0.2)

## 2018-07-29 LAB — COMPREHENSIVE METABOLIC PANEL
ALT: 13 U/L (ref 0–44)
AST: 19 U/L (ref 15–41)
Albumin: 3.9 g/dL (ref 3.5–5.0)
Alkaline Phosphatase: 47 U/L (ref 38–126)
Anion gap: 10 (ref 5–15)
BUN: 22 mg/dL (ref 8–23)
CO2: 30 mmol/L (ref 22–32)
Calcium: 9.1 mg/dL (ref 8.9–10.3)
Chloride: 104 mmol/L (ref 98–111)
Creatinine, Ser: 1.01 mg/dL — ABNORMAL HIGH (ref 0.44–1.00)
GFR calc Af Amer: 58 mL/min — ABNORMAL LOW (ref >60)
GFR calc non Af Amer: 50 mL/min — ABNORMAL LOW (ref >60)
Glucose, Bld: 207 mg/dL — ABNORMAL HIGH (ref 70–99)
Potassium: 4.3 mmol/L (ref 3.5–5.1)
Sodium: 144 mmol/L (ref 135–145)
Total Bilirubin: 1.1 mg/dL (ref 0.3–1.2)
Total Protein: 6.5 g/dL (ref 6.5–8.1)

## 2018-07-29 NOTE — Progress Notes (Signed)
Patient stated that she currently has an UTI but is currently taking antibiotics per Dr. Rosanna Randy (PCP).

## 2018-08-01 ENCOUNTER — Encounter: Payer: Self-pay | Admitting: Oncology

## 2018-08-01 NOTE — Progress Notes (Signed)
Hematology/Oncology Consult note [facility]  Telephone:([phone] [phone]  Patient Care Team: Jerrol Banana., MD as PCP - General (Family Medicine) Oneta Rack, MD as Consulting Physician (Dermatology) Gardiner Barefoot, DPM as Consulting Physician (Podiatry) Margaretha Sheffield, MD as Consulting Physician (Otolaryngology) Dionisio David, MD as Consulting Physician (Cardiology)   Name of the patient: Dawn Burnett  [id number]  11-30-1928   Date of visit: 08/01/18  Diagnosis-  GIST tumor status post resection  Chief complaint/ Reason for visit-routine follow-up of gist tumor  Heme/Onc history: Pathology showed 8 cm just tumor involving posterior wall of the stomach. Margins were negative and one lymph node was negative for malignancy. Mitotic rate was 1 per 5 mm. Spindle type low-grade gist  During her visit in June 2017 patient decided to no longer get surveillance CT scans done due to her age and follow up with [place] on a yearly basis to monitor clinical signs and symptoms. CT scan in June 2017 showed stable rounded soft tissue mass 41 x 28 mm in size in the gastrosplenic ligament. No evidence of new peritoneal and mesenteric or retroperitoneal lesion.  Interval history-overall she is doing well for her age and reports no recent falls or hospitalization.  Denies any abdominal pain nausea or vomiting.  ECOG PS- 2 Pain scale- 0 Opioid associated constipation- no  Review of systems- Review of Systems  Constitutional: Positive for malaise/fatigue. Negative for chills, fever and weight loss.  HENT: Negative for congestion, ear discharge and nosebleeds.   Eyes: Negative for blurred vision.  Respiratory: Negative for cough, hemoptysis, sputum production, shortness of breath and wheezing.   Cardiovascular: Negative for chest pain, palpitations, orthopnea and claudication.  Gastrointestinal: Negative for abdominal pain, blood in stool,  constipation, diarrhea, heartburn, melena, nausea and vomiting.  Genitourinary: Negative for dysuria, flank pain, frequency, hematuria and urgency.  Musculoskeletal: Negative for back pain, joint pain and myalgias.  Skin: Negative for rash.  Neurological: Negative for dizziness, tingling, focal weakness, seizures, weakness and headaches.  Endo/Heme/Allergies: Does not bruise/bleed easily.  Psychiatric/Behavioral: Negative for depression and suicidal ideas. The patient does not have insomnia.      Allergies  Allergen Reactions  . Iodinated Diagnostic Agents Rash  . Augmentin [Amoxicillin-Pot Clavulanate] Other (See Comments)    Gi distress   . Codeine   . Crestor [Rosuvastatin] Other (See Comments)    Unknown Unknown  . Levaquin [Levofloxacin In D5w]     Stomach pain, body pain/ache all over Stomach pain, body pain/ache all over  . Naproxen Other (See Comments)    unknown unknown  . Prednisone   . Sulfa Antibiotics   . Sulfacetamide Sodium   . Tolectin [Tolmetin]      Past Medical History:  Diagnosis Date  . Arthritis   . Atrial fibrillation ([place])   . CHF (congestive heart failure) ([place])   . GERD (gastroesophageal reflux disease)   . Hiatal hernia   . Hypertension   . Skin cancer, basal cell   . Thyroid disease      Past Surgical History:  Procedure Laterality Date  . ABDOMINAL HYSTERECTOMY    . APPENDECTOMY    . BREAST SURGERY    . cateract extraction    . EYE SURGERY     done by Dr. Vickki Muff  . KNEE SURGERY    . 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 right hip surgery    . right total knee replacement  Social History   Socioeconomic History  . Marital status: Widowed    Spouse name: widowed  . Number of children: 2  . Years of education: 48  . Highest education level: 12th grade  Occupational History  . Occupation: retired  Scientific laboratory technician  . Financial resource strain: Not hard at all  . Food insecurity:    Worry:  Never true    Inability: Never true  . Transportation needs:    Medical: No    Non-medical: No  Tobacco Use  . Smoking status: Never Smoker  . Smokeless tobacco: Never Used  Substance and Sexual Activity  . Alcohol use: No  . Drug use: No  . Sexual activity: Never  Lifestyle  . Physical activity:    Days per week: Not on file    Minutes per session: Not on file  . Stress: Not at all  Relationships  . Social connections:    Talks on phone: Not on file    Gets together: Not on file    Attends religious service: Not on file    Active member of club or organization: Not on file    Attends meetings of clubs or organizations: Not on file    Relationship status: Not on file  . Intimate partner violence:    Fear of current or ex partner: Not on file    Emotionally abused: Not on file    Physically abused: Not on file    Forced sexual activity: Not on file  Other Topics Concern  . Not on file  Social History Narrative  . Not on file    Family History  Problem Relation Age of Onset  . Heart disease Mother   . Hypertension Mother   . Heart attack Mother   . Stomach cancer Father   . Liver cancer Brother   . Hypertension Son   . Huntington's disease Sister   . Diabetes Sister   . Kidney disease Sister   . Congestive Heart Failure Sister   . Pneumonia Sister   . Stroke Brother   . Parkinson's disease Brother   . Hypertension Brother   . Congestive Heart Failure Brother      Current Outpatient Medications:  .  acetaminophen (TYLENOL) 500 MG tablet, Take 500 mg by mouth every 6 (six) hours as needed for mild pain, moderate pain, fever or headache., Disp: , Rfl:  .  atorvastatin (LIPITOR) 20 MG tablet, Take 20 mg by mouth at bedtime. , Disp: , Rfl:  .  Calcium-Vitamin D 600-200 MG-UNIT per tablet, Take 600 tablets by mouth daily with supper. , Disp: , Rfl:  .  celecoxib (CELEBREX) 200 MG capsule, TAKE ONE CAPSULE BY MOUTH DAILY, Disp: 90 capsule, Rfl: 2 .  COUMADIN 1 MG  tablet, TAKE 1 (ONE) TABLET TABLET, ORAL, DAILY AS DIRECTED, Disp: 30 tablet, Rfl: 11 .  COUMADIN 4 MG tablet, TAKE 1 TABLET AS DIRECTED, Disp: 30 tablet, Rfl: 10 .  diltiazem (CARDIZEM CD) 120 MG 24 hr capsule, Take 120 mg by mouth daily., Disp: , Rfl:  .  diphenoxylate-atropine (LOMOTIL) 2.5-0.025 MG tablet, Take 1 tablet by mouth daily as needed for diarrhea or loose stools., Disp: 14 tablet, Rfl: 0 .  furosemide (LASIX) 20 MG tablet, Take 20 mg by mouth daily. , Disp: , Rfl:  .  gabapentin (NEURONTIN) 600 MG tablet, TAKE ONE TABLET BY MOUTH THREE TIMES A DAY, Disp: 90 tablet, Rfl: 10 .  imipramine (TOFRANIL) 25 MG tablet, Take 1 tablet (25  mg total) by mouth at bedtime., Disp: 30 tablet, Rfl: 5 .  loperamide (IMODIUM) 2 MG capsule, Take by mouth., Disp: , Rfl:  .  metoprolol (LOPRESSOR) 100 MG tablet, Take 100 mg by mouth 2 (two) times daily. , Disp: , Rfl:  .  mirabegron ER (MYRBETRIQ) 50 MG TB24 tablet, Take 50 mg by mouth., Disp: , Rfl:  .  Multiple Vitamins-Minerals (CENTRUM SILVER ULTRA WOMENS PO), Take 1 tablet by mouth daily with lunch. , Disp: , Rfl:  .  nystatin (MYCOSTATIN/NYSTOP) powder, Apply topically 4 (four) times daily., Disp: 15 g, Rfl: 0 .  nystatin cream (MYCOSTATIN), APPLY  TOPICALLY 2 (TWO) TIMES DAILY. APPLY TO AFFECTED AREA (Patient taking differently: as needed. APPLY  TOPICALLY 2 (TWO) TIMES DAILY. APPLY TO AFFECTED AREA), Disp: 30 g, Rfl: 1 .  omeprazole (PRILOSEC) 20 MG capsule, TAKE ONE CAPSULE DAILY, Disp: 90 capsule, Rfl: 0 .  ranitidine (ZANTAC) 150 MG tablet, TAKE 1 TABLET BY MOUTH TWO TIMES DAILY, Disp: 180 tablet, Rfl: 9 .  SYNTHROID 50 MCG tablet, TAKE 1 TABLET (50 MCG TOTAL) BY MOUTH DAILY BEFORE BREAKFAST., Disp: 90 tablet, Rfl: 2 .  UNABLE TO FIND, APPLY TO THE AFFECTED AREA(S) TWICE DAILY ON RIGHT CHEEK FOR 10 DAYS, Disp: , Rfl: 0 .  warfarin (COUMADIN) 1 MG tablet, Take 1 tablet (1 mg total) by mouth daily., Disp: 30 tablet, Rfl: 12 .  warfarin (COUMADIN)  3 MG tablet, Take 1 tablet (3 mg total) by mouth daily., Disp: 30 tablet, Rfl: 12 .  ENTRESTO 97-103 MG, Take 1 tablet by mouth 1 day or 1 dose., Disp: , Rfl:   Physical exam:  Vitals:   07/29/18 1139  BP: 131/72  Pulse: 72  Temp: (!) 97.5 F (36.4 C)  TempSrc: Tympanic  Weight: 175 lb 1.6 oz (79.4 kg)   Physical Exam Constitutional:      Comments: Elderly frail woman in no acute distress  HENT:     Head: Normocephalic and atraumatic.  Eyes:     Pupils: Pupils are equal, round, and reactive to light.  Neck:     Musculoskeletal: Normal range of motion.  Cardiovascular:     Rate and Rhythm: Normal rate and regular rhythm.     Heart sounds: Normal heart sounds.  Pulmonary:     Effort: Pulmonary effort is normal.     Breath sounds: Normal breath sounds.  Abdominal:     General: Bowel sounds are normal.     Palpations: Abdomen is soft.  Skin:    General: Skin is warm and dry.  Neurological:     Mental Status: She is alert and oriented to person, place, and time.      CMP Latest Ref Rng & Units 07/29/2018  Glucose 70 - 99 mg/dL 207(H)  BUN 8 - 23 mg/dL 22  Creatinine 0.44 - 1.00 mg/dL 1.01(H)  Sodium 135 - 145 mmol/L 144  Potassium 3.5 - 5.1 mmol/L 4.3  Chloride 98 - 111 mmol/L 104  CO2 22 - 32 mmol/L 30  Calcium 8.9 - 10.3 mg/dL 9.1  Total Protein 6.5 - 8.1 g/dL 6.5  Total Bilirubin 0.3 - 1.2 mg/dL 1.1  Alkaline Phos 38 - 126 U/L 47  AST 15 - 41 U/L 19  ALT 0 - 44 U/L 13   CBC Latest Ref Rng & Units 07/29/2018  WBC 4.0 - 10.5 K/uL 6.9  Hemoglobin 12.0 - 15.0 g/dL 13.0  Hematocrit 36.0 - 46.0 % 39.3  Platelets 150 - 400  K/uL 133(L)     Assessment and plan- Patient is a 84 y.o. 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  She is here for routine follow-up of her gist tumor   Clinically patient is doing well and reports no new abdominal symptoms.  Patient was found to have  a 4 cm mass in the gastrohepatic ligament based on CT scan in 2017 which was stable as compared to the scan 6 months ago.  Since 2017 patient has not had a surveillance scan as patient did not want to undergo surveillance imaging given her age and frailty.  She does not wish any surveillance scans at this time as well.  Therefore feel the patient will continue to follow-up with her primary care doctor for her routine medical issues.  If she were to develop any new gastrointestinal signs and symptoms a repeat scan may be warranted at that time.  Even if she were not to be a surgical candidate other treatment such as palliative radiation may be considered based on pathology if warranted   Visit Diagnosis No diagnosis found.   Dr. Randa Evens, MD, MPH [facility] at [facility] [phone] 08/01/2018 12:57 PM

## 2018-08-05 ENCOUNTER — Encounter: Payer: Self-pay | Admitting: Family Medicine

## 2018-08-05 ENCOUNTER — Ambulatory Visit: Payer: Medicare Other | Admitting: Family Medicine

## 2018-08-05 VITALS — BP 142/70 | HR 88 | Temp 98.4°F | Resp 16 | Wt 173.0 lb

## 2018-08-05 DIAGNOSIS — L84 Corns and callosities: Secondary | ICD-10-CM

## 2018-08-05 MED ORDER — MUPIROCIN CALCIUM 2 % EX CREA: 1.0000 "application " | TOPICAL_CREAM | Freq: Every day | CUTANEOUS | 0 refills | Status: DC

## 2018-08-05 NOTE — Patient Instructions (Signed)
.   Please bring all of your medications to every appointment so we can make sure that our medication list is the same as yours.   

## 2018-08-05 NOTE — Progress Notes (Signed)
Patient: Dawn Burnett Female    DOB: 01/31/1929   84 y.o.   MRN: [id number] Visit Date: 08/05/2018  Today's Provider: Lelon Huh, MD   Chief Complaint  Patient presents with  . Foot Pain   Subjective:     HPI  Patient comes in today c/o pain in her right foot X 1 week. Patient reports that it is a bunion that is progressively getting worse. She reports that it has been minimal drainage from her foot. She has also had redness and swelling. She had been trying to keep adhesive bandages over area, but it pull skin off bottom of her foot when she took it off. She started using bunion pad a few days ago without any problems.    Allergies  Allergen Reactions  . Iodinated Diagnostic Agents Rash  . Augmentin [Amoxicillin-Pot Clavulanate] Other (See Comments)    Gi distress   . Codeine   . Crestor [Rosuvastatin] Other (See Comments)    Unknown Unknown  . Levaquin [Levofloxacin In D5w]     Stomach pain, body pain/ache all over Stomach pain, body pain/ache all over  . Naproxen Other (See Comments)    unknown unknown  . Prednisone   . Sulfa Antibiotics   . Sulfacetamide Sodium   . Tolectin [Tolmetin]      Current Outpatient Medications:  .  acetaminophen (TYLENOL) 500 MG tablet, Take 500 mg by mouth every 6 (six) hours as needed for mild pain, moderate pain, fever or headache., Disp: , Rfl:  .  atorvastatin (LIPITOR) 20 MG tablet, Take 20 mg by mouth at bedtime. , Disp: , Rfl:  .  Calcium-Vitamin D 600-200 MG-UNIT per tablet, Take 600 tablets by mouth daily with supper. , Disp: , Rfl:  .  celecoxib (CELEBREX) 200 MG capsule, TAKE ONE CAPSULE BY MOUTH DAILY, Disp: 90 capsule, Rfl: 2 .  COUMADIN 1 MG tablet, TAKE 1 (ONE) TABLET TABLET, ORAL, DAILY AS DIRECTED, Disp: 30 tablet, Rfl: 11 .  COUMADIN 4 MG tablet, TAKE 1 TABLET AS DIRECTED, Disp: 30 tablet, Rfl: 10 .  diltiazem (CARDIZEM CD) 120 MG 24 hr capsule, Take 120 mg by mouth daily., Disp: , Rfl:  .   diphenoxylate-atropine (LOMOTIL) 2.5-0.025 MG tablet, Take 1 tablet by mouth daily as needed for diarrhea or loose stools., Disp: 14 tablet, Rfl: 0 .  ENTRESTO 97-103 MG, Take 1 tablet by mouth 1 day or 1 dose., Disp: , Rfl:  .  furosemide (LASIX) 20 MG tablet, Take 20 mg by mouth daily. , Disp: , Rfl:  .  gabapentin (NEURONTIN) 600 MG tablet, TAKE ONE TABLET BY MOUTH THREE TIMES A DAY, Disp: 90 tablet, Rfl: 10 .  imipramine (TOFRANIL) 25 MG tablet, Take 1 tablet (25 mg total) by mouth at bedtime., Disp: 30 tablet, Rfl: 5 .  loperamide (IMODIUM) 2 MG capsule, Take by mouth., Disp: , Rfl:  .  metoprolol (LOPRESSOR) 100 MG tablet, Take 100 mg by mouth 2 (two) times daily. , Disp: , Rfl:  .  mirabegron ER (MYRBETRIQ) 50 MG TB24 tablet, Take 50 mg by mouth., Disp: , Rfl:  .  Multiple Vitamins-Minerals (CENTRUM SILVER ULTRA WOMENS PO), Take 1 tablet by mouth daily with lunch. , Disp: , Rfl:  .  nystatin (MYCOSTATIN/NYSTOP) powder, Apply topically 4 (four) times daily., Disp: 15 g, Rfl: 0 .  nystatin cream (MYCOSTATIN), APPLY  TOPICALLY 2 (TWO) TIMES DAILY. APPLY TO AFFECTED AREA (Patient taking differently: as needed. APPLY  TOPICALLY  2 (TWO) TIMES DAILY. APPLY TO AFFECTED AREA), Disp: 30 g, Rfl: 1 .  omeprazole (PRILOSEC) 20 MG capsule, TAKE ONE CAPSULE DAILY, Disp: 90 capsule, Rfl: 0 .  ranitidine (ZANTAC) 150 MG tablet, TAKE 1 TABLET BY MOUTH TWO TIMES DAILY, Disp: 180 tablet, Rfl: 9 .  SYNTHROID 50 MCG tablet, TAKE 1 TABLET (50 MCG TOTAL) BY MOUTH DAILY BEFORE BREAKFAST., Disp: 90 tablet, Rfl: 2 .  UNABLE TO FIND, APPLY TO THE AFFECTED AREA(S) TWICE DAILY ON RIGHT CHEEK FOR 10 DAYS, Disp: , Rfl: 0 .  warfarin (COUMADIN) 1 MG tablet, Take 1 tablet (1 mg total) by mouth daily., Disp: 30 tablet, Rfl: 12 .  warfarin (COUMADIN) 3 MG tablet, Take 1 tablet (3 mg total) by mouth daily., Disp: 30 tablet, Rfl: 12  Review of Systems  Constitutional: Negative for appetite change, chills, diaphoresis and  fatigue.  Respiratory: Negative for cough and shortness of breath.   Cardiovascular: Negative for chest pain, palpitations and leg swelling.  Musculoskeletal: Positive for arthralgias and gait problem. Negative for joint swelling.  Skin: Positive for color change and wound.  Neurological: Negative for dizziness and light-headedness.    Social History   Tobacco Use  . Smoking status: Never Smoker  . Smokeless tobacco: Never Used  Substance Use Topics  . Alcohol use: No      Objective:   BP (!) 142/70 (BP Location: Left Arm, Patient Position: Sitting, Cuff Size: Normal)   Pulse 88   Temp 98.4 F (36.9 C)   Resp 16   Wt 173 lb (78.5 kg)   SpO2 95%   BMI 27.10 kg/m  Vitals:   08/05/18 1040  BP: (!) 142/70  Pulse: 88  Resp: 16  Temp: 98.4 F (36.9 C)  SpO2: 95%  Weight: 173 lb (78.5 kg)     Physical Exam  Ext: about right medial foot 1cm dm callus over lateral MTP with central crater and clear drainage. About 1-2 cm slightly erythematous ring around callous.     Assessment & Plan    1. Pre-ulcerative corn or callous Unable to use adhesive bandages, but has been able to use bunion pad over lesion the last two days without trouble. Advised to continue to keep lesion covered with bunion pad and apply- mupirocin cream (BACTROBAN) 2 %; Apply 1 application topically daily.  Dispense: 15 g; Refill: 0  Call if not greatly improved within a week.      Lelon Huh, MD  [facility]

## 2018-08-24 ENCOUNTER — Encounter: Payer: Self-pay | Admitting: Family Medicine

## 2018-08-24 ENCOUNTER — Ambulatory Visit: Payer: Medicare Other | Admitting: Family Medicine

## 2018-08-24 VITALS — BP 141/79 | HR 80 | Temp 98.9°F | Resp 20 | Ht 67.0 in | Wt 175.0 lb

## 2018-08-24 DIAGNOSIS — C49A2 Gastrointestinal stromal tumor of stomach: Secondary | ICD-10-CM

## 2018-08-24 DIAGNOSIS — L84 Corns and callosities: Secondary | ICD-10-CM | POA: Diagnosis not present

## 2018-08-24 DIAGNOSIS — L03115 Cellulitis of right lower limb: Secondary | ICD-10-CM | POA: Diagnosis not present

## 2018-08-24 DIAGNOSIS — E1121 Type 2 diabetes mellitus with diabetic nephropathy: Secondary | ICD-10-CM | POA: Diagnosis not present

## 2018-08-24 DIAGNOSIS — I4891 Unspecified atrial fibrillation: Secondary | ICD-10-CM

## 2018-08-24 DIAGNOSIS — M1990 Unspecified osteoarthritis, unspecified site: Secondary | ICD-10-CM

## 2018-08-24 LAB — POCT INR
INR: 2.3 (ref 2.0–3.0)
PT: 27.8

## 2018-08-24 MED ORDER — CEPHALEXIN 500 MG PO CAPS: 500.0000 mg | ORAL_CAPSULE | Freq: Two times a day (BID) | ORAL | 0 refills | Status: DC

## 2018-08-24 NOTE — Progress Notes (Signed)
Patient: Dawn Burnett Female    DOB: 12/06/1928   85 y.o.   MRN: [id number] Visit Date: 08/24/2018  Today's Provider: [name], MD   Chief Complaint  Patient presents with  . Follow-up   Subjective:     HPI   Patient comes in today for a follow up. She was last seen in the office 1 month ago. Since last visit, she was seen by Dr. Caryn Section for a painful callous on her right foot. She reports that she is still having pain and it makes it difficult for her to walk.  Her right foot is now swollen and tender--no trauma.,  Patient was also advised to discontinue Celebrex. Patient reports that she has done ok with this. She takes Tylenol instead.   Patient is also due for her PT/INR check today. She reports that she alternates Warfarin 4mg  and 5mg  daily. She denies any abnormal bleeding.  Lab Results  Component Value Date   INR 2.3 08/24/2018   INR 2.7 07/20/2018   INR 2.1 07/06/2018   PROTIME 28.3 03/20/2015   PROTIME 2.7 (A) 11/27/2014   BP Readings from Last 3 Encounters:  08/24/18 (!) 141/79  08/05/18 (!) 142/70  07/29/18 131/72   Wt Readings from Last 3 Encounters:  08/24/18 175 lb (79.4 kg)  08/05/18 173 lb (78.5 kg)  07/29/18 175 lb 1.6 oz (79.4 kg)     Allergies  Allergen Reactions  . Iodinated Diagnostic Agents Rash  . Augmentin [Amoxicillin-Pot Clavulanate] Other (See Comments)    Gi distress   . Codeine   . Crestor [Rosuvastatin] Other (See Comments)    Unknown Unknown  . Levaquin [Levofloxacin In D5w]     Stomach pain, body pain/ache all over Stomach pain, body pain/ache all over  . Naproxen Other (See Comments)    unknown unknown  . Prednisone   . Sulfa Antibiotics   . Sulfacetamide Sodium   . Tolectin [Tolmetin]      Current Outpatient Medications:  .  acetaminophen (TYLENOL) 500 MG tablet, Take 500 mg by mouth every 6 (six) hours as needed for mild pain, moderate pain, fever or headache., Disp: , Rfl:  .  atorvastatin (LIPITOR)  20 MG tablet, Take 20 mg by mouth at bedtime. , Disp: , Rfl:  .  Calcium-Vitamin D 600-200 MG-UNIT per tablet, Take 600 tablets by mouth daily with supper. , Disp: , Rfl:  .  celecoxib (CELEBREX) 200 MG capsule, TAKE ONE CAPSULE BY MOUTH DAILY, Disp: 90 capsule, Rfl: 2 .  COUMADIN 1 MG tablet, TAKE 1 (ONE) TABLET TABLET, ORAL, DAILY AS DIRECTED, Disp: 30 tablet, Rfl: 11 .  COUMADIN 4 MG tablet, TAKE 1 TABLET AS DIRECTED, Disp: 30 tablet, Rfl: 10 .  diltiazem (CARDIZEM CD) 120 MG 24 hr capsule, Take 120 mg by mouth daily., Disp: , Rfl:  .  diphenoxylate-atropine (LOMOTIL) 2.5-0.025 MG tablet, Take 1 tablet by mouth daily as needed for diarrhea or loose stools., Disp: 14 tablet, Rfl: 0 .  ENTRESTO 97-103 MG, Take 1 tablet by mouth 1 day or 1 dose., Disp: , Rfl:  .  furosemide (LASIX) 20 MG tablet, Take 20 mg by mouth daily. , Disp: , Rfl:  .  gabapentin (NEURONTIN) 600 MG tablet, TAKE ONE TABLET BY MOUTH THREE TIMES A DAY, Disp: 90 tablet, Rfl: 10 .  imipramine (TOFRANIL) 25 MG tablet, Take 1 tablet (25 mg total) by mouth at bedtime., Disp: 30 tablet, Rfl: 5 .  loperamide (  IMODIUM) 2 MG capsule, Take by mouth., Disp: , Rfl:  .  metoprolol (LOPRESSOR) 100 MG tablet, Take 100 mg by mouth 2 (two) times daily. , Disp: , Rfl:  .  mirabegron ER (MYRBETRIQ) 50 MG TB24 tablet, Take 50 mg by mouth., Disp: , Rfl:  .  Multiple Vitamins-Minerals (CENTRUM SILVER ULTRA WOMENS PO), Take 1 tablet by mouth daily with lunch. , Disp: , Rfl:  .  mupirocin cream (BACTROBAN) 2 %, Apply 1 application topically daily., Disp: 15 g, Rfl: 0 .  nystatin (MYCOSTATIN/NYSTOP) powder, Apply topically 4 (four) times daily., Disp: 15 g, Rfl: 0 .  nystatin cream (MYCOSTATIN), APPLY  TOPICALLY 2 (TWO) TIMES DAILY. APPLY TO AFFECTED AREA (Patient taking differently: as needed. APPLY  TOPICALLY 2 (TWO) TIMES DAILY. APPLY TO AFFECTED AREA), Disp: 30 g, Rfl: 1 .  omeprazole (PRILOSEC) 20 MG capsule, TAKE ONE CAPSULE DAILY, Disp: 90  capsule, Rfl: 0 .  ranitidine (ZANTAC) 150 MG tablet, TAKE 1 TABLET BY MOUTH TWO TIMES DAILY, Disp: 180 tablet, Rfl: 9 .  SYNTHROID 50 MCG tablet, TAKE 1 TABLET (50 MCG TOTAL) BY MOUTH DAILY BEFORE BREAKFAST., Disp: 90 tablet, Rfl: 2 .  UNABLE TO FIND, APPLY TO THE AFFECTED AREA(S) TWICE DAILY ON RIGHT CHEEK FOR 10 DAYS, Disp: , Rfl: 0 .  warfarin (COUMADIN) 1 MG tablet, Take 1 tablet (1 mg total) by mouth daily., Disp: 30 tablet, Rfl: 12 .  warfarin (COUMADIN) 3 MG tablet, Take 1 tablet (3 mg total) by mouth daily., Disp: 30 tablet, Rfl: 12  Review of Systems  Constitutional: Positive for activity change and fatigue.  HENT: Positive for congestion.   Eyes: Negative.   Respiratory: Negative for cough, chest tightness and shortness of breath.   Cardiovascular: Positive for leg swelling. Negative for chest pain and palpitations.  Endocrine: Negative for cold intolerance, heat intolerance, polydipsia, polyphagia and polyuria.  Musculoskeletal: Positive for arthralgias, back pain, gait problem, joint swelling and myalgias.  Skin: Positive for color change and wound.  Allergic/Immunologic: Positive for environmental allergies.  Neurological: Negative for dizziness, light-headedness and headaches.  Psychiatric/Behavioral: Negative for agitation, confusion, decreased concentration, self-injury and sleep disturbance. The patient is not nervous/anxious and is not hyperactive.     Social History   Tobacco Use  . Smoking status: Never Smoker  . Smokeless tobacco: Never Used  Substance Use Topics  . Alcohol use: No      Objective:   BP (!) 141/79 (BP Location: Left Arm, Patient Position: Sitting, Cuff Size: Large) Comment: electronic cuff  Pulse 80   Temp 98.9 F (37.2 C)   Resp 20   Wt 175 lb (79.4 kg)   SpO2 96%   BMI 27.41 kg/m  Vitals:   08/24/18 1054  BP: (!) 141/79  Pulse: 80  Resp: 20  Temp: 98.9 F (37.2 C)  SpO2: 96%  Weight: 175 lb (79.4 kg)     Physical  Exam Constitutional:      Appearance: She is well-developed.  HENT:     Head: Normocephalic and atraumatic.     Right Ear: External ear normal.     Left Ear: External ear normal.     Nose: Nose normal.  Eyes:     General: No scleral icterus.    Conjunctiva/sclera: Conjunctivae normal.  Neck:     Thyroid: No thyromegaly.  Cardiovascular:     Rate and Rhythm: Normal rate and regular rhythm.     Heart sounds: Normal heart sounds.  Pulmonary:  Effort: Pulmonary effort is normal.     Breath sounds: Normal breath sounds.  Abdominal:     Palpations: Abdomen is soft.  Musculoskeletal:        General: Swelling present.     Right lower leg: Edema present.     Left lower leg: Edema present.     Comments: Trace -1+ edema. Right foot swollen ,mildly tender,erythematous.  Lymphadenopathy:     Cervical: No cervical adenopathy.  Skin:    General: Skin is warm and dry.  Neurological:     General: No focal deficit present.     Mental Status: She is alert and oriented to person, place, and time.     Comments: Foot exam--pt cannot feel thick monofilament.  Psychiatric:        Mood and Affect: Mood normal.        Behavior: Behavior normal.        Thought Content: Thought content normal.        Judgment: Judgment normal.         Assessment & Plan    1. Type 2 diabetes mellitus with diabetic nephropathy, without long-term current use of insulin ([place])   2. Atrial fibrillation, unspecified type ([place])  - POCT INR--2.3 today.  3. Pre-ulcerative corn or callous  - Ambulatory referral to Podiatry  4. Cellulitis of right lower extremity/foot   - cephALEXin (KEFLEX) 500 MG capsule; Take 1 capsule (500 mg total) by mouth 2 (two) times daily.  Dispense: 20 capsule; Refill: 0  5. Gastrointestinal stromal tumor (GIST) of stomach ([place])   6. Arthritis No NSAIDs on coumadin.    I have done the exam and reviewed the above chart and it is accurate to the best of my knowledge. Risk manager has been used in this note in any air is in the dictation or transcription are unintentional.  Dawn Durie, MD  [place]

## 2018-09-01 ENCOUNTER — Other Ambulatory Visit: Payer: Self-pay | Admitting: Family Medicine

## 2018-09-20 ENCOUNTER — Other Ambulatory Visit: Payer: Self-pay | Admitting: Family Medicine

## 2018-09-20 ENCOUNTER — Ambulatory Visit: Payer: Medicare Other

## 2018-09-20 DIAGNOSIS — I4891 Unspecified atrial fibrillation: Secondary | ICD-10-CM

## 2018-09-20 DIAGNOSIS — R350 Frequency of micturition: Secondary | ICD-10-CM

## 2018-09-20 DIAGNOSIS — G629 Polyneuropathy, unspecified: Secondary | ICD-10-CM

## 2018-09-20 DIAGNOSIS — E1121 Type 2 diabetes mellitus with diabetic nephropathy: Secondary | ICD-10-CM

## 2018-09-20 LAB — POCT INR
INR: 2.4 (ref 2.0–3.0)
PT: 29.3

## 2018-09-20 NOTE — Patient Instructions (Signed)
Description   Take 4mg M,W,F and take 5mg all other days (Sun,Tue,Thur,Sat) Return in 4 weeks.     

## 2018-09-21 ENCOUNTER — Ambulatory Visit: Payer: Self-pay

## 2018-10-18 ENCOUNTER — Ambulatory Visit: Payer: Medicare Other

## 2018-10-18 DIAGNOSIS — I4891 Unspecified atrial fibrillation: Secondary | ICD-10-CM

## 2018-10-18 LAB — POCT INR
INR: 2.3 (ref 2.0–3.0)
PT: 27.3

## 2018-10-18 NOTE — Patient Instructions (Signed)
Description   Take 4mg M,W,F and take 5mg all other days (Sun,Tue,Thur,Sat) Return in 4 weeks.     

## 2018-10-24 ENCOUNTER — Ambulatory Visit: Payer: Medicare Other | Admitting: Podiatry

## 2018-10-26 ENCOUNTER — Ambulatory Visit: Payer: Self-pay | Admitting: Family Medicine

## 2018-11-07 ENCOUNTER — Other Ambulatory Visit: Payer: Self-pay | Admitting: Family Medicine

## 2018-11-07 DIAGNOSIS — E039 Hypothyroidism, unspecified: Secondary | ICD-10-CM

## 2018-11-14 ENCOUNTER — Telehealth: Payer: Self-pay

## 2018-11-14 NOTE — Telephone Encounter (Signed)
Patient has an appointment tomorrow for her PT-INR. Her children requested that she not enter the building. Can she have it checked in the car? Please call

## 2018-11-14 NOTE — Telephone Encounter (Signed)
INR will be checked from the car.  Pt advised.   Thanks,   -Mickel Baas

## 2018-11-15 ENCOUNTER — Other Ambulatory Visit: Payer: Self-pay

## 2018-11-15 ENCOUNTER — Ambulatory Visit (INDEPENDENT_AMBULATORY_CARE_PROVIDER_SITE_OTHER): Payer: Medicare Other

## 2018-11-15 DIAGNOSIS — I4891 Unspecified atrial fibrillation: Secondary | ICD-10-CM

## 2018-11-15 LAB — POCT INR: INR: 2.6 (ref 2.0–3.0)

## 2018-11-15 NOTE — Patient Instructions (Signed)
Description   Take 4mg  M,W,F and take 5mg  all other days (Sun,Tue,Thur,Sat) Return in 4 weeks.

## 2018-11-28 ENCOUNTER — Other Ambulatory Visit: Payer: Self-pay | Admitting: Family Medicine

## 2018-12-05 ENCOUNTER — Telehealth: Payer: Self-pay

## 2018-12-05 NOTE — Telephone Encounter (Signed)
Pt states she was seen in the office 08/05/2018 and 08/24/2018 for a foot infection.  She was prescribed Keflex.  Pt states the infection is back and would like a refill on the antibiotic if possible.  Pt uses [facility] in [place].    Contact number:  [phone]  Thanks,   -Mickel Baas

## 2018-12-05 NOTE — Telephone Encounter (Signed)
Please review. Thanks!  

## 2018-12-05 NOTE — Telephone Encounter (Signed)
Keflex 500mg  TID for 1 week.

## 2018-12-06 MED ORDER — CEPHALEXIN 500 MG PO CAPS: 500.0000 mg | ORAL_CAPSULE | Freq: Three times a day (TID) | ORAL | 0 refills | Status: DC

## 2018-12-06 NOTE — Telephone Encounter (Signed)
Rx sent to pharmacy   

## 2018-12-13 ENCOUNTER — Ambulatory Visit: Payer: Medicare Other

## 2018-12-14 ENCOUNTER — Ambulatory Visit (INDEPENDENT_AMBULATORY_CARE_PROVIDER_SITE_OTHER): Payer: Medicare Other

## 2018-12-14 ENCOUNTER — Other Ambulatory Visit: Payer: Self-pay

## 2018-12-14 DIAGNOSIS — I4891 Unspecified atrial fibrillation: Secondary | ICD-10-CM | POA: Diagnosis not present

## 2018-12-14 LAB — POCT INR
INR: 2.5 (ref 2.0–3.0)
Prothrombin Time: 30.3

## 2018-12-14 NOTE — Patient Instructions (Signed)
Pt has no changes with medication.  Continue normal dosing as before.  Return in 4 weeks on 01/11/2019 at 9:30am

## 2019-01-03 ENCOUNTER — Ambulatory Visit (INDEPENDENT_AMBULATORY_CARE_PROVIDER_SITE_OTHER): Payer: Medicare Other

## 2019-01-03 ENCOUNTER — Other Ambulatory Visit: Payer: Self-pay

## 2019-01-03 DIAGNOSIS — Z0000 Encounter for general adult medical examination without abnormal findings: Secondary | ICD-10-CM | POA: Diagnosis not present

## 2019-01-03 NOTE — Patient Instructions (Addendum)
[name] , Thank you for taking time to come for your Medicare Wellness Visit. I appreciate your ongoing commitment to your health goals. Please review the following plan we discussed and let me know if I can assist you in the future.   Screening recommendations/referrals: Colonoscopy: No longer required.  Mammogram: No longer required.  Bone Density: Pt declines order today.  Recommended yearly ophthalmology/optometry visit for glaucoma screening and checkup Recommended yearly dental visit for hygiene and checkup  Vaccinations: Influenza vaccine: N/A Pneumococcal vaccine: Completed series Tdap vaccine: Pt declines today.  Shingles vaccine: Pt declines today.     Advanced directives: Advance directive discussed with you today. Even though you declined this today please call our office should you change your mind and we can give you the proper paperwork for you to fill out.  Conditions/risks identified: Continue to increase water intake to 6-8 8 oz glasses a day.   Next appointment: PT/INR check on 01/11/19. Pt declined scheduling a CPE for this year or an AWV for next year, at this time.    Preventive Care 83 Years and Older, Female Preventive care refers to lifestyle choices and visits with your health care provider that can promote health and wellness. What does preventive care include?  A yearly physical exam. This is also called an annual well check.  Dental exams once or twice a year.  Routine eye exams. Ask your health care provider how often you should have your eyes checked.  Personal lifestyle choices, including:  Daily care of your teeth and gums.  Regular physical activity.  Eating a healthy diet.  Avoiding tobacco and drug use.  Limiting alcohol use.  Practicing safe sex.  Taking low-dose aspirin every day.  Taking vitamin and mineral supplements as recommended by your health care provider. What happens during an annual well check? The services and  screenings done by your health care provider during your annual well check will depend on your age, overall health, lifestyle risk factors, and family history of disease. Counseling  Your health care provider may ask you questions about your:  Alcohol use.  Tobacco use.  Drug use.  Emotional well-being.  Home and relationship well-being.  Sexual activity.  Eating habits.  History of falls.  Memory and ability to understand (cognition).  Work and work Statistician.  Reproductive health. Screening  You may have the following tests or measurements:  Height, weight, and BMI.  Blood pressure.  Lipid and cholesterol levels. These may be checked every 5 years, or more frequently if you are over 80 years old.  Skin check.  Lung cancer screening. You may have this screening every year starting at age 72 if you have a 30-pack-year history of smoking and currently smoke or have quit within the past 15 years.  Fecal occult blood test (FOBT) of the stool. You may have this test every year starting at age 56.  Flexible sigmoidoscopy or colonoscopy. You may have a sigmoidoscopy every 5 years or a colonoscopy every 10 years starting at age 83.  Hepatitis C blood test.  Hepatitis B blood test.  Sexually transmitted disease (STD) testing.  Diabetes screening. This is done by checking your blood sugar (glucose) after you have not eaten for a while (fasting). You may have this done every 1-3 years.  Bone density scan. This is done to screen for osteoporosis. You may have this done starting at age 53.  Mammogram. This may be done every 1-2 years. Talk to your health care provider  about how often you should have regular mammograms. Talk with your health care provider about your test results, treatment options, and if necessary, the need for more tests. Vaccines  Your health care provider may recommend certain vaccines, such as:  Influenza vaccine. This is recommended every year.   Tetanus, diphtheria, and acellular pertussis (Tdap, Td) vaccine. You may need a Td booster every 10 years.  Zoster vaccine. You may need this after age 25.  Pneumococcal 13-valent conjugate (PCV13) vaccine. One dose is recommended after age 35.  Pneumococcal polysaccharide (PPSV23) vaccine. One dose is recommended after age 61. Talk to your health care provider about which screenings and vaccines you need and how often you need them. This information is not intended to replace advice given to you by your health care provider. Make sure you discuss any questions you have with your health care provider. Document Released: 08/23/2015 Document Revised: 04/15/2016 Document Reviewed: 05/28/2015 Elsevier Interactive Patient Education  2017 [place] Prevention in the Home Falls can cause injuries. They can happen to people of all ages. There are many things you can do to make your home safe and to help prevent falls. What can I do on the outside of my home?  Regularly fix the edges of walkways and driveways and fix any cracks.  Remove anything that might make you trip as you walk through a door, such as a raised step or threshold.  Trim any bushes or trees on the path to your home.  Use bright outdoor lighting.  Clear any walking paths of anything that might make someone trip, such as rocks or tools.  Regularly check to see if handrails are loose or broken. Make sure that both sides of any steps have handrails.  Any raised decks and porches should have guardrails on the edges.  Have any leaves, snow, or ice cleared regularly.  Use sand or salt on walking paths during winter.  Clean up any spills in your garage right away. This includes oil or grease spills. What can I do in the bathroom?  Use night lights.  Install grab bars by the toilet and in the tub and shower. Do not use towel bars as grab bars.  Use non-skid mats or decals in the tub or shower.  If you need to sit  down in the shower, use a plastic, non-slip stool.  Keep the floor dry. Clean up any water that spills on the floor as soon as it happens.  Remove soap buildup in the tub or shower regularly.  Attach bath mats securely with double-sided non-slip rug tape.  Do not have throw rugs and other things on the floor that can make you trip. What can I do in the bedroom?  Use night lights.  Make sure that you have a light by your bed that is easy to reach.  Do not use any sheets or blankets that are too big for your bed. They should not hang down onto the floor.  Have a firm chair that has side arms. You can use this for support while you get dressed.  Do not have throw rugs and other things on the floor that can make you trip. What can I do in the kitchen?  Clean up any spills right away.  Avoid walking on wet floors.  Keep items that you use a lot in easy-to-reach places.  If you need to reach something above you, use a strong step stool that has a grab bar.  Keep electrical cords out of the way.  Do not use floor polish or wax that makes floors slippery. If you must use wax, use non-skid floor wax.  Do not have throw rugs and other things on the floor that can make you trip. What can I do with my stairs?  Do not leave any items on the stairs.  Make sure that there are handrails on both sides of the stairs and use them. Fix handrails that are broken or loose. Make sure that handrails are as long as the stairways.  Check any carpeting to make sure that it is firmly attached to the stairs. Fix any carpet that is loose or worn.  Avoid having throw rugs at the top or bottom of the stairs. If you do have throw rugs, attach them to the floor with carpet tape.  Make sure that you have a light switch at the top of the stairs and the bottom of the stairs. If you do not have them, ask someone to add them for you. What else can I do to help prevent falls?  Wear shoes that:  Do not have  high heels.  Have rubber bottoms.  Are comfortable and fit you well.  Are closed at the toe. Do not wear sandals.  If you use a stepladder:  Make sure that it is fully opened. Do not climb a closed stepladder.  Make sure that both sides of the stepladder are locked into place.  Ask someone to hold it for you, if possible.  Clearly mark and make sure that you can see:  Any grab bars or handrails.  First and last steps.  Where the edge of each step is.  Use tools that help you move around (mobility aids) if they are needed. These include:  Canes.  Walkers.  Scooters.  Crutches.  Turn on the lights when you go into a dark area. Replace any light bulbs as soon as they burn out.  Set up your furniture so you have a clear path. Avoid moving your furniture around.  If any of your floors are uneven, fix them.  If there are any pets around you, be aware of where they are.  Review your medicines with your doctor. Some medicines can make you feel dizzy. This can increase your chance of falling. Ask your doctor what other things that you can do to help prevent falls. This information is not intended to replace advice given to you by your health care provider. Make sure you discuss any questions you have with your health care provider. Document Released: 05/23/2009 Document Revised: 01/02/2016 Document Reviewed: 08/31/2014 Elsevier Interactive Patient Education  2017 [employer].

## 2019-01-03 NOTE — Progress Notes (Signed)
Subjective:   Dawn Burnett is a 85 y.o. female who presents for Medicare Annual (Subsequent) preventive examination.    This visit is being conducted through telemedicine due to the COVID-19 pandemic. This patient has given me verbal consent via doximity to conduct this visit, patient states they are participating from their home address. Some vital signs may be absent or patient reported.    Patient identification: identified by name, DOB, and current address  Review of Systems:  N/A  Cardiac Risk Factors include: advanced age (>58men, >60 women);diabetes mellitus;dyslipidemia;hypertension     Objective:     Vitals: There were no vitals taken for this visit.  There is no height or weight on file to calculate BMI. Unable to obtain vitals due to visit being conducted via telephonically.   Advanced Directives 01/03/2019 07/29/2018 12/29/2017 07/29/2017 03/31/2017 12/17/2016 08/03/2016  Does Patient Have a Medical Advance Directive? No No No Yes No No No  Would patient like information on creating a medical advance directive? No - Patient declined No - Patient declined No - Patient declined - No - Patient declined No - Patient declined -    Tobacco Social History   Tobacco Use  Smoking Status Never Smoker  Smokeless Tobacco Never Used     Counseling given: Not Answered   Clinical Intake:  Pre-visit preparation completed: Yes  Pain : 0-10 Pain Score: 9  Pain Type: Chronic pain, Neuropathic pain Pain Location: Foot Pain Orientation: Right, Left Pain Descriptors / Indicators: Aching, Burning, Tingling Pain Frequency: Constant     Nutritional Status: BMI 25 -29 Overweight Nutritional Risks: Non-healing wound(Currently has an infection on the right foot but it is seeing a Podiatrist for this. ) Diabetes: Yes  How often do you need to have someone help you when you read instructions, pamphlets, or other written materials from your doctor or pharmacy?: 1 - Never    Diabetes:  Is the patient diabetic?  Yes type 2 If diabetic, was a CBG obtained today?  No  Did the patient bring in their glucometer from home?  No  How often do you monitor your CBG's? Does not.   Financial Strains and Diabetes Management:  Are you having any financial strains with the device, your supplies or your medication? No .  Does the patient want to be seen by Chronic Care Management for management of their diabetes?  No  Would the patient like to be referred to a Nutritionist or for Diabetic Management?  No   Diabetic Exams:  Diabetic Eye Exam: Completed 06/2018 per pt. Requested records to be faxed to clinic.   Diabetic Foot Exam: Completed 10/18/18.    Interpreter Needed?: No  Information entered by :: [facility], LPN  Past Medical History:  Diagnosis Date  . Arthritis   . Atrial fibrillation ([place])   . CHF (congestive heart failure) ([place])   . GERD (gastroesophageal reflux disease)   . Hiatal hernia   . Hypertension   . Skin cancer, basal cell   . Thyroid disease    Past Surgical History:  Procedure Laterality Date  . ABDOMINAL HYSTERECTOMY    . APPENDECTOMY    . BREAST SURGERY    . cateract extraction    . EYE SURGERY     done by Dr. Vickki Muff  . KNEE SURGERY    . 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 right hip surgery    . right total knee replacement     Family History  Problem Relation Age of Onset  . Heart disease Mother   . Hypertension Mother   . Heart attack Mother   . Stomach cancer Father   . Liver cancer Brother   . Hypertension Son   . Huntington's disease Sister   . Diabetes Sister   . Kidney disease Sister   . Congestive Heart Failure Sister   . Pneumonia Sister   . Stroke Brother   . Parkinson's disease Brother   . Hypertension Brother   . Congestive Heart Failure Brother    Social History   Socioeconomic History  . Marital status: Widowed    Spouse name: widowed  . Number of children: 2  .  Years of education: 47  . Highest education level: 12th grade  Occupational History  . Occupation: retired  Scientific laboratory technician  . Financial resource strain: Not hard at all  . Food insecurity:    Worry: Never true    Inability: Never true  . Transportation needs:    Medical: No    Non-medical: No  Tobacco Use  . Smoking status: Never Smoker  . Smokeless tobacco: Never Used  Substance and Sexual Activity  . Alcohol use: No  . Drug use: No  . Sexual activity: Never  Lifestyle  . Physical activity:    Days per week: 0 days    Minutes per session: 0 min  . Stress: Not at all  Relationships  . Social connections:    Talks on phone: Patient refused    Gets together: Patient refused    Attends religious service: Patient refused    Active member of club or organization: Patient refused    Attends meetings of clubs or organizations: Patient refused    Relationship status: Patient refused  Other Topics Concern  . Not on file  Social History Narrative  . Not on file    Outpatient Encounter Medications as of 01/03/2019  Medication Sig  . acetaminophen (TYLENOL) 500 MG tablet Take 500 mg by mouth every 6 (six) hours as needed for mild pain, moderate pain, fever or headache.  Marland Kitchen atorvastatin (LIPITOR) 20 MG tablet Take 20 mg by mouth at bedtime.   . Calcium-Vitamin D 600-200 MG-UNIT per tablet Take 600 tablets by mouth daily with supper.   . celecoxib (CELEBREX) 200 MG capsule TAKE ONE CAPSULE BY MOUTH DAILY  . COUMADIN 1 MG tablet TAKE 1 (ONE) TABLET TABLET, ORAL, DAILY AS DIRECTED  . COUMADIN 4 MG tablet TAKE 1 TABLET AS DIRECTED  . diltiazem (CARDIZEM CD) 120 MG 24 hr capsule Take 120 mg by mouth daily.  Marland Kitchen ENTRESTO 97-103 MG Take 1 tablet by mouth 2 (two) times daily.   . furosemide (LASIX) 20 MG tablet Take 20 mg by mouth daily.   Marland Kitchen gabapentin (NEURONTIN) 600 MG tablet TAKE ONE TABLET BY MOUTH THREE TIMES A DAY  . imipramine (TOFRANIL) 25 MG tablet TAKE ONE TABLET BY MOUTH EVERY NIGHT  AT BEDTIME  . loperamide (IMODIUM) 2 MG capsule Take 2 mg by mouth as needed.   . Methylcellulose, Laxative, (CITRUCEL) 500 MG TABS Take 2 tablets by mouth daily.  . metoprolol (LOPRESSOR) 100 MG tablet Take 100 mg by mouth 2 (two) times daily.   . mirabegron ER (MYRBETRIQ) 50 MG TB24 tablet Take 50 mg by mouth daily.   . Multiple Vitamins-Minerals (CENTRUM SILVER ULTRA WOMENS PO) Take 1 tablet by mouth daily with lunch.   . nystatin (MYCOSTATIN/NYSTOP) powder Apply topically 4 (four) times daily.  Marland Kitchen omeprazole (PRILOSEC)  20 MG capsule TAKE ONE CAPSULE DAILY  . Probiotic Product (ALIGN) 4 MG CAPS Take by mouth every other day.  Marland Kitchen SYNTHROID 50 MCG tablet TAKE 1 TABLET (50 MCG TOTAL) BY MOUTH DAILY BEFORE BREAKFAST.  . cephALEXin (KEFLEX) 500 MG capsule Take 1 capsule (500 mg total) by mouth 2 (two) times daily. (Patient not taking: Reported on 01/03/2019)  . cephALEXin (KEFLEX) 500 MG capsule Take 1 capsule (500 mg total) by mouth 3 (three) times daily. (Patient not taking: Reported on 01/03/2019)  . diphenoxylate-atropine (LOMOTIL) 2.5-0.025 MG tablet Take 1 tablet by mouth daily as needed for diarrhea or loose stools. (Patient not taking: Reported on 01/03/2019)  . mupirocin cream (BACTROBAN) 2 % Apply 1 application topically daily. (Patient not taking: Reported on 01/03/2019)  . nystatin cream (MYCOSTATIN) APPLY  TOPICALLY 2 (TWO) TIMES DAILY. APPLY TO AFFECTED AREA (Patient not taking: Reported on 01/03/2019)  . ranitidine (ZANTAC) 150 MG tablet TAKE 1 TABLET BY MOUTH TWO TIMES DAILY (Patient not taking: Reported on 01/03/2019)  . UNABLE TO FIND APPLY TO THE AFFECTED AREA(S) TWICE DAILY ON RIGHT CHEEK FOR 10 DAYS  . warfarin (COUMADIN) 1 MG tablet Take 1 tablet (1 mg total) by mouth daily. (Patient not taking: Reported on 01/03/2019)  . warfarin (COUMADIN) 3 MG tablet Take 1 tablet (3 mg total) by mouth daily. (Patient not taking: Reported on 01/03/2019)   No facility-administered encounter  medications on file as of 01/03/2019.     Activities of Daily Living In your present state of health, do you have any difficulty performing the following activities: 01/03/2019  Hearing? Y  Comment Does not wear hearing aids.   Vision? N  Difficulty concentrating or making decisions? N  Walking or climbing stairs? Y  Comment Due to balance issues and foot pain.   Dressing or bathing? N  Doing errands, shopping? Y  Comment Does not drive.   Preparing Food and eating ? N  Using the Toilet? N  In the past six months, have you accidently leaked urine? Y  Comment Wears protection at all times.   Do you have problems with loss of bowel control? N  Managing your Medications? N  Managing your Finances? N  Housekeeping or managing your Housekeeping? Y  Comment Has someone else clean the home.   Some recent data might be hidden    Patient Care Team: Jerrol Banana., MD as PCP - General (Family Medicine) Oneta Rack, MD as Consulting Physician (Dermatology) Margaretha Sheffield, MD as Consulting Physician (Otolaryngology) Dionisio David, MD as Consulting Physician (Cardiology) Samara Deist, DPM as Referring Physician (Podiatry)    Assessment:   This is a routine wellness examination for Dawn Burnett.  Exercise Activities and Dietary recommendations Current Exercise Habits: The patient does not participate in regular exercise at present, Exercise limited by: neurologic condition(s)  Goals    . DIET - INCREASE WATER INTAKE     Recommend increasing water intake to 4-6 glasses a day.     . Increase water intake     Recommend drinking 3-4 glasses of water a day.        Fall Risk: Fall Risk  01/03/2019 12/29/2017 12/17/2016 12/24/2015 11/21/2015  Falls in the past year? 0 No Yes Yes Yes  Number falls in past yr: - - 2 or more 1 1  Injury with Fall? - - No Yes No  Risk Factor Category  - - High Fall Risk - -  Risk for fall due to : - - - - -  Follow up - - Falls prevention discussed  Falls evaluation completed -    FALL RISK PREVENTION PERTAINING TO THE HOME:  Any stairs in or around the home? Yes  If so, are there any without handrails? Yes   Home free of loose throw rugs in walkways, pet beds, electrical cords, etc? Yes  Adequate lighting in your home to reduce risk of falls? Yes   ASSISTIVE DEVICES UTILIZED TO PREVENT FALLS:  Life alert? Yes  Use of a cane, walker or w/c? Yes  Grab bars in the bathroom? Yes  Shower chair or bench in shower? Yes  Elevated toilet seat or a handicapped toilet? Yes   TIMED UP AND GO:  Was the test performed? No .    Depression Screen PHQ 2/9 Scores 01/03/2019 12/29/2017 12/29/2017 12/17/2016  PHQ - 2 Score 0 0 0 0  PHQ- 9 Score - 2 - 2     Cognitive Function: Declined today.      6CIT Screen 12/29/2017 12/17/2016  What Year? 0 points 0 points  What month? 0 points 0 points  What time? 0 points 0 points  Count back from 20 0 points 0 points  Months in reverse 0 points 0 points  Repeat phrase 2 points 2 points  Total Score 2 2    Immunization History  Administered Date(s) Administered  . Influenza, High Dose Seasonal PF 04/28/2016, 04/30/2017  . Influenza,inj,Quad PF,6+ Mos 05/22/2015  . Pneumococcal Conjugate-13 07/10/2015  . Pneumococcal Polysaccharide-23 12/17/2016  . Td 06/20/2004    Qualifies for Shingles Vaccine? Yes . Due for Shingrix. Education has been provided regarding the importance of this vaccine. Pt has been advised to call insurance company to determine out of pocket expense. Advised may also receive vaccine at local pharmacy or Health Dept. Verbalized acceptance and understanding.  Tdap: Although this vaccine is not a covered service during a Wellness Exam, does the patient still wish to receive this vaccine today?  No .  Advised may receive this vaccine at local pharmacy or Health Dept. Aware to provide a copy of the vaccination record if obtained from local pharmacy or Health Dept. Verbalized  acceptance and understanding.  Pneumococcal Vaccine: Up to date  Screening Tests Health Maintenance  Topic Date Due  . TETANUS/TDAP  06/20/2014  . URINE MICROALBUMIN  01/21/2016  . DEXA SCAN  03/27/2018  . HEMOGLOBIN A1C  01/19/2019  . INFLUENZA VACCINE  03/11/2019  . OPHTHALMOLOGY EXAM  06/11/2019  . FOOT EXAM  10/18/2019  . PNA vac Low Risk Adult  Completed    Cancer Screenings:  Colorectal Screening: No longer required.   Mammogram: No longer required.   Bone Density: Completed 03/27/13. Results reflect OSTEOPENIA. Repeat every 5 years. Pt declined referral today.   Lung Cancer Screening: (Low Dose CT Chest recommended if Age 54-80 years, 30 pack-year currently smoking OR have quit w/in 15years.) does not qualify.   Additional Screening:  Dental Screening: Recommended annual dental exams for proper oral hygiene   Community Resource Referral:  CRR required this visit?  No       Plan:  I have personally reviewed and addressed the Medicare Annual Wellness questionnaire and have noted the following in the patient's chart:  A. Medical and social history B. Use of alcohol, tobacco or illicit drugs  C. Current medications and supplements D. Functional ability and status E.  Nutritional status F.  Physical activity G. Advance directives H. List of other physicians I.  Hospitalizations, surgeries, and ER visits  in previous 12 months J.  [place] such as hearing and vision if needed, cognitive and depression L. Referrals and appointments   In addition, I have reviewed and discussed with patient certain preventive protocols, quality metrics, and best practice recommendations. A written personalized care plan for preventive services as well as general preventive health recommendations were provided to patient.   Glendora Score, [place]  4/00/8676 Nurse Health Advisor   Nurse Notes: Pt needs a urine check at next in office today. Pt declined a  DEXA referral or a future order for a tetanus vaccine.

## 2019-01-11 ENCOUNTER — Other Ambulatory Visit: Payer: Self-pay

## 2019-01-11 ENCOUNTER — Ambulatory Visit (INDEPENDENT_AMBULATORY_CARE_PROVIDER_SITE_OTHER): Payer: Medicare Other | Admitting: *Deleted

## 2019-01-11 DIAGNOSIS — I4891 Unspecified atrial fibrillation: Secondary | ICD-10-CM | POA: Diagnosis not present

## 2019-01-11 LAB — POCT INR
INR: 2.2 (ref 2.0–3.0)
PT: 26

## 2019-01-11 NOTE — Patient Instructions (Signed)
Pt takes 4mg  M,W,F and take 5mg  all other days (Sun,Tue,Thur,Sat) Return in 4 weeks on 02/08/2019.  No changes

## 2019-01-26 ENCOUNTER — Other Ambulatory Visit: Payer: Self-pay | Admitting: Family Medicine

## 2019-01-26 MED ORDER — MIRABEGRON ER 50 MG PO TB24: 50.0000 mg | ORAL_TABLET | Freq: Every day | ORAL | 11 refills | Status: DC

## 2019-01-26 NOTE — Telephone Encounter (Signed)
Ok to refill this medication? 

## 2019-01-26 NOTE — Telephone Encounter (Signed)
pt needs a refill on  Mirabegron ER 50 mg  This is a rx that Dr, Jacqlyn Larsen use to prescribe  Kristopher Oppenheim  Thanks terii

## 2019-02-08 ENCOUNTER — Ambulatory Visit (INDEPENDENT_AMBULATORY_CARE_PROVIDER_SITE_OTHER): Payer: Medicare Other

## 2019-02-08 DIAGNOSIS — I4891 Unspecified atrial fibrillation: Secondary | ICD-10-CM | POA: Diagnosis not present

## 2019-02-08 LAB — POCT INR
INR: 2.1 (ref 2.0–3.0)
PT: 25.8

## 2019-02-20 ENCOUNTER — Ambulatory Visit: Payer: Medicare Other | Admitting: Podiatry

## 2019-02-20 ENCOUNTER — Encounter: Payer: Self-pay | Admitting: Podiatry

## 2019-02-20 ENCOUNTER — Other Ambulatory Visit: Payer: Self-pay

## 2019-02-20 VITALS — Temp 97.5°F

## 2019-02-20 DIAGNOSIS — E1121 Type 2 diabetes mellitus with diabetic nephropathy: Secondary | ICD-10-CM | POA: Diagnosis not present

## 2019-02-20 DIAGNOSIS — B351 Tinea unguium: Secondary | ICD-10-CM

## 2019-02-20 DIAGNOSIS — E1142 Type 2 diabetes mellitus with diabetic polyneuropathy: Secondary | ICD-10-CM | POA: Diagnosis not present

## 2019-02-20 DIAGNOSIS — M79676 Pain in unspecified toe(s): Secondary | ICD-10-CM | POA: Diagnosis not present

## 2019-02-20 NOTE — Progress Notes (Signed)
Complaint:  Visit Type: Patient returns to my office for continued preventative foot care services. Complaint: Patient states" my nails have grown long and thick and become painful to walk and wear shoes" Patient has been diagnosed with hyperglycemia with neuropathy.. The patient presents for preventative foot care services. No changes to ROS.  Patient is taking coumadin. Patient says she is having neuropathy pain.  Podiatric Exam: Vascular: dorsalis pedis and posterior tibial pulses are palpable bilateral. Capillary return is immediate. Temperature gradient is WNL. Skin turgor WNL   Sensorium: Normal Semmes Weinstein monofilament test. Normal tactile sensation bilaterally. Nail Exam: Pt has thick disfigured discolored nails with subungual debris noted bilateral entire nail hallux through fifth toenails Ulcer Exam: There is no evidence of ulcer or pre-ulcerative changes or infection. Orthopedic Exam: Muscle tone and strength are WNL. No limitations in general ROM. No crepitus or effusions noted. Foot type and digits show no abnormalities.  HAV  B/L Skin: No Porokeratosis. No infection or ulcers  Diagnosis:  Onychomycosis, , Pain in right toe, pain in left toes  Treatment & Plan Procedures and Treatment: Consent by patient was obtained for treatment procedures. The patient understood the discussion of treatment and procedures well. All questions were answered thoroughly reviewed. Debridement of mycotic and hypertrophic toenails, 1 through 5 bilateral and clearing of subungual debris. No ulceration, no infection noted.   Return Visit-Office Procedure: Patient instructed to return to the office for a follow up visit 3 months for continued evaluation and treatment.    Gardiner Barefoot DPM

## 2019-02-21 ENCOUNTER — Other Ambulatory Visit: Payer: Self-pay | Admitting: Family Medicine

## 2019-02-21 DIAGNOSIS — R350 Frequency of micturition: Secondary | ICD-10-CM

## 2019-02-21 DIAGNOSIS — E1121 Type 2 diabetes mellitus with diabetic nephropathy: Secondary | ICD-10-CM

## 2019-02-21 DIAGNOSIS — G629 Polyneuropathy, unspecified: Secondary | ICD-10-CM

## 2019-02-27 ENCOUNTER — Other Ambulatory Visit: Payer: Self-pay | Admitting: Family Medicine

## 2019-02-27 ENCOUNTER — Telehealth: Payer: Self-pay | Admitting: Family Medicine

## 2019-02-27 NOTE — Telephone Encounter (Signed)
Pt needs a refill on celebrex  Generic 200mg   Kristopher Oppenheim  teri

## 2019-02-28 NOTE — Telephone Encounter (Signed)
Pharmacy requesting refills. Thanks!  

## 2019-03-08 ENCOUNTER — Ambulatory Visit: Payer: Self-pay

## 2019-04-10 ENCOUNTER — Other Ambulatory Visit: Payer: Self-pay | Admitting: Family Medicine

## 2019-04-19 ENCOUNTER — Other Ambulatory Visit: Payer: Self-pay | Admitting: Family Medicine

## 2019-04-19 NOTE — Telephone Encounter (Signed)
Pharmacy requesting refills. Thanks!  

## 2019-05-01 ENCOUNTER — Other Ambulatory Visit: Payer: Self-pay | Admitting: Family Medicine

## 2019-05-02 ENCOUNTER — Other Ambulatory Visit: Payer: Self-pay | Admitting: Family Medicine

## 2019-05-02 NOTE — Telephone Encounter (Signed)
Pt needs a refill  Celebrex 100mg   Catha Gosselin

## 2019-05-03 ENCOUNTER — Other Ambulatory Visit: Payer: Self-pay | Admitting: Family Medicine

## 2019-05-03 DIAGNOSIS — E039 Hypothyroidism, unspecified: Secondary | ICD-10-CM

## 2019-05-03 NOTE — Telephone Encounter (Signed)
Pharmacy requesting refills. Thanks!  

## 2019-05-04 ENCOUNTER — Other Ambulatory Visit: Payer: Self-pay | Admitting: Family Medicine

## 2019-05-04 NOTE — Telephone Encounter (Signed)
Pt needing a refill on:  celecoxib (CELEBREX) 100 MG capsule - Pt took last one this morning.  Please fill at:  [facility], [place] [phone] (Phone) [phone] (Fax)   Thanks, [place]

## 2019-05-10 ENCOUNTER — Encounter: Payer: Self-pay | Admitting: Family Medicine

## 2019-05-10 ENCOUNTER — Ambulatory Visit (INDEPENDENT_AMBULATORY_CARE_PROVIDER_SITE_OTHER): Payer: Medicare Other | Admitting: Family Medicine

## 2019-05-10 ENCOUNTER — Other Ambulatory Visit: Payer: Self-pay

## 2019-05-10 VITALS — BP 124/74 | HR 71 | Temp 98.2°F | Resp 16 | Wt 176.0 lb

## 2019-05-10 DIAGNOSIS — Z23 Encounter for immunization: Secondary | ICD-10-CM | POA: Diagnosis not present

## 2019-05-10 DIAGNOSIS — E1121 Type 2 diabetes mellitus with diabetic nephropathy: Secondary | ICD-10-CM

## 2019-05-10 DIAGNOSIS — I10 Essential (primary) hypertension: Secondary | ICD-10-CM

## 2019-05-10 DIAGNOSIS — I4891 Unspecified atrial fibrillation: Secondary | ICD-10-CM | POA: Diagnosis not present

## 2019-05-10 DIAGNOSIS — M170 Bilateral primary osteoarthritis of knee: Secondary | ICD-10-CM

## 2019-05-10 DIAGNOSIS — H6121 Impacted cerumen, right ear: Secondary | ICD-10-CM

## 2019-05-10 LAB — POCT GLYCOSYLATED HEMOGLOBIN (HGB A1C)
Est. average glucose Bld gHb Est-mCnc: 180
Hemoglobin A1C: 7.9 % — AB (ref 4.0–5.6)

## 2019-05-10 NOTE — Patient Instructions (Addendum)
1. Type 2 diabetes mellitus with diabetic polyneuropathy, without long-term current use of  -poct Hb A1c 7.9  2. Neuropathy -stopped celecoxib (CELEBREX) 100 MG capsule. Advised topical [employer] or [employer]. Also can try OTC Turmeric daily.  3. Ear Irrigation -right ear.   -influenza vaccination    Follow-up 4 months.

## 2019-05-10 NOTE — Progress Notes (Signed)
Patient: Dawn Burnett Female    DOB: [birth year]   85 y.o.   MRN: [id number] Visit Date: 05/10/2019  Today's Provider: [name], MD   Chief Complaint  Patient presents with  . Medication Management   Subjective:     HPI   Patient is here concerning refills for Celebrex. I have tried to explain to the patient before that this combination with the anticoagulants is bad combination at 85 years old. Patient is also having intermittent right ear pain.  Decreased hearing in this ear also. She was doing well with her heart failure also.  She is taking Entresto. Allergies  Allergen Reactions  . Iodinated Diagnostic Agents Rash  . Augmentin [Amoxicillin-Pot Clavulanate] Other (See Comments)    Gi distress   . Codeine   . Crestor [Rosuvastatin] Other (See Comments)    Unknown Unknown  . Levaquin [Levofloxacin In D5w]     Stomach pain, body pain/ache all over Stomach pain, body pain/ache all over  . Naproxen Other (See Comments)    unknown unknown  . Prednisone   . Sulfa Antibiotics   . Sulfacetamide Sodium   . Tolectin [Tolmetin]      Current Outpatient Medications:  .  acetaminophen (TYLENOL) 500 MG tablet, Take 500 mg by mouth every 6 (six) hours as needed for mild pain, moderate pain, fever or headache., Disp: , Rfl:  .  atorvastatin (LIPITOR) 20 MG tablet, Take 20 mg by mouth at bedtime. , Disp: , Rfl:  .  Calcium-Vitamin D 600-200 MG-UNIT per tablet, Take 600 tablets by mouth daily with supper. , Disp: , Rfl:  .  celecoxib (CELEBREX) 100 MG capsule, Take 2 capsules (200 mg total) by mouth daily., Disp: 90 capsule, Rfl: 0 .  digoxin (LANOXIN) 0.125 MG tablet, , Disp: , Rfl:  .  ELIQUIS 2.5 MG TABS tablet, , Disp: , Rfl:  .  ENTRESTO 97-103 MG, Take 1 tablet by mouth 2 (two) times daily. , Disp: , Rfl:  .  furosemide (LASIX) 20 MG tablet, Take 20 mg by mouth daily. , Disp: , Rfl:  .  gabapentin (NEURONTIN) 600 MG tablet, TAKE ONE TABLET BY MOUTH THREE  TIMES A DAY, Disp: 90 tablet, Rfl: 9 .  imipramine (TOFRANIL) 25 MG tablet, TAKE ONE TABLET BY MOUTH EVERY NIGHT AT BEDTIME, Disp: 30 tablet, Rfl: 3 .  loperamide (IMODIUM) 2 MG capsule, Take 2 mg by mouth as needed. , Disp: , Rfl:  .  Methylcellulose, Laxative, (CITRUCEL) 500 MG TABS, Take 2 tablets by mouth daily., Disp: , Rfl:  .  metoprolol (LOPRESSOR) 100 MG tablet, Take 100 mg by mouth 2 (two) times daily. , Disp: , Rfl:  .  mirabegron ER (MYRBETRIQ) 50 MG TB24 tablet, Take 1 tablet (50 mg total) by mouth daily., Disp: 30 tablet, Rfl: 11 .  Multiple Vitamins-Minerals (CENTRUM SILVER ULTRA WOMENS PO), Take 1 tablet by mouth daily with lunch. , Disp: , Rfl:  .  NYAMYC powder, APPLY TOPICALLY FOUR TIMES A DAY, Disp: 15 g, Rfl: 0 .  omeprazole (PRILOSEC) 20 MG capsule, TAKE ONE CAPSULE DAILY, Disp: 90 capsule, Rfl: 3 .  Probiotic Product (ALIGN) 4 MG CAPS, Take by mouth every other day., Disp: , Rfl:  .  SYNTHROID 50 MCG tablet, TAKE ONE TABLET BY MOUTH DAILY BEFORE BREAKFAST, Disp: 90 tablet, Rfl: 3 .  cephALEXin (KEFLEX) 500 MG capsule, Take 1 capsule (500 mg total) by mouth 2 (two) times daily. (Patient not  taking: Reported on 01/03/2019), Disp: 20 capsule, Rfl: 0 .  cephALEXin (KEFLEX) 500 MG capsule, Take 1 capsule (500 mg total) by mouth 3 (three) times daily. (Patient not taking: Reported on 01/03/2019), Disp: 21 capsule, Rfl: 0 .  COUMADIN 1 MG tablet, TAKE 1 (ONE) TABLET TABLET, ORAL, DAILY AS DIRECTED (Patient not taking: Reported on 05/10/2019), Disp: 30 tablet, Rfl: 11 .  COUMADIN 4 MG tablet, TAKE 1 TABLET AS DIRECTED (Patient not taking: Reported on 05/10/2019), Disp: 30 tablet, Rfl: 9 .  diltiazem (CARDIZEM CD) 120 MG 24 hr capsule, Take 120 mg by mouth daily., Disp: , Rfl:  .  diphenoxylate-atropine (LOMOTIL) 2.5-0.025 MG tablet, Take 1 tablet by mouth daily as needed for diarrhea or loose stools. (Patient not taking: Reported on 01/03/2019), Disp: 14 tablet, Rfl: 0 .  mupirocin cream  (BACTROBAN) 2 %, Apply 1 application topically daily. (Patient not taking: Reported on 01/03/2019), Disp: 15 g, Rfl: 0 .  nystatin cream (MYCOSTATIN), APPLY  TOPICALLY 2 (TWO) TIMES DAILY. APPLY TO AFFECTED AREA (Patient not taking: Reported on 01/03/2019), Disp: 30 g, Rfl: 1 .  ranitidine (ZANTAC) 150 MG tablet, TAKE 1 TABLET BY MOUTH TWO TIMES DAILY (Patient not taking: Reported on 01/03/2019), Disp: 180 tablet, Rfl: 9 .  UNABLE TO FIND, APPLY TO THE AFFECTED AREA(S) TWICE DAILY ON RIGHT CHEEK FOR 10 DAYS, Disp: , Rfl: 0 .  warfarin (COUMADIN) 1 MG tablet, Take 1 tablet (1 mg total) by mouth daily. (Patient not taking: Reported on 01/03/2019), Disp: 30 tablet, Rfl: 12 .  warfarin (COUMADIN) 3 MG tablet, Take 1 tablet (3 mg total) by mouth daily. (Patient not taking: Reported on 01/03/2019), Disp: 30 tablet, Rfl: 12  Review of Systems  Constitutional: Negative for appetite change, chills, fatigue and fever.  HENT: Positive for ear pain.   Eyes: Negative.   Respiratory: Negative for chest tightness and shortness of breath.   Cardiovascular: Negative for chest pain and palpitations.  Gastrointestinal: Negative for abdominal pain, nausea and vomiting.  Endocrine: Negative.   Musculoskeletal: Positive for arthralgias.  Allergic/Immunologic: Negative.   Neurological: Negative for dizziness and weakness.  Psychiatric/Behavioral: Negative.     Social History   Tobacco Use  . Smoking status: Never Smoker  . Smokeless tobacco: Never Used  Substance Use Topics  . Alcohol use: No      Objective:   BP 124/74 (BP Location: Right Arm, Patient Position: Sitting, Cuff Size: Large)   Pulse 71   Temp 98.2 F (36.8 C) (Other (Comment))   Resp 16   Wt 176 lb (79.8 kg)   SpO2 96%   BMI 27.57 kg/m  Vitals:   05/10/19 1025  BP: 124/74  Pulse: 71  Resp: 16  Temp: 98.2 F (36.8 C)  TempSrc: Other (Comment)  SpO2: 96%  Weight: 176 lb (79.8 kg)  Body mass index is 27.57 kg/m.   Physical  Exam Vitals signs reviewed.  Constitutional:      Appearance: She is well-developed.  HENT:     Head: Normocephalic and atraumatic.     Right Ear: External ear normal.     Left Ear: Ear canal and external ear normal.     Ears:     Comments: Right EAC blocked with cerumen.    Nose: Nose normal.  Eyes:     General: No scleral icterus.    Conjunctiva/sclera: Conjunctivae normal.  Neck:     Thyroid: No thyromegaly.  Cardiovascular:     Rate and Rhythm: Normal rate and  regular rhythm.     Heart sounds: Normal heart sounds.  Pulmonary:     Effort: Pulmonary effort is normal.     Breath sounds: Normal breath sounds.  Abdominal:     Palpations: Abdomen is soft.  Lymphadenopathy:     Cervical: No cervical adenopathy.  Skin:    General: Skin is warm and dry.  Neurological:     General: No focal deficit present.     Mental Status: She is alert and oriented to person, place, and time.     Comments: Foot exam--pt cannot feel thick monofilament.  Psychiatric:        Mood and Affect: Mood normal.        Behavior: Behavior normal.        Thought Content: Thought content normal.        Judgment: Judgment normal.      No results found for any visits on 05/10/19.     Assessment & Plan    1. Atrial fibrillation, unspecified type ([place]) On Coumadin.  Followed by cardiology.  2. Type 2 diabetes mellitus with diabetic nephropathy, without long-term current use of insulin ([place]) Fair control with A1c of 7.9.  At 46 this is probably as good as we can get. - POCT glycosylated hemoglobin (Hb A1C)--7.9  3. Essential (primary) hypertension Controlled on metoprolol  4. Need for influenza vaccination  - Flu Vaccine QUAD High Dose(Fluad)  5. Cerumen debris on tympanic membrane of right ear Improved after irrigation  6. Primary osteoarthritis of both knees Discussed with patient that topicals were safe.  Will not continue Celebrex as any nonsteroidal with Coumadin could be  life-threatening for a GI bleed.More than 50% 25 minute visit spent in counseling or coordination of care  7.  CHF On Entresto 8.  Hyperlipidemia On atorvastatin  I,[name],acting as a scribe for [name], MD.,have documented all relevant documentation on the behalf of [name], MD,as directed by  [name], MD while in the presence of [name], MD.   [name], MD  [facility]

## 2019-05-15 ENCOUNTER — Telehealth: Payer: Self-pay

## 2019-05-15 NOTE — Telephone Encounter (Signed)
Pt stated that she fell Friday 05/12/2019 and her tail bone is hurting her. Pt stated that tylenol isn't helping and is requesting Dr. Rosanna Randy send in a pain medication to [facility]. Pt didn't want an appt she stated to ask Dr. Rosanna Randy first. Please advise. Thanks TNP

## 2019-05-15 NOTE — Telephone Encounter (Signed)
Please review. Thanks!  

## 2019-05-16 ENCOUNTER — Other Ambulatory Visit: Payer: Self-pay

## 2019-05-16 DIAGNOSIS — M533 Sacrococcygeal disorders, not elsewhere classified: Secondary | ICD-10-CM

## 2019-05-16 NOTE — Telephone Encounter (Signed)
Tramdol 50mg  q 6 hours prn,#55.,1rf

## 2019-05-17 ENCOUNTER — Other Ambulatory Visit: Payer: Self-pay | Admitting: Family Medicine

## 2019-05-17 ENCOUNTER — Ambulatory Visit: Payer: Medicare Other | Admitting: Family Medicine

## 2019-05-17 DIAGNOSIS — M545 Low back pain, unspecified: Secondary | ICD-10-CM

## 2019-05-17 MED ORDER — TRAMADOL HCL 50 MG PO TABS: 50.0000 mg | ORAL_TABLET | Freq: Four times a day (QID) | ORAL | 1 refills | Status: DC | PRN

## 2019-05-17 NOTE — Progress Notes (Signed)
Back pain--fell on " tail bone"--hurts only there.

## 2019-05-17 NOTE — Telephone Encounter (Signed)
This was ordered. Can you please send? Thanks!

## 2019-06-19 ENCOUNTER — Other Ambulatory Visit: Payer: Self-pay | Admitting: Family Medicine

## 2019-06-19 DIAGNOSIS — R350 Frequency of micturition: Secondary | ICD-10-CM

## 2019-06-19 DIAGNOSIS — E1121 Type 2 diabetes mellitus with diabetic nephropathy: Secondary | ICD-10-CM

## 2019-06-19 DIAGNOSIS — G629 Polyneuropathy, unspecified: Secondary | ICD-10-CM

## 2019-06-19 MED ORDER — IMIPRAMINE HCL 25 MG PO TABS: 25.0000 mg | ORAL_TABLET | Freq: Every day | ORAL | 3 refills | Status: DC

## 2019-06-19 NOTE — Telephone Encounter (Signed)
Ok to refill thx

## 2019-06-19 NOTE — Telephone Encounter (Signed)
[facility] faxed refill request for the following medications:  imipramine (TOFRANIL) 25 MG tablet   Please advise.

## 2019-06-27 ENCOUNTER — Telehealth: Payer: Self-pay | Admitting: Family Medicine

## 2019-06-27 MED ORDER — METOPROLOL TARTRATE 100 MG PO TABS: 100.0000 mg | ORAL_TABLET | Freq: Two times a day (BID) | ORAL | 12 refills | Status: DC

## 2019-06-27 NOTE — Telephone Encounter (Signed)
[facility] faxed refill request for the following medications:  metoprolol (LOPRESSOR) 100 MG tablet    Please advise.  Thanks, [employer]

## 2019-06-29 ENCOUNTER — Telehealth: Payer: Self-pay | Admitting: Family Medicine

## 2019-06-29 DIAGNOSIS — L03115 Cellulitis of right lower limb: Secondary | ICD-10-CM

## 2019-06-29 NOTE — Telephone Encounter (Signed)
From [facility], pleae review

## 2019-06-29 NOTE — Telephone Encounter (Signed)
Pt is calling to see if dr Rosanna Randy would call her abx [facility] in [place] [address]. Pt is having right foot flare up again. Pt said dr Rosanna Randy has seen her for this in past

## 2019-06-29 NOTE — Telephone Encounter (Signed)
Called patient back for more information. Patient was seen 08/24/2018 for cellulitis in right foot and was given cephalexin 500 mg bid. Patient states her right foot has been red again for the last several days. Patient is requesting an antibiotic be sent to pharmacy. Advised pt she will need an appt, however pt does not want to come in to the office,please advise?

## 2019-06-29 NOTE — Telephone Encounter (Signed)
Okay to refill Keflex once.

## 2019-06-30 MED ORDER — CEPHALEXIN 500 MG PO CAPS: 500.0000 mg | ORAL_CAPSULE | Freq: Three times a day (TID) | ORAL | 0 refills | Status: DC

## 2019-06-30 NOTE — Telephone Encounter (Signed)
Patient was advised rx sent to pharmacy.

## 2019-08-24 ENCOUNTER — Telehealth: Payer: Self-pay | Admitting: Family Medicine

## 2019-08-24 NOTE — Telephone Encounter (Signed)
Home Health Verbal Orders - Caller/Agency: Rochel Brome Number: [id number] Requesting OT/PT/Skilled Nursing/Social Work/Speech Therapy: Pt has medication questions, she has been taking 2 500 mg Tylenol every 8 hours. Please advise Requesting for her a nurse call back with clarification for both PT and pt  Pt also had a fall back in September, hit her head. That is when she began taking tylenol again (taking 2 500 MG tablets every 8 hours).

## 2019-08-28 ENCOUNTER — Other Ambulatory Visit: Payer: Self-pay | Admitting: Family Medicine

## 2019-08-28 MED ORDER — OMEPRAZOLE 20 MG PO CPDR: 20.0000 mg | DELAYED_RELEASE_CAPSULE | Freq: Every day | ORAL | 3 refills | Status: DC

## 2019-08-28 NOTE — Telephone Encounter (Signed)
Left message for nurse to call back, okay for [facility] triage nurse to give verbal order on phone to health nurse when he returns call. KW

## 2019-08-28 NOTE — Telephone Encounter (Signed)
OK for OT/PT/skilled nursing/social worker and speech therapy.  Tylenol should be prn only.

## 2019-08-28 NOTE — Telephone Encounter (Signed)
Please advise message below  °

## 2019-08-28 NOTE — Telephone Encounter (Signed)
Refill has been sent to pharmacy.  

## 2019-08-28 NOTE — Telephone Encounter (Signed)
[facility] faxed refill request for the following medications:  omeprazole (PRILOSEC) 20 MG capsule    Please advise. 

## 2019-09-18 ENCOUNTER — Other Ambulatory Visit: Payer: Self-pay | Admitting: Family Medicine

## 2019-09-18 DIAGNOSIS — E1121 Type 2 diabetes mellitus with diabetic nephropathy: Secondary | ICD-10-CM

## 2019-09-18 DIAGNOSIS — R350 Frequency of micturition: Secondary | ICD-10-CM

## 2019-09-18 DIAGNOSIS — G629 Polyneuropathy, unspecified: Secondary | ICD-10-CM

## 2019-10-16 ENCOUNTER — Other Ambulatory Visit: Payer: Self-pay | Admitting: Family Medicine

## 2019-10-16 DIAGNOSIS — R350 Frequency of micturition: Secondary | ICD-10-CM

## 2019-10-16 DIAGNOSIS — G629 Polyneuropathy, unspecified: Secondary | ICD-10-CM

## 2019-10-16 DIAGNOSIS — E1121 Type 2 diabetes mellitus with diabetic nephropathy: Secondary | ICD-10-CM

## 2019-10-30 ENCOUNTER — Other Ambulatory Visit: Payer: Self-pay

## 2019-10-30 ENCOUNTER — Ambulatory Visit: Payer: Medicare PPO | Admitting: Family Medicine

## 2019-10-30 ENCOUNTER — Encounter: Payer: Self-pay | Admitting: Family Medicine

## 2019-10-30 VITALS — BP 154/80 | HR 65 | Temp 97.5°F | Resp 18

## 2019-10-30 DIAGNOSIS — R0789 Other chest pain: Secondary | ICD-10-CM

## 2019-10-30 DIAGNOSIS — E1121 Type 2 diabetes mellitus with diabetic nephropathy: Secondary | ICD-10-CM

## 2019-10-30 DIAGNOSIS — I10 Essential (primary) hypertension: Secondary | ICD-10-CM | POA: Diagnosis not present

## 2019-10-30 DIAGNOSIS — E7849 Other hyperlipidemia: Secondary | ICD-10-CM

## 2019-10-30 DIAGNOSIS — R159 Full incontinence of feces: Secondary | ICD-10-CM

## 2019-10-30 DIAGNOSIS — M79671 Pain in right foot: Secondary | ICD-10-CM

## 2019-10-30 DIAGNOSIS — E039 Hypothyroidism, unspecified: Secondary | ICD-10-CM | POA: Diagnosis not present

## 2019-10-30 DIAGNOSIS — R152 Fecal urgency: Secondary | ICD-10-CM

## 2019-10-30 DIAGNOSIS — I4891 Unspecified atrial fibrillation: Secondary | ICD-10-CM

## 2019-10-30 NOTE — Progress Notes (Signed)
Patient: Dawn Burnett Female    DOB: February 07, 1929   86 y.o.   MRN: [id number] Visit Date: 10/30/2019  Today's Provider: [name], MD   Chief Complaint  Patient presents with  . Foot Pain  . Constipation   Subjective:     HPI   Patient has bunions/sores on her right foot. One is on her heel and on the outside of her big toe. Bunion/sores have been sore for 2 weeks. Bunions have had a blistery appearance for the past two weeks.   Daughter is concerned about the foot being a little cool.  There is no calf pain.  There is no skin breakdown.  There is some chronic pedal edema.  She states her shoes hurt her feet especially around the bunions. Also patient has had constipation and abdominal pain for 10 days. Patient did finely goes to the bathroom today and had a BM. Patient was having pain in her stomach and back.  Her daughter is with her and clarifies the situation.  He actually has urinary incontinence frequently.  And she has trouble making it to the bathroom for bowel movements and then has a mass from the BMs.  She takes Lomotil for this and then gets constipated.  It takes a lot at work is to clean her up and keep her clean from her accidents in the bathroom. Allergies  Allergen Reactions  . Iodinated Diagnostic Agents Rash  . Augmentin [Amoxicillin-Pot Clavulanate] Other (See Comments)    Gi distress   . Codeine   . Crestor [Rosuvastatin] Other (See Comments)    Unknown Unknown  . Levaquin [Levofloxacin In D5w]     Stomach pain, body pain/ache all over Stomach pain, body pain/ache all over  . Naproxen Other (See Comments)    unknown unknown  . Prednisone   . Sulfa Antibiotics   . Sulfacetamide Sodium   . Tolectin [Tolmetin]      Current Outpatient Medications:  .  acetaminophen (TYLENOL) 500 MG tablet, Take 500 mg by mouth every 6 (six) hours as needed for mild pain, moderate pain, fever or headache., Disp: , Rfl:  .  atorvastatin (LIPITOR) 20 MG  tablet, Take 20 mg by mouth at bedtime. , Disp: , Rfl:  .  Calcium-Vitamin D 600-200 MG-UNIT per tablet, Take 600 tablets by mouth daily with supper. , Disp: , Rfl:  .  digoxin (LANOXIN) 0.125 MG tablet, , Disp: , Rfl:  .  ELIQUIS 2.5 MG TABS tablet, , Disp: , Rfl:  .  ENTRESTO 97-103 MG, Take 1 tablet by mouth 2 (two) times daily. , Disp: , Rfl:  .  furosemide (LASIX) 20 MG tablet, Take 20 mg by mouth daily. , Disp: , Rfl:  .  gabapentin (NEURONTIN) 600 MG tablet, TAKE ONE TABLET BY MOUTH THREE TIMES A DAY, Disp: 90 tablet, Rfl: 9 .  imipramine (TOFRANIL) 25 MG tablet, TAKE 1 TABLET BY MOUTH AT BEDTIME, Disp: 30 tablet, Rfl: 3 .  loperamide (IMODIUM) 2 MG capsule, Take 2 mg by mouth as needed. , Disp: , Rfl:  .  Methylcellulose, Laxative, (CITRUCEL) 500 MG TABS, Take 2 tablets by mouth daily., Disp: , Rfl:  .  metoprolol tartrate (LOPRESSOR) 100 MG tablet, Take 1 tablet (100 mg total) by mouth 2 (two) times daily., Disp: 60 tablet, Rfl: 12 .  mirabegron ER (MYRBETRIQ) 50 MG TB24 tablet, Take 1 tablet (50 mg total) by mouth daily., Disp: 30 tablet, Rfl: 11 .  Multiple Vitamins-Minerals (CENTRUM SILVER ULTRA WOMENS PO), Take 1 tablet by mouth daily with lunch. , Disp: , Rfl:  .  NYAMYC powder, APPLY TOPICALLY FOUR TIMES A DAY, Disp: 15 g, Rfl: 0 .  omeprazole (PRILOSEC) 20 MG capsule, Take 1 capsule (20 mg total) by mouth daily., Disp: 90 capsule, Rfl: 3 .  Probiotic Product (ALIGN) 4 MG CAPS, Take by mouth every other day., Disp: , Rfl:  .  SYNTHROID 50 MCG tablet, TAKE ONE TABLET BY MOUTH DAILY BEFORE BREAKFAST, Disp: 90 tablet, Rfl: 3 .  traMADol (ULTRAM) 50 MG tablet, Take 1 tablet (50 mg total) by mouth every 6 (six) hours as needed., Disp: 60 tablet, Rfl: 1 .  UNABLE TO FIND, APPLY TO THE AFFECTED AREA(S) TWICE DAILY ON RIGHT CHEEK FOR 10 DAYS, Disp: , Rfl: 0 .  celecoxib (CELEBREX) 100 MG capsule, Take 2 capsules (200 mg total) by mouth daily., Disp: 90 capsule, Rfl: 0 .  cephALEXin  (KEFLEX) 500 MG capsule, Take 1 capsule (500 mg total) by mouth 2 (two) times daily. (Patient not taking: Reported on 01/03/2019), Disp: 20 capsule, Rfl: 0 .  cephALEXin (KEFLEX) 500 MG capsule, Take 1 capsule (500 mg total) by mouth 3 (three) times daily. (Patient not taking: Reported on 10/30/2019), Disp: 21 capsule, Rfl: 0 .  COUMADIN 1 MG tablet, TAKE 1 (ONE) TABLET TABLET, ORAL, DAILY AS DIRECTED (Patient not taking: Reported on 05/10/2019), Disp: 30 tablet, Rfl: 11 .  COUMADIN 4 MG tablet, TAKE 1 TABLET AS DIRECTED (Patient not taking: Reported on 05/10/2019), Disp: 30 tablet, Rfl: 9 .  diltiazem (CARDIZEM CD) 120 MG 24 hr capsule, Take 120 mg by mouth daily., Disp: , Rfl:  .  diphenoxylate-atropine (LOMOTIL) 2.5-0.025 MG tablet, Take 1 tablet by mouth daily as needed for diarrhea or loose stools. (Patient not taking: Reported on 01/03/2019), Disp: 14 tablet, Rfl: 0 .  mupirocin cream (BACTROBAN) 2 %, Apply 1 application topically daily. (Patient not taking: Reported on 01/03/2019), Disp: 15 g, Rfl: 0 .  nystatin cream (MYCOSTATIN), APPLY  TOPICALLY 2 (TWO) TIMES DAILY. APPLY TO AFFECTED AREA (Patient not taking: Reported on 01/03/2019), Disp: 30 g, Rfl: 1 .  ranitidine (ZANTAC) 150 MG tablet, TAKE 1 TABLET BY MOUTH TWO TIMES DAILY (Patient not taking: Reported on 01/03/2019), Disp: 180 tablet, Rfl: 9 .  warfarin (COUMADIN) 1 MG tablet, Take 1 tablet (1 mg total) by mouth daily. (Patient not taking: Reported on 01/03/2019), Disp: 30 tablet, Rfl: 12 .  warfarin (COUMADIN) 3 MG tablet, Take 1 tablet (3 mg total) by mouth daily. (Patient not taking: Reported on 01/03/2019), Disp: 30 tablet, Rfl: 12  Review of Systems  Constitutional: Negative for appetite change, chills, fatigue and fever.  HENT: Negative.   Eyes: Negative.        She has eyelid surgery on her right lower lid scheduled on her [birth year] next month.  Respiratory: Negative for chest tightness and shortness of breath.   Cardiovascular:  Negative for chest pain and palpitations.  Gastrointestinal: Positive for constipation. Negative for abdominal pain, nausea and vomiting.  Genitourinary: Positive for enuresis.  Musculoskeletal: Positive for arthralgias.  Allergic/Immunologic: Negative.   Neurological: Negative for dizziness and weakness.  Hematological: Negative.   Psychiatric/Behavioral: Negative.     Social History   Tobacco Use  . Smoking status: Never Smoker  . Smokeless tobacco: Never Used  Substance Use Topics  . Alcohol use: No      Objective:   BP (!) 154/80 (BP Location: Left  Arm, Patient Position: Sitting, Cuff Size: Large)   Pulse 65   Temp (!) 97.5 F (36.4 C) (Other (Comment))   Resp 18   SpO2 97%  Vitals:   10/30/19 1613  BP: (!) 154/80  Pulse: 65  Resp: 18  Temp: (!) 97.5 F (36.4 C)  TempSrc: Other (Comment)  SpO2: 97%  There is no height or weight on file to calculate BMI.   Physical Exam Vitals reviewed.  HENT:     Head: Normocephalic and atraumatic.     Right Ear: External ear normal.     Left Ear: External ear normal.  Eyes:     General: No scleral icterus.    Conjunctiva/sclera: Conjunctivae normal.  Cardiovascular:     Rate and Rhythm: Normal rate and regular rhythm.     Heart sounds: Normal heart sounds.     Comments: She has right posterior tibial pulse that I can palpate.  I think the dorsalis pedis pulse is minimal.  The toes are cool but not cold. Pulmonary:     Effort: Pulmonary effort is normal.     Breath sounds: Normal breath sounds.  Abdominal:     General: There is no distension.     Palpations: Abdomen is soft. There is no mass.     Tenderness: There is no abdominal tenderness.  Musculoskeletal:     Right lower leg: Edema present.     Left lower leg: Edema present.  Skin:    General: Skin is warm and dry.     Capillary Refill: Capillary refill takes 2 to 3 seconds.  Neurological:     General: No focal deficit present.     Mental Status: She is alert  and oriented to person, place, and time.  Psychiatric:        Mood and Affect: Mood normal.        Behavior: Behavior normal.        Thought Content: Thought content normal.        Judgment: Judgment normal.      No results found for any visits on 10/30/19.     Assessment & Plan    1. Type 2 diabetes mellitus with diabetic nephropathy, without long-term current use of insulin ([facility]) Check A1c. - CBC w/Diff/Platelet - TSH - Comprehensive Metabolic Panel (CMET) - Hemoglobin A1C  2. Essential (primary) hypertension Fair control.  I think this is good for this 86 year old. - CBC w/Diff/Platelet - TSH - Comprehensive Metabolic Panel (CMET) - Hemoglobin A1C  3. Adult hypothyroidism  - CBC w/Diff/Platelet - TSH - Comprehensive Metabolic Panel (CMET) - Hemoglobin A1C  4. Other hyperlipidemia On Lipitor - CBC w/Diff/Platelet - TSH - Comprehensive Metabolic Panel (CMET) - Hemoglobin A1C  5. Right foot pain I think this is bunion possibly arthritic changes.  Obtain x-ray.  I do not think this is PAD. - DG Foot Complete Right  6. Atrial fibrillation, unspecified type [facility]) Per cardiology.  7. Incontinence of feces with fecal urgency I have advised to try to stop taking Lomotil as I think this aggravates the problem.  Consider Metamucil daily or a low-dose of GlycoLax daily.  Patient has become much more debilitated over the past couple of years.  8. Anterior chest wall pain Tender left anterior upper rib.  Follow clinically.   Follow up in 4 months.   I,[name],acting as a scribe for [name], MD.,have documented all relevant documentation on the behalf of [name], MD,as directed by  Miguel Aschoff  Brooke Bonito, MD while in the presence of [name], MD.      [name], MD  [facility]

## 2019-10-31 LAB — CBC WITH DIFFERENTIAL/PLATELET
Basophils Absolute: 0 10*3/uL (ref 0.0–0.2)
Basos: 0 %
EOS (ABSOLUTE): 0 10*3/uL (ref 0.0–0.4)
Eos: 1 %
Hematocrit: 39.8 % (ref 34.0–46.6)
Hemoglobin: 13.7 g/dL (ref 11.1–15.9)
Immature Grans (Abs): 0 10*3/uL (ref 0.0–0.1)
Immature Granulocytes: 0 %
Lymphocytes Absolute: 1.7 10*3/uL (ref 0.7–3.1)
Lymphs: 27 %
MCH: 33.4 pg — ABNORMAL HIGH (ref 26.6–33.0)
MCHC: 34.4 g/dL (ref 31.5–35.7)
MCV: 97 fL (ref 79–97)
Monocytes Absolute: 0.6 10*3/uL (ref 0.1–0.9)
Monocytes: 10 %
Neutrophils Absolute: 4 10*3/uL (ref 1.4–7.0)
Neutrophils: 62 %
Platelets: 129 10*3/uL — ABNORMAL LOW (ref 150–450)
RBC: 4.1 x10E6/uL (ref 3.77–5.28)
RDW: 12.2 % (ref 11.7–15.4)
WBC: 6.4 10*3/uL (ref 3.4–10.8)

## 2019-10-31 LAB — HEMOGLOBIN A1C
Est. average glucose Bld gHb Est-mCnc: 263 mg/dL
Hgb A1c MFr Bld: 10.8 % — ABNORMAL HIGH (ref 4.8–5.6)

## 2019-10-31 LAB — COMPREHENSIVE METABOLIC PANEL
ALT: 10 IU/L (ref 0–32)
AST: 16 IU/L (ref 0–40)
Albumin/Globulin Ratio: 1.8 (ref 1.2–2.2)
Albumin: 3.9 g/dL (ref 3.6–4.6)
Alkaline Phosphatase: 68 IU/L (ref 39–117)
BUN/Creatinine Ratio: 12 (ref 12–28)
BUN: 12 mg/dL (ref 8–27)
Bilirubin Total: 0.8 mg/dL (ref 0.0–1.2)
CO2: 30 mmol/L — ABNORMAL HIGH (ref 20–29)
Calcium: 9.3 mg/dL (ref 8.7–10.3)
Chloride: 95 mmol/L — ABNORMAL LOW (ref 96–106)
Creatinine, Ser: 0.98 mg/dL (ref 0.57–1.00)
GFR calc Af Amer: 60 mL/min/{1.73_m2} (ref >59)
GFR calc non Af Amer: 52 mL/min/{1.73_m2} — ABNORMAL LOW (ref >59)
Globulin, Total: 2.2 g/dL (ref 1.5–4.5)
Glucose: 450 mg/dL — ABNORMAL HIGH (ref 65–99)
Potassium: 3.6 mmol/L (ref 3.5–5.2)
Sodium: 140 mmol/L (ref 134–144)
Total Protein: 6.1 g/dL (ref 6.0–8.5)

## 2019-10-31 LAB — TSH: TSH: 1.26 u[IU]/mL (ref 0.450–4.500)

## 2019-11-01 ENCOUNTER — Telehealth: Payer: Self-pay | Admitting: *Deleted

## 2019-11-01 ENCOUNTER — Telehealth: Payer: Self-pay

## 2019-11-01 ENCOUNTER — Telehealth: Payer: Self-pay | Admitting: Family Medicine

## 2019-11-01 DIAGNOSIS — E1121 Type 2 diabetes mellitus with diabetic nephropathy: Secondary | ICD-10-CM

## 2019-11-01 MED ORDER — METFORMIN HCL 500 MG PO TABS: 500.0000 mg | ORAL_TABLET | Freq: Two times a day (BID) | ORAL | 0 refills | Status: DC

## 2019-11-01 NOTE — Telephone Encounter (Signed)
Spoke to patient's son and he told me he had just called here right before I called to advise him on his mother's lab results. Medication has been sent to pharmacy for the patient.

## 2019-11-01 NOTE — Telephone Encounter (Signed)
-----   Message from Jerrol Banana., MD sent at 11/01/2019 10:53 AM EDT ----- Diabetes out-of-control.  Would start Metformin 500 mg twice a day.  See me back in a month.

## 2019-11-01 NOTE — Telephone Encounter (Signed)
Pt son is calling and he looked in his mother chart under labs results he is  aware of message concerning dm is out of control. Per son please call [facility]

## 2019-11-01 NOTE — Telephone Encounter (Signed)
It looksl ike this has already been done

## 2019-11-01 NOTE — Telephone Encounter (Signed)
Daughter, Dawn Burnett called in for her mother's lab results.  I read her Dr. Alben Spittle message regarding starting the Metformin 500 mg twice a day due to her diabetes being out of control.  I also let [name] know Dawn Burnett had called in also and was given this message too.    "I'm sorry I didn't realize [name] had already called".   She thanked me for my help.

## 2019-11-01 NOTE — Telephone Encounter (Signed)
Patient's son [name] advised on lab results and medication.

## 2019-11-03 ENCOUNTER — Other Ambulatory Visit: Payer: Self-pay

## 2019-11-03 ENCOUNTER — Ambulatory Visit
Admission: RE | Admit: 2019-11-03 | Discharge: 2019-11-03 | Disposition: A | Discharge status: Home / Self Care | Payer: Medicare PPO | Attending: Family Medicine | Admitting: Family Medicine

## 2019-11-03 ENCOUNTER — Ambulatory Visit
Admission: RE | Admit: 2019-11-03 | Discharge: 2019-11-03 | Disposition: A | Discharge status: Home / Self Care | Payer: Medicare PPO | Source: Ambulatory Visit | Attending: Family Medicine | Admitting: Family Medicine

## 2019-11-03 DIAGNOSIS — M79671 Pain in right foot: Secondary | ICD-10-CM | POA: Insufficient documentation

## 2019-11-27 ENCOUNTER — Other Ambulatory Visit: Payer: Self-pay | Admitting: Family Medicine

## 2019-11-27 DIAGNOSIS — E1121 Type 2 diabetes mellitus with diabetic nephropathy: Secondary | ICD-10-CM

## 2019-11-29 NOTE — Progress Notes (Signed)
Established patient visit    Patient: Dawn Burnett   DOB: Jul 27, 1929   86 y.o. Female  MRN: [id number] Visit Date: 12/05/2019  Today's healthcare provider: [name], MD   Chief Complaint  Patient presents with  . Follow-up  . Diabetes   Subjective    HPI  Patient's main complaint is 1 of ongoing arthritic pain.  She states it was controlled on Celebrex.  I stopped it because of her age and she is on an oral anticoagulant. The other issue is been some weight loss.  This is unintentional.  There are no systemic symptoms. She sees both cardiology and pulmonology.  She states she is taking her medication as directed. 1. Type 2 diabetes mellitus with diabetic nephropathy, without long-term current use of insulin ([place]) -10/30/19 Labs checked showing-Diabetes out-of-control. Would start Metformin 500 mg twice a day. See me back in a month. Hemoglobin A1C 10.6.  Past Medical History:  Diagnosis Date  . Arthritis   . Atrial fibrillation ([place])   . CHF (congestive heart failure) ([place])   . GERD (gastroesophageal reflux disease)   . Hiatal hernia   . Hypertension   . Skin cancer, basal cell   . Thyroid disease        Medications: Outpatient Medications Prior to Visit  Medication Sig  . acetaminophen (TYLENOL) 500 MG tablet Take 500 mg by mouth every 6 (six) hours as needed for mild pain, moderate pain, fever or headache.  Marland Kitchen atorvastatin (LIPITOR) 20 MG tablet Take 20 mg by mouth at bedtime.   . Calcium-Vitamin D 600-200 MG-UNIT per tablet Take 600 tablets by mouth daily with supper.   . cephALEXin (KEFLEX) 500 MG capsule Take 1 capsule (500 mg total) by mouth 2 (two) times daily.  . cephALEXin (KEFLEX) 500 MG capsule Take 1 capsule (500 mg total) by mouth 3 (three) times daily.  Marland Kitchen COUMADIN 1 MG tablet TAKE 1 (ONE) TABLET TABLET, ORAL, DAILY AS DIRECTED  . COUMADIN 4 MG tablet TAKE 1 TABLET AS DIRECTED  . digoxin (LANOXIN) 0.125 MG tablet   . diltiazem (CARDIZEM CD) 120  MG 24 hr capsule Take 120 mg by mouth daily.  . diphenoxylate-atropine (LOMOTIL) 2.5-0.025 MG tablet Take 1 tablet by mouth daily as needed for diarrhea or loose stools.  Marland Kitchen ELIQUIS 2.5 MG TABS tablet   . ENTRESTO 97-103 MG Take 1 tablet by mouth 2 (two) times daily.   . furosemide (LASIX) 20 MG tablet Take 20 mg by mouth daily.   Marland Kitchen gabapentin (NEURONTIN) 600 MG tablet TAKE ONE TABLET BY MOUTH THREE TIMES A DAY  . imipramine (TOFRANIL) 25 MG tablet TAKE 1 TABLET BY MOUTH AT BEDTIME  . loperamide (IMODIUM) 2 MG capsule Take 2 mg by mouth as needed.   . metFORMIN (GLUCOPHAGE) 500 MG tablet TAKE ONE TABLET TWICE DAILY WITH A MEAL  . Methylcellulose, Laxative, (CITRUCEL) 500 MG TABS Take 2 tablets by mouth daily.  . metoprolol tartrate (LOPRESSOR) 100 MG tablet Take 1 tablet (100 mg total) by mouth 2 (two) times daily.  . mirabegron ER (MYRBETRIQ) 50 MG TB24 tablet Take 1 tablet (50 mg total) by mouth daily.  . Multiple Vitamins-Minerals (CENTRUM SILVER ULTRA WOMENS PO) Take 1 tablet by mouth daily with lunch.   . mupirocin cream (BACTROBAN) 2 % Apply 1 application topically daily.  Marland Kitchen NYAMYC powder APPLY TOPICALLY FOUR TIMES A DAY  . nystatin cream (MYCOSTATIN) APPLY  TOPICALLY 2 (TWO) TIMES DAILY. APPLY TO AFFECTED AREA  .  omeprazole (PRILOSEC) 20 MG capsule Take 1 capsule (20 mg total) by mouth daily.  . Probiotic Product (ALIGN) 4 MG CAPS Take by mouth every other day.  . ranitidine (ZANTAC) 150 MG tablet TAKE 1 TABLET BY MOUTH TWO TIMES DAILY  . SYNTHROID 50 MCG tablet TAKE ONE TABLET BY MOUTH DAILY BEFORE BREAKFAST  . traMADol (ULTRAM) 50 MG tablet Take 1 tablet (50 mg total) by mouth every 6 (six) hours as needed.  Marland Kitchen UNABLE TO FIND APPLY TO THE AFFECTED AREA(S) TWICE DAILY ON RIGHT CHEEK FOR 10 DAYS  . warfarin (COUMADIN) 1 MG tablet Take 1 tablet (1 mg total) by mouth daily.  Marland Kitchen warfarin (COUMADIN) 3 MG tablet Take 1 tablet (3 mg total) by mouth daily.  . [DISCONTINUED] celecoxib (CELEBREX)  100 MG capsule Take 2 capsules (200 mg total) by mouth daily.   No facility-administered medications prior to visit.    Review of Systems  Constitutional: Positive for unexpected weight change. Negative for appetite change, chills, fatigue and fever.  HENT: Negative.   Eyes: Negative.   Respiratory: Negative for chest tightness and shortness of breath.   Cardiovascular: Negative for chest pain and palpitations.  Gastrointestinal: Negative for abdominal pain, nausea and vomiting.  Endocrine: Negative.   Genitourinary: Negative.   Musculoskeletal: Positive for arthralgias.  Neurological: Negative for dizziness and weakness.  Hematological: Negative.   Psychiatric/Behavioral: Negative.     Last hemoglobin A1c Lab Results  Component Value Date   HGBA1C 10.8 (H) 10/30/2019       Objective    BP (!) 148/66 (BP Location: Left Arm, Patient Position: Sitting, Cuff Size: Large)   Pulse 60   Temp (!) 97.1 F (36.2 C) (Other (Comment))   Resp 18   Ht '5\' 7"'$  (1.702 m)   Wt 152 lb (68.9 kg)   SpO2 97%   BMI 23.81 kg/m  BP Readings from Last 3 Encounters:  12/05/19 (!) 148/66  10/30/19 (!) 154/80  05/10/19 124/74   Wt Readings from Last 3 Encounters:  12/05/19 152 lb (68.9 kg)  05/10/19 176 lb (79.8 kg)  08/24/18 175 lb (79.4 kg)      Physical Exam Vitals reviewed.  HENT:     Head: Normocephalic and atraumatic.     Right Ear: External ear normal.     Left Ear: External ear normal.  Eyes:     General: No scleral icterus.    Conjunctiva/sclera: Conjunctivae normal.  Cardiovascular:     Rate and Rhythm: Normal rate and regular rhythm.     Heart sounds: Normal heart sounds.  Pulmonary:     Effort: Pulmonary effort is normal.     Breath sounds: Normal breath sounds.  Abdominal:     General: There is no distension.     Palpations: Abdomen is soft. There is no mass.     Tenderness: There is no abdominal tenderness.  Musculoskeletal:     Right lower leg: Edema present.       Left lower leg: Edema present.  Skin:    General: Skin is warm and dry.     Capillary Refill: Capillary refill takes 2 to 3 seconds.  Neurological:     General: No focal deficit present.     Mental Status: She is alert and oriented to person, place, and time.  Psychiatric:        Mood and Affect: Mood normal.        Behavior: Behavior normal.        Thought Content:  Thought content normal.        Judgment: Judgment normal.       Results for orders placed or performed in visit on 12/05/19  POCT Glucose (Device for Home Use)  Result Value Ref Range   POC Glucose 161 (A) 70 - 99 mg/dl     Assessment & Plan    1. Type 2 diabetes mellitus with diabetic nephropathy, without long-term current use of insulin ([place]) Patient wishes to work on just her eating habits.  We will have her cut back on carbs.  We went over this in detail - POCT Glucose (Device for Home Use) 161 today. - blood glucose meter kit and supplies; Dispense based on patient and insurance preference. Use once daily as directed. (FOR ICD-10 E11.21).  Dispense: 1 each; Refill: 0  2. Chronic combined systolic and diastolic congestive heart failure ([place]) On Entresto.  Followed by cardiology   3. Atrial fibrillation, controlled ([place]) On Eliquis  4. Arteriosclerosis of coronary artery All risk factors treated  5. Gastroesophageal reflux disease without esophagitis   6. Adult hypothyroidism   7. Primary osteoarthritis of both knees After lengthy conversation with the patient and her daughter I am reluctantly given her back to Celebrex.  I explained her that she could easily bleed to death from a GI bleed on the Eliquis.  She is willing to accept this risk because the Celebrex really helps her pain she says.  100 mg dose was given.  8. Mixed stress and urge urinary incontinence   9. Gastrointestinal stromal tumor (GIST) of stomach ([place]) May need to further evaluate this in face of weight loss.  Overall other  than her pain she feels fairly well.  As I discussed with the patient and her daughter I am very reluctant to put her through full work-up and 86 years old and frail health.  She has had both Covid vaccines.     Return in about 2 months (around 02/04/2020).      I, [name], MD, have reviewed all documentation for this visit. The documentation on 12/08/19 for the exam, diagnosis, procedures, and orders are all accurate and complete.    Lorenda Grecco Cranford Mon, MD  [facility] [phone] (phone) [phone] (fax)  [place]

## 2019-12-05 ENCOUNTER — Other Ambulatory Visit: Payer: Self-pay

## 2019-12-05 ENCOUNTER — Encounter: Payer: Self-pay | Admitting: Family Medicine

## 2019-12-05 ENCOUNTER — Telehealth: Payer: Self-pay

## 2019-12-05 ENCOUNTER — Ambulatory Visit: Payer: Medicare PPO | Admitting: Family Medicine

## 2019-12-05 VITALS — BP 148/66 | HR 60 | Temp 97.1°F | Resp 18 | Ht 67.0 in | Wt 152.0 lb

## 2019-12-05 DIAGNOSIS — I4891 Unspecified atrial fibrillation: Secondary | ICD-10-CM | POA: Diagnosis not present

## 2019-12-05 DIAGNOSIS — E1121 Type 2 diabetes mellitus with diabetic nephropathy: Secondary | ICD-10-CM

## 2019-12-05 DIAGNOSIS — I2510 Atherosclerotic heart disease of native coronary artery without angina pectoris: Secondary | ICD-10-CM | POA: Diagnosis not present

## 2019-12-05 DIAGNOSIS — I5042 Chronic combined systolic (congestive) and diastolic (congestive) heart failure: Secondary | ICD-10-CM

## 2019-12-05 DIAGNOSIS — K219 Gastro-esophageal reflux disease without esophagitis: Secondary | ICD-10-CM

## 2019-12-05 DIAGNOSIS — N3946 Mixed incontinence: Secondary | ICD-10-CM

## 2019-12-05 DIAGNOSIS — C49A2 Gastrointestinal stromal tumor of stomach: Secondary | ICD-10-CM

## 2019-12-05 DIAGNOSIS — M170 Bilateral primary osteoarthritis of knee: Secondary | ICD-10-CM

## 2019-12-05 DIAGNOSIS — E039 Hypothyroidism, unspecified: Secondary | ICD-10-CM

## 2019-12-05 LAB — POCT GLUCOSE (DEVICE FOR HOME USE): POC Glucose: 161 mg/dl — AB (ref 70–99)

## 2019-12-05 MED ORDER — CELECOXIB 100 MG PO CAPS: 200.0000 mg | ORAL_CAPSULE | Freq: Every day | ORAL | 5 refills | Status: DC | PRN

## 2019-12-05 MED ORDER — BLOOD GLUCOSE METER KIT: PACK | 0 refills | Status: DC

## 2019-12-05 NOTE — Telephone Encounter (Signed)
Copied from [place] [id number]. Topic: General - Other >> Dec 05, 2019  2:05 PM Greggory Keen D wrote: Reason for CRM: Amy at [facility] called saying Mrs. Laker told her Dr. Rosanna Randy was sending a prescription over for a new diabetes glucose kit and they did'nt receive anything.  [facility] # [phone]

## 2019-12-05 NOTE — Patient Instructions (Signed)

## 2019-12-05 NOTE — Telephone Encounter (Signed)
Rx was faxed.

## 2019-12-25 ENCOUNTER — Other Ambulatory Visit: Payer: Self-pay | Admitting: Family Medicine

## 2019-12-25 DIAGNOSIS — E1121 Type 2 diabetes mellitus with diabetic nephropathy: Secondary | ICD-10-CM

## 2020-01-16 ENCOUNTER — Other Ambulatory Visit: Payer: Self-pay | Admitting: Family Medicine

## 2020-01-16 NOTE — Telephone Encounter (Signed)
Requested Prescriptions  Pending Prescriptions Disp Refills  . MYRBETRIQ 50 MG TB24 tablet [Pharmacy Med Name: MYRBETRIQ 50 MG TAB] 90 tablet 3    Sig: TAKE 1 TABLET BY MOUTH DAILY     Urology: Bladder Agents - mirabegron Failed - 01/16/2020 10:35 AM      Failed - Last BP in normal range    BP Readings from Last 1 Encounters:  12/05/19 (!) 148/66         Passed - Valid encounter within last 12 months    Recent Outpatient Visits          1 month ago Type 2 diabetes mellitus with diabetic nephropathy, without long-term current use of insulin ([place])   [facility] Jerrol Banana., MD   2 months ago Type 2 diabetes mellitus with diabetic nephropathy, without long-term current use of insulin [facility])   [facility] Jerrol Banana., MD   8 months ago Atrial fibrillation, unspecified type [facility])   [facility] Jerrol Banana., MD   1 year ago Type 2 diabetes mellitus with diabetic nephropathy, without long-term current use of insulin [facility])   [facility] Jerrol Banana., MD   1 year ago Pre-ulcerative corn or callous   [facility] Birdie Sons, MD      Future Appointments            In 3 weeks Jerrol Banana., MD [facility], [place]   In 1 month Jerrol Banana., MD [facility]

## 2020-01-22 ENCOUNTER — Other Ambulatory Visit: Payer: Self-pay | Admitting: Family Medicine

## 2020-01-22 DIAGNOSIS — E1121 Type 2 diabetes mellitus with diabetic nephropathy: Secondary | ICD-10-CM

## 2020-01-23 NOTE — Progress Notes (Signed)
Subjective:   Dawn Burnett is a 86 y.o. female who presents for Medicare Annual (Subsequent) preventive examination.  I connected with Dawn Burnett today by telephone and verified that I am speaking with the correct person using two identifiers. Location patient: home Location provider: work Persons participating in the virtual visit: patient, provider.   I discussed the limitations, risks, security and privacy concerns of performing an evaluation and management service by telephone and the availability of in person appointments. I also discussed with the patient that there may be a patient responsible charge related to this service. The patient expressed understanding and verbally consented to this telephonic visit.    Interactive audio and video telecommunications were attempted between this provider and patient, however failed, due to patient having technical difficulties OR patient did not have access to video capability.  We continued and completed visit with audio only.  Review of Systems:  N/A  Cardiac Risk Factors include: advanced age (>60mn, >>78women);diabetes mellitus;hypertension;dyslipidemia     Objective:     Vitals: There were no vitals taken for this visit.  There is no height or weight on file to calculate BMI.  Advanced Directives 01/24/2020 01/03/2019 07/29/2018 12/29/2017 07/29/2017 03/31/2017 12/17/2016  Does Patient Have a Medical Advance Directive? No No No No Yes No No  Would patient like information on creating a medical advance directive? No - Patient declined No - Patient declined No - Patient declined No - Patient declined - No - Patient declined No - Patient declined    Tobacco Social History   Tobacco Use  Smoking Status Never Smoker  Smokeless Tobacco Never Used     Counseling given: Not Answered   Clinical Intake:  Pre-visit preparation completed: Yes  Pain : 0-10 Pain Score: 8  Pain Type: Neuropathic pain (Has neuropathy in both  feet.) Pain Location: Foot Pain Orientation: Right, Left Pain Descriptors / Indicators: Aching, Burning Pain Frequency: Constant     Nutritional Risks: Nausea/ vomitting/ diarrhea (Has had some nausea over the past couple weeks. Son relates to medications.) Diabetes: Yes  How often do you need to have someone help you when you read instructions, pamphlets, or other written materials from your doctor or pharmacy?: 1 - Never   Diabetes:  Is the patient diabetic?  Yes  If diabetic, was a CBG obtained today?  No  Did the patient bring in their glucometer from home?  No  How often do you monitor your CBG's? Once a week.   Financial Strains and Diabetes Management:  Are you having any financial strains with the device, your supplies or your medication? No .  Does the patient want to be seen by Chronic Care Management for management of their diabetes?  No  Would the patient like to be referred to a Nutritionist or for Diabetic Management?  No   Diabetic Exams:  Diabetic Eye Exam: Completed 06/2018. Overdue for diabetic eye exam. Pt has been advised about the importance in completing this exam. Pt has an eye exam scheduled for 06/2020.  Diabetic Foot Exam: Completed 10/18/18. Pt has been advised about the importance in completing this exam. Note made to follow up on this at next in office apt.     Interpreter Needed?: No  Information [facility] by :: [facility] L[facility]  Past Medical History:  Diagnosis Date  . Arthritis   . Atrial fibrillation ([place]   . CHF (congestive heart failure) ([place]   . Diabetes mellitus without complication ([place]   . GERD (  gastroesophageal reflux disease)   . Hiatal hernia   . Hypertension   . Skin cancer, basal cell   . Thyroid disease    Past Surgical History:  Procedure Laterality Date  . ABDOMINAL HYSTERECTOMY    . APPENDECTOMY    . BREAST SURGERY    . cateract extraction    . EYE SURGERY     done by Dr. Vickki Muff  . eyelid     drooping right  eyelid  . KNEE SURGERY    . 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 right hip surgery    . right total knee replacement     Family History  Problem Relation Age of Onset  . Heart disease Mother   . Hypertension Mother   . Heart attack Mother   . Stomach cancer Father   . Liver cancer Brother   . Hypertension Son   . Huntington's disease Sister   . Diabetes Sister   . Kidney disease Sister   . Congestive Heart Failure Sister   . Pneumonia Sister   . Stroke Brother   . Parkinson's disease Brother   . Hypertension Brother   . Congestive Heart Failure Brother    Social History   Socioeconomic History  . Marital status: Widowed    Spouse name: widowed  . Number of children: 2  . Years of education: 29  . Highest education level: 12th grade  Occupational History  . Occupation: retired  Tobacco Use  . Smoking status: Never Smoker  . Smokeless tobacco: Never Used  Vaping Use  . Vaping Use: Never used  Substance and Sexual Activity  . Alcohol use: No  . Drug use: No  . Sexual activity: Never  Other Topics Concern  . Not on file  Social History Narrative  . Not on file   Social Determinants of Health   Financial Resource Strain: Low Risk   . Difficulty of Paying Living Expenses: Not hard at all  Food Insecurity: No Food Insecurity  . Worried About Charity fundraiser in the Last Year: Never true  . Ran Out of Food in the Last Year: Never true  Transportation Needs: No Transportation Needs  . Lack of Transportation (Medical): No  . Lack of Transportation (Non-Medical): No  Physical Activity: Inactive  . Days of Exercise per Week: 0 days  . Minutes of Exercise per Session: 0 min  Stress: No Stress Concern Present  . Feeling of Stress : Only a little  Social Connections: Socially Isolated  . Frequency of Communication with Friends and Family: More than three times a week  . Frequency of Social Gatherings with Friends and Family: More  than three times a week  . Attends Religious Services: Never  . Active Member of Clubs or Organizations: No  . Attends Archivist Meetings: Never  . Marital Status: Widowed    Outpatient Encounter Medications as of 01/24/2020  Medication Sig  . acetaminophen (TYLENOL) 500 MG tablet Take 500 mg by mouth every 6 (six) hours as needed for mild pain, moderate pain, fever or headache.  Marland Kitchen atorvastatin (LIPITOR) 20 MG tablet Take 20 mg by mouth at bedtime.   . blood glucose meter kit and supplies Dispense based on patient and insurance preference. Use once daily as directed. (FOR ICD-10 E11.21).  . Calcium-Vitamin D 600-200 MG-UNIT per tablet Take 600 tablets by mouth daily with supper.   . celecoxib (CELEBREX) 100 MG capsule Take 2 capsules (200 mg  total) by mouth daily as needed. (Patient taking differently: Take 200 mg by mouth daily as needed. Taking 1 capsule BID)  . digoxin (LANOXIN) 0.125 MG tablet Take 0.125 mg by mouth every other day.   Marland Kitchen ELIQUIS 2.5 MG TABS tablet Take 2.5 mg by mouth 2 (two) times daily.   Marland Kitchen ENTRESTO 97-103 MG Take 1 tablet by mouth 2 (two) times daily.   . furosemide (LASIX) 20 MG tablet Take 20 mg by mouth daily.   Marland Kitchen gabapentin (NEURONTIN) 600 MG tablet TAKE ONE TABLET BY MOUTH THREE TIMES A DAY  . imipramine (TOFRANIL) 25 MG tablet TAKE 1 TABLET BY MOUTH AT BEDTIME  . loperamide (IMODIUM) 2 MG capsule Take 2 mg by mouth as needed.   . metFORMIN (GLUCOPHAGE) 500 MG tablet TAKE ONE TABLET TWICE DAILY WITH A MEAL  . Methylcellulose, Laxative, (CITRUCEL) 500 MG TABS Take 2 tablets by mouth daily.  . metoprolol tartrate (LOPRESSOR) 100 MG tablet Take 1 tablet (100 mg total) by mouth 2 (two) times daily.  . Multiple Vitamins-Minerals (CENTRUM SILVER ULTRA WOMENS PO) Take 1 tablet by mouth daily with lunch.   Marland Kitchen MYRBETRIQ 50 MG TB24 tablet TAKE 1 TABLET BY MOUTH DAILY  . NYAMYC powder APPLY TOPICALLY FOUR TIMES A DAY (Patient taking differently: As needed)  .  omeprazole (PRILOSEC) 20 MG capsule Take 1 capsule (20 mg total) by mouth daily.  . Probiotic Product (ALIGN) 4 MG CAPS Take by mouth every other day.  Marland Kitchen SYNTHROID 50 MCG tablet TAKE ONE TABLET BY MOUTH DAILY BEFORE BREAKFAST  . cephALEXin (KEFLEX) 500 MG capsule Take 1 capsule (500 mg total) by mouth 2 (two) times daily.  . cephALEXin (KEFLEX) 500 MG capsule Take 1 capsule (500 mg total) by mouth 3 (three) times daily. (Patient not taking: Reported on 01/24/2020)  . COUMADIN 1 MG tablet TAKE 1 (ONE) TABLET TABLET, ORAL, DAILY AS DIRECTED  . COUMADIN 4 MG tablet TAKE 1 TABLET AS DIRECTED  . diltiazem (CARDIZEM CD) 120 MG 24 hr capsule Take 120 mg by mouth daily.  . diphenoxylate-atropine (LOMOTIL) 2.5-0.025 MG tablet Take 1 tablet by mouth daily as needed for diarrhea or loose stools. (Patient not taking: Reported on 01/24/2020)  . mupirocin cream (BACTROBAN) 2 % Apply 1 application topically daily. (Patient not taking: Reported on 01/24/2020)  . nystatin cream (MYCOSTATIN) APPLY  TOPICALLY 2 (TWO) TIMES DAILY. APPLY TO AFFECTED AREA (Patient not taking: Reported on 01/24/2020)  . ranitidine (ZANTAC) 150 MG tablet TAKE 1 TABLET BY MOUTH TWO TIMES DAILY (Patient not taking: Reported on 01/24/2020)  . traMADol (ULTRAM) 50 MG tablet Take 1 tablet (50 mg total) by mouth every 6 (six) hours as needed. (Patient not taking: Reported on 01/24/2020)  . UNABLE TO FIND APPLY TO THE AFFECTED AREA(S) TWICE DAILY ON RIGHT CHEEK FOR 10 DAYS (Patient not taking: Reported on 01/24/2020)  . warfarin (COUMADIN) 1 MG tablet Take 1 tablet (1 mg total) by mouth daily.  Marland Kitchen warfarin (COUMADIN) 3 MG tablet Take 1 tablet (3 mg total) by mouth daily.   No facility-administered encounter medications on file as of 01/24/2020.    Activities of Daily Living In your present state of health, do you have any difficulty performing the following activities: 01/24/2020  Hearing? Y  Comment Does not wearing hearing aids.  Vision? N   Difficulty concentrating or making decisions? N  Walking or climbing stairs? Y  Comment Due to ortho issues and neuropathy.  Dressing or bathing? N  Doing  errands, shopping? Y  Comment Does not drive.  Preparing Food and eating ? Y  Comment Does not do much cooking.  Using the Toilet? N  In the past six months, have you accidently leaked urine? Y  Comment Currently on Myrbetriq. Wears depends at all times.  Do you have problems with loss of bowel control? N  Managing your Medications? Y  Comment Son preps medications in a 7 day pill box every week.  Managing your Finances? Y  Comment Daughter assists with finances.  Housekeeping or managing your Housekeeping? Y  Comment Has assistance cleaning home.  Some recent data might be hidden    Patient Care Team: Maple Hudson., MD as PCP - General (Family Medicine) Debbrah Alar, MD as Consulting Physician (Dermatology) Laurier Nancy, MD as Consulting Physician (Cardiology) Gwyneth Revels, DPM as Referring Physician (Podiatry)    Assessment:   This is a routine wellness examination for Dawn Burnett.  Exercise Activities and Dietary recommendations Current Exercise Habits: The patient does not participate in regular exercise at present, Exercise limited by: orthopedic condition(s);neurologic condition(s)  Goals    . DIET - INCREASE WATER INTAKE     Recommend increasing water intake to 4-6 glasses a day.     . Prevent falls     Recommend to remove any items from the home that may cause slips or trips.       Fall Risk: Fall Risk  01/24/2020 01/03/2019 12/29/2017 12/17/2016 12/24/2015  Falls in the past year? 1 0 No Yes Yes  Number falls in past yr: 1 - - 2 or more 1  Injury with Fall? 1 - - No Yes  Risk Factor Category  - - - High Fall Risk -  Risk for fall due to : Impaired mobility;Impaired balance/gait;Other (Comment) - - - -  Risk for fall due to: Comment Has neuropathy. - - - -  Follow up Falls prevention discussed - -  Falls prevention discussed Falls evaluation completed  Comment Declined PT referral fall risks. - - - -    FALL RISK PREVENTION PERTAINING TO THE HOME:  Any stairs in or around the home? Yes  If so, are there any without handrails? No   Home free of loose throw rugs in walkways, pet beds, electrical cords, etc? Yes  Adequate lighting in your home to reduce risk of falls? Yes   ASSISTIVE DEVICES UTILIZED TO PREVENT FALLS:  Life alert? Yes  Use of a cane, walker or w/c? Yes  Grab bars in the bathroom? Yes  Shower chair or bench in shower? Yes  Elevated toilet seat or a handicapped toilet? Yes   TIMED UP AND GO:  Was the test performed? No .    Depression Screen PHQ 2/9 Scores 01/24/2020 01/03/2019 12/29/2017 12/29/2017  PHQ - 2 Score - 0 0 0  PHQ- 9 Score - - 2 -  Exception Documentation Other- indicate reason in comment box - - -  Not completed Completed AWV with son due to hearing issue with pt. Son was unable to answer these questions for her. - - -     Cognitive Function     6CIT Screen 12/29/2017 12/17/2016  What Year? 0 points 0 points  What month? 0 points 0 points  What time? 0 points 0 points  Count back from 20 0 points 0 points  Months in reverse 0 points 0 points  Repeat phrase 2 points 2 points  Total Score 2 2    Immunization  History  Administered Date(s) Administered  . Fluad Quad(high Dose 65+) 05/10/2019  . Influenza, High Dose Seasonal PF 04/28/2016, 04/30/2017  . Influenza,inj,Quad PF,6+ Mos 05/22/2015  . Pneumococcal Conjugate-13 07/10/2015  . Pneumococcal Polysaccharide-23 12/17/2016  . Td 06/20/2004    Qualifies for Shingles Vaccine? Yes . Due for Shingrix. Pt has been advised to call insurance company to determine out of pocket expense. Advised may also receive vaccine at local pharmacy or Health Dept. Verbalized acceptance and understanding.  Tdap: Although this vaccine is not a covered service during a Wellness Exam, does the patient still  wish to receive this vaccine today?  No . Advised may receive this vaccine at local pharmacy or Health Dept. Aware to provide a copy of the vaccination record if obtained from local pharmacy or Health Dept. Verbalized acceptance and understanding.  Flu Vaccine: Up to date  Pneumococcal Vaccine: Completed series  Screening Tests Health Maintenance  Topic Date Due  . COVID-19 Vaccine (1) Never done  . URINE MICROALBUMIN  01/21/2016  . DEXA SCAN  03/27/2018  . OPHTHALMOLOGY EXAM  06/11/2019  . FOOT EXAM  10/18/2019  . TETANUS/TDAP  01/23/2021 (Originally 06/20/2014)  . INFLUENZA VACCINE  03/10/2020  . HEMOGLOBIN A1C  05/01/2020  . PNA vac Low Risk Adult  Completed    Cancer Screenings:  Colorectal Screening: No longer required.   Mammogram: No longer required.   Bone Density: Completed 03/27/13. Results reflect OSTEOPENIA. Repeat every 5 years. Declined an order at this time.  Lung Cancer Screening: (Low Dose CT Chest recommended if Age 38-80 years, 30 pack-year currently smoking OR have quit w/in 15years.) does not qualify.   Additional Screening:  Dental Screening: Recommended annual dental exams for proper oral hygiene   Community Resource Referral:  CRR required this visit? No     Plan:  I have personally reviewed and addressed the Medicare Annual Wellness questionnaire and have noted the following in the patient's chart:  A. Medical and social history B. Use of alcohol, tobacco or illicit drugs  C. Current medications and supplements D. Functional ability and status E.  Nutritional status F.  Physical activity G. Advance directives H. List of other physicians I.  Hospitalizations, surgeries, and ER visits in previous 12 months J.  [place] such as hearing and vision if needed, cognitive and depression L. Referrals and appointments   In addition, I have reviewed and discussed with patient certain preventive protocols, quality metrics, and best  practice recommendations. A written personalized care plan for preventive services as well as general preventive health recommendations were provided to patient.   Glendora Score, [place]  12/25/6158 Nurse Health Advisor   Nurse Notes: Pt needs a urine check and diabetic foot exam at next in office apt. Son declined a DEXA order at this time. Pt has an eye exam scheduled for 06/2020.

## 2020-01-24 ENCOUNTER — Ambulatory Visit (INDEPENDENT_AMBULATORY_CARE_PROVIDER_SITE_OTHER): Payer: Medicare PPO

## 2020-01-24 ENCOUNTER — Other Ambulatory Visit: Payer: Self-pay

## 2020-01-24 DIAGNOSIS — Z0000 Encounter for general adult medical examination without abnormal findings: Secondary | ICD-10-CM

## 2020-01-24 NOTE — Patient Instructions (Signed)
[name] , Thank you for taking time to come for your Medicare Wellness Visit. I appreciate your ongoing commitment to your health goals. Please review the following plan we discussed and let me know if I can assist you in the future.   Screening recommendations/referrals: Colonoscopy: No longer required.  Mammogram: No longer required.  Bone Density: Currently due. Declined an order at this time. Recommended yearly ophthalmology/optometry visit for glaucoma screening and checkup Recommended yearly dental visit for hygiene and checkup  Vaccinations: Influenza vaccine: Up to date Pneumococcal vaccine: Completed series Tdap vaccine: Pt declines today.  Shingles vaccine: Pt declines today.     Advanced directives: Advance directive discussed with you today. Even though you declined this today please call our office should you change your mind and we can give you the proper paperwork for you to fill out.  Conditions/risks identified: Fall risk preventatives discussed today.   Next appointment: 02/07/20 @ 10:40 AM with Dr Rosanna Randy    Preventive Care 12 Years and Older, Female Preventive care refers to lifestyle choices and visits with your health care provider that can promote health and wellness. What does preventive care include?  A yearly physical exam. This is also called an annual well check.  Dental exams once or twice a year.  Routine eye exams. Ask your health care provider how often you should have your eyes checked.  Personal lifestyle choices, including:  Daily care of your teeth and gums.  Regular physical activity.  Eating a healthy diet.  Avoiding tobacco and drug use.  Limiting alcohol use.  Practicing safe sex.  Taking low-dose aspirin every day.  Taking vitamin and mineral supplements as recommended by your health care provider. What happens during an annual well check? The services and screenings done by your health care provider during your annual well  check will depend on your age, overall health, lifestyle risk factors, and family history of disease. Counseling  Your health care provider may ask you questions about your:  Alcohol use.  Tobacco use.  Drug use.  Emotional well-being.  Home and relationship well-being.  Sexual activity.  Eating habits.  History of falls.  Memory and ability to understand (cognition).  Work and work Statistician.  Reproductive health. Screening  You may have the following tests or measurements:  Height, weight, and BMI.  Blood pressure.  Lipid and cholesterol levels. These may be checked every 5 years, or more frequently if you are over 64 years old.  Skin check.  Lung cancer screening. You may have this screening every year starting at age 42 if you have a 30-pack-year history of smoking and currently smoke or have quit within the past 15 years.  Fecal occult blood test (FOBT) of the stool. You may have this test every year starting at age 79.  Flexible sigmoidoscopy or colonoscopy. You may have a sigmoidoscopy every 5 years or a colonoscopy every 10 years starting at age 22.  Hepatitis C blood test.  Hepatitis B blood test.  Sexually transmitted disease (STD) testing.  Diabetes screening. This is done by checking your blood sugar (glucose) after you have not eaten for a while (fasting). You may have this done every 1-3 years.  Bone density scan. This is done to screen for osteoporosis. You may have this done starting at age 40.  Mammogram. This may be done every 1-2 years. Talk to your health care provider about how often you should have regular mammograms. Talk with your health care provider about your  test results, treatment options, and if necessary, the need for more tests. Vaccines  Your health care provider may recommend certain vaccines, such as:  Influenza vaccine. This is recommended every year.  Tetanus, diphtheria, and acellular pertussis (Tdap, Td) vaccine. You  may need a Td booster every 10 years.  Zoster vaccine. You may need this after age 38.  Pneumococcal 13-valent conjugate (PCV13) vaccine. One dose is recommended after age 61.  Pneumococcal polysaccharide (PPSV23) vaccine. One dose is recommended after age 78. Talk to your health care provider about which screenings and vaccines you need and how often you need them. This information is not intended to replace advice given to you by your health care provider. Make sure you discuss any questions you have with your health care provider. Document Released: 08/23/2015 Document Revised: 04/15/2016 Document Reviewed: 05/28/2015 Elsevier Interactive Patient Education  2017 [place] Prevention in the Home Falls can cause injuries. They can happen to people of all ages. There are many things you can do to make your home safe and to help prevent falls. What can I do on the outside of my home?  Regularly fix the edges of walkways and driveways and fix any cracks.  Remove anything that might make you trip as you walk through a door, such as a raised step or threshold.  Trim any bushes or trees on the path to your home.  Use bright outdoor lighting.  Clear any walking paths of anything that might make someone trip, such as rocks or tools.  Regularly check to see if handrails are loose or broken. Make sure that both sides of any steps have handrails.  Any raised decks and porches should have guardrails on the edges.  Have any leaves, snow, or ice cleared regularly.  Use sand or salt on walking paths during winter.  Clean up any spills in your garage right away. This includes oil or grease spills. What can I do in the bathroom?  Use night lights.  Install grab bars by the toilet and in the tub and shower. Do not use towel bars as grab bars.  Use non-skid mats or decals in the tub or shower.  If you need to sit down in the shower, use a plastic, non-slip stool.  Keep the floor  dry. Clean up any water that spills on the floor as soon as it happens.  Remove soap buildup in the tub or shower regularly.  Attach bath mats securely with double-sided non-slip rug tape.  Do not have throw rugs and other things on the floor that can make you trip. What can I do in the bedroom?  Use night lights.  Make sure that you have a light by your bed that is easy to reach.  Do not use any sheets or blankets that are too big for your bed. They should not hang down onto the floor.  Have a firm chair that has side arms. You can use this for support while you get dressed.  Do not have throw rugs and other things on the floor that can make you trip. What can I do in the kitchen?  Clean up any spills right away.  Avoid walking on wet floors.  Keep items that you use a lot in easy-to-reach places.  If you need to reach something above you, use a strong step stool that has a grab bar.  Keep electrical cords out of the way.  Do not use floor polish or wax that  makes floors slippery. If you must use wax, use non-skid floor wax.  Do not have throw rugs and other things on the floor that can make you trip. What can I do with my stairs?  Do not leave any items on the stairs.  Make sure that there are handrails on both sides of the stairs and use them. Fix handrails that are broken or loose. Make sure that handrails are as long as the stairways.  Check any carpeting to make sure that it is firmly attached to the stairs. Fix any carpet that is loose or worn.  Avoid having throw rugs at the top or bottom of the stairs. If you do have throw rugs, attach them to the floor with carpet tape.  Make sure that you have a light switch at the top of the stairs and the bottom of the stairs. If you do not have them, ask someone to add them for you. What else can I do to help prevent falls?  Wear shoes that:  Do not have high heels.  Have rubber bottoms.  Are comfortable and fit you  well.  Are closed at the toe. Do not wear sandals.  If you use a stepladder:  Make sure that it is fully opened. Do not climb a closed stepladder.  Make sure that both sides of the stepladder are locked into place.  Ask someone to hold it for you, if possible.  Clearly mark and make sure that you can see:  Any grab bars or handrails.  First and last steps.  Where the edge of each step is.  Use tools that help you move around (mobility aids) if they are needed. These include:  Canes.  Walkers.  Scooters.  Crutches.  Turn on the lights when you go into a dark area. Replace any light bulbs as soon as they burn out.  Set up your furniture so you have a clear path. Avoid moving your furniture around.  If any of your floors are uneven, fix them.  If there are any pets around you, be aware of where they are.  Review your medicines with your doctor. Some medicines can make you feel dizzy. This can increase your chance of falling. Ask your doctor what other things that you can do to help prevent falls. This information is not intended to replace advice given to you by your health care provider. Make sure you discuss any questions you have with your health care provider. Document Released: 05/23/2009 Document Revised: 01/02/2016 Document Reviewed: 08/31/2014 Elsevier Interactive Patient Education  2017 [employer].

## 2020-01-29 ENCOUNTER — Other Ambulatory Visit: Payer: Self-pay | Admitting: Family Medicine

## 2020-01-29 DIAGNOSIS — E039 Hypothyroidism, unspecified: Secondary | ICD-10-CM

## 2020-01-29 NOTE — Telephone Encounter (Signed)
Requested Prescriptions  Pending Prescriptions Disp Refills  . SYNTHROID 50 MCG tablet [Pharmacy Med Name: SYNTHROID 50 MCG TAB] 90 tablet 3    Sig: TAKE 1 TABLET BY MOUTH DAILY BEFORE BREAKFAST     Endocrinology:  Hypothyroid Agents Failed - 01/29/2020 11:59 AM      Failed - TSH needs to be rechecked within 3 months after an abnormal result. Refill until TSH is due.      Passed - TSH in normal range and within 360 days    TSH  Date Value Ref Range Status  10/30/2019 1.260 0.450 - 4.500 uIU/mL Final         Passed - Valid encounter within last 12 months    Recent Outpatient Visits          1 month ago Type 2 diabetes mellitus with diabetic nephropathy, without long-term current use of insulin [facility])   [facility] Jerrol Banana., MD   3 months ago Type 2 diabetes mellitus with diabetic nephropathy, without long-term current use of insulin [facility])   [facility] Jerrol Banana., MD   8 months ago Atrial fibrillation, unspecified type [facility])   [facility] Jerrol Banana., MD   1 year ago Type 2 diabetes mellitus with diabetic nephropathy, without long-term current use of insulin [facility])   [facility] Jerrol Banana., MD   1 year ago Pre-ulcerative corn or callous   [facility] Birdie Sons, MD      Future Appointments            In 1 week Jerrol Banana., MD [facility], [place]   In 1 month Jerrol Banana., MD [facility]

## 2020-02-05 NOTE — Progress Notes (Signed)
Established patient visit  I,Dawn Burnett,acting as a scribe for [name], MD.,have documented all relevant documentation on the behalf of [name], MD,as directed by  [name], MD while in the presence of [name], MD.   Patient: Dawn Burnett   DOB: 21-Oct-1928   86 y.o. Female  MRN: [id number] Visit Date: 02/07/2020  Today's healthcare provider: [name], MD   Chief Complaint  Patient presents with  . Diabetes  . Follow-up  . Hypertension   Subjective    HPI  Patient does complain of chronic abdominal discomfort, especially epigastric.  She has a history of a stromal tumor/GIST.  She is also now taking Celebrex and feels much better with her arthritic pain according to the patient. We have discussed at length the last few visits the risk of Celebrex and the anticoagulant   Diabetes Mellitus Type II, follow-up  Lab Results  Component Value Date   HGBA1C 7.2 (A) 02/07/2020   HGBA1C 10.8 (H) 10/30/2019   HGBA1C 7.9 (A) 05/10/2019   Last seen for diabetes 2 months ago.  Management since then includes; Patient wishes to work on just her eating habits.  We will have her cut back on carbs.  We went over this in detail. She reports good compliance with treatment. She is not having side effects. none  Home blood sugar records: fasting range: not checking  Episodes of hypoglycemia? No n/a   Current insulin regiment: n/a Most Recent Eye Exam: due  --------------------------------------------------------------------  Hypertension, follow-up  BP Readings from Last 3 Encounters:  02/07/20 (!) 150/76  12/05/19 (!) 148/66  10/30/19 (!) 154/80   Wt Readings from Last 3 Encounters:  02/07/20 148 lb (67.1 kg)  12/05/19 152 lb (68.9 kg)  05/10/19 176 lb (79.8 kg)     She was last seen for hypertension 3 months ago.  BP at that visit was 154/80. Management since that visit includes; Fair control.  I think this is good for  this 86 year old. She reports good compliance with treatment. She is not having side effects. none She is not exercising. She is adherent to low salt diet.   Outside blood pressures are none.  She does not smoke.  Use of agents associated with hypertension: none.   --------------------------------------------------------------------   Primary osteoarthritis of both knees From 12/05/2019-After lengthy conversation with the patient and her daughter I am reluctantly given her back to Celebrex.  I explained her that she could easily bleed to death from a GI bleed on the Eliquis.  She is willing to accept this risk because the Celebrex really helps her pain she says.  100 mg dose was given.  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  Overall other than her pain she feels fairly well.  As I discussed with the patient and her daughter I am very reluctant to put her through full work-up and 86 years old and frail health.  She has had both Covid vaccines.       Medications: Outpatient Medications Prior to Visit  Medication Sig  . acetaminophen (TYLENOL) 500 MG tablet Take 500 mg by mouth every 6 (six) hours as needed for mild pain, moderate pain, fever or headache.  Marland Kitchen atorvastatin (LIPITOR) 20 MG tablet Take 20 mg by mouth at bedtime.   . blood glucose meter kit and supplies Dispense based on patient and insurance preference. Use once daily  as directed. (FOR ICD-10 E11.21).  . Calcium-Vitamin D 600-200 MG-UNIT per tablet Take 600 tablets by mouth daily with supper.   . celecoxib (CELEBREX) 100 MG capsule Take 2 capsules (200 mg total) by mouth daily as needed. (Patient taking differently: Take 200 mg by mouth daily as needed. Taking 1 capsule BID)  . COUMADIN 1 MG tablet TAKE 1 (ONE) TABLET TABLET, ORAL, DAILY AS DIRECTED  . COUMADIN 4 MG tablet TAKE 1 TABLET AS DIRECTED  . digoxin (LANOXIN) 0.125 MG tablet  Take 0.125 mg by mouth every other day.   . diltiazem (CARDIZEM CD) 120 MG 24 hr capsule Take 120 mg by mouth daily.  Marland Kitchen ELIQUIS 2.5 MG TABS tablet Take 2.5 mg by mouth 2 (two) times daily.   Marland Kitchen ENTRESTO 97-103 MG Take 1 tablet by mouth 2 (two) times daily.   . furosemide (LASIX) 20 MG tablet Take 20 mg by mouth daily.   Marland Kitchen gabapentin (NEURONTIN) 600 MG tablet TAKE ONE TABLET BY MOUTH THREE TIMES A DAY  . imipramine (TOFRANIL) 25 MG tablet TAKE 1 TABLET BY MOUTH AT BEDTIME  . loperamide (IMODIUM) 2 MG capsule Take 2 mg by mouth as needed.   . metFORMIN (GLUCOPHAGE) 500 MG tablet TAKE ONE TABLET TWICE DAILY WITH A MEAL  . metoprolol tartrate (LOPRESSOR) 100 MG tablet Take 1 tablet (100 mg total) by mouth 2 (two) times daily.  . Multiple Vitamins-Minerals (CENTRUM SILVER ULTRA WOMENS PO) Take 1 tablet by mouth daily with lunch.   Marland Kitchen MYRBETRIQ 50 MG TB24 tablet TAKE 1 TABLET BY MOUTH DAILY  . NYAMYC powder APPLY TOPICALLY FOUR TIMES A DAY (Patient taking differently: As needed)  . omeprazole (PRILOSEC) 20 MG capsule Take 1 capsule (20 mg total) by mouth daily.  . Probiotic Product (ALIGN) 4 MG CAPS Take by mouth every other day.  Marland Kitchen SYNTHROID 50 MCG tablet TAKE 1 TABLET BY MOUTH DAILY BEFORE BREAKFAST  . cephALEXin (KEFLEX) 500 MG capsule Take 1 capsule (500 mg total) by mouth 2 (two) times daily. (Patient not taking: Reported on 02/07/2020)  . cephALEXin (KEFLEX) 500 MG capsule Take 1 capsule (500 mg total) by mouth 3 (three) times daily. (Patient not taking: Reported on 01/24/2020)  . diphenoxylate-atropine (LOMOTIL) 2.5-0.025 MG tablet Take 1 tablet by mouth daily as needed for diarrhea or loose stools. (Patient not taking: Reported on 01/24/2020)  . Methylcellulose, Laxative, (CITRUCEL) 500 MG TABS Take 2 tablets by mouth daily. (Patient not taking: Reported on 02/07/2020)  . mupirocin cream (BACTROBAN) 2 % Apply 1 application topically daily. (Patient not taking: Reported on 01/24/2020)  . nystatin  cream (MYCOSTATIN) APPLY  TOPICALLY 2 (TWO) TIMES DAILY. APPLY TO AFFECTED AREA (Patient not taking: Reported on 01/24/2020)  . ranitidine (ZANTAC) 150 MG tablet TAKE 1 TABLET BY MOUTH TWO TIMES DAILY (Patient not taking: Reported on 01/24/2020)  . traMADol (ULTRAM) 50 MG tablet Take 1 tablet (50 mg total) by mouth every 6 (six) hours as needed. (Patient not taking: Reported on 02/07/2020)  . UNABLE TO FIND APPLY TO THE AFFECTED AREA(S) TWICE DAILY ON RIGHT CHEEK FOR 10 DAYS (Patient not taking: Reported on 02/07/2020)  . warfarin (COUMADIN) 1 MG tablet Take 1 tablet (1 mg total) by mouth daily. (Patient not taking: Reported on 02/07/2020)  . warfarin (COUMADIN) 3 MG tablet Take 1 tablet (3 mg total) by mouth daily. (Patient not taking: Reported on 02/07/2020)   No facility-administered medications prior to visit.    Review of Systems  Constitutional: Negative  for appetite change, chills, fatigue and fever.  HENT: Negative.   Eyes: Negative.   Respiratory: Negative for chest tightness and shortness of breath.   Cardiovascular: Negative for chest pain and palpitations.  Gastrointestinal: Negative for abdominal pain, nausea and vomiting.  Endocrine: Negative.   Musculoskeletal: Positive for arthralgias.  Allergic/Immunologic: Negative.   Neurological: Negative for dizziness and weakness.  Hematological: Negative.   Psychiatric/Behavioral: Negative.        Objective    BP (!) 150/76 (BP Location: Right Arm, Patient Position: Sitting, Cuff Size: Large)   Pulse 75   Temp (!) 96.9 F (36.1 C) (Other (Comment))   Resp 18   Ht 5' 7" (1.702 m)   Wt 148 lb (67.1 kg)   SpO2 97%   BMI 23.18 kg/m  BP Readings from Last 3 Encounters:  02/07/20 (!) 150/76  12/05/19 (!) 148/66  10/30/19 (!) 154/80   Wt Readings from Last 3 Encounters:  02/07/20 148 lb (67.1 kg)  12/05/19 152 lb (68.9 kg)  05/10/19 176 lb (79.8 kg)      Physical Exam Vitals and nursing note reviewed.  Constitutional:       Appearance: Normal appearance. She is normal weight.  HENT:     Head: Normocephalic and atraumatic.     Right Ear: External ear normal.     Left Ear: External ear normal.  Eyes:     General: No scleral icterus.    Conjunctiva/sclera: Conjunctivae normal.  Cardiovascular:     Rate and Rhythm: Normal rate and regular rhythm.     Pulses: Normal pulses.     Heart sounds: Normal heart sounds.  Pulmonary:     Effort: Pulmonary effort is normal.     Breath sounds: Normal breath sounds.  Abdominal:     General: Bowel sounds are normal. There is no distension.     Palpations: Abdomen is soft. There is no mass.     Tenderness: There is no abdominal tenderness.  Musculoskeletal:        General: Normal range of motion.     Cervical back: Normal range of motion and neck supple.     Right lower leg: Edema present.     Left lower leg: Edema present.  Skin:    General: Skin is warm and dry.     Capillary Refill: Capillary refill takes 2 to 3 seconds.  Neurological:     General: No focal deficit present.     Mental Status: She is alert and oriented to person, place, and time.  Psychiatric:        Mood and Affect: Mood normal.        Behavior: Behavior normal.        Thought Content: Thought content normal.        Judgment: Judgment normal.       Results for orders placed or performed in visit on 02/07/20  POCT glycosylated hemoglobin (Hb A1C)  Result Value Ref Range   Hemoglobin A1C 7.2 (A) 4.0 - 5.6 %   Est. average glucose Bld gHb Est-mCnc 160     Assessment & Plan     1. Type 2 diabetes mellitus with diabetic nephropathy, without long-term current use of insulin (HCC) Hemoglobin A1c is down from 10.8 at last visit to 7.2 today. - POCT glycosylated hemoglobin (Hb A1C)  2. Essential (primary) hypertension   3. Primary osteoarthritis of both knees  - celecoxib (CELEBREX) 100 MG capsule; Take 1 capsule (100 mg total) by mouth daily as  needed.  Dispense: 90 capsule; Refill:  1  4. Bilateral hearing loss, unspecified hearing loss type Family would like to explore if Medicare will help cover the hearing aids.  CCM consult for assessing the situation. - Ambulatory referral to Chronic Care Management Services  5. Atrial fibrillation, controlled ([place]) Lifelong anticoagulant  6. Arteriosclerosis of coronary artery All risk factors treated  7. 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  Follow clinically.  Patient nor daughter are enthusiastic about thorough work-up at 86 years old.  8. Gastroesophageal reflux disease without esophagitis Lifelong omeprazole to protect her from the Celebrex.  9. Neuropathy Chronic issue. 10.  Unintentional weight loss No work-up presently.   See#7 No follow-ups on file.         Richard Cranford Mon, MD  [facility] [phone] (phone) [phone] (fax)  [place]

## 2020-02-07 ENCOUNTER — Ambulatory Visit: Payer: Medicare PPO | Admitting: Family Medicine

## 2020-02-07 ENCOUNTER — Other Ambulatory Visit: Payer: Self-pay

## 2020-02-07 ENCOUNTER — Encounter: Payer: Self-pay | Admitting: Family Medicine

## 2020-02-07 VITALS — BP 150/76 | HR 75 | Temp 96.9°F | Resp 18 | Ht 67.0 in | Wt 148.0 lb

## 2020-02-07 DIAGNOSIS — I10 Essential (primary) hypertension: Secondary | ICD-10-CM | POA: Diagnosis not present

## 2020-02-07 DIAGNOSIS — G629 Polyneuropathy, unspecified: Secondary | ICD-10-CM

## 2020-02-07 DIAGNOSIS — I2510 Atherosclerotic heart disease of native coronary artery without angina pectoris: Secondary | ICD-10-CM

## 2020-02-07 DIAGNOSIS — E1121 Type 2 diabetes mellitus with diabetic nephropathy: Secondary | ICD-10-CM

## 2020-02-07 DIAGNOSIS — I4891 Unspecified atrial fibrillation: Secondary | ICD-10-CM

## 2020-02-07 DIAGNOSIS — C49A2 Gastrointestinal stromal tumor of stomach: Secondary | ICD-10-CM

## 2020-02-07 DIAGNOSIS — M170 Bilateral primary osteoarthritis of knee: Secondary | ICD-10-CM | POA: Diagnosis not present

## 2020-02-07 DIAGNOSIS — H9193 Unspecified hearing loss, bilateral: Secondary | ICD-10-CM

## 2020-02-07 DIAGNOSIS — R634 Abnormal weight loss: Secondary | ICD-10-CM

## 2020-02-07 DIAGNOSIS — K219 Gastro-esophageal reflux disease without esophagitis: Secondary | ICD-10-CM

## 2020-02-07 LAB — POCT GLYCOSYLATED HEMOGLOBIN (HGB A1C)
Est. average glucose Bld gHb Est-mCnc: 160
Hemoglobin A1C: 7.2 % — AB (ref 4.0–5.6)

## 2020-02-07 MED ORDER — CELECOXIB 100 MG PO CAPS: 100.0000 mg | ORAL_CAPSULE | Freq: Every day | ORAL | 1 refills | Status: DC | PRN

## 2020-02-08 ENCOUNTER — Encounter: Payer: Self-pay | Admitting: Family Medicine

## 2020-02-13 ENCOUNTER — Other Ambulatory Visit: Payer: Self-pay | Admitting: Family Medicine

## 2020-02-19 ENCOUNTER — Encounter: Payer: Self-pay | Admitting: Family Medicine

## 2020-02-19 ENCOUNTER — Other Ambulatory Visit: Payer: Self-pay | Admitting: Family Medicine

## 2020-02-19 DIAGNOSIS — E1121 Type 2 diabetes mellitus with diabetic nephropathy: Secondary | ICD-10-CM

## 2020-02-19 DIAGNOSIS — G629 Polyneuropathy, unspecified: Secondary | ICD-10-CM

## 2020-02-19 DIAGNOSIS — H9193 Unspecified hearing loss, bilateral: Secondary | ICD-10-CM

## 2020-02-19 DIAGNOSIS — R350 Frequency of micturition: Secondary | ICD-10-CM

## 2020-02-19 NOTE — Telephone Encounter (Signed)
Requested Prescriptions  Pending Prescriptions Disp Refills  . imipramine (TOFRANIL) 25 MG tablet [Pharmacy Med Name: IMIPRAMINE HCL 25 MG TAB] 90 tablet 0    Sig: TAKE 1 TABLET BY MOUTH AT BEDTIME     Psychiatry:  Antidepressants - Heterocyclics (TCAs) Passed - 02/19/2020  9:32 AM      Passed - Valid encounter within last 6 months    Recent Outpatient Visits          1 week ago Type 2 diabetes mellitus with diabetic nephropathy, without long-term current use of insulin [facility])   [facility] Jerrol Banana., MD   2 months ago Type 2 diabetes mellitus with diabetic nephropathy, without long-term current use of insulin [facility])   [facility] Jerrol Banana., MD   3 months ago Type 2 diabetes mellitus with diabetic nephropathy, without long-term current use of insulin [facility])   [facility] Jerrol Banana., MD   9 months ago Atrial fibrillation, unspecified type [facility])   [facility] Jerrol Banana., MD   1 year ago Type 2 diabetes mellitus with diabetic nephropathy, without long-term current use of insulin [facility])   [facility] Jerrol Banana., MD      Future Appointments            In 2 months Jerrol Banana., MD [facility]

## 2020-02-20 NOTE — Telephone Encounter (Signed)
CCM consult is pending. However if they want to go ahead and be referred to ENT/audiology I am fine with that for hearing loss

## 2020-02-27 ENCOUNTER — Telehealth: Payer: Self-pay

## 2020-02-27 DIAGNOSIS — H9190 Unspecified hearing loss, unspecified ear: Secondary | ICD-10-CM

## 2020-02-27 NOTE — Telephone Encounter (Signed)
Referral placed to ENT

## 2020-02-29 ENCOUNTER — Ambulatory Visit: Payer: Self-pay | Admitting: Family Medicine

## 2020-03-13 NOTE — Telephone Encounter (Signed)
Per Parke Poisson, ENT office has tried to contact pt twice. Sarah scheduled appt and notified pt's daughter [name].

## 2020-03-13 NOTE — Telephone Encounter (Signed)
Checking with referral coordinator on this.

## 2020-03-15 ENCOUNTER — Telehealth: Payer: Self-pay

## 2020-03-15 NOTE — Telephone Encounter (Signed)
03/15/2020 Spoke with patient to let her know I am working on her referral for hearing aid assistance and would call her back next week.  She requested that I call her son. I have her son's numbe and will call him. Ambrose Mantle [phone]

## 2020-03-19 ENCOUNTER — Telehealth: Payer: Self-pay

## 2020-03-19 NOTE — Telephone Encounter (Signed)
03/19/20 Spoke with patient's son Kasi Lasky about the [place] Division of Services for the Deaf and Hard of Hearing.  They require the patient have a hearing evaluation first and then they can file an application for eligibility.  Patient has a hearing exam scheduled with Dr. Kathyrn Sheriff in a few weeks. Emailed information to son per request. Ambrose Mantle [phone]

## 2020-03-25 NOTE — Telephone Encounter (Signed)
03/25/20 Spoke with patient's son Delenn Ahn he received the information about the [facility] and plans on calling them to complete an application after his mother has her hearing exam.  No other resources are needed at this time. Closing referral. Ambrose Mantle [phone]

## 2020-04-01 DIAGNOSIS — H903 Sensorineural hearing loss, bilateral: Secondary | ICD-10-CM | POA: Diagnosis not present

## 2020-04-01 DIAGNOSIS — H6123 Impacted cerumen, bilateral: Secondary | ICD-10-CM | POA: Diagnosis not present

## 2020-04-19 DIAGNOSIS — H903 Sensorineural hearing loss, bilateral: Secondary | ICD-10-CM | POA: Diagnosis not present

## 2020-04-22 ENCOUNTER — Other Ambulatory Visit: Payer: Self-pay | Admitting: Family Medicine

## 2020-04-22 DIAGNOSIS — E1121 Type 2 diabetes mellitus with diabetic nephropathy: Secondary | ICD-10-CM

## 2020-05-02 DIAGNOSIS — R609 Edema, unspecified: Secondary | ICD-10-CM | POA: Diagnosis not present

## 2020-05-02 DIAGNOSIS — E782 Mixed hyperlipidemia: Secondary | ICD-10-CM | POA: Diagnosis not present

## 2020-05-02 DIAGNOSIS — I10 Essential (primary) hypertension: Secondary | ICD-10-CM | POA: Diagnosis not present

## 2020-05-02 DIAGNOSIS — I509 Heart failure, unspecified: Secondary | ICD-10-CM | POA: Diagnosis not present

## 2020-05-02 DIAGNOSIS — I4891 Unspecified atrial fibrillation: Secondary | ICD-10-CM | POA: Diagnosis not present

## 2020-05-03 DIAGNOSIS — E119 Type 2 diabetes mellitus without complications: Secondary | ICD-10-CM | POA: Diagnosis not present

## 2020-05-03 LAB — HM DIABETES EYE EXAM

## 2020-05-09 ENCOUNTER — Encounter: Payer: Self-pay | Admitting: Family Medicine

## 2020-05-09 ENCOUNTER — Other Ambulatory Visit: Payer: Self-pay

## 2020-05-09 ENCOUNTER — Ambulatory Visit (INDEPENDENT_AMBULATORY_CARE_PROVIDER_SITE_OTHER): Payer: Medicare PPO | Admitting: Family Medicine

## 2020-05-09 VITALS — BP 163/79 | HR 78 | Temp 97.9°F | Ht 67.0 in | Wt 146.4 lb

## 2020-05-09 DIAGNOSIS — G629 Polyneuropathy, unspecified: Secondary | ICD-10-CM | POA: Diagnosis not present

## 2020-05-09 DIAGNOSIS — C49A2 Gastrointestinal stromal tumor of stomach: Secondary | ICD-10-CM | POA: Diagnosis not present

## 2020-05-09 DIAGNOSIS — I4891 Unspecified atrial fibrillation: Secondary | ICD-10-CM

## 2020-05-09 DIAGNOSIS — E1121 Type 2 diabetes mellitus with diabetic nephropathy: Secondary | ICD-10-CM | POA: Diagnosis not present

## 2020-05-09 DIAGNOSIS — E7849 Other hyperlipidemia: Secondary | ICD-10-CM | POA: Diagnosis not present

## 2020-05-09 DIAGNOSIS — E039 Hypothyroidism, unspecified: Secondary | ICD-10-CM

## 2020-05-09 DIAGNOSIS — Z23 Encounter for immunization: Secondary | ICD-10-CM

## 2020-05-09 DIAGNOSIS — I10 Essential (primary) hypertension: Secondary | ICD-10-CM

## 2020-05-09 DIAGNOSIS — I5042 Chronic combined systolic (congestive) and diastolic (congestive) heart failure: Secondary | ICD-10-CM | POA: Diagnosis not present

## 2020-05-09 DIAGNOSIS — N3946 Mixed incontinence: Secondary | ICD-10-CM

## 2020-05-09 LAB — POCT GLYCOSYLATED HEMOGLOBIN (HGB A1C)
Estimated Average Glucose: 151
Hemoglobin A1C: 6.9 % — AB (ref 4.0–5.6)

## 2020-05-09 NOTE — Progress Notes (Addendum)
Established patient visit   Patient: Dawn Burnett   DOB: [birth year]   86 y.o. Female  MRN: [id number] Visit Date: 05/09/2020  Today's healthcare provider: [name], MD   Chief Complaint  Patient presents with  . Diabetes  . Hypertension   Subjective    HPI  Patient has no new issues but the same chronic issues.  She mainly complains of chronic pain from musculoskeletal sources and neuropathy.  She is on Celebrex and maximum dose gabapentin although I think the Celebrex is a bad idea.  She has chronic urinary incontinence.  She states she is not sure why she has been left here so long.  She denies depression or suicidal ideation Diabetes Mellitus Type II, follow-up  Lab Results  Component Value Date   HGBA1C 7.2 (A) 02/07/2020   HGBA1C 10.8 (H) 10/30/2019   HGBA1C 7.9 (A) 05/10/2019   Last seen for diabetes 3 months ago.  Management since then includes continuing the same treatment. She reports good compliance with treatment. She is not having side effects.   Home blood sugar records: not being checked  Episodes of hypoglycemia? No    Current insulin regiment: none Most Recent Eye Exam: due  --------------------------------------------------------------------------------------------------- Hypertension, follow-up  BP Readings from Last 3 Encounters:  05/09/20 (!) 163/79  02/07/20 (!) 150/76  12/05/19 (!) 148/66   Wt Readings from Last 3 Encounters:  05/09/20 146 lb 6.4 oz (66.4 kg)  02/07/20 148 lb (67.1 kg)  12/05/19 152 lb (68.9 kg)     She was last seen for hypertension 3 months ago.  BP at that visit was 150/76. Management since that visit includes; diltiazem and metoprolol. She reports excellent compliance with treatment. She is not having side effects.  She is not exercising. She is adherent to low salt diet.   Outside blood pressures are being checked occasionally.  She does not smoke.  Use of agents associated with hypertension:  none.   ---------------------------------------------------------------------------------------------------   Social History   Tobacco Use  . Smoking status: Never Smoker  . Smokeless tobacco: Never Used  Vaping Use  . Vaping Use: Never used  Substance Use Topics  . Alcohol use: No  . Drug use: No       Medications: Outpatient Medications Prior to Visit  Medication Sig  . acetaminophen (TYLENOL) 500 MG tablet Take 500 mg by mouth every 6 (six) hours as needed for mild pain, moderate pain, fever or headache.  Marland Kitchen atorvastatin (LIPITOR) 20 MG tablet Take 20 mg by mouth at bedtime.   . blood glucose meter kit and supplies Dispense based on patient and insurance preference. Use once daily as directed. (FOR ICD-10 E11.21).  . Calcium-Vitamin D 600-200 MG-UNIT per tablet Take 600 tablets by mouth daily with supper.   . celecoxib (CELEBREX) 100 MG capsule Take 1 capsule (100 mg total) by mouth daily as needed.  Marland Kitchen COUMADIN 1 MG tablet TAKE 1 (ONE) TABLET TABLET, ORAL, DAILY AS DIRECTED  . COUMADIN 4 MG tablet TAKE 1 TABLET AS DIRECTED  . digoxin (LANOXIN) 0.125 MG tablet Take 0.125 mg by mouth every other day.   Marland Kitchen ELIQUIS 2.5 MG TABS tablet Take 2.5 mg by mouth 2 (two) times daily.   Marland Kitchen ENTRESTO 97-103 MG Take 1 tablet by mouth 2 (two) times daily.   . furosemide (LASIX) 20 MG tablet Take 20 mg by mouth daily.   Marland Kitchen gabapentin (NEURONTIN) 600 MG tablet TAKE ONE TABLET BY MOUTH 3 TIMES DAILY  .  imipramine (TOFRANIL) 25 MG tablet TAKE 1 TABLET BY MOUTH AT BEDTIME  . loperamide (IMODIUM) 2 MG capsule Take 2 mg by mouth as needed.   . metFORMIN (GLUCOPHAGE) 500 MG tablet TAKE ONE TABLET TWICE A DAY WITH MEALS  . metoprolol tartrate (LOPRESSOR) 100 MG tablet Take 1 tablet (100 mg total) by mouth 2 (two) times daily.  . Multiple Vitamins-Minerals (CENTRUM SILVER ULTRA WOMENS PO) Take 1 tablet by mouth daily with lunch.   . mupirocin cream (BACTROBAN) 2 % Apply 1 application topically daily.  Marland Kitchen  MYRBETRIQ 50 MG TB24 tablet TAKE 1 TABLET BY MOUTH DAILY  . NYAMYC powder APPLY TOPICALLY FOUR TIMES A DAY (Patient taking differently: As needed)  . omeprazole (PRILOSEC) 20 MG capsule Take 1 capsule (20 mg total) by mouth daily.  . Probiotic Product (ALIGN) 4 MG CAPS Take by mouth every other day.  Marland Kitchen SYNTHROID 50 MCG tablet TAKE 1 TABLET BY MOUTH DAILY BEFORE BREAKFAST  . diltiazem (CARDIZEM CD) 120 MG 24 hr capsule Take 120 mg by mouth daily. (Patient not taking: Reported on 05/09/2020)  . diphenoxylate-atropine (LOMOTIL) 2.5-0.025 MG tablet Take 1 tablet by mouth daily as needed for diarrhea or loose stools. (Patient not taking: Reported on 01/24/2020)  . Methylcellulose, Laxative, (CITRUCEL) 500 MG TABS Take 2 tablets by mouth daily. (Patient not taking: Reported on 02/07/2020)  . nystatin cream (MYCOSTATIN) APPLY  TOPICALLY 2 (TWO) TIMES DAILY. APPLY TO AFFECTED AREA (Patient not taking: Reported on 01/24/2020)  . ranitidine (ZANTAC) 150 MG tablet TAKE 1 TABLET BY MOUTH TWO TIMES DAILY (Patient not taking: Reported on 01/24/2020)  . traMADol (ULTRAM) 50 MG tablet Take 1 tablet (50 mg total) by mouth every 6 (six) hours as needed. (Patient not taking: Reported on 02/07/2020)  . UNABLE TO FIND APPLY TO THE AFFECTED AREA(S) TWICE DAILY ON RIGHT CHEEK FOR 10 DAYS (Patient not taking: Reported on 02/07/2020)  . [DISCONTINUED] warfarin (COUMADIN) 1 MG tablet Take 1 tablet (1 mg total) by mouth daily. (Patient not taking: Reported on 02/07/2020)  . [DISCONTINUED] warfarin (COUMADIN) 3 MG tablet Take 1 tablet (3 mg total) by mouth daily. (Patient not taking: Reported on 02/07/2020)   No facility-administered medications prior to visit.    Review of Systems  Constitutional: Negative for appetite change, chills, fatigue and fever.  Respiratory: Negative for chest tightness and shortness of breath.   Cardiovascular: Negative for chest pain and palpitations.  Gastrointestinal: Negative for abdominal pain,  nausea and vomiting.  Neurological: Negative for dizziness and weakness.    Last hemoglobin A1c Lab Results  Component Value Date   HGBA1C 6.9 (A) 05/09/2020      Objective    BP (!) 163/79 (BP Location: Left Arm, Patient Position: Sitting, Cuff Size: Normal)   Pulse 78   Temp 97.9 F (36.6 C) (Oral)   Ht _0  (1.702 m)   Wt 146 lb 6.4 oz (66.4 kg)   BMI 22.93 kg/m  BP Readings from Last 3 Encounters:  05/09/20 (!) 163/79  02/07/20 (!) 150/76  12/05/19 (!) 148/66   Wt Readings from Last 3 Encounters:  05/09/20 146 lb 6.4 oz (66.4 kg)  02/07/20 148 lb (67.1 kg)  12/05/19 152 lb (68.9 kg)      Physical Exam Vitals and nursing note reviewed.  Constitutional:      Appearance: Normal appearance. She is normal weight.  HENT:     Head: Normocephalic and atraumatic.     Right Ear: External ear normal.  Left Ear: External ear normal.  Eyes:     General: No scleral icterus.    Conjunctiva/sclera: Conjunctivae normal.  Cardiovascular:     Rate and Rhythm: Normal rate and regular rhythm.     Pulses: Normal pulses.     Heart sounds: Normal heart sounds.  Pulmonary:     Effort: Pulmonary effort is normal.     Breath sounds: Normal breath sounds.  Abdominal:     General: Bowel sounds are normal. There is no distension.     Palpations: Abdomen is soft. There is no mass.     Tenderness: There is no abdominal tenderness.  Musculoskeletal:     Cervical back: Normal range of motion and neck supple.     Right lower leg: Edema present.     Left lower leg: Edema present.     Comments: She has a nontender synovitis of the MCP of her right middle finger.  She has support hose on and so her lower extremity edema is trace with the support of  Skin:    General: Skin is warm and dry.     Capillary Refill: Capillary refill takes 2 to 3 seconds.  Neurological:     General: No focal deficit present.     Mental Status: She is alert and oriented to person, place, and time.    Psychiatric:        Mood and Affect: Mood normal.        Behavior: Behavior normal.        Thought Content: Thought content normal.        Judgment: Judgment normal.       No results found for any visits on 05/09/20.  Assessment & Plan     1. Type 2 diabetes mellitus with diabetic nephropathy, without long-term current use of insulin (HCC) 6.9 today is a good control.  No change in the Metformin. - POCT HgB A1C  2. Essential (primary) hypertension On metoprolol and diltiazem  3. Other hyperlipidemia On atorvastatin  4. Need for immunization against influenza Patient will also consider Covid booster in about a month - Flu Vaccine QUAD High Dose(Fluad)  5. Chronic combined systolic and diastolic congestive heart failure (HCC) On Lasix and digoxin  6. Atrial fibrillation, unspecified type (HCC) On Eliquis 2.5  7. Gastrointestinal stromal tumor (GIST) of stomach ([place]) Follow clinically.  She has slowly lost weight this year but at 67 and under poor health it would be hard pressed to aggressively work this up.  He is having no complaints with her GI tract.  8. Adult hypothyroidism   9. Neuropathy On gabapentin 600 mg 3 times a day Follow-up 4 months.  Again advised patient I would like to see her off of Celebrex if possible 10. Mixed stress and urge urinary incontinence Consider stopping Myrbetriq and imipramine   No follow-ups on file.      I, [name], MD, have reviewed all documentation for this visit. The documentation on 05/09/20 for the exam, diagnosis, procedures, and orders are all accurate and complete.    Richard Cranford Mon, MD  [facility] [phone] (phone) [phone] (fax)  [place]

## 2020-05-20 ENCOUNTER — Other Ambulatory Visit: Payer: Self-pay | Admitting: Family Medicine

## 2020-05-20 DIAGNOSIS — E1121 Type 2 diabetes mellitus with diabetic nephropathy: Secondary | ICD-10-CM

## 2020-05-20 DIAGNOSIS — G629 Polyneuropathy, unspecified: Secondary | ICD-10-CM

## 2020-05-20 DIAGNOSIS — R350 Frequency of micturition: Secondary | ICD-10-CM

## 2020-07-22 ENCOUNTER — Other Ambulatory Visit: Payer: Self-pay | Admitting: Family Medicine

## 2020-07-22 DIAGNOSIS — E1121 Type 2 diabetes mellitus with diabetic nephropathy: Secondary | ICD-10-CM

## 2020-07-23 DIAGNOSIS — I739 Peripheral vascular disease, unspecified: Secondary | ICD-10-CM | POA: Diagnosis not present

## 2020-07-23 DIAGNOSIS — B351 Tinea unguium: Secondary | ICD-10-CM | POA: Diagnosis not present

## 2020-07-23 DIAGNOSIS — E1142 Type 2 diabetes mellitus with diabetic polyneuropathy: Secondary | ICD-10-CM | POA: Diagnosis not present

## 2020-07-23 DIAGNOSIS — L97511 Non-pressure chronic ulcer of other part of right foot limited to breakdown of skin: Secondary | ICD-10-CM | POA: Diagnosis not present

## 2020-07-24 DIAGNOSIS — L97511 Non-pressure chronic ulcer of other part of right foot limited to breakdown of skin: Secondary | ICD-10-CM | POA: Diagnosis not present

## 2020-07-26 DIAGNOSIS — E782 Mixed hyperlipidemia: Secondary | ICD-10-CM | POA: Diagnosis not present

## 2020-07-26 DIAGNOSIS — I2510 Atherosclerotic heart disease of native coronary artery without angina pectoris: Secondary | ICD-10-CM | POA: Diagnosis not present

## 2020-07-26 DIAGNOSIS — I5022 Chronic systolic (congestive) heart failure: Secondary | ICD-10-CM | POA: Diagnosis not present

## 2020-07-26 DIAGNOSIS — R609 Edema, unspecified: Secondary | ICD-10-CM | POA: Diagnosis not present

## 2020-07-26 DIAGNOSIS — I4891 Unspecified atrial fibrillation: Secondary | ICD-10-CM | POA: Diagnosis not present

## 2020-07-26 DIAGNOSIS — I10 Essential (primary) hypertension: Secondary | ICD-10-CM | POA: Diagnosis not present

## 2020-07-26 DIAGNOSIS — I340 Nonrheumatic mitral (valve) insufficiency: Secondary | ICD-10-CM | POA: Diagnosis not present

## 2020-08-12 ENCOUNTER — Other Ambulatory Visit: Payer: Self-pay | Admitting: Family Medicine

## 2020-08-12 DIAGNOSIS — R350 Frequency of micturition: Secondary | ICD-10-CM

## 2020-08-12 DIAGNOSIS — M170 Bilateral primary osteoarthritis of knee: Secondary | ICD-10-CM

## 2020-08-12 DIAGNOSIS — G629 Polyneuropathy, unspecified: Secondary | ICD-10-CM

## 2020-08-12 DIAGNOSIS — E1121 Type 2 diabetes mellitus with diabetic nephropathy: Secondary | ICD-10-CM

## 2020-08-20 ENCOUNTER — Other Ambulatory Visit: Payer: Self-pay | Admitting: Family Medicine

## 2020-08-23 DIAGNOSIS — I5022 Chronic systolic (congestive) heart failure: Secondary | ICD-10-CM | POA: Diagnosis not present

## 2020-09-09 ENCOUNTER — Encounter: Payer: Self-pay | Admitting: Family Medicine

## 2020-09-09 ENCOUNTER — Ambulatory Visit: Payer: Medicare PPO | Admitting: Family Medicine

## 2020-09-09 ENCOUNTER — Other Ambulatory Visit: Payer: Self-pay

## 2020-09-09 VITALS — BP 166/77 | HR 62 | Temp 97.8°F | Resp 18 | Ht 67.0 in | Wt 143.0 lb

## 2020-09-09 DIAGNOSIS — C49A2 Gastrointestinal stromal tumor of stomach: Secondary | ICD-10-CM

## 2020-09-09 DIAGNOSIS — E1121 Type 2 diabetes mellitus with diabetic nephropathy: Secondary | ICD-10-CM

## 2020-09-09 DIAGNOSIS — E7849 Other hyperlipidemia: Secondary | ICD-10-CM

## 2020-09-09 DIAGNOSIS — I5042 Chronic combined systolic (congestive) and diastolic (congestive) heart failure: Secondary | ICD-10-CM

## 2020-09-09 DIAGNOSIS — I4891 Unspecified atrial fibrillation: Secondary | ICD-10-CM | POA: Diagnosis not present

## 2020-09-09 DIAGNOSIS — I10 Essential (primary) hypertension: Secondary | ICD-10-CM | POA: Diagnosis not present

## 2020-09-09 DIAGNOSIS — G629 Polyneuropathy, unspecified: Secondary | ICD-10-CM

## 2020-09-09 LAB — POCT GLYCOSYLATED HEMOGLOBIN (HGB A1C)
Est. average glucose Bld gHb Est-mCnc: 146
Hemoglobin A1C: 6.7 % — AB (ref 4.0–5.6)

## 2020-09-09 NOTE — Patient Instructions (Signed)
Stop Metformin. Try topical Lidoderm patches at night for right foot.

## 2020-09-09 NOTE — Progress Notes (Signed)
I,[name],acting as a scribe for [name], MD.,have documented all relevant documentation on the behalf of [name], MD,as directed by  [name], MD while in the presence of [name], MD.   Established patient visit   Patient: Dawn Burnett   DOB: 12/19/1928   87 y.o. Female  MRN: [id number] Visit Date: 09/09/2020  Today's healthcare provider: [name], MD   Chief Complaint  Patient presents with  . Follow-up  . Hypertension  . Diabetes  . Hyperlipidemia   Subjective    HPI  Patient comes in today for follow-up.  She is alert and oriented.  During the interview she says "I do not know why I am still here."  She is tired and seems to be in chronic discomfort due to arthritis and neuropathy.  She is not suicidal.  Denies actually have been depressed. Diabetes Mellitus Type II, follow-up  Lab Results  Component Value Date   HGBA1C 6.7 (A) 09/09/2020   HGBA1C 6.9 (A) 05/09/2020   HGBA1C 7.2 (A) 02/07/2020   Last seen for diabetes 4 months ago.  Management since then includes continuing the same treatment. She reports good compliance with treatment. She is not having side effects. none  Home blood sugar records: fasting range: checks occasionally  Episodes of hypoglycemia? No none   Current insulin regiment: n/a Most Recent Eye Exam: 05/03/2020  --------------------------------------------------------------------  Hypertension, follow-up  BP Readings from Last 3 Encounters:  09/09/20 (!) 166/77  05/09/20 (!) 163/79  02/07/20 (!) 150/76   Wt Readings from Last 3 Encounters:  09/09/20 143 lb (64.9 kg)  05/09/20 146 lb 6.4 oz (66.4 kg)  02/07/20 148 lb (67.1 kg)     She was last seen for hypertension 4 months ago.  BP at that visit was 163/79. Management since that visit includes; On metoprolol and diltiazem. She reports good compliance with treatment. She is not having side effects. none She is not  exercising. She is adherent to low salt diet.   Outside blood pressures are checks occasionally.  She does not smoke.  Use of agents associated with hypertension: none.   --------------------------------------------------------------------  Lipid/Cholesterol, follow-up  Last Lipid Panel: Lab Results  Component Value Date   CHOL 174 12/14/2016   LDLCALC 77 12/14/2016   HDL 44 12/14/2016   TRIG 263 (H) 12/14/2016    She was last seen for this 12/14/2016.  Management since that visit includes; On atorvastatin.  She reports good compliance with treatment. She is not having side effects. none She is following a Regular, Low Sodium diet. Current exercise: none  Last metabolic panel Lab Results  Component Value Date   GLUCOSE 450 (H) 10/30/2019   NA 140 10/30/2019   K 3.6 10/30/2019   BUN 12 10/30/2019   CREATININE 0.98 10/30/2019   GFRNONAA 52 (L) 10/30/2019   GFRAA 60 10/30/2019   CALCIUM 9.3 10/30/2019   AST 16 10/30/2019   ALT 10 10/30/2019   The ASCVD Risk score Mikey Bussing DC Jr., et al., 2013) failed to calculate for the following reasons:   The 2013 ASCVD risk score is only valid for ages 49 to 41  --------------------------------------------------------------------  Chronic combined systolic and diastolic congestive heart failure ([place]) From 05/09/2020-On Lasix and digoxin.  Atrial fibrillation, unspecified type ([place]) From 05/09/2020-On Eliquis 2.5.  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  She has slowly lost weight this year but at 89 and under poor health  it would be hard pressed to aggressively work this up.  He is having no complaints with her GI tract.  Neuropathy From 05/09/2020-On gabapentin 600 mg 3 times a day. Follow-up 4 months.  Again advised patient I would like to see her off of Celebrex if possible.  Mixed stress and urge urinary incontinence From 05/09/2020-Consider stopping Myrbetriq and  imipramine.        Medications: Outpatient Medications Prior to Visit  Medication Sig  . acetaminophen (TYLENOL) 500 MG tablet Take 500 mg by mouth every 6 (six) hours as needed for mild pain, moderate pain, fever or headache.  Marland Kitchen atorvastatin (LIPITOR) 20 MG tablet Take 20 mg by mouth at bedtime.   . blood glucose meter kit and supplies Dispense based on patient and insurance preference. Use once daily as directed. (FOR ICD-10 E11.21).  . Calcium-Vitamin D 600-200 MG-UNIT per tablet Take 600 tablets by mouth daily with supper.   . celecoxib (CELEBREX) 100 MG capsule TAKE 2 CAPSULES BY MOUTH DAILY AS NEEDED  . digoxin (LANOXIN) 0.125 MG tablet Take 0.125 mg by mouth every other day.   Marland Kitchen ELIQUIS 2.5 MG TABS tablet Take 2.5 mg by mouth 2 (two) times daily.   Marland Kitchen ENTRESTO 97-103 MG Take 1 tablet by mouth 2 (two) times daily.   . furosemide (LASIX) 20 MG tablet Take 20 mg by mouth daily.   Marland Kitchen gabapentin (NEURONTIN) 600 MG tablet TAKE ONE TABLET BY MOUTH 3 TIMES DAILY  . imipramine (TOFRANIL) 25 MG tablet TAKE 1 TABLET BY MOUTH AT BEDTIME  . loperamide (IMODIUM) 2 MG capsule Take 2 mg by mouth as needed.   . metFORMIN (GLUCOPHAGE) 500 MG tablet TAKE ONE TABLET TWICE A DAY WITH MEALS  . metoprolol succinate (TOPROL-XL) 100 MG 24 hr tablet Take 100 mg by mouth daily.  . Multiple Vitamins-Minerals (CENTRUM SILVER ULTRA WOMENS PO) Take 1 tablet by mouth daily with lunch.   . mupirocin cream (BACTROBAN) 2 % Apply 1 application topically daily.  Marland Kitchen MYRBETRIQ 50 MG TB24 tablet TAKE 1 TABLET BY MOUTH DAILY  . NYAMYC powder APPLY TOPICALLY FOUR TIMES A DAY (Patient taking differently: As needed)  . omeprazole (PRILOSEC) 20 MG capsule TAKE 1 CAPSULE BY MOUTH DAILY  . Probiotic Product (ALIGN) 4 MG CAPS Take by mouth every other day.  . spironolactone (ALDACTONE) 25 MG tablet Take 25 mg by mouth daily.  Marland Kitchen SYNTHROID 50 MCG tablet TAKE 1 TABLET BY MOUTH DAILY BEFORE BREAKFAST  . COUMADIN 1 MG tablet TAKE 1  (ONE) TABLET TABLET, ORAL, DAILY AS DIRECTED (Patient not taking: Reported on 09/09/2020)  . COUMADIN 4 MG tablet TAKE 1 TABLET AS DIRECTED (Patient not taking: Reported on 09/09/2020)  . diltiazem (CARDIZEM CD) 120 MG 24 hr capsule Take 120 mg by mouth daily. (Patient not taking: No sig reported)  . diphenoxylate-atropine (LOMOTIL) 2.5-0.025 MG tablet Take 1 tablet by mouth daily as needed for diarrhea or loose stools. (Patient not taking: No sig reported)  . Methylcellulose, Laxative, 500 MG TABS Take 2 tablets by mouth daily. (Patient not taking: No sig reported)  . nystatin cream (MYCOSTATIN) APPLY  TOPICALLY 2 (TWO) TIMES DAILY. APPLY TO AFFECTED AREA (Patient not taking: No sig reported)  . ranitidine (ZANTAC) 150 MG tablet TAKE 1 TABLET BY MOUTH TWO TIMES DAILY (Patient not taking: No sig reported)  . traMADol (ULTRAM) 50 MG tablet Take 1 tablet (50 mg total) by mouth every 6 (six) hours as needed. (Patient not taking: No sig reported)  .  UNABLE TO FIND APPLY TO THE AFFECTED AREA(S) TWICE DAILY ON RIGHT CHEEK FOR 10 DAYS (Patient not taking: No sig reported)  . [DISCONTINUED] metoprolol tartrate (LOPRESSOR) 100 MG tablet Take 1 tablet (100 mg total) by mouth 2 (two) times daily.   No facility-administered medications prior to visit.    Review of Systems  Constitutional: Negative for appetite change, chills, fatigue and fever.  Respiratory: Negative for chest tightness and shortness of breath.   Cardiovascular: Negative for chest pain and palpitations.  Gastrointestinal: Negative for abdominal pain, nausea and vomiting.  Neurological: Negative for dizziness and weakness.    Last hemoglobin A1c Lab Results  Component Value Date   HGBA1C 6.7 (A) 09/09/2020       Objective    BP (!) 166/77 (BP Location: Right Arm, Patient Position: Sitting, Cuff Size: Normal)   Pulse 62   Temp 97.8 F (36.6 C) (Oral)   Resp 18   Ht 5\' 7"  (1.702 m)   Wt 143 lb (64.9 kg)   SpO2 97%   BMI 22.40  kg/m  BP Readings from Last 3 Encounters:  09/09/20 (!) 166/77  05/09/20 (!) 163/79  02/07/20 (!) 150/76   Wt Readings from Last 3 Encounters:  09/09/20 143 lb (64.9 kg)  05/09/20 146 lb 6.4 oz (66.4 kg)  02/07/20 148 lb (67.1 kg)       Physical Exam Vitals and nursing note reviewed.  Constitutional:      Appearance: Normal appearance. She is normal weight.     Comments: Elderly white female in no acute distress  HENT:     Head: Normocephalic and atraumatic.     Right Ear: External ear normal.     Left Ear: External ear normal.  Eyes:     General: No scleral icterus.    Conjunctiva/sclera: Conjunctivae normal.  Cardiovascular:     Rate and Rhythm: Normal rate and regular rhythm.     Pulses: Normal pulses.     Heart sounds: Normal heart sounds.  Pulmonary:     Effort: Pulmonary effort is normal.     Breath sounds: Normal breath sounds.  Abdominal:     General: Bowel sounds are normal. There is no distension.     Palpations: Abdomen is soft. There is no mass.     Tenderness: There is no abdominal tenderness.  Musculoskeletal:     Cervical back: Normal range of motion and neck supple.     Right lower leg: Edema present.     Left lower leg: Edema present.     Comments: There are no sores on her feet.  Her feet are cool but not cold to the touch. pulses are palpable but weak for posterior tibial and the dorsalis pedis bilaterally.  Toes are erythematous but there are no sores on the feet.  Skin:    General: Skin is warm and dry.     Capillary Refill: Capillary refill takes 2 to 3 seconds.  Neurological:     General: No focal deficit present.     Mental Status: She is alert and oriented to person, place, and time.  Psychiatric:        Mood and Affect: Mood normal.        Behavior: Behavior normal.        Thought Content: Thought content normal.        Judgment: Judgment normal.       Results for orders placed or performed in visit on 09/09/20  POCT glycosylated  hemoglobin (Hb  A1C)  Result Value Ref Range   Hemoglobin A1C 6.7 (A) 4.0 - 5.6 %   Est. average glucose Bld gHb Est-mCnc 146     Assessment & Plan     1. Type 2 diabetes mellitus with diabetic nephropathy, without long-term current use of insulin (HCC) A1c under fairly good control at 6.7.  Due to potential side effects of diarrhea at this time will stop Metformin.  A1c in the spring - POCT glycosylated hemoglobin (Hb A1C)  2. Essential (primary) hypertension Fair control.  Patient on Lopressor but probably need to add an ARB if her blood pressure does not come down.  3. Atrial fibrillation, controlled ([place]) On Eliquis.  4. Chronic combined systolic and diastolic congestive heart failure ([place])   5. 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 Neuropathy This is the patient's main complaint today is 1 of right foot pain at night.  I think this is neuropathy versus PAD.  There is no claudication.  Podiatry did recent ABIs which were okay. We will try topical treatments of her foot including Lidoderm patches at night because it bothers her when she is trying to sleep.  Will refer to vascular if necessary but patient and daughter would prefer not to do this if possible. 7. Other hyperlipidemia     No follow-ups on file.      I, [name], MD, have reviewed all documentation for this visit. The documentation on 09/12/20 for the exam, diagnosis, procedures, and orders are all accurate and complete.    Mintie Witherington Cranford Mon, MD  [facility] [phone] (phone) [phone] (fax)  [place]

## 2020-09-14 ENCOUNTER — Encounter: Payer: Self-pay | Admitting: Family Medicine

## 2020-09-19 DIAGNOSIS — I4891 Unspecified atrial fibrillation: Secondary | ICD-10-CM | POA: Diagnosis not present

## 2020-09-19 DIAGNOSIS — R609 Edema, unspecified: Secondary | ICD-10-CM | POA: Diagnosis not present

## 2020-09-19 DIAGNOSIS — E782 Mixed hyperlipidemia: Secondary | ICD-10-CM | POA: Diagnosis not present

## 2020-09-19 DIAGNOSIS — I509 Heart failure, unspecified: Secondary | ICD-10-CM | POA: Diagnosis not present

## 2020-09-19 DIAGNOSIS — I2510 Atherosclerotic heart disease of native coronary artery without angina pectoris: Secondary | ICD-10-CM | POA: Diagnosis not present

## 2020-09-19 DIAGNOSIS — I10 Essential (primary) hypertension: Secondary | ICD-10-CM | POA: Diagnosis not present

## 2020-09-19 DIAGNOSIS — I340 Nonrheumatic mitral (valve) insufficiency: Secondary | ICD-10-CM | POA: Diagnosis not present

## 2020-09-19 DIAGNOSIS — R0602 Shortness of breath: Secondary | ICD-10-CM | POA: Diagnosis not present

## 2020-09-22 ENCOUNTER — Encounter: Payer: Self-pay | Admitting: Family Medicine

## 2020-11-11 ENCOUNTER — Other Ambulatory Visit: Payer: Self-pay | Admitting: Family Medicine

## 2020-11-18 ENCOUNTER — Other Ambulatory Visit: Payer: Self-pay | Admitting: Family Medicine

## 2020-12-01 ENCOUNTER — Emergency Department (HOSPITAL_COMMUNITY)
Admission: EM | Admit: 2020-12-01 | Discharge: 2020-12-01 | Disposition: A | Discharge status: Home / Self Care | Payer: Medicare PPO | Attending: Emergency Medicine | Admitting: Emergency Medicine

## 2020-12-01 ENCOUNTER — Other Ambulatory Visit: Payer: Self-pay

## 2020-12-01 ENCOUNTER — Emergency Department (HOSPITAL_COMMUNITY): Payer: Medicare PPO

## 2020-12-01 DIAGNOSIS — G319 Degenerative disease of nervous system, unspecified: Secondary | ICD-10-CM | POA: Diagnosis not present

## 2020-12-01 DIAGNOSIS — I6782 Cerebral ischemia: Secondary | ICD-10-CM | POA: Diagnosis not present

## 2020-12-01 DIAGNOSIS — S0101XA Laceration without foreign body of scalp, initial encounter: Secondary | ICD-10-CM | POA: Diagnosis not present

## 2020-12-01 DIAGNOSIS — W19XXXA Unspecified fall, initial encounter: Secondary | ICD-10-CM | POA: Diagnosis not present

## 2020-12-01 DIAGNOSIS — W01198A Fall on same level from slipping, tripping and stumbling with subsequent striking against other object, initial encounter: Secondary | ICD-10-CM | POA: Diagnosis not present

## 2020-12-01 DIAGNOSIS — S0093XA Contusion of unspecified part of head, initial encounter: Secondary | ICD-10-CM

## 2020-12-01 DIAGNOSIS — R58 Hemorrhage, not elsewhere classified: Secondary | ICD-10-CM | POA: Diagnosis not present

## 2020-12-01 DIAGNOSIS — Z7901 Long term (current) use of anticoagulants: Secondary | ICD-10-CM | POA: Diagnosis not present

## 2020-12-01 DIAGNOSIS — Y92019 Unspecified place in single-family (private) house as the place of occurrence of the external cause: Secondary | ICD-10-CM | POA: Insufficient documentation

## 2020-12-01 DIAGNOSIS — Z23 Encounter for immunization: Secondary | ICD-10-CM | POA: Insufficient documentation

## 2020-12-01 DIAGNOSIS — S0990XA Unspecified injury of head, initial encounter: Secondary | ICD-10-CM | POA: Diagnosis present

## 2020-12-01 DIAGNOSIS — I10 Essential (primary) hypertension: Secondary | ICD-10-CM | POA: Diagnosis not present

## 2020-12-01 MED ORDER — LIDOCAINE HCL (PF) 1 % IJ SOLN
5.0000 mL | Freq: Once | INTRAMUSCULAR | Status: AC
  Administered 2020-12-01: 5 mL
  Filled 2020-12-01: qty 5

## 2020-12-01 MED ORDER — TETANUS-DIPHTH-ACELL PERTUSSIS 5-2.5-18.5 LF-MCG/0.5 IM SUSY
0.5000 mL | PREFILLED_SYRINGE | Freq: Once | INTRAMUSCULAR | Status: AC
  Administered 2020-12-01: 0.5 mL via INTRAMUSCULAR
  Filled 2020-12-01: qty 0.5

## 2020-12-01 NOTE — ED Triage Notes (Signed)
Patient arrives from home with [facility], patient reached down to pick something up and fell back striking head. Patient is on Eliquis. Denies LOC, was able to walk with EMS and walker PTA. Hx of CKD, Afib, CHF.   EMS vitals 162/78 afib 100  18 L a/c

## 2020-12-01 NOTE — ED Provider Notes (Signed)
[facility] EMERGENCY DEPARTMENT Provider Note   CSN: [id number] Arrival date & time: 12/01/20  2026     History Chief Complaint  Patient presents with  . Level 2  . Fall    Dawn Burnett is a 87 y.o. female.  Patient presents for fall with headache.  She lives at home and was walking across her home when she lost her balance and fell backwards hit the back of her head on the ground.  She is on a blood thinner at baseline.  Denies pain elsewhere denies back pain or neck pain or other extremity pain.  She was able to get up and walk per EMS.  They noticed a laceration to the back of the scalp and given she is on a blood thinner, prior to the ER for further evaluation.        No past medical history on file.  There are no problems to display for this patient.      OB History   No obstetric history on file.     No family history on file.     Home Medications Prior to Admission medications   Not on File    Allergies    Sulfa antibiotics  Review of Systems   Review of Systems  Constitutional: Negative for fever.  HENT: Negative for ear pain.   Eyes: Negative for pain.  Respiratory: Negative for cough.   Cardiovascular: Negative for chest pain.  Gastrointestinal: Negative for abdominal pain.  Genitourinary: Negative for flank pain.  Musculoskeletal: Negative for back pain.  Skin: Negative for rash.  Neurological: Positive for headaches.    Physical Exam Updated Vital Signs BP (!) 144/87   Pulse 78   Temp 97.6 F (36.4 C) (Temporal)   Resp (!) 21   Ht 5\' 7"  (1.702 m)   Wt 62.6 kg   SpO2 97%   BMI 21.61 kg/m   Physical Exam Constitutional:      General: She is not in acute distress.    Appearance: Normal appearance.  HENT:     Head: Normocephalic.     Comments: 3 cm occipital laceration seen.    Nose: Nose normal.  Eyes:     Extraocular Movements: Extraocular movements intact.  Cardiovascular:     Rate and Rhythm: Normal  rate.  Pulmonary:     Effort: Pulmonary effort is normal.  Musculoskeletal:        General: Normal range of motion.     Cervical back: Normal range of motion.     Comments: No C or T or L-spine midline step-offs or tenderness noted.  Skin:    Comments: 3 cm occipital laceration.  Neurological:     General: No focal deficit present.     Mental Status: She is alert. Mental status is at baseline.     ED Results / Procedures / Treatments   Labs (all labs ordered are listed, but only abnormal results are displayed) Labs Reviewed - No data to display  EKG None  Radiology CT Head Wo Contrast  Result Date: 12/01/2020 CLINICAL DATA:  Fall while on anti coagulation. EXAM: CT HEAD WITHOUT CONTRAST CT CERVICAL SPINE WITHOUT CONTRAST TECHNIQUE: Multidetector CT imaging of the head and cervical spine was performed following the standard protocol without intravenous contrast. Multiplanar CT image reconstructions of the cervical spine were also generated. COMPARISON:  None. FINDINGS: CT HEAD FINDINGS Brain: There is no mass, hemorrhage or extra-axial collection. There is generalized atrophy without lobar predilection. There is  hypoattenuation of the periventricular white matter, most commonly indicating chronic ischemic microangiopathy. Vascular: No abnormal hyperdensity of the major intracranial arteries or dural venous sinuses. No intracranial atherosclerosis. Skull: The visualized skull base, calvarium and extracranial soft tissues are normal. Sinuses/Orbits: No fluid levels or advanced mucosal thickening of the visualized paranasal sinuses. No mastoid or middle ear effusion. The orbits are normal. CT CERVICAL SPINE FINDINGS Alignment: No static subluxation. Facets are aligned. Occipital condyles are normally positioned. Skull base and vertebrae: No acute fracture. Soft tissues and spinal canal: No prevertebral fluid or swelling. No visible canal hematoma. Disc levels: No advanced spinal canal or neural  foraminal stenosis. Upper chest: No pneumothorax, pulmonary nodule or pleural effusion. Other: Normal visualized paraspinal cervical soft tissues. IMPRESSION: 1. Chronic ischemic microangiopathy and generalized atrophy without acute intracranial abnormality. 2. No acute fracture or static subluxation of the cervical spine. Electronically Signed   By: Ulyses Jarred M.D.   On: 12/01/2020 21:21   CT Cervical Spine Wo Contrast  Result Date: 12/01/2020 CLINICAL DATA:  Fall while on anti coagulation. EXAM: CT HEAD WITHOUT CONTRAST CT CERVICAL SPINE WITHOUT CONTRAST TECHNIQUE: Multidetector CT imaging of the head and cervical spine was performed following the standard protocol without intravenous contrast. Multiplanar CT image reconstructions of the cervical spine were also generated. COMPARISON:  None. FINDINGS: CT HEAD FINDINGS Brain: There is no mass, hemorrhage or extra-axial collection. There is generalized atrophy without lobar predilection. There is hypoattenuation of the periventricular white matter, most commonly indicating chronic ischemic microangiopathy. Vascular: No abnormal hyperdensity of the major intracranial arteries or dural venous sinuses. No intracranial atherosclerosis. Skull: The visualized skull base, calvarium and extracranial soft tissues are normal. Sinuses/Orbits: No fluid levels or advanced mucosal thickening of the visualized paranasal sinuses. No mastoid or middle ear effusion. The orbits are normal. CT CERVICAL SPINE FINDINGS Alignment: No static subluxation. Facets are aligned. Occipital condyles are normally positioned. Skull base and vertebrae: No acute fracture. Soft tissues and spinal canal: No prevertebral fluid or swelling. No visible canal hematoma. Disc levels: No advanced spinal canal or neural foraminal stenosis. Upper chest: No pneumothorax, pulmonary nodule or pleural effusion. Other: Normal visualized paraspinal cervical soft tissues. IMPRESSION: 1. Chronic ischemic  microangiopathy and generalized atrophy without acute intracranial abnormality. 2. No acute fracture or static subluxation of the cervical spine. Electronically Signed   By: Ulyses Jarred M.D.   On: 12/01/2020 21:21    Procedures .Marland KitchenLaceration Repair  Date/Time: 12/01/2020 10:00 PM Performed by: Luna Fuse, MD Authorized by: Luna Fuse, MD   Comments:     Centimeter occipital laceration.  Wound was thoroughly irrigated with saline under pressure debrided with gauze.  Lidocaine 1% local was given prior.  Total of 2 cc instilled.  Ultimately 3 staples placed with good approximation of edges.  Patient advised return in 7 to 10 days for staple removal.     Medications Ordered in ED Medications  Tdap (BOOSTRIX) injection 0.5 mL (0.5 mLs Intramuscular Given 12/01/20 2045)  lidocaine (PF) (XYLOCAINE) 1 % injection 5 mL (5 mLs Infiltration Given by Other 12/01/20 2046)    ED Course  I have reviewed the triage vital signs and the nursing notes.  Pertinent labs & imaging results that were available during my care of the patient were reviewed by me and considered in my medical decision making (see chart for details).    MDM Rules/Calculators/A&P  Imaging is unremarkable patient has no complaints of pain at this time tetanus is updated.  Had laceration repair advised return in 7 to 10 days, advised immediate return for signs of infection such as redness purulent discharge pain fevers or any additional concerns.  Final Clinical Impression(s) / ED Diagnoses Final diagnoses:  Laceration of scalp, initial encounter  Contusion of head, unspecified part of head, initial encounter    Rx / DC Orders ED Discharge Orders    None       Luna Fuse, MD 12/01/20 2201

## 2020-12-01 NOTE — Discharge Instructions (Addendum)
Keep the wound dry for 48 hours.  You may wash gently afterwards. Return in 7 to 10 days for staple removal.   Return immediately back to the ER if:  Your symptoms worsen within the next 12-24 hours. You develop new symptoms such as new fevers, Increased pain, redness purulent discharge or any additional concerns.

## 2020-12-01 NOTE — ED Notes (Signed)
Pt's son updated, pt's daughter has arrived and will be cleared by security

## 2020-12-01 NOTE — ED Notes (Signed)
Patient transported to CT 

## 2020-12-09 ENCOUNTER — Encounter: Payer: Self-pay | Admitting: Family Medicine

## 2020-12-09 ENCOUNTER — Ambulatory Visit: Payer: Medicare PPO | Admitting: Family Medicine

## 2020-12-09 ENCOUNTER — Other Ambulatory Visit: Payer: Self-pay

## 2020-12-09 VITALS — BP 156/86 | HR 77 | Resp 20 | Ht 67.0 in | Wt 140.6 lb

## 2020-12-09 DIAGNOSIS — I2510 Atherosclerotic heart disease of native coronary artery without angina pectoris: Secondary | ICD-10-CM | POA: Diagnosis not present

## 2020-12-09 DIAGNOSIS — I10 Essential (primary) hypertension: Secondary | ICD-10-CM

## 2020-12-09 DIAGNOSIS — N3946 Mixed incontinence: Secondary | ICD-10-CM

## 2020-12-09 DIAGNOSIS — G629 Polyneuropathy, unspecified: Secondary | ICD-10-CM

## 2020-12-09 DIAGNOSIS — E1121 Type 2 diabetes mellitus with diabetic nephropathy: Secondary | ICD-10-CM

## 2020-12-09 DIAGNOSIS — I5042 Chronic combined systolic (congestive) and diastolic (congestive) heart failure: Secondary | ICD-10-CM

## 2020-12-09 DIAGNOSIS — I4891 Unspecified atrial fibrillation: Secondary | ICD-10-CM

## 2020-12-09 LAB — POCT GLYCOSYLATED HEMOGLOBIN (HGB A1C): Hemoglobin A1C: 7.5 % — AB (ref 4.0–5.6)

## 2020-12-09 NOTE — Progress Notes (Signed)
Established patient visit   Patient: Dawn Burnett   DOB: 1928/10/01   87 y.o. Female  MRN: [id number] Visit Date: 12/09/2020  Today's healthcare provider: [name], MD   Chief Complaint  Patient presents with  . Diabetes  . Hypertension  . Hyperlipidemia  . Atrial Fibrillation  . ER follow up    Subjective    HPI  Patient actually feels pretty well lately.  She just had her 89th birthday and enjoyed seeing family. She has no complaints today. Diabetes Mellitus Type II, follow-up  Lab Results  Component Value Date   HGBA1C 7.5 (A) 12/09/2020   HGBA1C 6.7 (A) 09/09/2020   HGBA1C 6.9 (A) 05/09/2020   Last seen for diabetes 4 months ago.  Management since then includes; A1c under fairly good control at 6.7.  Due to potential side effects of diarrhea at this time will stop Metformin.  A1c in the spring. She reports good compliance with treatment. She is not having side effects.   Home blood sugar records: trend: stable  Episodes of hypoglycemia? No    Current insulin regiment: none Most Recent Eye Exam: 04/2020  --------------------------------------------------------------------------------------------------- Hypertension, follow-up  BP Readings from Last 3 Encounters:  12/09/20 (!) 156/86  12/01/20 (!) 146/126  09/09/20 (!) 166/77   Wt Readings from Last 3 Encounters:  12/09/20 140 lb 9.6 oz (63.8 kg)  12/01/20 138 lb (62.6 kg)  09/09/20 143 lb (64.9 kg)     She was last seen for hypertension 4 months ago.  BP at that visit was 166/77. Management since that visit includes no medication changes. She reports good compliance with treatment. She is not having side effects.  She is not exercising. She is adherent to low salt diet.   Outside blood pressures are checked occasionally.  She does not smoke.  Use of agents associated with hypertension: none.    --------------------------------------------------------------------------------------------------- Lipid/Cholesterol, follow-up  Last Lipid Panel: Lab Results  Component Value Date   CHOL 174 12/14/2016   LDLCALC 77 12/14/2016   HDL 44 12/14/2016   TRIG 263 (H) 12/14/2016    She was last seen for this 4 months ago.  Management since that visit includes; Fair control.  Patient on Lopressor but probably need to add an ARB if her blood pressure does not come down.  She reports good compliance with treatment. She is not having side effects.   Symptoms: No appetite changes No foot ulcerations  No chest pain No chest pressure/discomfort  No dyspnea No orthopnea  No fatigue No lower extremity edema  No palpitations No paroxysmal nocturnal dyspnea  No nausea No numbness or tingling of extremity  No polydipsia No polyuria  No speech difficulty No syncope   She is following a Regular diet. Current exercise: no regular exercise  Last metabolic panel Lab Results  Component Value Date   GLUCOSE 450 (H) 10/30/2019   NA 140 10/30/2019   K 3.6 10/30/2019   BUN 12 10/30/2019   CREATININE 0.98 10/30/2019   GFRNONAA 52 (L) 10/30/2019   GFRAA 60 10/30/2019   CALCIUM 9.3 10/30/2019   AST 16 10/30/2019   ALT 10 10/30/2019   The ASCVD Risk score Mikey Bussing DC Jr., et al., 2013) failed to calculate for the following reasons:   The 2013 ASCVD risk score is only valid for ages 50 to 19  Atrial fibrillation, controlled ([place]) From 09/09/2020-On Eliquis.  Neuropathy From 09/09/2020-This is the patient's main complaint today is 1 of right foot  pain at night.  I think this is neuropathy versus PAD.  There is no claudication.  Podiatry did recent ABIs which were okay. We will try topical treatments of her foot including Lidoderm patches at night because it bothers her when she is trying to sleep.  Will refer to vascular if necessary but patient and daughter would prefer not to do this if  possible.  Follow up ER visit  Patient was seen in ER for fall/head injury on 12/01/2020. She was treated for head injury. Treatment for this included staples in the back of head. She reports good compliance with treatment. She reports this condition is Improved. However, patient reports that she is still tender in the area where she hit her head.        Medications: Outpatient Medications Prior to Visit  Medication Sig  . acetaminophen (TYLENOL) 500 MG tablet Take 500 mg by mouth every 6 (six) hours as needed for mild pain, moderate pain, fever or headache.  Marland Kitchen atorvastatin (LIPITOR) 20 MG tablet Take 20 mg by mouth at bedtime.   . blood glucose meter kit and supplies Dispense based on patient and insurance preference. Use once daily as directed. (FOR ICD-10 E11.21).  . Calcium-Vitamin D 600-200 MG-UNIT per tablet Take 600 tablets by mouth daily with supper.   . celecoxib (CELEBREX) 100 MG capsule TAKE 2 CAPSULES BY MOUTH DAILY AS NEEDED  . COUMADIN 1 MG tablet TAKE 1 (ONE) TABLET TABLET, ORAL, DAILY AS DIRECTED (Patient not taking: Reported on 09/09/2020)  . COUMADIN 4 MG tablet TAKE 1 TABLET AS DIRECTED (Patient not taking: Reported on 09/09/2020)  . digoxin (LANOXIN) 0.125 MG tablet Take 0.125 mg by mouth every other day.   . diltiazem (CARDIZEM CD) 120 MG 24 hr capsule Take 120 mg by mouth daily. (Patient not taking: No sig reported)  . diphenoxylate-atropine (LOMOTIL) 2.5-0.025 MG tablet Take 1 tablet by mouth daily as needed for diarrhea or loose stools. (Patient not taking: No sig reported)  . ELIQUIS 2.5 MG TABS tablet Take 2.5 mg by mouth 2 (two) times daily.   Marland Kitchen ENTRESTO 97-103 MG Take 1 tablet by mouth 2 (two) times daily.   . furosemide (LASIX) 20 MG tablet Take 20 mg by mouth daily.   Marland Kitchen gabapentin (NEURONTIN) 600 MG tablet TAKE 1 TABLET BY MOUTH 3 TIMES DAILY  . imipramine (TOFRANIL) 25 MG tablet TAKE 1 TABLET BY MOUTH AT BEDTIME  . loperamide (IMODIUM) 2 MG capsule Take 2  mg by mouth as needed.   . metFORMIN (GLUCOPHAGE) 500 MG tablet TAKE ONE TABLET TWICE A DAY WITH MEALS  . Methylcellulose, Laxative, 500 MG TABS Take 2 tablets by mouth daily. (Patient not taking: No sig reported)  . metoprolol succinate (TOPROL-XL) 100 MG 24 hr tablet Take 100 mg by mouth daily.  . Multiple Vitamins-Minerals (CENTRUM SILVER ULTRA WOMENS PO) Take 1 tablet by mouth daily with lunch.   . mupirocin cream (BACTROBAN) 2 % Apply 1 application topically daily.  Marland Kitchen MYRBETRIQ 50 MG TB24 tablet TAKE 1 TABLET BY MOUTH DAILY  . NYAMYC powder APPLY TOPICALLY FOUR TIMES A DAY (Patient taking differently: As needed)  . nystatin cream (MYCOSTATIN) APPLY  TOPICALLY 2 (TWO) TIMES DAILY. APPLY TO AFFECTED AREA (Patient not taking: No sig reported)  . omeprazole (PRILOSEC) 20 MG capsule TAKE 1 CAPSULE BY MOUTH ONCE DAILY  . Probiotic Product (ALIGN) 4 MG CAPS Take by mouth every other day.  . ranitidine (ZANTAC) 150 MG tablet TAKE 1 TABLET  BY MOUTH TWO TIMES DAILY (Patient not taking: No sig reported)  . spironolactone (ALDACTONE) 25 MG tablet Take 25 mg by mouth daily.  Marland Kitchen SYNTHROID 50 MCG tablet TAKE 1 TABLET BY MOUTH DAILY BEFORE BREAKFAST  . traMADol (ULTRAM) 50 MG tablet Take 1 tablet (50 mg total) by mouth every 6 (six) hours as needed. (Patient not taking: No sig reported)  . UNABLE TO FIND APPLY TO THE AFFECTED AREA(S) TWICE DAILY ON RIGHT CHEEK FOR 10 DAYS (Patient not taking: No sig reported)   No facility-administered medications prior to visit.    Review of Systems  Constitutional: Negative for appetite change, chills, fatigue and fever.  Respiratory: Negative for chest tightness and shortness of breath.   Cardiovascular: Negative for chest pain and palpitations.  Gastrointestinal: Negative for abdominal pain, nausea and vomiting.  Neurological: Negative for dizziness and weakness.        Objective    BP (!) 156/86   Pulse 77   Resp 20   Ht 5' 7" (1.702 m)   Wt 140 lb 9.6 oz  (63.8 kg)   BMI 22.02 kg/m  Wt Readings from Last 3 Encounters:  12/09/20 140 lb 9.6 oz (63.8 kg)  12/01/20 138 lb (62.6 kg)  09/09/20 143 lb (64.9 kg)       Physical Exam    Results for orders placed or performed in visit on 12/09/20  POCT glycosylated hemoglobin (Hb A1C)  Result Value Ref Range   Hemoglobin A1C 7.5 (A) 4.0 - 5.6 %   HbA1c POC (<> result, manual entry)     HbA1c, POC (prediabetic range)     HbA1c, POC (controlled diabetic range)      Assessment & Plan      1. Type 2 diabetes mellitus with diabetic nephropathy, without long-term current use of insulin (HCC) A1c under good control at 7.30 in this 87 year old.  No changes.  Avoid hypoglycemia as she goes forward - POCT glycosylated hemoglobin (Hb A1C)  2. Arteriosclerosis of coronary artery Risk factors treated. Statin  3. Atrial fibrillation, controlled ([place]) On Coumadin. Unfortunately patient states she has to have a nonsteroidal and is willing to deal with the consequences of being on anticoagulant and nonsteroidal.  This is been discussed at length with her and her daughter.  She is willing to accept the risk.  4. Chronic combined systolic and diastolic congestive heart failure ([place])   5. Essential (primary) hypertension Fair control but I think this is acceptable in this 87 year old.  6. Neuropathy This bothers her more than anything.  7. Mixed stress and urge urinary incontinence Advised to wear a pad and diaper   No follow-ups on file.      I, [name], MD, have reviewed all documentation for this visit. The documentation on 12/13/20 for the exam, diagnosis, procedures, and orders are all accurate and complete.    Arianna Delsanto Cranford Mon, MD  [facility] [phone] (phone) [phone] (fax)  [place]

## 2020-12-16 ENCOUNTER — Other Ambulatory Visit: Payer: Self-pay | Admitting: Family Medicine

## 2020-12-16 DIAGNOSIS — M170 Bilateral primary osteoarthritis of knee: Secondary | ICD-10-CM

## 2020-12-16 NOTE — Telephone Encounter (Signed)
Requested medication (s) are due for refill today Yes  Requested medication (s) are on the active medication list Yes  Future visit scheduled  Yes 04/08/21  LOV 08/16/20  Note to clinic-lab protocol not met-most recent hgb 10/30/19. Routing to provider for consideration.   Requested Prescriptions  Pending Prescriptions Disp Refills   celecoxib (CELEBREX) 100 MG capsule [Pharmacy Med Name: CELECOXIB 100 MG CAP] 90 capsule 1    Sig: TAKE 2 CAPSULES BY MOUTH DAILY AS NEEDED      Analgesics:  COX2 Inhibitors Failed - 12/16/2020  8:27 AM      Failed - HGB in normal range and within 360 days    Hemoglobin  Date Value Ref Range Status  10/30/2019 13.7 11.1 - 15.9 g/dL Final          Failed - Cr in normal range and within 360 days    Creatinine  Date Value Ref Range Status  07/10/2014 1.07 0.60 - 1.30 mg/dL Final   Creatinine, Ser  Date Value Ref Range Status  10/30/2019 0.98 0.57 - 1.00 mg/dL Final          Passed - Patient is not pregnant      Passed - Valid encounter within last 12 months    Recent Outpatient Visits           1 week ago Type 2 diabetes mellitus with diabetic nephropathy, without long-term current use of insulin ([place])   [facility] Jerrol Banana., MD   3 months ago Type 2 diabetes mellitus with diabetic nephropathy, without long-term current use of insulin [facility])   [facility] Jerrol Banana., MD   7 months ago Type 2 diabetes mellitus with diabetic nephropathy, without long-term current use of insulin [facility])   [facility] Jerrol Banana., MD   10 months ago Type 2 diabetes mellitus with diabetic nephropathy, without long-term current use of insulin [facility])   [facility] Jerrol Banana., MD   1 year ago Type 2 diabetes mellitus with diabetic nephropathy, without long-term current use of insulin [facility])   [facility] Jerrol Banana., MD       Future  Appointments             In 3 months Jerrol Banana., MD [facility]

## 2020-12-23 DIAGNOSIS — R609 Edema, unspecified: Secondary | ICD-10-CM | POA: Diagnosis not present

## 2020-12-23 DIAGNOSIS — I340 Nonrheumatic mitral (valve) insufficiency: Secondary | ICD-10-CM | POA: Diagnosis not present

## 2020-12-23 DIAGNOSIS — I10 Essential (primary) hypertension: Secondary | ICD-10-CM | POA: Diagnosis not present

## 2020-12-23 DIAGNOSIS — I4892 Unspecified atrial flutter: Secondary | ICD-10-CM | POA: Diagnosis not present

## 2020-12-23 DIAGNOSIS — R0602 Shortness of breath: Secondary | ICD-10-CM | POA: Diagnosis not present

## 2020-12-23 DIAGNOSIS — E782 Mixed hyperlipidemia: Secondary | ICD-10-CM | POA: Diagnosis not present

## 2020-12-23 DIAGNOSIS — I2510 Atherosclerotic heart disease of native coronary artery without angina pectoris: Secondary | ICD-10-CM | POA: Diagnosis not present

## 2020-12-23 DIAGNOSIS — I4891 Unspecified atrial fibrillation: Secondary | ICD-10-CM | POA: Diagnosis not present

## 2020-12-23 DIAGNOSIS — I509 Heart failure, unspecified: Secondary | ICD-10-CM | POA: Diagnosis not present

## 2021-01-20 ENCOUNTER — Other Ambulatory Visit: Payer: Self-pay | Admitting: Family Medicine

## 2021-01-20 NOTE — Telephone Encounter (Signed)
   Notes to clinic: review for refill Last filled by historical provider     Requested Prescriptions  Pending Prescriptions Disp Refills   spironolactone (ALDACTONE) 25 MG tablet [Pharmacy Med Name: SPIRONOLACTONE 25 MG TAB] 90 tablet     Sig: TAKE 1 TABLET BY MOUTH DAILY      Cardiovascular: Diuretics - Aldosterone Antagonist Failed - 01/20/2021  8:42 AM      Failed - Cr in normal range and within 360 days    Creatinine  Date Value Ref Range Status  07/10/2014 1.07 0.60 - 1.30 mg/dL Final   Creatinine, Ser  Date Value Ref Range Status  10/30/2019 0.98 0.57 - 1.00 mg/dL Final          Failed - K in normal range and within 360 days    Potassium  Date Value Ref Range Status  10/30/2019 3.6 3.5 - 5.2 mmol/L Final  07/10/2014 4.3 3.5 - 5.1 mmol/L Final          Failed - Na in normal range and within 360 days    Sodium  Date Value Ref Range Status  10/30/2019 140 134 - 144 mmol/L Final  07/10/2014 144 136 - 145 mmol/L Final          Failed - Last BP in normal range    BP Readings from Last 1 Encounters:  12/09/20 (!) 156/86          Passed - Valid encounter within last 6 months    Recent Outpatient Visits           1 month ago Type 2 diabetes mellitus with diabetic nephropathy, without long-term current use of insulin ([place])   [facility] Jerrol Banana., MD   4 months ago Type 2 diabetes mellitus with diabetic nephropathy, without long-term current use of insulin ([place])   [facility] Jerrol Banana., MD   8 months ago Type 2 diabetes mellitus with diabetic nephropathy, without long-term current use of insulin [facility])   [facility] Jerrol Banana., MD   11 months ago Type 2 diabetes mellitus with diabetic nephropathy, without long-term current use of insulin [facility])   [facility] Jerrol Banana., MD   1 year ago Type 2 diabetes mellitus with diabetic nephropathy, without long-term  current use of insulin [facility])   [facility] Jerrol Banana., MD       Future Appointments             In 2 months Jerrol Banana., MD [facility], [place]

## 2021-01-20 NOTE — Telephone Encounter (Signed)
   Future visit scheduled: yes   Notes to clinic: medication filled by a historical provider  Review for continued use and refill    Requested Prescriptions  Pending Prescriptions Disp Refills   metoprolol succinate (TOPROL-XL) 100 MG 24 hr tablet [Pharmacy Med Name: METOPROLOL SUCCINATE ER 100 MG TAB] 90 tablet     Sig: TAKE ONE TABLET BY MOUTH EVERY DAY      Cardiovascular:  Beta Blockers Failed - 01/20/2021  8:41 AM      Failed - Last BP in normal range    BP Readings from Last 1 Encounters:  12/09/20 (!) 156/86          Passed - Last Heart Rate in normal range    Pulse Readings from Last 1 Encounters:  12/09/20 77          Passed - Valid encounter within last 6 months    Recent Outpatient Visits           1 month ago Type 2 diabetes mellitus with diabetic nephropathy, without long-term current use of insulin ([place])   [facility] Jerrol Banana., MD   4 months ago Type 2 diabetes mellitus with diabetic nephropathy, without long-term current use of insulin [facility])   [facility] Jerrol Banana., MD   8 months ago Type 2 diabetes mellitus with diabetic nephropathy, without long-term current use of insulin [facility])   [facility] Jerrol Banana., MD   11 months ago Type 2 diabetes mellitus with diabetic nephropathy, without long-term current use of insulin [facility])   [facility] Jerrol Banana., MD   1 year ago Type 2 diabetes mellitus with diabetic nephropathy, without long-term current use of insulin [facility])   [facility] Jerrol Banana., MD       Future Appointments             In 2 months Jerrol Banana., MD [facility]

## 2021-02-17 ENCOUNTER — Other Ambulatory Visit: Payer: Self-pay | Admitting: Family Medicine

## 2021-04-08 ENCOUNTER — Other Ambulatory Visit: Payer: Self-pay

## 2021-04-08 ENCOUNTER — Ambulatory Visit: Payer: Medicare PPO | Admitting: Family Medicine

## 2021-04-08 ENCOUNTER — Encounter: Payer: Self-pay | Admitting: Family Medicine

## 2021-04-08 VITALS — BP 157/76 | HR 80 | Resp 16 | Ht 67.0 in | Wt 141.0 lb

## 2021-04-08 DIAGNOSIS — I4891 Unspecified atrial fibrillation: Secondary | ICD-10-CM

## 2021-04-08 DIAGNOSIS — L03032 Cellulitis of left toe: Secondary | ICD-10-CM

## 2021-04-08 DIAGNOSIS — E039 Hypothyroidism, unspecified: Secondary | ICD-10-CM | POA: Diagnosis not present

## 2021-04-08 DIAGNOSIS — E7849 Other hyperlipidemia: Secondary | ICD-10-CM | POA: Diagnosis not present

## 2021-04-08 DIAGNOSIS — E1121 Type 2 diabetes mellitus with diabetic nephropathy: Secondary | ICD-10-CM | POA: Diagnosis not present

## 2021-04-08 DIAGNOSIS — I10 Essential (primary) hypertension: Secondary | ICD-10-CM

## 2021-04-08 MED ORDER — CEPHALEXIN 500 MG PO CAPS: 500.0000 mg | ORAL_CAPSULE | Freq: Two times a day (BID) | ORAL | 0 refills | Status: DC

## 2021-04-08 NOTE — Progress Notes (Signed)
I,Dawn Burnett,acting as a scribe for Dawn Durie, MD.,have documented all relevant documentation on the behalf of Dawn Durie, MD,as directed by  Dawn Durie, MD while in the presence of Dawn Durie, MD.   Established patient visit   Patient: Dawn Burnett   DOB: [birth year]   87 y.o. Female  MRN: [id number] Visit Date: 04/08/2021  Today's healthcare provider: [name], MD   No chief complaint on file.  Subjective  -------------------------------------------------------------------------------------------------------------------- HPI  Patient has had no recent falls.  She does not feel well but nothing is really changed. Only new issue is one of her great toes is irritated on the right with erythema and the nail is bothering the rest of the toe.  Patient also has chronic arthritic pain.  She states she must take Celebrex every day. She is not suicidal but is ready to pass on. Diabetes Mellitus Type II, follow-up  Lab Results  Component Value Date   HGBA1C 7.5 (A) 12/09/2020   HGBA1C 6.7 (A) 09/09/2020   HGBA1C 6.9 (A) 05/09/2020   Last seen for diabetes 3 months ago.  Management since then includes continuing the same treatment. She reports good compliance with treatment. She is not having side effects. none  Home blood sugar records: fasting range: not checking  Episodes of hypoglycemia? No none   Current insulin regiment: n/a Most Recent Eye Exam: 05/03/2020  --------------------------------------------------------------------------------------------------- Hypertension, follow-up  BP Readings from Last 3 Encounters:  04/08/21 (!) 157/76  12/09/20 (!) 156/86  12/01/20 (!) 146/126   Wt Readings from Last 3 Encounters:  04/08/21 141 lb (64 kg)  12/09/20 140 lb 9.6 oz (63.8 kg)  12/01/20 138 lb (62.6 kg)     She was last seen for hypertension 3 months ago.  BP at that visit was 156/86. Management since that visit includes;  Fair control but I think this is acceptable in this 87 year old. She reports good compliance with treatment. She is not having side effects. none She is not exercising. She is adherent to low salt diet.   Outside blood pressures are checks occasionally.  She does not smoke.  Use of agents associated with hypertension: none.   --------------------------------------------------------------------------------------------------- Lipid/Cholesterol, follow-up  Last Lipid Panel: Lab Results  Component Value Date   CHOL 174 12/14/2016   LDLCALC 77 12/14/2016   HDL 44 12/14/2016   TRIG 263 (H) 12/14/2016    She was last seen for this 12/14/2016.  Management since that visit includes, on atorvastatin.  She reports good compliance with treatment. She is not having side effects. none  She is following a Regular, Low Sodium diet. Current exercise: none  Last metabolic panel Lab Results  Component Value Date   GLUCOSE 450 (H) 10/30/2019   NA 140 10/30/2019   K 3.6 10/30/2019   BUN 12 10/30/2019   CREATININE 0.98 10/30/2019   GFRNONAA 52 (L) 10/30/2019   GFRAA 60 10/30/2019   CALCIUM 9.3 10/30/2019   AST 16 10/30/2019   ALT 10 10/30/2019   The ASCVD Risk score Mikey Bussing DC Jr., et al., 2013) failed to calculate for the following reasons:   The 2013 ASCVD risk score is only valid for ages 94 to 68  ---------------------------------------------------------------------------------------------------      Medications: Outpatient Medications Prior to Visit  Medication Sig   acetaminophen (TYLENOL) 500 MG tablet Take 500 mg by mouth every 6 (six) hours as needed for mild pain, moderate pain, fever or headache.   atorvastatin (LIPITOR) 20  MG tablet Take 20 mg by mouth at bedtime.    blood glucose meter kit and supplies Dispense based on patient and insurance preference. Use once daily as directed. (FOR ICD-10 E11.21).   Calcium-Vitamin D 600-200 MG-UNIT per tablet Take 600 tablets by  mouth daily with supper.    celecoxib (CELEBREX) 100 MG capsule TAKE 2 CAPSULES BY MOUTH DAILY AS NEEDED   digoxin (LANOXIN) 0.125 MG tablet Take 0.125 mg by mouth every other day.    ELIQUIS 2.5 MG TABS tablet Take 2.5 mg by mouth 2 (two) times daily.    ENTRESTO 97-103 MG Take 1 tablet by mouth 2 (two) times daily.    furosemide (LASIX) 20 MG tablet Take 20 mg by mouth daily.    gabapentin (NEURONTIN) 600 MG tablet TAKE 1 TABLET BY MOUTH 3 TIMES DAILY   imipramine (TOFRANIL) 25 MG tablet TAKE 1 TABLET BY MOUTH AT BEDTIME   loperamide (IMODIUM) 2 MG capsule Take 2 mg by mouth as needed.    Methylcellulose, Laxative, 500 MG TABS Take 2 tablets by mouth daily.   metoprolol succinate (TOPROL-XL) 100 MG 24 hr tablet TAKE ONE TABLET BY MOUTH EVERY DAY   Multiple Vitamins-Minerals (CENTRUM SILVER ULTRA WOMENS PO) Take 1 tablet by mouth daily with lunch.    MYRBETRIQ 50 MG TB24 tablet TAKE 1 TABLET BY MOUTH DAILY   NYAMYC powder APPLY TOPICALLY FOUR TIMES A DAY (Patient taking differently: As needed)   omeprazole (PRILOSEC) 20 MG capsule TAKE 1 CAPSULE BY MOUTH ONCE DAILY   Probiotic Product (ALIGN) 4 MG CAPS Take by mouth every other day.   spironolactone (ALDACTONE) 25 MG tablet TAKE 1 TABLET BY MOUTH DAILY   SYNTHROID 50 MCG tablet TAKE 1 TABLET BY MOUTH DAILY BEFORE BREAKFAST   metFORMIN (GLUCOPHAGE) 500 MG tablet TAKE ONE TABLET TWICE A DAY WITH MEALS (Patient not taking: Reported on 04/08/2021)   [DISCONTINUED] COUMADIN 1 MG tablet TAKE 1 (ONE) TABLET TABLET, ORAL, DAILY AS DIRECTED (Patient not taking: No sig reported)   [DISCONTINUED] COUMADIN 4 MG tablet TAKE 1 TABLET AS DIRECTED (Patient not taking: No sig reported)   [DISCONTINUED] diltiazem (CARDIZEM CD) 120 MG 24 hr capsule Take 120 mg by mouth daily. (Patient not taking: No sig reported)   [DISCONTINUED] diphenoxylate-atropine (LOMOTIL) 2.5-0.025 MG tablet Take 1 tablet by mouth daily as needed for diarrhea or loose stools. (Patient not  taking: No sig reported)   [DISCONTINUED] mupirocin cream (BACTROBAN) 2 % Apply 1 application topically daily. (Patient not taking: Reported on 04/08/2021)   [DISCONTINUED] nystatin cream (MYCOSTATIN) APPLY  TOPICALLY 2 (TWO) TIMES DAILY. APPLY TO AFFECTED AREA (Patient not taking: Reported on 04/08/2021)   [DISCONTINUED] ranitidine (ZANTAC) 150 MG tablet TAKE 1 TABLET BY MOUTH TWO TIMES DAILY (Patient not taking: No sig reported)   [DISCONTINUED] traMADol (ULTRAM) 50 MG tablet Take 1 tablet (50 mg total) by mouth every 6 (six) hours as needed. (Patient not taking: No sig reported)   [DISCONTINUED] UNABLE TO FIND APPLY TO THE AFFECTED AREA(S) TWICE DAILY ON RIGHT CHEEK FOR 10 DAYS (Patient not taking: No sig reported)   No facility-administered medications prior to visit.    Review of Systems  Constitutional:  Negative for appetite change, chills, fatigue and fever.  Respiratory:  Negative for chest tightness and shortness of breath.   Cardiovascular:  Negative for chest pain and palpitations.  Gastrointestinal:  Negative for abdominal pain, nausea and vomiting.  Neurological:  Negative for dizziness and weakness.  Objective  -------------------------------------------------------------------------------------------------------------------- BP (!) 157/76 (BP Location: Left Arm, Patient Position: Sitting, Cuff Size: Normal)   Pulse 80   Resp 16   Ht $R'5\' 7"'Tr$  (1.702 m)   Wt 141 lb (64 kg)   SpO2 99%   BMI 22.08 kg/m  Physical Exam Vitals and nursing note reviewed.  Constitutional:      Appearance: Normal appearance. She is normal weight.     Comments: Elderly white female in no acute distress  HENT:     Head: Normocephalic and atraumatic.     Right Ear: External ear normal.     Left Ear: External ear normal.  Eyes:     General: No scleral icterus.    Conjunctiva/sclera: Conjunctivae normal.  Cardiovascular:     Rate and Rhythm: Normal rate and regular rhythm.     Pulses:  Normal pulses.     Heart sounds: Normal heart sounds.  Pulmonary:     Effort: Pulmonary effort is normal.     Breath sounds: Normal breath sounds.  Abdominal:     General: Bowel sounds are normal. There is no distension.     Palpations: Abdomen is soft. There is no mass.     Tenderness: There is no abdominal tenderness.  Musculoskeletal:     Cervical back: Normal range of motion and neck supple.     Right lower leg: Edema present.     Left lower leg: Edema present.  Skin:    General: Skin is warm and dry.     Capillary Refill: Capillary refill takes 2 to 3 seconds.     Comments: Great toe is erythematous with slight drainage from around the nail.  Nail is appears to be thickened and onychomycotic  Neurological:     General: No focal deficit present.     Mental Status: She is alert and oriented to person, place, and time.  Psychiatric:        Mood and Affect: Mood normal.        Behavior: Behavior normal.        Thought Content: Thought content normal.        Judgment: Judgment normal.      No results found for any visits on 04/08/21.  Assessment & Plan  ---------------------------------------------------------------------------------------------------------------------- 1. Type 2 diabetes mellitus with diabetic nephropathy, without long-term current use of insulin (HCC) A1c with other lab work.  Continue metformin - Hemoglobin A1c  2. Essential (primary) hypertension Her blood pressures.  Is also with on Entresto for heart failure. - CBC w/Diff/Platelet - Comprehensive Metabolic Panel (CMET)  3. Adult hypothyroidism   4. Atrial fibrillation, unspecified type ([place]) On anticoagulation per cardiology. - TSH - CBC w/Diff/Platelet  5. Other hyperlipidemia Patient on atorvastatin - Lipid panel - TSH - Comprehensive Metabolic Panel (CMET)  6. Cellulitis of toe of left foot Let Keflex and refer to podiatry for the nail which I think is the source of her problem. -  cephALEXin (KEFLEX) 500 MG capsule; Take 1 capsule (500 mg total) by mouth 2 (two) times daily.  Dispense: 10 capsule; Refill: 0 - Ambulatory referral to Podiatry   No follow-ups on file.      I, Dawn Durie, MD, have reviewed all documentation for this visit. The documentation on 04/12/21 for the exam, diagnosis, procedures, and orders are all accurate and complete.    Madlynn Lundeen Cranford Mon, MD  [facility] [phone] (phone) [phone] (fax)  [place]

## 2021-04-09 LAB — COMPREHENSIVE METABOLIC PANEL
ALT: 7 IU/L (ref 0–32)
AST: 13 IU/L (ref 0–40)
Albumin/Globulin Ratio: 2 (ref 1.2–2.2)
Albumin: 4.1 g/dL (ref 3.5–4.6)
Alkaline Phosphatase: 55 IU/L (ref 44–121)
BUN/Creatinine Ratio: 23 (ref 12–28)
BUN: 23 mg/dL (ref 10–36)
Bilirubin Total: 0.6 mg/dL (ref 0.0–1.2)
CO2: 25 mmol/L (ref 20–29)
Calcium: 9.8 mg/dL (ref 8.7–10.3)
Chloride: 99 mmol/L (ref 96–106)
Creatinine, Ser: 1 mg/dL (ref 0.57–1.00)
Globulin, Total: 2.1 g/dL (ref 1.5–4.5)
Glucose: 200 mg/dL — ABNORMAL HIGH (ref 65–99)
Potassium: 4.7 mmol/L (ref 3.5–5.2)
Sodium: 141 mmol/L (ref 134–144)
Total Protein: 6.2 g/dL (ref 6.0–8.5)
eGFR: 54 mL/min/{1.73_m2} — ABNORMAL LOW (ref >59)

## 2021-04-09 LAB — CBC WITH DIFFERENTIAL/PLATELET
Basophils Absolute: 0 10*3/uL (ref 0.0–0.2)
Basos: 0 %
EOS (ABSOLUTE): 0.1 10*3/uL (ref 0.0–0.4)
Eos: 1 %
Hematocrit: 36.9 % (ref 34.0–46.6)
Hemoglobin: 12.3 g/dL (ref 11.1–15.9)
Immature Grans (Abs): 0 10*3/uL (ref 0.0–0.1)
Immature Granulocytes: 0 %
Lymphocytes Absolute: 1.5 10*3/uL (ref 0.7–3.1)
Lymphs: 22 %
MCH: 32.8 pg (ref 26.6–33.0)
MCHC: 33.3 g/dL (ref 31.5–35.7)
MCV: 98 fL — ABNORMAL HIGH (ref 79–97)
Monocytes Absolute: 0.4 10*3/uL (ref 0.1–0.9)
Monocytes: 6 %
Neutrophils Absolute: 4.9 10*3/uL (ref 1.4–7.0)
Neutrophils: 71 %
Platelets: 148 10*3/uL — ABNORMAL LOW (ref 150–450)
RBC: 3.75 x10E6/uL — ABNORMAL LOW (ref 3.77–5.28)
RDW: 13 % (ref 11.7–15.4)
WBC: 6.9 10*3/uL (ref 3.4–10.8)

## 2021-04-09 LAB — HEMOGLOBIN A1C
Est. average glucose Bld gHb Est-mCnc: 177 mg/dL
Hgb A1c MFr Bld: 7.8 % — ABNORMAL HIGH (ref 4.8–5.6)

## 2021-04-09 LAB — LIPID PANEL
Chol/HDL Ratio: 3.2 ratio (ref 0.0–4.4)
Cholesterol, Total: 156 mg/dL (ref 100–199)
HDL: 49 mg/dL (ref >39)
LDL Chol Calc (NIH): 78 mg/dL (ref 0–99)
Triglycerides: 172 mg/dL — ABNORMAL HIGH (ref 0–149)
VLDL Cholesterol Cal: 29 mg/dL (ref 5–40)

## 2021-04-09 LAB — TSH: TSH: 1.35 u[IU]/mL (ref 0.450–4.500)

## 2021-04-10 DIAGNOSIS — L03032 Cellulitis of left toe: Secondary | ICD-10-CM | POA: Diagnosis not present

## 2021-04-10 DIAGNOSIS — M79674 Pain in right toe(s): Secondary | ICD-10-CM | POA: Diagnosis not present

## 2021-04-10 DIAGNOSIS — I739 Peripheral vascular disease, unspecified: Secondary | ICD-10-CM | POA: Diagnosis not present

## 2021-04-10 DIAGNOSIS — M79675 Pain in left toe(s): Secondary | ICD-10-CM | POA: Diagnosis not present

## 2021-04-10 DIAGNOSIS — E1142 Type 2 diabetes mellitus with diabetic polyneuropathy: Secondary | ICD-10-CM | POA: Diagnosis not present

## 2021-04-10 DIAGNOSIS — B351 Tinea unguium: Secondary | ICD-10-CM | POA: Diagnosis not present

## 2021-04-24 ENCOUNTER — Other Ambulatory Visit: Payer: Self-pay | Admitting: Family Medicine

## 2021-04-24 DIAGNOSIS — E039 Hypothyroidism, unspecified: Secondary | ICD-10-CM

## 2021-04-28 DIAGNOSIS — I10 Essential (primary) hypertension: Secondary | ICD-10-CM | POA: Diagnosis not present

## 2021-04-28 DIAGNOSIS — E669 Obesity, unspecified: Secondary | ICD-10-CM | POA: Diagnosis not present

## 2021-04-28 DIAGNOSIS — R609 Edema, unspecified: Secondary | ICD-10-CM | POA: Diagnosis not present

## 2021-04-28 DIAGNOSIS — I4891 Unspecified atrial fibrillation: Secondary | ICD-10-CM | POA: Diagnosis not present

## 2021-04-28 DIAGNOSIS — I2510 Atherosclerotic heart disease of native coronary artery without angina pectoris: Secondary | ICD-10-CM | POA: Diagnosis not present

## 2021-04-28 DIAGNOSIS — I509 Heart failure, unspecified: Secondary | ICD-10-CM | POA: Diagnosis not present

## 2021-04-28 DIAGNOSIS — I279 Pulmonary heart disease, unspecified: Secondary | ICD-10-CM | POA: Diagnosis not present

## 2021-04-28 DIAGNOSIS — E782 Mixed hyperlipidemia: Secondary | ICD-10-CM | POA: Diagnosis not present

## 2021-04-28 DIAGNOSIS — I340 Nonrheumatic mitral (valve) insufficiency: Secondary | ICD-10-CM | POA: Diagnosis not present

## 2021-05-01 ENCOUNTER — Ambulatory Visit: Payer: Self-pay | Admitting: *Deleted

## 2021-05-01 NOTE — Telephone Encounter (Signed)
Please review. Thanks!  

## 2021-05-01 NOTE — Telephone Encounter (Signed)
Advised patient's daughter as below.

## 2021-05-01 NOTE — Telephone Encounter (Addendum)
Pt's daughter Ander Purpura calling in at pt's request.   Pt has a sore throat since Tues. Without any other symptoms but is requesting to come in and see the dr.    I went over the home care advice for sore throat, warm salt water gargles, throat lozenges, plenty of fluids for hydration, etc.    Pt denies fever, nasal congestion, diarrhea, nausea, vomiting, post nasal drip, body aches or chills, Covid test is negative, denies seasonal allergies, difficulty swallowing.   No sick exposures she is aware of.  There are no appts available within the timeframe indicated on the protocol.   Pt was agreeable to having someone call her back regarding an appt.   I put her on the waiting list.    Daughter Lala Lund asked that she be called regarding the appt [phone].

## 2021-05-01 NOTE — Telephone Encounter (Signed)
Reason for Disposition  [1] Sore throat is the only symptom AND [2] present > 48 hours  Answer Assessment - Initial Assessment Questions 1. ONSET: "When did the throat start hurting?" (Hours or days ago)      Daughter Lala Lund callling in.   Her mother has a sore throat started Tues. Night before going to bed. 2. SEVERITY: "How bad is the sore throat?" (Scale 1-10; mild, moderate or severe)   - MILD (1-3):  doesn't interfere with eating or normal activities   - MODERATE (4-7): interferes with eating some solids and normal activities   - SEVERE (8-10):  excruciating pain, interferes with most normal activities   - SEVERE DYSPHAGIA: can't swallow liquids, drooling     That's the only thing she is complaining about is a sore throat.   No nasal congestion.   Mother is using salt water.    Mother takes so much medicine.   She thinks she needs to see the dr.   No coughing or diarrhea.    3. STREP EXPOSURE: "Has there been any exposure to strep within the past week?" If Yes, ask: "What type of contact occurred?"      No exposures.   Covid negative.    4.  VIRAL SYMPTOMS: "Are there any symptoms of a cold, such as a runny nose, cough, hoarse voice or red eyes?"      No only the sore throat since Tues.   It hurts to swallow but she is eating and drinking fluids fine. 5. FEVER: "Do you have a fever?" If Yes, ask: "What is your temperature, how was it measured, and when did it start?"     No fever.   Covid test is negative.    6. PUS ON THE TONSILS: "Is there pus on the tonsils in the back of your throat?"     Not sure 7. OTHER SYMPTOMS: "Do you have any other symptoms?" (e.g., difficulty breathing, headache, rash)     No headache.    Just the sore throat. 8. PREGNANCY: "Is there any chance you are pregnant?" "When was your last menstrual period?"     N/A due to age  Protocols used: Sore Throat-A-AH

## 2021-05-07 DIAGNOSIS — Z0100 Encounter for examination of eyes and vision without abnormal findings: Secondary | ICD-10-CM | POA: Diagnosis not present

## 2021-05-07 DIAGNOSIS — H02135 Senile ectropion of left lower eyelid: Secondary | ICD-10-CM | POA: Diagnosis not present

## 2021-05-12 ENCOUNTER — Other Ambulatory Visit: Payer: Self-pay | Admitting: Family Medicine

## 2021-05-12 DIAGNOSIS — E1121 Type 2 diabetes mellitus with diabetic nephropathy: Secondary | ICD-10-CM

## 2021-05-12 DIAGNOSIS — R350 Frequency of micturition: Secondary | ICD-10-CM

## 2021-05-12 DIAGNOSIS — G629 Polyneuropathy, unspecified: Secondary | ICD-10-CM

## 2021-05-12 NOTE — Telephone Encounter (Signed)
Requested Prescriptions  Pending Prescriptions Disp Refills  . imipramine (TOFRANIL) 25 MG tablet [Pharmacy Med Name: IMIPRAMINE HCL 25 MG TAB] 90 tablet 1    Sig: TAKE 1 TABLET BY MOUTH AT BEDTIME     Psychiatry:  Antidepressants - Heterocyclics (TCAs) Passed - 05/12/2021  9:06 AM      Passed - Valid encounter within last 6 months    Recent Outpatient Visits          1 month ago Type 2 diabetes mellitus with diabetic nephropathy, without long-term current use of insulin [facility])   [facility] Jerrol Banana., MD   5 months ago Type 2 diabetes mellitus with diabetic nephropathy, without long-term current use of insulin [facility])   [facility] Jerrol [facility] MD   8 months ago Type 2 diabetes mellitus with diabetic nephropathy, without long-term current use of insulin [facility])   [facility] Jerrol Banana., MD   1 year ago Type 2 diabetes mellitus with diabetic nephropathy, without long-term current use of insulin [facility])   [facility] Jerrol Banana., MD   1 year ago Type 2 diabetes mellitus with diabetic nephropathy, without long-term current use of insulin [facility])   [facility] Jerrol Banana., MD      Future Appointments            In 3 months Jerrol Banana., MD [facility]

## 2021-06-03 ENCOUNTER — Other Ambulatory Visit: Payer: Self-pay | Admitting: Family Medicine

## 2021-06-03 DIAGNOSIS — I739 Peripheral vascular disease, unspecified: Secondary | ICD-10-CM | POA: Diagnosis not present

## 2021-06-03 DIAGNOSIS — M1990 Unspecified osteoarthritis, unspecified site: Secondary | ICD-10-CM | POA: Diagnosis not present

## 2021-06-03 DIAGNOSIS — I110 Hypertensive heart disease with heart failure: Secondary | ICD-10-CM | POA: Diagnosis not present

## 2021-06-03 DIAGNOSIS — N3281 Overactive bladder: Secondary | ICD-10-CM | POA: Diagnosis not present

## 2021-06-03 DIAGNOSIS — Z7409 Other reduced mobility: Secondary | ICD-10-CM | POA: Diagnosis not present

## 2021-06-03 DIAGNOSIS — I4891 Unspecified atrial fibrillation: Secondary | ICD-10-CM | POA: Diagnosis not present

## 2021-06-03 DIAGNOSIS — E039 Hypothyroidism, unspecified: Secondary | ICD-10-CM | POA: Diagnosis not present

## 2021-06-03 DIAGNOSIS — K5900 Constipation, unspecified: Secondary | ICD-10-CM | POA: Diagnosis not present

## 2021-06-03 DIAGNOSIS — Z604 Social exclusion and rejection: Secondary | ICD-10-CM | POA: Diagnosis not present

## 2021-06-03 DIAGNOSIS — E261 Secondary hyperaldosteronism: Secondary | ICD-10-CM | POA: Diagnosis not present

## 2021-06-03 DIAGNOSIS — I509 Heart failure, unspecified: Secondary | ICD-10-CM | POA: Diagnosis not present

## 2021-06-03 DIAGNOSIS — G8929 Other chronic pain: Secondary | ICD-10-CM | POA: Diagnosis not present

## 2021-06-03 DIAGNOSIS — R32 Unspecified urinary incontinence: Secondary | ICD-10-CM | POA: Diagnosis not present

## 2021-06-03 DIAGNOSIS — I2510 Atherosclerotic heart disease of native coronary artery without angina pectoris: Secondary | ICD-10-CM | POA: Diagnosis not present

## 2021-06-03 DIAGNOSIS — D6869 Other thrombophilia: Secondary | ICD-10-CM | POA: Diagnosis not present

## 2021-06-03 DIAGNOSIS — E785 Hyperlipidemia, unspecified: Secondary | ICD-10-CM | POA: Diagnosis not present

## 2021-06-03 DIAGNOSIS — F324 Major depressive disorder, single episode, in partial remission: Secondary | ICD-10-CM | POA: Diagnosis not present

## 2021-06-03 DIAGNOSIS — K219 Gastro-esophageal reflux disease without esophagitis: Secondary | ICD-10-CM | POA: Diagnosis not present

## 2021-06-03 NOTE — Telephone Encounter (Signed)
Please advise refill?  LOV: 04/08/21 NOV: 08/20/21 Last refill: 04/11/2019 15 g w/0 refills

## 2021-06-03 NOTE — Telephone Encounter (Signed)
Copied from [place] [id number]. Topic: Quick Communication - Rx Refill/Question >> Jun 03, 2021 10:00 AM Leward Quan A wrote: Medication:  Nystatin 60g of 100000 units/g bottle of topical powder   Has the patient contacted their pharmacy? No. Pharmacy changed (Agent: If no, request that the patient contact the pharmacy for the refill. If patient does not wish to contact the pharmacy document the reason why and proceed with request.) (Agent: If yes, when and what did the pharmacy advise?)  Preferred Pharmacy (with phone number or street name): [place], [place] - [place]  Phone:  [phone] Fax:  [phone]    Has the patient been seen for an appointment in the last year OR does the patient have an upcoming appointment? Yes.    Agent: Please be advised that RX refills may take up to 3 business days. We ask that you follow-up with your pharmacy.

## 2021-06-03 NOTE — Telephone Encounter (Signed)
Requested medication (s) are due for refill today expired Rx  Requested medication (s) are on the active medication list yes  Future visit scheduled -yes  Last refill: 04/11/19 15g  Notes to clinic: Request RF: expired Rx, not assigned protocol  Requested Prescriptions  Pending Prescriptions Disp Refills   nystatin (NYAMYC) powder 15 g 0     Off-Protocol Failed - 06/03/2021 10:17 AM      Failed - Medication not assigned to a protocol, review manually.      Passed - Valid encounter within last 12 months    Recent Outpatient Visits           1 month ago Type 2 diabetes mellitus with diabetic nephropathy, without long-term current use of insulin ([place])   [facility] Jerrol Ban[facility] MD   5 months ago Type 2 diabetes mellitus with diabetic nephropathy, without long-term current use of insulin [facility])   [facility] Jerrol Banana., MD   8 months ago Type 2 diabetes mellitus with diabetic nephropathy, without long-term current use of insulin [facility])   [facility] Jerrol Banana., MD   1 year ago Type 2 diabetes mellitus with diabetic nephropathy, without long-term current use of insulin [facility])   [facility] Jerrol Banana., MD   1 year ago Type 2 diabetes mellitus with diabetic nephropathy, without long-term current use of insulin [facility])   [facility] Ban[facility] MD       Future Appointments             In 2 months Jerrol Banana., MD [facility]               Requested Prescriptions  Pending Prescriptions Disp Refills   nystatin [facility]) powder 15 g 0     Off-Protocol Failed - 06/03/2021 10:17 AM      Failed - Medication not assigned to a protocol, review manually.      Passed - Valid encounter within last 12 months    Recent Outpatient Visits           1 month ago Type 2 diabetes mellitus with diabetic nephropathy, without  long-term current use of insulin [facility])   [facility] Jerrol Banana., MD   5 months ago Type 2 diabetes mellitus with diabetic nephropathy, without long-term current use of insulin [facility])   [facility] Jerrol Banana., MD   8 months ago Type 2 diabetes mellitus with diabetic nephropathy, without long-term current use of insulin [facility])   [facility] Jerrol Banana., MD   1 year ago Type 2 diabetes mellitus with diabetic nephropathy, without long-term current use of insulin [facility])   [facility] Jerrol Banana., MD   1 year ago Type 2 diabetes mellitus with diabetic nephropathy, without long-term current use of insulin [facility])   [facility] Jerrol Banana., MD       Future Appointments             In 2 months Jerrol Banana., MD [facility]

## 2021-06-04 MED ORDER — NYSTATIN 100000 UNIT/GM EX POWD: CUTANEOUS | 0 refills | Status: DC

## 2021-07-28 ENCOUNTER — Other Ambulatory Visit: Payer: Self-pay | Admitting: Family Medicine

## 2021-07-28 DIAGNOSIS — E039 Hypothyroidism, unspecified: Secondary | ICD-10-CM

## 2021-08-20 ENCOUNTER — Ambulatory Visit: Payer: Medicare PPO | Admitting: Family Medicine

## 2021-09-08 ENCOUNTER — Other Ambulatory Visit: Payer: Self-pay | Admitting: Family Medicine

## 2021-09-08 DIAGNOSIS — M170 Bilateral primary osteoarthritis of knee: Secondary | ICD-10-CM

## 2021-10-06 ENCOUNTER — Other Ambulatory Visit: Payer: Self-pay | Admitting: Family Medicine

## 2021-10-07 ENCOUNTER — Other Ambulatory Visit: Payer: Self-pay | Admitting: Family Medicine

## 2021-10-07 NOTE — Telephone Encounter (Signed)
Appointment 10/20/21 Requested Prescriptions  Pending Prescriptions Disp Refills   gabapentin (NEURONTIN) 600 MG tablet [Pharmacy Med Name: GABAPENTIN 600 MG TAB] 90 tablet 9    Sig: TAKE 1 TABLET BY MOUTH 3 TIMES DAILY     Neurology: Anticonvulsants - gabapentin Failed - 10/06/2021 10:14 AM      Failed - Completed PHQ-2 or PHQ-9 in the last 360 days      Passed - Cr in normal range and within 360 days    Creatinine  Date Value Ref Range Status  07/10/2014 1.07 0.60 - 1.30 mg/dL Final   Creatinine, Ser  Date Value Ref Range Status  04/08/2021 1.00 0.57 - 1.00 mg/dL Final         Passed - Valid encounter within last 12 months    Recent Outpatient Visits          6 months ago Type 2 diabetes mellitus with diabetic nephropathy, without long-term current use of insulin ([place])   [facility] Jerrol Ban[facility]., MD   10 months ago Type 2 diabetes mellitus with diabetic nephropathy, without long-term current use of insulin [facility])   [facility] Jerrol Banana., MD   1 year ago Type 2 diabetes mellitus with diabetic nephropathy, without long-term current use of insulin [facility])   [facility] Jerrol Banana., MD   1 year ago Type 2 diabetes mellitus with diabetic nephropathy, without long-term current use of insulin [facility])   [facility] Jerrol Banana., MD   1 year ago Type 2 diabetes mellitus with diabetic nephropathy, without long-term current use of insulin [facility])   [facility] Jerrol Banana., MD      Future Appointments            In 1 week Jerrol Banana., MD [facility]

## 2021-10-08 NOTE — Telephone Encounter (Signed)
Refilled 10/07/2021 #90 0 refills. ?Requested Prescriptions  ?Pending Prescriptions Disp Refills  ?? gabapentin (NEURONTIN) 600 MG tablet [Pharmacy Med Name: GABAPENTIN 600 MG TAB] 90 tablet 0  ?  Sig: TAKE 1 TABLET BY MOUTH 3 TIMES DAILY  ?  ? Neurology: Anticonvulsants - gabapentin Failed - 10/07/2021 12:27 PM  ?  ?  Failed - Completed PHQ-2 or PHQ-9 in the last 360 days  ?  ?  Passed - Cr in normal range and within 360 days  ?  Creatinine  ?Date Value Ref Range Status  ?07/10/2014 1.07 0.60 - 1.30 mg/dL Final  ? ?Creatinine, Ser  ?Date Value Ref Range Status  ?04/08/2021 1.00 0.57 - 1.00 mg/dL Final  ?   ?  ?  Passed - Valid encounter within last 12 months  ?  Recent Outpatient Visits   ?      ? 6 months ago Type 2 diabetes mellitus with diabetic nephropathy, without long-term current use of insulin ([place])  ? [facility] Ban[facility] MD  ? 10 months ago Type 2 diabetes mellitus with diabetic nephropathy, without long-term current use of insulin ([place])  ? [facility] Jerrol Banana., MD  ? 1 year ago Type 2 diabetes mellitus with diabetic nephropathy, without long-term current use of insulin ([place])  ? [facility] Jerrol Banana., MD  ? 1 year ago Type 2 diabetes mellitus with diabetic nephropathy, without long-term current use of insulin ([place])  ? [facility] Jerrol Banana., MD  ? 1 year ago Type 2 diabetes mellitus with diabetic nephropathy, without long-term current use of insulin ([place])  ? [facility] Jerrol Banana., MD  ?  ?  ?Future Appointments   ?        ? In 1 week Jerrol Banana., MD [facility]  ?  ? ?  ?  ?  ? ?

## 2021-10-16 DIAGNOSIS — M79674 Pain in right toe(s): Secondary | ICD-10-CM | POA: Diagnosis not present

## 2021-10-16 DIAGNOSIS — B351 Tinea unguium: Secondary | ICD-10-CM | POA: Diagnosis not present

## 2021-10-16 DIAGNOSIS — I739 Peripheral vascular disease, unspecified: Secondary | ICD-10-CM | POA: Diagnosis not present

## 2021-10-16 DIAGNOSIS — M79675 Pain in left toe(s): Secondary | ICD-10-CM | POA: Diagnosis not present

## 2021-10-16 DIAGNOSIS — S90211A Contusion of right great toe with damage to nail, initial encounter: Secondary | ICD-10-CM | POA: Diagnosis not present

## 2021-10-16 DIAGNOSIS — E1142 Type 2 diabetes mellitus with diabetic polyneuropathy: Secondary | ICD-10-CM | POA: Diagnosis not present

## 2021-10-17 NOTE — Progress Notes (Unsigned)
Established patient visit . I,Shauntel Prest,acting as a scribe for [name], MD.,have documented all relevant documentation on the behalf of [name], MD,as directed by  [name], MD while in the presence of [name], MD.   Patient: Dawn Burnett   DOB: 1929/02/11   88 y.o. Female  MRN: [id number] Visit Date: 10/20/2021  Today's healthcare provider: [name], MD   Chief Complaint  Patient presents with   Follow-up   Diabetes   Hypertension   Subjective    HPI  Diabetes Mellitus Type II, follow-up  Lab Results  Component Value Date   HGBA1C 8.8 (A) 10/20/2021   HGBA1C 7.8 (H) 04/08/2021   HGBA1C 7.5 (A) 12/09/2020   Last seen for diabetes 7 months ago.  Management since then includes continuing the same treatment. She reports good compliance with treatment. She is not having side effects. none  Home blood sugar records: not checking  Episodes of hypoglycemia? No none   Current insulin regiment: n/a Most Recent Eye Exam: yearly at [facility]  --------------------------------------------------------------------------------------------------- Hypertension, follow-up  BP Readings from Last 3 Encounters:  10/20/21 (!) 145/81  04/08/21 (!) 157/76  12/09/20 (!) 156/86   Wt Readings from Last 3 Encounters:  10/20/21 149 lb (67.6 kg)  04/08/21 141 lb (64 kg)  12/09/20 140 lb 9.6 oz (63.8 kg)     She was last seen for hypertension 7 months ago.  BP at that visit was 157/76 Management at last visit includes.  Is also on Entresto for heart failure. She reports good compliance with treatment. She is not having side effects. none She is not exercising. She is adherent to low salt diet.   Outside blood pressures are checks occasionally .  She does not smoke.  Use of agents associated with hypertension: none.    ---------------------------------------------------------------------------------------------------  Medications: Outpatient Medications Prior to Visit  Medication Sig   acetaminophen (TYLENOL) 500 MG tablet Take 500 mg by mouth every 6 (six) hours as needed for mild pain, moderate pain, fever or headache.   atorvastatin (LIPITOR) 20 MG tablet Take 20 mg by mouth at bedtime.    blood glucose meter kit and supplies Dispense based on patient and insurance preference. Use once daily as directed. (FOR ICD-10 E11.21).   Calcium-Vitamin D 600-200 MG-UNIT per tablet Take 600 tablets by mouth daily with supper.    celecoxib (CELEBREX) 100 MG capsule TAKE 2 CAPSULES BY MOUTH DAILY AS NEEDED   digoxin (LANOXIN) 0.125 MG tablet Take 0.125 mg by mouth every other day.    ELIQUIS 2.5 MG TABS tablet Take 2.5 mg by mouth 2 (two) times daily.    ENTRESTO 97-103 MG Take 1 tablet by mouth 2 (two) times daily.    furosemide (LASIX) 20 MG tablet Take 20 mg by mouth daily.    gabapentin (NEURONTIN) 600 MG tablet TAKE 1 TABLET BY MOUTH 3 TIMES DAILY   imipramine (TOFRANIL) 25 MG tablet TAKE 1 TABLET BY MOUTH AT BEDTIME   loperamide (IMODIUM) 2 MG capsule Take 2 mg by mouth as needed.    Methylcellulose, Laxative, 500 MG TABS Take 2 tablets by mouth daily.   metoprolol succinate (TOPROL-XL) 100 MG 24 hr tablet TAKE ONE TABLET BY MOUTH EVERY DAY   Multiple Vitamins-Minerals (CENTRUM SILVER ULTRA WOMENS PO) Take 1 tablet by mouth daily with lunch.    MYRBETRIQ 50 MG TB24 tablet TAKE 1 TABLET BY MOUTH DAILY   nystatin [facility])  powder APPLY TOPICALLY FOUR TIMES A DAY   omeprazole (PRILOSEC) 20 MG capsule TAKE 1 CAPSULE BY MOUTH ONCE DAILY   Probiotic Product (ALIGN) 4 MG CAPS Take by mouth every other day.   spironolactone (ALDACTONE) 25 MG tablet TAKE 1 TABLET BY MOUTH DAILY   SYNTHROID 50 MCG tablet TAKE 1 TABLET BY MOUTH DAILY BEFORE BREAKFAST   [DISCONTINUED] cephALEXin (KEFLEX) 500 MG capsule Take 1 capsule  (500 mg total) by mouth 2 (two) times daily. (Patient not taking: Reported on 10/20/2021)   [DISCONTINUED] metFORMIN (GLUCOPHAGE) 500 MG tablet TAKE ONE TABLET TWICE A DAY WITH MEALS (Patient not taking: Reported on 04/08/2021)   No facility-administered medications prior to visit.    Review of Systems  Constitutional:  Negative for appetite change, chills, fatigue and fever.  Respiratory:  Negative for chest tightness and shortness of breath.   Cardiovascular:  Negative for chest pain and palpitations.  Gastrointestinal:  Negative for abdominal pain, nausea and vomiting.  Neurological:  Negative for dizziness and weakness.   {Labs   Heme   Chem   Endocrine   Serology   Results Review (optional):23779}   Objective    BP (!) 145/81 (BP Location: Right Arm, Patient Position: Sitting, Cuff Size: Normal)    Pulse 70    Temp 98.2 F (36.8 C) (Temporal)    Resp 18    Wt 149 lb (67.6 kg)    SpO2 98%    BMI 23.34 kg/m  {Show previous vital signs (optional):23777}  Physical Exam  ***  Results for orders placed or performed in visit on 10/20/21  POCT glycosylated hemoglobin (Hb A1C)  Result Value Ref Range   Hemoglobin A1C 8.8 (A) 4.0 - 5.6 %   Est. average glucose Bld gHb Est-mCnc 206     Assessment & Plan     1. Type 2 diabetes mellitus with diabetic nephropathy, without long-term current use of insulin (HCC)  - POCT glycosylated hemoglobin (Hb A1C)--8.8 today.  2. Essential (primary) hypertension   Return in about 6 months (around 04/22/2022).      {provider attestation***:1}   [name], MD  [facility] [phone] (phone) [phone] (fax)  [place]

## 2021-10-20 ENCOUNTER — Encounter: Payer: Self-pay | Admitting: Family Medicine

## 2021-10-20 ENCOUNTER — Other Ambulatory Visit: Payer: Self-pay

## 2021-10-20 ENCOUNTER — Ambulatory Visit: Payer: Medicare PPO | Admitting: Family Medicine

## 2021-10-20 VITALS — BP 145/81 | HR 70 | Temp 98.2°F | Resp 18 | Wt 149.0 lb

## 2021-10-20 DIAGNOSIS — M8580 Other specified disorders of bone density and structure, unspecified site: Secondary | ICD-10-CM

## 2021-10-20 DIAGNOSIS — I4891 Unspecified atrial fibrillation: Secondary | ICD-10-CM

## 2021-10-20 DIAGNOSIS — I5042 Chronic combined systolic (congestive) and diastolic (congestive) heart failure: Secondary | ICD-10-CM | POA: Diagnosis not present

## 2021-10-20 DIAGNOSIS — E1121 Type 2 diabetes mellitus with diabetic nephropathy: Secondary | ICD-10-CM

## 2021-10-20 DIAGNOSIS — E7849 Other hyperlipidemia: Secondary | ICD-10-CM

## 2021-10-20 DIAGNOSIS — I10 Essential (primary) hypertension: Secondary | ICD-10-CM

## 2021-10-20 DIAGNOSIS — I2510 Atherosclerotic heart disease of native coronary artery without angina pectoris: Secondary | ICD-10-CM

## 2021-10-20 DIAGNOSIS — M170 Bilateral primary osteoarthritis of knee: Secondary | ICD-10-CM | POA: Diagnosis not present

## 2021-10-20 LAB — POCT GLYCOSYLATED HEMOGLOBIN (HGB A1C)
Est. average glucose Bld gHb Est-mCnc: 206
Hemoglobin A1C: 8.8 % — AB (ref 4.0–5.6)

## 2021-11-03 ENCOUNTER — Other Ambulatory Visit: Payer: Self-pay | Admitting: Family Medicine

## 2021-11-17 ENCOUNTER — Other Ambulatory Visit: Payer: Self-pay | Admitting: Family Medicine

## 2021-12-08 ENCOUNTER — Other Ambulatory Visit: Payer: Self-pay | Admitting: Family Medicine

## 2021-12-08 DIAGNOSIS — M170 Bilateral primary osteoarthritis of knee: Secondary | ICD-10-CM

## 2021-12-25 ENCOUNTER — Telehealth: Payer: Self-pay | Admitting: Family Medicine

## 2021-12-25 NOTE — Telephone Encounter (Signed)
Copied from [place] [id number]. Topic: Medicare AWV >> Dec 25, 2021 10:59 AM Cher Nakai R wrote: Reason for CRM:  Left message for patient to call back and schedule Medicare Annual Wellness Visit (AWV) in office.   If unable to come into the office for AWV,  please offer to do virtually or by telephone.  Last AWV: 01/24/2020  Please schedule at anytime with [facility] Health Advisor.  30 minute appointment for Virtual or phone 45 minute appointment for in office or Initial virtual/phone  Any questions, please contact me at [phone]

## 2021-12-30 DIAGNOSIS — B351 Tinea unguium: Secondary | ICD-10-CM | POA: Diagnosis not present

## 2021-12-30 DIAGNOSIS — E1142 Type 2 diabetes mellitus with diabetic polyneuropathy: Secondary | ICD-10-CM | POA: Diagnosis not present

## 2021-12-30 DIAGNOSIS — I739 Peripheral vascular disease, unspecified: Secondary | ICD-10-CM | POA: Diagnosis not present

## 2021-12-30 DIAGNOSIS — E1121 Type 2 diabetes mellitus with diabetic nephropathy: Secondary | ICD-10-CM | POA: Diagnosis not present

## 2021-12-30 DIAGNOSIS — M79675 Pain in left toe(s): Secondary | ICD-10-CM | POA: Diagnosis not present

## 2021-12-30 DIAGNOSIS — M79674 Pain in right toe(s): Secondary | ICD-10-CM | POA: Diagnosis not present

## 2022-01-12 ENCOUNTER — Other Ambulatory Visit: Payer: Self-pay | Admitting: Family Medicine

## 2022-01-26 ENCOUNTER — Other Ambulatory Visit: Payer: Self-pay | Admitting: Family Medicine

## 2022-01-26 DIAGNOSIS — E039 Hypothyroidism, unspecified: Secondary | ICD-10-CM

## 2022-02-05 ENCOUNTER — Telehealth: Payer: Self-pay | Admitting: Family Medicine

## 2022-02-05 NOTE — Telephone Encounter (Signed)
Copied from [place] [id number]. Topic: Medicare AWV >> Feb 05, 2022  3:10 PM Jae Dire wrote: Reason for CRM:  Left message for patient to call back and schedule Medicare Annual Wellness Visit (AWV) in office.   If unable to come into the office for AWV,  please offer to do virtually or by telephone.  Last AWV: 01/24/2020 Please schedule at anytime with [facility] Health Advisor.  30 minute appointment for Virtual or phone 45 minute appointment for in office or Initial virtual/phone  Any questions, please contact me at [phone]

## 2022-02-09 ENCOUNTER — Other Ambulatory Visit: Payer: Self-pay | Admitting: Family Medicine

## 2022-02-09 DIAGNOSIS — R350 Frequency of micturition: Secondary | ICD-10-CM

## 2022-02-09 DIAGNOSIS — E1121 Type 2 diabetes mellitus with diabetic nephropathy: Secondary | ICD-10-CM

## 2022-02-09 DIAGNOSIS — G629 Polyneuropathy, unspecified: Secondary | ICD-10-CM

## 2022-02-17 DIAGNOSIS — E669 Obesity, unspecified: Secondary | ICD-10-CM | POA: Diagnosis not present

## 2022-02-17 DIAGNOSIS — I509 Heart failure, unspecified: Secondary | ICD-10-CM | POA: Diagnosis not present

## 2022-02-17 DIAGNOSIS — I4891 Unspecified atrial fibrillation: Secondary | ICD-10-CM | POA: Diagnosis not present

## 2022-02-17 DIAGNOSIS — R609 Edema, unspecified: Secondary | ICD-10-CM | POA: Diagnosis not present

## 2022-02-17 DIAGNOSIS — I2510 Atherosclerotic heart disease of native coronary artery without angina pectoris: Secondary | ICD-10-CM | POA: Diagnosis not present

## 2022-02-17 DIAGNOSIS — E782 Mixed hyperlipidemia: Secondary | ICD-10-CM | POA: Diagnosis not present

## 2022-02-17 DIAGNOSIS — I340 Nonrheumatic mitral (valve) insufficiency: Secondary | ICD-10-CM | POA: Diagnosis not present

## 2022-02-17 DIAGNOSIS — I10 Essential (primary) hypertension: Secondary | ICD-10-CM | POA: Diagnosis not present

## 2022-02-18 ENCOUNTER — Ambulatory Visit: Payer: Medicare PPO | Admitting: Family Medicine

## 2022-02-18 VITALS — BP 145/83 | HR 92 | Temp 97.8°F

## 2022-02-18 DIAGNOSIS — I5042 Chronic combined systolic (congestive) and diastolic (congestive) heart failure: Secondary | ICD-10-CM | POA: Diagnosis not present

## 2022-02-18 DIAGNOSIS — I10 Essential (primary) hypertension: Secondary | ICD-10-CM | POA: Diagnosis not present

## 2022-02-18 DIAGNOSIS — R319 Hematuria, unspecified: Secondary | ICD-10-CM | POA: Diagnosis not present

## 2022-02-18 DIAGNOSIS — E7849 Other hyperlipidemia: Secondary | ICD-10-CM

## 2022-02-18 DIAGNOSIS — I2510 Atherosclerotic heart disease of native coronary artery without angina pectoris: Secondary | ICD-10-CM | POA: Diagnosis not present

## 2022-02-18 DIAGNOSIS — E1121 Type 2 diabetes mellitus with diabetic nephropathy: Secondary | ICD-10-CM | POA: Diagnosis not present

## 2022-02-18 DIAGNOSIS — N390 Urinary tract infection, site not specified: Secondary | ICD-10-CM | POA: Diagnosis not present

## 2022-02-18 DIAGNOSIS — I4891 Unspecified atrial fibrillation: Secondary | ICD-10-CM | POA: Diagnosis not present

## 2022-02-18 LAB — POCT URINALYSIS DIPSTICK
Bilirubin, UA: NEGATIVE
Glucose, UA: POSITIVE — AB
Ketones, UA: NEGATIVE
Nitrite, UA: NEGATIVE
Protein, UA: POSITIVE — AB
Spec Grav, UA: 1.015 (ref 1.010–1.025)
Urobilinogen, UA: 0.2 E.U./dL
pH, UA: 5 (ref 5.0–8.0)

## 2022-02-18 MED ORDER — NITROFURANTOIN MONOHYD MACRO 100 MG PO CAPS: 100.0000 mg | ORAL_CAPSULE | Freq: Two times a day (BID) | ORAL | 0 refills | Status: DC

## 2022-02-18 NOTE — Progress Notes (Signed)
Established patient visit   Patient: Dawn Burnett   DOB: 1929-05-27   88 y.o. Female  MRN: [id number] Visit Date: 02/18/2022  Today's healthcare provider: [name], MD   No chief complaint on file.  Subjective    HPI  Patient is a 88 year old female who presents with her daughter for evaluation.  They state that the patient has had sudden onset of increased confusion, tremor, and weakness.  She has no new complaints of pain.  She has no increase in her already present incontinence, urgency and frequency of urination.   He has a few days of confusion recently and that is why the daughter thinks she has UTI. Had trouble coping with the ravages of aging for the past few years and is miserable most of the time. Medications: Outpatient Medications Prior to Visit  Medication Sig   acetaminophen (TYLENOL) 500 MG tablet Take 500 mg by mouth every 6 (six) hours as needed for mild pain, moderate pain, fever or headache.   atorvastatin (LIPITOR) 20 MG tablet Take 20 mg by mouth at bedtime.    blood glucose meter kit and supplies Dispense based on patient and insurance preference. Use once daily as directed. (FOR ICD-10 E11.21).   Calcium-Vitamin D 600-200 MG-UNIT per tablet Take 600 tablets by mouth daily with supper.    celecoxib (CELEBREX) 100 MG capsule TAKE 2 CAPSULES BY MOUTH DAILY AS NEEDED   digoxin (LANOXIN) 0.125 MG tablet Take 0.125 mg by mouth every other day.    ELIQUIS 2.5 MG TABS tablet Take 2.5 mg by mouth 2 (two) times daily.    ENTRESTO 97-103 MG Take 1 tablet by mouth 2 (two) times daily.    furosemide (LASIX) 20 MG tablet Take 20 mg by mouth daily.    gabapentin (NEURONTIN) 600 MG tablet TAKE 1 TABLET BY MOUTH 3 TIMES DAILY   imipramine (TOFRANIL) 25 MG tablet TAKE 1 TABLET BY MOUTH AT BEDTIME   loperamide (IMODIUM) 2 MG capsule Take 2 mg by mouth as needed.    Methylcellulose, Laxative, 500 MG TABS Take 2 tablets by mouth daily.   metoprolol succinate  (TOPROL-XL) 100 MG 24 hr tablet TAKE ONE TABLET BY MOUTH EVERY DAY   Multiple Vitamins-Minerals (CENTRUM SILVER ULTRA WOMENS PO) Take 1 tablet by mouth daily with lunch.    MYRBETRIQ 50 MG TB24 tablet TAKE 1 TABLET BY MOUTH DAILY   nystatin (NYAMYC) powder APPLY TOPICALLY FOUR TIMES A DAY   omeprazole (PRILOSEC) 20 MG capsule TAKE 1 CAPSULE BY MOUTH ONCE DAILY   Probiotic Product (ALIGN) 4 MG CAPS Take by mouth every other day.   spironolactone (ALDACTONE) 25 MG tablet TAKE 1 TABLET BY MOUTH DAILY   SYNTHROID 50 MCG tablet TAKE 1 TABLET BY MOUTH DAILY BEFORE BREAKFAST   No facility-administered medications prior to visit.    Review of Systems  Constitutional:  Negative for fever.  Gastrointestinal:  Negative for abdominal pain, diarrhea, nausea and vomiting.  Genitourinary:  Positive for enuresis, frequency and urgency. Negative for difficulty urinating, dysuria, flank pain and hematuria.       All reported to be chronic and not really changed  Neurological:  Positive for weakness.    Last hemoglobin A1c Lab Results  Component Value Date   HGBA1C 12.9 (H) 02/18/2022       Objective    BP (!) 145/83 (BP Location: Right Arm, Patient Position: Sitting, Cuff Size: Normal)   Pulse 92   Temp  97.8 F (36.6 C) (Oral)   SpO2 94%  BP Readings from Last 3 Encounters:  02/18/22 (!) 145/83  10/20/21 (!) 145/81  04/08/21 (!) 157/76   Wt Readings from Last 3 Encounters:  10/20/21 149 lb (67.6 kg)  04/08/21 141 lb (64 kg)  12/09/20 140 lb 9.6 oz (63.8 kg)      Physical Exam Vitals and nursing note reviewed.  Constitutional:      Appearance: Normal appearance. She is normal weight.     Comments: Elderly white female in no acute distress  HENT:     Head: Normocephalic and atraumatic.     Right Ear: External ear normal.     Left Ear: External ear normal.  Eyes:     General: No scleral icterus.    Conjunctiva/sclera: Conjunctivae normal.  Cardiovascular:     Rate and Rhythm:  Normal rate and regular rhythm.     Pulses: Normal pulses.     Heart sounds: Normal heart sounds.  Pulmonary:     Effort: Pulmonary effort is normal.     Breath sounds: Normal breath sounds.  Abdominal:     General: Bowel sounds are normal. There is no distension.     Palpations: Abdomen is soft. There is no mass.     Tenderness: There is no abdominal tenderness. There is no right CVA tenderness or left CVA tenderness.  Musculoskeletal:     Cervical back: Normal range of motion and neck supple.     Right lower leg: Edema present.     Left lower leg: Edema present.  Skin:    General: Skin is warm and dry.  Neurological:     General: No focal deficit present.     Mental Status: She is alert and oriented to person, place, and time.  Psychiatric:        Mood and Affect: Mood normal.        Behavior: Behavior normal.        Thought Content: Thought content normal.        Judgment: Judgment normal.       Results for orders placed or performed in visit on 02/18/22  POCT urinalysis dipstick  Result Value Ref Range   Color, UA yellow    Clarity, UA cloudy    Glucose, UA Positive (A) Negative   Bilirubin, UA neg    Ketones, UA neg    Spec Grav, UA 1.015 1.010 - 1.025   Blood, UA 2+    pH, UA 5.0 5.0 - 8.0   Protein, UA Positive (A) Negative   Urobilinogen, UA 0.2 0.2 or 1.0 E.U./dL   Nitrite, UA neg    Leukocytes, UA Large (3+) (A) Negative   Appearance     Odor      Assessment & Plan     1. Urinary tract infection with hematuria, site unspecified  - POCT urinalysis dipstick - Urine Culture - nitrofurantoin, macrocrystal-monohydrate, (MACROBID) 100 MG capsule; Take 1 capsule (100 mg total) by mouth 2 (two) times daily.  Dispense: 12 capsule; Refill: 0  2. Type 2 diabetes mellitus with diabetic nephropathy, without long-term current use of insulin (HCC) Goal A1c less than 3 in this 88 year old who is in failing health. - Hemoglobin A1c - Lipid panel - CBC  w/Diff/Platelet - Comprehensive Metabolic Panel (CMET) - TSH  3. Essential (primary) hypertension  - Hemoglobin A1c - Lipid panel - CBC w/Diff/Platelet - Comprehensive Metabolic Panel (CMET) - TSH  4. Other hyperlipidemia  - Hemoglobin A1c -  Lipid panel - CBC w/Diff/Platelet - Comprehensive Metabolic Panel (CMET) - TSH  5. Chronic combined systolic and diastolic congestive heart failure (HCC) Followed by cardiology - Hemoglobin A1c - Lipid panel - CBC w/Diff/Platelet - Comprehensive Metabolic Panel (CMET) - TSH  6. Atrial fibrillation, controlled (HCC)  - Hemoglobin A1c - Lipid panel - CBC w/Diff/Platelet - Comprehensive Metabolic Panel (CMET) - TSH  7. Arteriosclerosis of coronary artery All risk factors have been treated. - Hemoglobin A1c - Lipid panel - CBC w/Diff/Platelet - Comprehensive Metabolic Panel (CMET) - TSH   No follow-ups on file.      I, [name], MD, have reviewed all documentation for this visit. The documentation on 02/20/22 for the exam, diagnosis, procedures, and orders are all accurate and complete.    Kamilla Hands Cranford Mon, MD  [facility] [phone] (phone) [phone] (fax)  [place]

## 2022-02-19 DIAGNOSIS — R319 Hematuria, unspecified: Secondary | ICD-10-CM | POA: Diagnosis not present

## 2022-02-19 DIAGNOSIS — N390 Urinary tract infection, site not specified: Secondary | ICD-10-CM | POA: Diagnosis not present

## 2022-02-19 LAB — CBC WITH DIFFERENTIAL/PLATELET
Basophils Absolute: 0 10*3/uL (ref 0.0–0.2)
Basos: 0 %
EOS (ABSOLUTE): 0 10*3/uL (ref 0.0–0.4)
Eos: 0 %
Hematocrit: 39.9 % (ref 34.0–46.6)
Hemoglobin: 13 g/dL (ref 11.1–15.9)
Immature Grans (Abs): 0 10*3/uL (ref 0.0–0.1)
Immature Granulocytes: 0 %
Lymphocytes Absolute: 1.9 10*3/uL (ref 0.7–3.1)
Lymphs: 24 %
MCH: 33.4 pg — ABNORMAL HIGH (ref 26.6–33.0)
MCHC: 32.6 g/dL (ref 31.5–35.7)
MCV: 103 fL — ABNORMAL HIGH (ref 79–97)
Monocytes Absolute: 0.6 10*3/uL (ref 0.1–0.9)
Monocytes: 7 %
Neutrophils Absolute: 5.3 10*3/uL (ref 1.4–7.0)
Neutrophils: 69 %
Platelets: 126 10*3/uL — ABNORMAL LOW (ref 150–450)
RBC: 3.89 x10E6/uL (ref 3.77–5.28)
RDW: 12.2 % (ref 11.7–15.4)
WBC: 7.8 10*3/uL (ref 3.4–10.8)

## 2022-02-19 LAB — LIPID PANEL
Chol/HDL Ratio: 4.4 ratio (ref 0.0–4.4)
Cholesterol, Total: 173 mg/dL (ref 100–199)
HDL: 39 mg/dL — ABNORMAL LOW (ref >39)
LDL Chol Calc (NIH): 70 mg/dL (ref 0–99)
Triglycerides: 408 mg/dL — ABNORMAL HIGH (ref 0–149)
VLDL Cholesterol Cal: 64 mg/dL — ABNORMAL HIGH (ref 5–40)

## 2022-02-19 LAB — COMPREHENSIVE METABOLIC PANEL
ALT: 11 IU/L (ref 0–32)
AST: 17 IU/L (ref 0–40)
Albumin/Globulin Ratio: 1.6 (ref 1.2–2.2)
Albumin: 4.1 g/dL (ref 3.6–4.6)
Alkaline Phosphatase: 70 IU/L (ref 44–121)
BUN/Creatinine Ratio: 22 (ref 12–28)
BUN: 32 mg/dL (ref 10–36)
Bilirubin Total: 0.4 mg/dL (ref 0.0–1.2)
CO2: 23 mmol/L (ref 20–29)
Calcium: 9.5 mg/dL (ref 8.7–10.3)
Chloride: 94 mmol/L — ABNORMAL LOW (ref 96–106)
Creatinine, Ser: 1.43 mg/dL — ABNORMAL HIGH (ref 0.57–1.00)
Globulin, Total: 2.6 g/dL (ref 1.5–4.5)
Glucose: 490 mg/dL — ABNORMAL HIGH (ref 70–99)
Potassium: 5.1 mmol/L (ref 3.5–5.2)
Sodium: 134 mmol/L (ref 134–144)
Total Protein: 6.7 g/dL (ref 6.0–8.5)
eGFR: 35 mL/min/{1.73_m2} — ABNORMAL LOW (ref >59)

## 2022-02-19 LAB — TSH: TSH: 1.46 u[IU]/mL (ref 0.450–4.500)

## 2022-02-19 LAB — HEMOGLOBIN A1C
Est. average glucose Bld gHb Est-mCnc: 324 mg/dL
Hgb A1c MFr Bld: 12.9 % — ABNORMAL HIGH (ref 4.8–5.6)

## 2022-02-20 ENCOUNTER — Ambulatory Visit: Payer: Self-pay | Admitting: *Deleted

## 2022-02-20 ENCOUNTER — Other Ambulatory Visit: Payer: Self-pay | Admitting: *Deleted

## 2022-02-20 DIAGNOSIS — E1121 Type 2 diabetes mellitus with diabetic nephropathy: Secondary | ICD-10-CM

## 2022-02-20 MED ORDER — GLIPIZIDE 5 MG PO TABS: 5.0000 mg | ORAL_TABLET | Freq: Every day | ORAL | 5 refills | Status: DC

## 2022-02-20 NOTE — Telephone Encounter (Signed)
Please advise 

## 2022-02-20 NOTE — Telephone Encounter (Signed)
Summary: Pharmacy concern on medication   Judeen Hammans from the pharmacy stated pt was prescribed the medication glipiZIDE (GLUCOTROL) 5 MG tablet. However, pt mentioned she is allergic to Sulfa drugs, and the pt is concerned about taking this medication.    Call back - [phone]    Please advise.      Judeen Hammans unavailable but reviewed with another pharmacist and explained I would pass on to Dr. Caryn Section for him to decide or speak with pt.  Reason for Disposition  [1] Caller has URGENT medicine question about med that PCP or specialist prescribed AND [2] triager unable to answer question  Answer Assessment - Initial Assessment Questions 1. REASON FOR CALL or QUESTION: "What is your reason for calling today?" or "How can I best help you?" or "What question do you have that I can help answer?"     Pharmacy states pt allergic to sulfa and does not feel comfortable taking Glipizide.  Protocols used: Information Only Call - No Triage-A-AH, Medication Question Call-A-AH

## 2022-02-23 ENCOUNTER — Ambulatory Visit: Payer: Medicare PPO | Admitting: Family Medicine

## 2022-02-23 ENCOUNTER — Other Ambulatory Visit: Payer: Self-pay | Admitting: *Deleted

## 2022-02-23 ENCOUNTER — Encounter: Payer: Self-pay | Admitting: Family Medicine

## 2022-02-23 VITALS — BP 179/71 | HR 70

## 2022-02-23 DIAGNOSIS — I10 Essential (primary) hypertension: Secondary | ICD-10-CM

## 2022-02-23 DIAGNOSIS — E1121 Type 2 diabetes mellitus with diabetic nephropathy: Secondary | ICD-10-CM

## 2022-02-23 DIAGNOSIS — N3020 Other chronic cystitis without hematuria: Secondary | ICD-10-CM | POA: Diagnosis not present

## 2022-02-23 DIAGNOSIS — I5042 Chronic combined systolic (congestive) and diastolic (congestive) heart failure: Secondary | ICD-10-CM

## 2022-02-23 DIAGNOSIS — C49A2 Gastrointestinal stromal tumor of stomach: Secondary | ICD-10-CM

## 2022-02-23 DIAGNOSIS — E7849 Other hyperlipidemia: Secondary | ICD-10-CM | POA: Diagnosis not present

## 2022-02-23 DIAGNOSIS — I4891 Unspecified atrial fibrillation: Secondary | ICD-10-CM | POA: Diagnosis not present

## 2022-02-23 MED ORDER — SITAGLIPTIN PHOSPHATE 100 MG PO TABS: 100.0000 mg | ORAL_TABLET | ORAL | 2 refills | Status: DC

## 2022-02-23 MED ORDER — FREESTYLE LIBRE 2 READER DEVI: 1.0000 | Freq: Once | Status: DC

## 2022-02-23 MED ORDER — FREESTYLE LIBRE 2 READER DEVI: 1.0000 | Freq: Once | 0 refills | Status: AC

## 2022-02-23 MED ORDER — FREESTYLE LIBRE 2 SENSOR MISC: 1.0000 | Freq: Once | 0 refills | Status: AC

## 2022-02-23 NOTE — Telephone Encounter (Signed)
Sent Januvia and canceled glipizide.

## 2022-02-23 NOTE — Progress Notes (Signed)
Established patient visit  I,[name],acting as a scribe for [name], MD.,have documented all relevant documentation on the behalf of [name], MD,as directed by  [name], MD while in the presence of [name], MD.   Patient: Dawn Burnett   DOB: 05-03-1929   88 y.o. Female  MRN: [id number] Visit Date: 02/23/2022  Today's healthcare provider: [name], MD   No chief complaint on file.  Subjective    HPI  Patient is here to discuss labs results from 02/18/2022. Her urine symptoms have resolved but her A1c was up from 8.8-12.9 which is a use jump. Patient again is brought in by her daughter and does not feel well.  Nothing specific.  She was on diet controlled diabetes before and is not a metformin candidate and has a sulfa allergy so sulfonylureas were not a great option so she was started on Januvia as of today.  Insulin is probably the best option but I think it would be a terrible option for her because of the risk of hypoglycemia and she is not excited at all about getting insulin. Medications: Outpatient Medications Prior to Visit  Medication Sig   acetaminophen (TYLENOL) 500 MG tablet Take 500 mg by mouth every 6 (six) hours as needed for mild pain, moderate pain, fever or headache.   atorvastatin (LIPITOR) 20 MG tablet Take 20 mg by mouth at bedtime.    blood glucose meter kit and supplies Dispense based on patient and insurance preference. Use once daily as directed. (FOR ICD-10 E11.21).   Calcium-Vitamin D 600-200 MG-UNIT per tablet Take 600 tablets by mouth daily with supper.    celecoxib (CELEBREX) 100 MG capsule TAKE 2 CAPSULES BY MOUTH DAILY AS NEEDED   digoxin (LANOXIN) 0.125 MG tablet Take 0.125 mg by mouth every other day.    ELIQUIS 2.5 MG TABS tablet Take 2.5 mg by mouth 2 (two) times daily.    ENTRESTO 97-103 MG Take 1 tablet by mouth 2 (two) times daily.    furosemide (LASIX) 20 MG tablet Take 20 mg by mouth  daily.    gabapentin (NEURONTIN) 600 MG tablet TAKE 1 TABLET BY MOUTH 3 TIMES DAILY   imipramine (TOFRANIL) 25 MG tablet TAKE 1 TABLET BY MOUTH AT BEDTIME   loperamide (IMODIUM) 2 MG capsule Take 2 mg by mouth as needed.    Methylcellulose, Laxative, 500 MG TABS Take 2 tablets by mouth daily.   metoprolol succinate (TOPROL-XL) 100 MG 24 hr tablet TAKE ONE TABLET BY MOUTH EVERY DAY   Multiple Vitamins-Minerals (CENTRUM SILVER ULTRA WOMENS PO) Take 1 tablet by mouth daily with lunch.    MYRBETRIQ 50 MG TB24 tablet TAKE 1 TABLET BY MOUTH DAILY   nitrofurantoin, macrocrystal-monohydrate, (MACROBID) 100 MG capsule Take 1 capsule (100 mg total) by mouth 2 (two) times daily.   nystatin (NYAMYC) powder APPLY TOPICALLY FOUR TIMES A DAY   omeprazole (PRILOSEC) 20 MG capsule TAKE 1 CAPSULE BY MOUTH ONCE DAILY   Probiotic Product (ALIGN) 4 MG CAPS Take by mouth every other day.   sitaGLIPtin (JANUVIA) 100 MG tablet Take 1 tablet (100 mg total) by mouth every morning.   spironolactone (ALDACTONE) 25 MG tablet TAKE 1 TABLET BY MOUTH DAILY   SYNTHROID 50 MCG tablet TAKE 1 TABLET BY MOUTH DAILY BEFORE BREAKFAST   No facility-administered medications prior to visit.    Review of Systems  Constitutional:  Negative for appetite change, chills, fatigue and  fever.  Respiratory:  Negative for chest tightness and shortness of breath.   Cardiovascular:  Negative for chest pain and palpitations.  Gastrointestinal:  Negative for abdominal pain, nausea and vomiting.  Neurological:  Negative for dizziness and weakness.    Last hemoglobin A1c Lab Results  Component Value Date   HGBA1C 12.9 (H) 02/18/2022       Objective    BP (!) 179/71 (BP Location: Right Arm, Patient Position: Sitting, Cuff Size: Normal)   Pulse 70   SpO2 98%  BP Readings from Last 3 Encounters:  02/23/22 (!) 179/71  02/18/22 (!) 145/83  10/20/21 (!) 145/81   Wt Readings from Last 3 Encounters:  10/20/21 149 lb (67.6 kg)  04/08/21  141 lb (64 kg)  12/09/20 140 lb 9.6 oz (63.8 kg)      Physical Exam Vitals reviewed.  Constitutional:      General: She is not in acute distress.    Appearance: She is well-developed.     Comments: Elderly appearing woman in no acute distress in a wheelchair  HENT:     Head: Normocephalic and atraumatic.     Right Ear: Hearing normal.     Left Ear: Hearing normal.     Nose: Nose normal.  Eyes:     General: Lids are normal. No scleral icterus.       Right eye: No discharge.        Left eye: No discharge.     Conjunctiva/sclera: Conjunctivae normal.  Cardiovascular:     Rate and Rhythm: Normal rate and regular rhythm.     Heart sounds: Normal heart sounds.  Pulmonary:     Effort: Pulmonary effort is normal. No respiratory distress.  Skin:    Findings: No lesion or rash.  Neurological:     General: No focal deficit present.     Mental Status: She is alert and oriented to person, place, and time.  Psychiatric:        Mood and Affect: Mood normal.        Speech: Speech normal.        Behavior: Behavior normal.        Thought Content: Thought content normal.        Judgment: Judgment normal.       No results found for any visits on 02/23/22.  Assessment & Plan     1. Type 2 diabetes mellitus with diabetic nephropathy, without long-term current use of insulin ([place]) He is started on Januvia 100 mg with a goal just to get her A1c below 10.  She is not a pioglitazone candidate because of history of heart failure. Wilder Glade may very well be a good option if she can afford it with her insurance - Continuous Blood Gluc Sensor (FREESTYLE LIBRE 2 SENSOR) MISC; 1 each by Does not apply route once for 1 dose.  Dispense: 1 each; Refill: 0 - Continuous Blood Gluc Receiver (FREESTYLE LIBRE 2 READER) DEVI; 1 each by Does not apply route once for 1 dose.  Dispense: 1 each; Refill: 0  2. Essential (primary) hypertension   3. Atrial fibrillation, controlled ([place]) On lifelong  Coumadin  4. Chronic combined systolic and diastolic congestive heart failure (HCC) No TCDs  5. 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 Bladder infection, chronic   7. Other hyperlipidemia    No follow-ups on file.      I, [name], MD, have reviewed all documentation for this visit. The documentation on 02/24/22  for the exam, diagnosis, procedures, and orders are all accurate and complete.    Maurissa Ambrose Cranford Mon, MD  [facility] [phone] (phone) [phone] (fax)  [place]

## 2022-02-24 LAB — URINE CULTURE

## 2022-02-24 LAB — SPECIMEN STATUS REPORT

## 2022-03-09 ENCOUNTER — Telehealth: Payer: Self-pay | Admitting: Family Medicine

## 2022-03-09 NOTE — Telephone Encounter (Signed)
Copied from [place] [id number]. Topic: Medicare AWV >> Mar 09, 2022  3:27 PM Jae Dire wrote: Reason for CRM:  No answer unable to leave a  message for patient to call back and schedule Medicare Annual Wellness Visit (AWV) in office.   If unable to come into the office for AWV,  please offer to do virtually or by telephone.  Last AWV: 01/24/2020  Please schedule at anytime with [facility] Health Advisor.  30 minute appointment for Virtual or phone 45 minute appointment for in office or Initial virtual/phone  Any questions, please contact me at [phone]

## 2022-03-22 ENCOUNTER — Other Ambulatory Visit: Payer: Self-pay | Admitting: Family Medicine

## 2022-03-23 MED ORDER — NYSTATIN 100000 UNIT/GM EX POWD
CUTANEOUS | 0 refills | Status: DC
Start: 2022-03-23 — End: 2022-08-30

## 2022-03-24 ENCOUNTER — Telehealth: Payer: Self-pay | Admitting: Family Medicine

## 2022-03-24 NOTE — Telephone Encounter (Signed)
Copied from [place] [id number]. Topic: Medicare AWV >> Mar 24, 2022 11:54 AM Jae Dire wrote: Reason for CRM:  No answer unable to leave a message for patient to call back and schedule Medicare Annual Wellness Visit (AWV) in office.   If unable to come into the office for AWV,  please offer to do virtually or by telephone.  Last AWV: 01/24/2020  Please schedule at anytime with [facility] Health Advisor.  30 minute appointment for Virtual or phone 45 minute appointment for in office or Initial virtual/phone  Any questions, please contact me at [phone]

## 2022-03-30 ENCOUNTER — Other Ambulatory Visit: Payer: Self-pay | Admitting: Family Medicine

## 2022-03-30 ENCOUNTER — Other Ambulatory Visit: Payer: Self-pay | Admitting: *Deleted

## 2022-03-30 DIAGNOSIS — E1121 Type 2 diabetes mellitus with diabetic nephropathy: Secondary | ICD-10-CM

## 2022-03-30 DIAGNOSIS — I5042 Chronic combined systolic (congestive) and diastolic (congestive) heart failure: Secondary | ICD-10-CM

## 2022-04-02 ENCOUNTER — Telehealth: Payer: Self-pay

## 2022-04-02 NOTE — Progress Notes (Signed)
Chronic Care Management Pharmacy Assistant   Name: Dawn Burnett  MRN: [id number] DOB: [birth year]  Chart Review for the Clinical pharmacist on 04/06/2022 at 9:30 am.  Conditions to be addressed/monitored: Atrial Fibrillation, CHF, HTN, HLD, DMII, Hypothyroidism, Allergic Rhinitis, Osteopenia, and Acid reflux, Neuropathy, Basal Cell Carcinoma of ear, Arthritis, SCC,Urinary incontinence   Primary concerns for visit include: Over all health.   Recent office visits:  02/23/2022 Dr. Sullivan Lone MD (PCP)No Medication Changes noted 02/20/2022 Dr. Sullivan Lone MD (PCP) Stop Harrietta Guardian and start Januvia 100 mg daily 02/18/2022 Dr. Sullivan Lone MD (PCP) start , MACROBID 100 MG capsule 2 times daily 10/20/2021 Dr. Sullivan Lone MD (PCP) No Medication Changes noted, follow up in 6 months  Recent consult visits:  12/30/2021 Gwyneth Revels DPM (Podiatry) No Medication Changes noted 10/16/2021 Gwyneth Revels DPM (Podiatry) No Medication Changes noted  Hospital visits:  None in previous 6 months  Questions for Clinical Pharmacist:   1.Are you able to connect with Patient yes     2.Confirmed appointment date/time with patient/caregiver? Confirm appointment on 04/06/2022 at 9:30 am with Julious Payer CPP      3.Visit type telephone     4.Patient/Caregiver instructed to bring medications to appointment. Patient is aware to bring all medications and supplements to appointment    5.What, if any, problems do you have getting your medications from the pharmacy? None, Patient daughter denies any issue getting the patient medications.     6.What is your top health concern to discuss at your upcoming visit?  Patient daughter denies any health concerns at this time.   7.Have you seen any other providers since your last visit? No   Medications: Outpatient Encounter Medications as of 04/02/2022  Medication Sig   acetaminophen (TYLENOL) 500 MG tablet Take 500 mg by mouth every 6 (six) hours as needed for mild  pain, moderate pain, fever or headache.   atorvastatin (LIPITOR) 20 MG tablet Take 20 mg by mouth at bedtime.    blood glucose meter kit and supplies Dispense based on patient and insurance preference. Use once daily as directed. (FOR ICD-10 E11.21).   Calcium-Vitamin D 600-200 MG-UNIT per tablet Take 600 tablets by mouth daily with supper.    celecoxib (CELEBREX) 100 MG capsule TAKE 2 CAPSULES BY MOUTH DAILY AS NEEDED   digoxin (LANOXIN) 0.125 MG tablet Take 0.125 mg by mouth every other day.    ELIQUIS 2.5 MG TABS tablet Take 2.5 mg by mouth 2 (two) times daily.    ENTRESTO 97-103 MG Take 1 tablet by mouth 2 (two) times daily.    furosemide (LASIX) 20 MG tablet Take 20 mg by mouth daily.    gabapentin (NEURONTIN) 600 MG tablet TAKE 1 TABLET BY MOUTH 3 TIMES DAILY   imipramine (TOFRANIL) 25 MG tablet TAKE 1 TABLET BY MOUTH AT BEDTIME   loperamide (IMODIUM) 2 MG capsule Take 2 mg by mouth as needed.    Methylcellulose, Laxative, 500 MG TABS Take 2 tablets by mouth daily.   metoprolol succinate (TOPROL-XL) 100 MG 24 hr tablet TAKE ONE TABLET BY MOUTH EVERY DAY   Multiple Vitamins-Minerals (CENTRUM SILVER ULTRA WOMENS PO) Take 1 tablet by mouth daily with lunch.    MYRBETRIQ 50 MG TB24 tablet TAKE 1 TABLET BY MOUTH DAILY   nitrofurantoin, macrocrystal-monohydrate, (MACROBID) 100 MG capsule Take 1 capsule (100 mg total) by mouth 2 (two) times daily.   nystatin [facility]) powder APPLY TOPICALLY FOUR TIMES A DAY   omeprazole (PRILOSEC) 20 MG capsule TAKE 1  CAPSULE BY MOUTH ONCE DAILY   Probiotic Product (ALIGN) 4 MG CAPS Take by mouth every other day.   sitaGLIPtin (JANUVIA) 100 MG tablet Take 1 tablet (100 mg total) by mouth every morning.   spironolactone (ALDACTONE) 25 MG tablet TAKE 1 TABLET BY MOUTH DAILY   SYNTHROID 50 MCG tablet TAKE 1 TABLET BY MOUTH DAILY BEFORE BREAKFAST   No facility-administered encounter medications on file as of 04/02/2022.    Care Gaps: COVID-19 Vaccine Shingrix  Vaccine DEXA Scan Ophthalmology Exam Influenza Vaccine HTN: 179/71 - 02/23/2022 A1C- 12.9%- 02/18/2022  Star Rating Drugs: Atorvastatin 20 mg last filled on 02/09/2022 for 90 day supply at [place]. Januvia 100 mg last filled on 03/23/2022 for 30 day supply at [place]. Entresto 97-103 last filled on 03/02/2022 for 90 day supply at [place].  [facility] Pharmacist Assistant [phone]

## 2022-04-02 NOTE — Chronic Care Management (AMB) (Signed)
  Chronic Care Management   Note  04/02/2022 Name: DECLYN OFFIELD MRN: [id number] DOB: 1928-11-19  JANEECE BLOK is a 88 y.o. year old female who is a primary care patient of Jerrol Banana., MD. I reached out to Richrd Humbles by phone today in response to a referral sent by Ms. Noonday E Dutter's PCP.  Ms. Genson was given information about Chronic Care Management services today including:  CCM service includes personalized support from designated clinical staff supervised by her physician, including individualized plan of care and coordination with other care providers 24/7 contact phone numbers for assistance for urgent and routine care needs. Service will only be billed when office clinical staff spend 20 minutes or more in a month to coordinate care. Only one practitioner may furnish and bill the service in a calendar month. The patient may stop CCM services at any time (effective at the end of the month) by phone call to the office staff. The patient is responsible for co-pay (up to 20% after annual deductible is met) if co-pay is required by the individual health plan.   Patient agreed to services and verbal consent obtained.   Follow up plan: Telephone appointment with care management team member scheduled for:04/06/2022  Noreene Larsson, [place], [place] [address] Direct Dial: [phone] [email or web address]

## 2022-04-06 ENCOUNTER — Ambulatory Visit (INDEPENDENT_AMBULATORY_CARE_PROVIDER_SITE_OTHER): Payer: Medicare PPO

## 2022-04-06 DIAGNOSIS — E1121 Type 2 diabetes mellitus with diabetic nephropathy: Secondary | ICD-10-CM

## 2022-04-06 NOTE — Progress Notes (Signed)
Chronic Care Management Pharmacy Note  04/06/2022 Name:  Dawn Burnett MRN:  [id number] DOB:  [birth year]  Summary: Patient presents for initial consult.   Recommendations/Changes made from today's visit: Continue current medications  Plan: CPP follow-up 3 months  Subjective: Dawn Burnett is an 88 y.o. year old female who is a primary patient of Jerrol Banana., MD.  The CCM team was consulted for assistance with disease management and care coordination needs.    Engaged with patient by telephone for initial visit in response to provider referral for pharmacy case management and/or care coordination services.   Consent to Services:  The patient was given the following information about Chronic Care Management services today, agreed to services, and gave verbal consent: 1. CCM service includes personalized support from designated clinical staff supervised by the primary care provider, including individualized plan of care and coordination with other care providers 2. 24/7 contact phone numbers for assistance for urgent and routine care needs. 3. Service will only be billed when office clinical staff spend 20 minutes or more in a month to coordinate care. 4. Only one practitioner may furnish and bill the service in a calendar month. 5.The patient may stop CCM services at any time (effective at the end of the month) by phone call to the office staff. 6. The patient will be responsible for cost sharing (co-pay) of up to 20% of the service fee (after annual deductible is met). Patient agreed to services and consent obtained.  Patient Care Team: Jerrol Banana., MD as PCP - General (Family Medicine) Oneta Rack, MD as Consulting Physician (Dermatology) Dionisio David, MD as Consulting Physician (Cardiology) Samara Deist, DPM as Referring Physician (Podiatry) Jerrol Banana., MD (Family Medicine) Germaine Pomfret, [facility] (Pharmacist)  Recent office  visits: 02/23/2022 Dr. Rosanna Randy MD (PCP)No Medication Changes noted 02/20/2022 Dr. Rosanna Randy MD (PCP) Stop Carollee Massed and start Januvia 100 mg daily 02/18/2022 Dr. Rosanna Randy MD (PCP) start , MACROBID 100 MG capsule 2 times daily 10/20/2021 Dr. Rosanna Randy MD (PCP) No Medication Changes noted, follow up in 6 months  Recent consult visits: 12/30/2021 Samara Deist DPM (Podiatry) No Medication Changes noted 10/16/2021 Samara Deist DPM (Podiatry) No Medication Changes noted  Hospital visits: None in previous 6 months   Objective:  Lab Results  Component Value Date   CREATININE 1.43 (H) 02/18/2022   BUN 32 02/18/2022   EGFR 35 (L) 02/18/2022   GFRNONAA 52 (L) 10/30/2019   GFRAA 60 10/30/2019   NA 134 02/18/2022   K 5.1 02/18/2022   CALCIUM 9.5 02/18/2022   CO2 23 02/18/2022   GLUCOSE 490 (H) 02/18/2022    Lab Results  Component Value Date/Time   HGBA1C 12.9 (H) 02/18/2022 03:46 PM   HGBA1C 8.8 (A) 10/20/2021 05:09 PM   HGBA1C 7.8 (H) 04/08/2021 11:18 AM   MICROALBUR 100 01/21/2015 11:26 AM    Last diabetic Eye exam:  Lab Results  Component Value Date/Time   HMDIABEYEEXA No Retinopathy 05/03/2020 12:00 AM    Last diabetic Foot exam:  Lab Results  Component Value Date/Time   HMDIABFOOTEX decreased sensation 04/18/2014 12:00 AM     Lab Results  Component Value Date   CHOL 173 02/18/2022   HDL 39 (L) 02/18/2022   LDLCALC 70 02/18/2022   TRIG 408 (H) 02/18/2022   CHOLHDL 4.4 02/18/2022       Latest Ref Rng & Units 02/18/2022    3:46 PM 04/08/2021   11:18 AM 10/30/2019  4:49 PM  Hepatic Function  Total Protein 6.0 - 8.5 g/dL 6.7  6.2  6.1   Albumin 3.6 - 4.6 g/dL 4.1  4.1  3.9   AST 0 - 40 IU/L _0 ALT 0 - 32 IU/L _1 Alk Phosphatase 44 - 121 IU/L 70  55  68   Total Bilirubin 0.0 - 1.2 mg/dL 0.4  0.6  0.8     Lab Results  Component Value Date/Time   TSH 1.460 02/18/2022 03:46 PM   TSH 1.350 04/08/2021 11:18 AM       Latest Ref Rng & Units  02/18/2022    3:46 PM 04/08/2021   11:18 AM 10/30/2019    4:49 PM  CBC  WBC 3.4 - 10.8 x10E3/uL 7.8  6.9  6.4   Hemoglobin 11.1 - 15.9 g/dL 13.0  12.3  13.7   Hematocrit 34.0 - 46.6 % 39.9  36.9  39.8   Platelets 150 - 450 x10E3/uL 126  148  129     No results found for: "VD25OH"  Clinical ASCVD: No  The ASCVD Risk score (Arnett DK, et al., 2019) failed to calculate for the following reasons:   The 2019 ASCVD risk score is only valid for ages 30 to 67       09/09/2020   11:46 AM 01/03/2019   10:53 AM 12/29/2017    9:38 AM  Depression screen PHQ 2/9  Decreased Interest 2 0 0  Down, Depressed, Hopeless 2 0 0  PHQ - 2 Score 4 0 0  Altered sleeping 1  0  Tired, decreased energy 3  1  Change in appetite 2  1  Feeling bad or failure about yourself  0  0  Trouble concentrating 1  0  Moving slowly or fidgety/restless 0  0  Suicidal thoughts 0  0  PHQ-9 Score 11  2  Difficult doing work/chores Very difficult  Not difficult at all     Social History   Tobacco Use  Smoking Status Never  Smokeless Tobacco Never   BP Readings from Last 3 Encounters:  02/23/22 (!) 179/71  02/18/22 (!) 145/83  10/20/21 (!) 145/81   Pulse Readings from Last 3 Encounters:  02/23/22 70  02/18/22 92  10/20/21 70   Wt Readings from Last 3 Encounters:  10/20/21 149 lb (67.6 kg)  04/08/21 141 lb (64 kg)  12/09/20 140 lb 9.6 oz (63.8 kg)   BMI Readings from Last 3 Encounters:  10/20/21 23.34 kg/m  04/08/21 22.08 kg/m  12/09/20 22.02 kg/m    Assessment/Interventions: Review of patient past medical history, allergies, medications, health status, including review of consultants reports, laboratory and other test data, was performed as part of comprehensive evaluation and provision of chronic care management services.   SDOH:  (Social Determinants of Health) assessments and interventions performed: Yes  SDOH Screenings   Alcohol Screen: Low Risk  (09/09/2020)   Alcohol Screen    Last Alcohol  Screening Score (AUDIT): 0  Depression (PHQ2-9): Medium Risk (09/09/2020)   Depression (PHQ2-9)    PHQ-2 Score: 11  Financial Resource Strain: Low Risk  (01/24/2020)   Overall Financial Resource Strain (CARDIA)    Difficulty of Paying Living Expenses: Not hard at all  Food Insecurity: No Food Insecurity (01/24/2020)   Hunger Vital Sign    Worried About Running Out of Food in the Last Year: Never true    Ran Out of Food in the  Last Year: Never true  Housing: Low Risk  (01/24/2020)   Housing    Last Housing Risk Score: 0  Physical Activity: Inactive (01/24/2020)   Exercise Vital Sign    Days of Exercise per Week: 0 days    Minutes of Exercise per Session: 0 min  Social Connections: Socially Isolated (01/24/2020)   Social Connection and Isolation Panel [NHANES]    Frequency of Communication with Friends and Family: More than three times a week    Frequency of Social Gatherings with Friends and Family: More than three times a week    Attends Religious Services: Never    Marine scientist or Organizations: No    Attends Archivist Meetings: Never    Marital Status: Widowed  Stress: No Stress Concern Present (01/24/2020)   [place]    Feeling of Stress : Only a little  Tobacco Use: Low Risk  (02/23/2022)   Patient History    Smoking Tobacco Use: Never    Smokeless Tobacco Use: Never    Passive Exposure: Not on file  Transportation Needs: No Transportation Needs (01/24/2020)   PRAPARE - Transportation    Lack of Transportation (Medical): No    Lack of Transportation (Non-Medical): No    CCM Care Plan  Allergies  Allergen Reactions   Iodinated Contrast Media Rash   Augmentin [Amoxicillin-Pot Clavulanate] Other (See Comments)    Gi distress    Codeine    Crestor [Rosuvastatin] Other (See Comments)    Unknown Unknown   Levaquin [Levofloxacin In D5w]     Stomach pain, body pain/ache all over Stomach  pain, body pain/ache all over   Naproxen Other (See Comments)    unknown unknown   Prednisone    Sulfa Antibiotics    Sulfa Antibiotics Other (See Comments)   Sulfacetamide Sodium    Tolectin [Tolmetin]     Medications Reviewed Today     Reviewed by Julieta Bellini, CMA (Certified Medical Assistant) on 02/23/22 at 1540  Med List Status: <None>   Medication Order Taking? Sig Documenting Provider Last Dose Status Informant  acetaminophen (TYLENOL) 500 MG tablet [id number] Yes Take 500 mg by mouth every 6 (six) hours as needed for mild pain, moderate pain, fever or headache. [provider] Taking Active Self  atorvastatin (LIPITOR) 20 MG tablet [id number] Yes Take 20 mg by mouth at bedtime.  [provider] Taking Active Self           Med Note Sylvan Cheese Sep 26, 2015  5:38 PM)    blood glucose meter kit and supplies [id number] Yes Dispense based on patient and insurance preference. Use once daily as directed. (FOR ICD-10 E11.21). Jerrol Banana., MD Taking Active   Calcium-Vitamin D 600-200 MG-UNIT per tablet [id number] Yes Take 600 tablets by mouth daily with supper.  [provider] Taking Active Self           Med Note Sylvan Cheese Sep 26, 2015  5:38 PM)    celecoxib (CELEBREX) 100 MG capsule [id number] Yes TAKE 2 CAPSULES BY MOUTH DAILY AS NEEDED Jerrol Banana., MD Taking Active   digoxin (LANOXIN) 0.125 MG tablet [id number] Yes Take 0.125 mg by mouth every other day.  [provider] Taking Active   ELIQUIS 2.5 MG TABS tablet [id number] Yes Take 2.5 mg by mouth 2 (two) times daily.  [provider] Taking Active  ENTRESTO 97-103 MG [id number] Yes Take 1 tablet by mouth 2 (two) times daily.  [provider] Taking Active   furosemide (LASIX) 20 MG tablet [id number] Yes Take 20 mg by mouth daily.  [provider] Taking Active Self           Med Note Sylvan Cheese Sep 26, 2015  5:38 PM)     gabapentin (NEURONTIN) 600 MG tablet [id number] Yes TAKE 1 TABLET BY MOUTH 3 TIMES DAILY Jerrol Banana., MD Taking Active   imipramine (TOFRANIL) 25 MG tablet [id number] Yes TAKE 1 TABLET BY MOUTH AT BEDTIME Jerrol Banana., MD Taking Active   loperamide (IMODIUM) 2 MG capsule [id number] Yes Take 2 mg by mouth as needed.  [provider] Taking Active   Methylcellulose, Laxative, 500 MG TABS [id number] Yes Take 2 tablets by mouth daily. [provider] Taking Active   metoprolol succinate (TOPROL-XL) 100 MG 24 hr tablet [id number] Yes TAKE ONE TABLET BY MOUTH EVERY DAY Jerrol Banana., MD Taking Active   Multiple Vitamins-Minerals (CENTRUM SILVER ULTRA WOMENS PO) [id number] Yes Take 1 tablet by mouth daily with lunch.  [provider] Taking Active Self  MYRBETRIQ 50 MG TB24 tablet [id number] Yes TAKE 1 TABLET BY MOUTH DAILY Jerrol Banana., MD Taking Active   nitrofurantoin, macrocrystal-monohydrate, (MACROBID) 100 MG capsule [id number] Yes Take 1 capsule (100 mg total) by mouth 2 (two) times daily. Jerrol Banana., MD Taking Active   nystatin [facility]) powder [id number] Yes APPLY TOPICALLY FOUR TIMES A DAY Jerrol Banana., MD Taking Active   omeprazole [facility]) 20 MG capsule [id number] Yes TAKE 1 CAPSULE BY MOUTH ONCE DAILY Jerrol Banana., MD Taking Active   Probiotic Product (ALIGN) 4 MG CAPS [id number] Yes Take by mouth every other day. [provider] Taking Active   sitaGLIPtin (JANUVIA) 100 MG tablet [id number] Yes Take 1 tablet (100 mg total) by mouth every morning. Jerrol Banana., MD Taking Active   spironolactone (ALDACTONE) 25 MG tablet [id number] Yes TAKE 1 TABLET BY MOUTH DAILY Jerrol Banana., MD Taking Active   SYNTHROID 57 MCG tablet [id number] Yes TAKE 1 TABLET BY MOUTH DAILY BEFORE BREAKFAST Jerrol Banana., MD Taking Active             Patient Active Problem List   Diagnosis Date  Noted   Chronic CHF (congestive heart failure) ([place]) 12/25/2017   Incontinence of feces with fecal urgency 09/06/2017   Osteoarthritis of knee 03/03/2017   Type 2 diabetes mellitus with diabetic nephropathy, without long-term current use of insulin ([place]) 09/01/2016   Gastrointestinal stromal tumor (GIST) of stomach ([place]) 12/21/2014   Allergic rhinitis 12/07/2014   Arthritis 12/07/2014   Atrial fibrillation, controlled ([place]) 12/07/2014   Arteriosclerosis of coronary artery 12/07/2014   Essential (primary) hypertension 12/07/2014   Acid reflux 12/07/2014   History of prolonged Q-T interval on ECG 12/07/2014   Blood glucose elevated 12/07/2014   Adult hypothyroidism 12/07/2014   Urinary incontinence 12/07/2014   Neuropathy 12/07/2014   Osteopenia 12/07/2014   HLD (hyperlipidemia) 12/07/2014   Alteration in bowel elimination: incontinence 12/07/2014   Hiatal hernia    Atrial fibrillation (HCC)    Basal cell carcinoma of ear 11/14/2014   Bladder infection, chronic 06/01/2012   Urethral caruncle 06/01/2012   SCC (squamous cell carcinoma), face 06/16/2011   NONSPECIFIC ABN FINDING RAD & OTH EXAM GI TRACT 04/24/2009  Immunization History  Administered Date(s) Administered   Fluad Quad(high Dose 65+) 05/10/2019, 05/09/2020   Influenza, High Dose Seasonal PF 04/28/2016, 04/30/2017   Influenza,inj,Quad PF,6+ Mos 05/22/2015   Pneumococcal Conjugate-13 07/10/2015   Pneumococcal Polysaccharide-23 12/17/2016   Td 06/20/2004   Tdap 12/01/2020    Conditions to be addressed/monitored:  Hypertension, Hyperlipidemia, Diabetes, Atrial Fibrillation, Heart Failure, Coronary Artery Disease, GERD, Hypothyroidism, Osteopenia, and Osteoarthritis  There are no care plans that you recently modified to display for this patient.    Medication Assistance: None required.  Patient affirms current coverage meets needs.  Compliance/Adherence/Medication fill history: Care Gaps: COVID-19  Vaccine Shingrix Vaccine DEXA Scan Ophthalmology Exam Influenza Vaccine HTN: 179/71 - 02/23/2022 A1C- 12.9%- 02/18/2022  Star-Rating Drugs: Atorvastatin 20 mg last filled on 02/09/2022 for 90 day supply at [place]. Januvia 100 mg last filled on 03/23/2022 for 30 day supply at [place]. Entresto 97-103 last filled on 03/02/2022 for 90 day supply at [place].  Patient's preferred pharmacy is:  [facility] - [place], [place] - [address] [address] Phone: [phone] Fax: [phone]  Uses pill box? Yes Pt endorses 100% compliance  We discussed: Current pharmacy is preferred with insurance plan and patient is satisfied with pharmacy services Patient decided to: Continue current medication management strategy  Care Plan and Follow Up Patient Decision:  Patient agrees to Care Plan and Follow-up.  Plan: Telephone follow up appointment with care management team member scheduled for:  07/13/22 at 9:30 AM   Junius Argyle, PharmD, [facility] [place], [place] [phone]

## 2022-04-09 DIAGNOSIS — E1121 Type 2 diabetes mellitus with diabetic nephropathy: Secondary | ICD-10-CM

## 2022-04-09 NOTE — Patient Instructions (Signed)
Visit Information It was great speaking with you today!  Please let me know if you have any questions about our visit.  Patient Care Plan: General Pharmacy (Adult)     Problem Identified: Hypertension, Hyperlipidemia, Diabetes, Atrial Fibrillation, Heart Failure, Coronary Artery Disease, GERD, Hypothyroidism, Osteopenia, and Osteoarthritis   Priority: High     Long-Range Goal: Patient-Specific Goal   Start Date: 04/09/2022  Expected End Date: 04/10/2023  This Visit's Progress: On track  Priority: High  Note:   Current Barriers:  Unable to achieve control of diabetes   Pharmacist Clinical Goal(s):  Patient will achieve control of diabetes as evidenced by A1c less than 8% through collaboration with PharmD and provider.   Interventions: 1:1 collaboration with Jerrol Banana., MD regarding development and update of comprehensive plan of care as evidenced by provider attestation and co-signature Inter-disciplinary care team collaboration (see longitudinal plan of care) Comprehensive medication review performed; medication list updated in electronic medical record  Diabetes (A1c goal <8%) -Uncontrolled -Current medications: Januvia 100 mg daily  -Medications previously tried: Glipizide (Sulfa), metformin   -Current home glucose readings fasting glucose: Not monitoring regularly  -Denies hypoglycemic/hyperglycemic symptoms -Recommended to continue current medication  Heart Failure (Goal: manage symptoms and prevent exacerbations) -Controlled -Dr. Humphrey Rolls  -Last ejection fraction: 36% (Date: Ju;l 2020) -HF type: Combined Systolic and Diastolic -NYHA Class: III (marked limitation of activity) -AHA HF Stage: C (Heart disease and symptoms present) -Current treatment: Digoxin 125 mcg every other day  Entresto 97-103 mg twice daily  Furosemide 20 mg daily  Metoprolol XL 100 mg daily  Spironolactone 25 mg daily  -Medications previously tried: NA  -Recommended to continue  current medication  Atrial Fibrillation (Goal: prevent stroke and major bleeding) -Controlled -CHADSVASC: 7 -Current treatment: Rate control:  Digoxin 125 mcg every other day Metoprolol XL 100 mg daily  Anticoagulation: Eliquis 2.5 mg twice daily  -Medications previously tried: Coumadin -Ocassionally notes blood when going to bathroom.  -Recommended to continue current medication  Hyperlipidemia: (LDL goal < 70) -Controlled -history of PVD  -Current treatment: Atorvastatin 20 mg nightly  -Medications previously tried: Crestor  -Recommended to continue current medication  Osteopenia (Goal prevent bone fractures) -Controlled -Last DEXA Scan: Aug 2014   T-Score femoral neck: -1.6  T-Score lumbar spine: +2.1 -Patient is not a candidate for pharmacologic treatment -Current treatment  Calcium + D3 600/200 mg daily  -Medications previously tried: NA  -Recommended to continue current medication  Hypothyroidism (Goal: Maintain thyroid function) -Controlled -Current treatment  Synthroid 50 mcg daily  -Medications previously tried: NA  -Recommended to continue current medication  Chronic Pain (Goal: Minimize pain, improve quality of life) -Not ideally controlled -Neuropathy, Osteoarthritis  -Current treatment  APAP 500 mg every 6 hours as needed  Celecoxib 100 mg 2 caps daily   Gabapentin 600 mg TID (on this dose since at least 2014) Imipramine (May have been started originally for OAB) -Medications previously tried: NA  -Neuropathy poorly controlled. Patient resistant to changes to gabapentin or celecoxib. Discussed risks associated with both of these agents.  -Recommended to continue current medication  GERD (Goal: Prevent heratburn/reflux) -Controlled -History of hiatal hernia  -Current treatment  Omeprazole 20 mg daily  Align probitoic every other day  -Medications previously tried: NA  -Recommended to continue current medication  Overactive Bladder (Goal:  Minimize symptoms) -Not ideally controlled Mixed urge and stress incontinence  Chronic cystitis -Current treatment  Myrbetriq  Imipramine  -Medications previously tried: NA  -Oftentimes wakes up overnight.  Utilizes adult diapers -Recommended to continue current medication   Patient Goals/Self-Care Activities Patient will:  - check glucose daily before breakfast, document, and provide at future appointments  Follow Up Plan: Telephone follow up appointment with care management team member scheduled for:  07/13/22 at 9:30 AM     Ms. Alcoser was given information about Chronic Care Management services today including:  CCM service includes personalized support from designated clinical staff supervised by her physician, including individualized plan of care and coordination with other care providers 24/7 contact phone numbers for assistance for urgent and routine care needs. Standard insurance, coinsurance, copays and deductibles apply for chronic care management only during months in which we provide at least 20 minutes of these services. Most insurances cover these services at 100%, however patients may be responsible for any copay, coinsurance and/or deductible if applicable. This service may help you avoid the need for more expensive face-to-face services. Only one practitioner may furnish and bill the service in a calendar month. The patient may stop CCM services at any time (effective at the end of the month) by phone call to the office staff.  Patient agreed to services and verbal consent obtained.   Patient verbalizes understanding of instructions and care plan provided today and agrees to view in [place]. Active MyChart status and patient understanding of how to access instructions and care plan via MyChart confirmed with patient.     Junius Argyle, PharmD, Para [place], CPP  Clinical Pharmacist Practitioner  [facility] [phone]

## 2022-04-15 ENCOUNTER — Other Ambulatory Visit: Payer: Self-pay | Admitting: Family Medicine

## 2022-04-15 DIAGNOSIS — E1121 Type 2 diabetes mellitus with diabetic nephropathy: Secondary | ICD-10-CM

## 2022-05-01 ENCOUNTER — Telehealth: Payer: Self-pay | Admitting: Family Medicine

## 2022-05-01 NOTE — Telephone Encounter (Signed)
Copied from [place] [phone]. Topic: Medicare AWV >> May 01, 2022 11:28 AM Jae Dire wrote: Reason for CRM:  No answer unable to leave a message for patient to call back and schedule Medicare Annual Wellness Visit (AWV) in office.   If unable to come into the office for AWV,  please offer to do virtually or by telephone.  Last AWV: 01/24/2020  Please schedule at anytime with [facility] Health Advisor.  30 minute appointment for Virtual or phone 45 minute appointment for in office or Initial virtual/phone  Any questions, please contact me at [phone]

## 2022-05-11 ENCOUNTER — Other Ambulatory Visit: Payer: Self-pay | Admitting: Family Medicine

## 2022-05-11 DIAGNOSIS — H02135 Senile ectropion of left lower eyelid: Secondary | ICD-10-CM | POA: Diagnosis not present

## 2022-05-11 DIAGNOSIS — R350 Frequency of micturition: Secondary | ICD-10-CM

## 2022-05-11 DIAGNOSIS — E1121 Type 2 diabetes mellitus with diabetic nephropathy: Secondary | ICD-10-CM

## 2022-05-11 DIAGNOSIS — G629 Polyneuropathy, unspecified: Secondary | ICD-10-CM

## 2022-05-11 LAB — HM DIABETES EYE EXAM

## 2022-05-21 DIAGNOSIS — I340 Nonrheumatic mitral (valve) insufficiency: Secondary | ICD-10-CM | POA: Diagnosis not present

## 2022-05-21 DIAGNOSIS — I509 Heart failure, unspecified: Secondary | ICD-10-CM | POA: Diagnosis not present

## 2022-05-21 DIAGNOSIS — E669 Obesity, unspecified: Secondary | ICD-10-CM | POA: Diagnosis not present

## 2022-05-21 DIAGNOSIS — I4891 Unspecified atrial fibrillation: Secondary | ICD-10-CM | POA: Diagnosis not present

## 2022-05-21 DIAGNOSIS — E782 Mixed hyperlipidemia: Secondary | ICD-10-CM | POA: Diagnosis not present

## 2022-05-21 DIAGNOSIS — I2510 Atherosclerotic heart disease of native coronary artery without angina pectoris: Secondary | ICD-10-CM | POA: Diagnosis not present

## 2022-05-21 DIAGNOSIS — R609 Edema, unspecified: Secondary | ICD-10-CM | POA: Diagnosis not present

## 2022-05-21 DIAGNOSIS — I10 Essential (primary) hypertension: Secondary | ICD-10-CM | POA: Diagnosis not present

## 2022-06-22 ENCOUNTER — Telehealth: Payer: Self-pay

## 2022-06-22 NOTE — Progress Notes (Signed)
    Chronic Care Management Pharmacy Assistant   Name: Dawn Burnett  MRN: [id number] DOB: Jan 17, 1929  Reason for Encounter: Medication Review/General Adherence Call.   Recent office visits:  None ID  Recent consult visits:  None ID  Hospital visits:  None in previous 6 months  Medications: Outpatient Encounter Medications as of 06/22/2022  Medication Sig   acetaminophen (TYLENOL) 500 MG tablet Take 1,000 mg by mouth every 8 (eight) hours as needed for mild pain, moderate pain, fever or headache.   atorvastatin (LIPITOR) 20 MG tablet Take 20 mg by mouth at bedtime.    blood glucose meter kit and supplies Dispense based on patient and insurance preference. Use once daily as directed. (FOR ICD-10 E11.21).   Calcium-Vitamin D 600-200 MG-UNIT per tablet Take 600 tablets by mouth daily with supper.    celecoxib (CELEBREX) 100 MG capsule TAKE 2 CAPSULES BY MOUTH DAILY AS NEEDED   digoxin (LANOXIN) 0.125 MG tablet Take 0.125 mg by mouth every other day.    ELIQUIS 2.5 MG TABS tablet Take 2.5 mg by mouth 2 (two) times daily.    ENTRESTO 97-103 MG Take 1 tablet by mouth 2 (two) times daily.    furosemide (LASIX) 20 MG tablet Take 20 mg by mouth daily.    gabapentin (NEURONTIN) 600 MG tablet TAKE 1 TABLET BY MOUTH 3 TIMES DAILY   imipramine (TOFRANIL) 25 MG tablet TAKE 1 TABLET BY MOUTH AT BEDTIME   JANUVIA 100 MG tablet TAKE 1 TABLET BY MOUTH EVERY MORNING   Liniments (BLUE-EMU SUPER STRENGTH) CREA Apply topically.   loperamide (IMODIUM) 2 MG capsule Take 2 mg by mouth as needed.    Methylcellulose, Laxative, 500 MG TABS Take 2 tablets by mouth daily.   metoprolol succinate (TOPROL-XL) 100 MG 24 hr tablet TAKE ONE TABLET BY MOUTH EVERY DAY   Multiple Vitamins-Minerals (CENTRUM SILVER ULTRA WOMENS PO) Take 1 tablet by mouth daily with lunch.    MYRBETRIQ 50 MG TB24 tablet TAKE 1 TABLET BY MOUTH DAILY   nitrofurantoin, macrocrystal-monohydrate, (MACROBID) 100 MG capsule Take 1 capsule (100  mg total) by mouth 2 (two) times daily.   nystatin (NYAMYC) powder APPLY TOPICALLY FOUR TIMES A DAY   omeprazole (PRILOSEC) 20 MG capsule TAKE 1 CAPSULE BY MOUTH ONCE DAILY   Probiotic Product (ALIGN) 4 MG CAPS Take by mouth every other day.   spironolactone (ALDACTONE) 25 MG tablet TAKE 1 TABLET BY MOUTH DAILY   SYNTHROID 50 MCG tablet TAKE 1 TABLET BY MOUTH DAILY BEFORE BREAKFAST   No facility-administered encounter medications on file as of 06/22/2022.    Care Gaps: COVID-19 Vaccine Shingrix Vaccine DEXA Scan Foot Exam AWV Influenza Vaccine HTN: 179/71 - 02/23/2022 A1C- 12.9%- 02/18/2022   Star Rating Drugs: Atorvastatin 20 mg last filled on 02/09/2022 for 90 day supply at [place]. Januvia 100 mg last filled on 04/15/2022 for 30 day supply at [place]. Entresto 97-103 last filled on 03/02/2022 for 90 day supply at [place].  Called patient and discussed medication adherence  with patient, no issues at this time with current medication.   Patient son  Denies ED visit since her last CPP follow up.  Patient son  Denies  any side effects with her medication. Patient son  Denies  any problems with hercurrent pharmacy  [place] Pharmacist Assistant [phone]

## 2022-06-24 ENCOUNTER — Ambulatory Visit: Payer: Medicare PPO | Admitting: Family Medicine

## 2022-07-13 ENCOUNTER — Ambulatory Visit (INDEPENDENT_AMBULATORY_CARE_PROVIDER_SITE_OTHER): Payer: Medicare PPO

## 2022-07-13 DIAGNOSIS — I4891 Unspecified atrial fibrillation: Secondary | ICD-10-CM

## 2022-07-13 DIAGNOSIS — E1121 Type 2 diabetes mellitus with diabetic nephropathy: Secondary | ICD-10-CM

## 2022-07-13 DIAGNOSIS — E039 Hypothyroidism, unspecified: Secondary | ICD-10-CM

## 2022-07-13 NOTE — Progress Notes (Signed)
Chronic Care Management Pharmacy Note  07/13/2022 Name:  Dawn Burnett MRN:  [id number] DOB:  [birth year]  Summary: Patient presents for CCM follow-up. Today's visit was 100% conducted with [name], the patient's daughter.   Patient will need a refill of Synthroid after her next PCP follow-up.   Not monitoring blood sugar regularly, when daughter checks the blood sugars having been ranging 170-180. She will need refill of her testing supplies but is unsure of which one she is using.   -Ocassionally notes blood in stool when going to bathroom. Neuropathy poorly controlled. Patient resistant to decreasing  to gabapentin or celecoxib. Discussed risks associated with both of these agents, including risk of serious bleed associated with NSAID + anticoagulant use.    Recommendations/Changes made from today's visit: Continue current medications  Plan: CPP follow-up 5 months  Subjective: Dawn Burnett is an 88 y.o. year old female who is a primary patient of Simmons-Robinson, Makiera, MD.  The CCM team was consulted for assistance with disease management and care coordination needs.    Engaged with patient by telephone for follow up visit in response to provider referral for pharmacy case management and/or care coordination services.   Consent to Services:  The patient was given information about Chronic Care Management services, agreed to services, and gave verbal consent prior to initiation of services.  Please see initial visit note for detailed documentation.   Patient Care Team: [name], MD as PCP - General (Family Medicine) Oneta Rack, MD as Consulting Physician (Dermatology) Dionisio David, MD as Consulting Physician (Cardiology) Samara Deist, DPM as Referring Physician (Podiatry) Jerrol Banana., MD (Family Medicine) Germaine Pomfret, [facility] (Pharmacist)  Recent office visits: 02/23/2022 Dr. Rosanna Randy MD (PCP)No Medication Changes noted 02/20/2022  Dr. Rosanna Randy MD (PCP) Stop Carollee Massed and start Januvia 100 mg daily 02/18/2022 Dr. Rosanna Randy MD (PCP) start , MACROBID 100 MG capsule 2 times daily 10/20/2021 Dr. Rosanna Randy MD (PCP) No Medication Changes noted, follow up in 6 months  Recent consult visits: 12/30/2021 Samara Deist DPM (Podiatry) No Medication Changes noted 10/16/2021 Samara Deist DPM (Podiatry) No Medication Changes noted  Hospital visits: None in previous 6 months   Objective:  Lab Results  Component Value Date   CREATININE 1.43 (H) 02/18/2022   BUN 32 02/18/2022   EGFR 35 (L) 02/18/2022   GFRNONAA 52 (L) 10/30/2019   GFRAA 60 10/30/2019   NA 134 02/18/2022   K 5.1 02/18/2022   CALCIUM 9.5 02/18/2022   CO2 23 02/18/2022   GLUCOSE 490 (H) 02/18/2022    Lab Results  Component Value Date/Time   HGBA1C 12.9 (H) 02/18/2022 03:46 PM   HGBA1C 8.8 (A) 10/20/2021 05:09 PM   HGBA1C 7.8 (H) 04/08/2021 11:18 AM   MICROALBUR 100 01/21/2015 11:26 AM    Last diabetic Eye exam:  Lab Results  Component Value Date/Time   HMDIABEYEEXA No Retinopathy 05/03/2020 12:00 AM    Last diabetic Foot exam:  Lab Results  Component Value Date/Time   HMDIABFOOTEX decreased sensation 04/18/2014 12:00 AM     Lab Results  Component Value Date   CHOL 173 02/18/2022   HDL 39 (L) 02/18/2022   LDLCALC 70 02/18/2022   TRIG 408 (H) 02/18/2022   CHOLHDL 4.4 02/18/2022       Latest Ref Rng & Units 02/18/2022    3:46 PM 04/08/2021   11:18 AM 10/30/2019    4:49 PM  Hepatic Function  Total Protein 6.0 - 8.5 g/dL 6.7  6.2  6.1   Albumin 3.6 - 4.6 g/dL 4.1  4.1  3.9   AST 0 - 40 IU/L _0 ALT 0 - 32 IU/L _1 Alk Phosphatase 44 - 121 IU/L 70  55  68   Total Bilirubin 0.0 - 1.2 mg/dL 0.4  0.6  0.8     Lab Results  Component Value Date/Time   TSH 1.460 02/18/2022 03:46 PM   TSH 1.350 04/08/2021 11:18 AM       Latest Ref Rng & Units 02/18/2022    3:46 PM 04/08/2021   11:18 AM 10/30/2019    4:49 PM  CBC  WBC 3.4 -  10.8 x10E3/uL 7.8  6.9  6.4   Hemoglobin 11.1 - 15.9 g/dL 13.0  12.3  13.7   Hematocrit 34.0 - 46.6 % 39.9  36.9  39.8   Platelets 150 - 450 x10E3/uL 126  148  129     No results found for: "VD25OH"  Clinical ASCVD: No  The ASCVD Risk score (Arnett DK, et al., 2019) failed to calculate for the following reasons:   The 2019 ASCVD risk score is only valid for ages 37 to 54       09/09/2020   11:46 AM 01/03/2019   10:53 AM 12/29/2017    9:38 AM  Depression screen PHQ 2/9  Decreased Interest 2 0 0  Down, Depressed, Hopeless 2 0 0  PHQ - 2 Score 4 0 0  Altered sleeping 1  0  Tired, decreased energy 3  1  Change in appetite 2  1  Feeling bad or failure about yourself  0  0  Trouble concentrating 1  0  Moving slowly or fidgety/restless 0  0  Suicidal thoughts 0  0  PHQ-9 Score 11  2  Difficult doing work/chores Very difficult  Not difficult at all     Social History   Tobacco Use  Smoking Status Never  Smokeless Tobacco Never   BP Readings from Last 3 Encounters:  02/23/22 (!) 179/71  02/18/22 (!) 145/83  10/20/21 (!) 145/81   Pulse Readings from Last 3 Encounters:  02/23/22 70  02/18/22 92  10/20/21 70   Wt Readings from Last 3 Encounters:  10/20/21 149 lb (67.6 kg)  04/08/21 141 lb (64 kg)  12/09/20 140 lb 9.6 oz (63.8 kg)   BMI Readings from Last 3 Encounters:  10/20/21 23.34 kg/m  04/08/21 22.08 kg/m  12/09/20 22.02 kg/m    Assessment/Interventions: Review of patient past medical history, allergies, medications, health status, including review of consultants reports, laboratory and other test data, was performed as part of comprehensive evaluation and provision of chronic care management services.   SDOH:  (Social Determinants of Health) assessments and interventions performed: Yes SDOH Interventions    Flowsheet Row Clinical Support from 01/24/2020 in [place] Interventions   Housing Interventions Intervention Not Indicated   Financial Strain Interventions Intervention Not Indicated  Physical Activity Interventions Patient Refused  Social Connections Interventions Intervention Not Indicated      SDOH Screenings   Food Insecurity: No Food Insecurity (01/24/2020)  Housing: Low Risk  (01/24/2020)  Transportation Needs: No Transportation Needs (01/24/2020)  Alcohol Screen: Low Risk  (09/09/2020)  Depression (PHQ2-9): Medium Risk (09/09/2020)  Financial Resource Strain: Low Risk  (01/24/2020)  Physical Activity: Inactive (01/24/2020)  Social Connections: Socially Isolated (01/24/2020)  Stress: No Stress Concern Present (01/24/2020)  Tobacco Use: Low Risk  (02/23/2022)  CCM Care Plan  Allergies  Allergen Reactions   Iodinated Contrast Media Rash   Augmentin [Amoxicillin-Pot Clavulanate] Other (See Comments)    Gi distress    Codeine    Crestor [Rosuvastatin] Other (See Comments)    Unknown Unknown   Levaquin [Levofloxacin In D5w]     Stomach pain, body pain/ache all over Stomach pain, body pain/ache all over   Naproxen Other (See Comments)    unknown unknown   Prednisone    Sulfa Antibiotics    Sulfa Antibiotics Other (See Comments)   Sulfacetamide Sodium    Tolectin [Tolmetin]     Medications Reviewed Today     Reviewed by Julieta Bellini, CMA (Certified Medical Assistant) on 02/23/22 at 1540  Med List Status: <None>   Medication Order Taking? Sig Documenting Provider Last Dose Status Informant  acetaminophen (TYLENOL) 500 MG tablet [id number] Yes Take 500 mg by mouth every 6 (six) hours as needed for mild pain, moderate pain, fever or headache. [provider] Taking Active Self  atorvastatin (LIPITOR) 20 MG tablet [id number] Yes Take 20 mg by mouth at bedtime.  [provider] Taking Active Self           Med Note Sylvan Cheese Sep 26, 2015  5:38 PM)    blood glucose meter kit and supplies [id number] Yes Dispense based on patient and insurance preference. Use once daily  as directed. (FOR ICD-10 E11.21). Jerrol Banana., MD Taking Active   Calcium-Vitamin D 600-200 MG-UNIT per tablet [id number] Yes Take 600 tablets by mouth daily with supper.  [provider] Taking Active Self           Med Note Sylvan Cheese Sep 26, 2015  5:38 PM)    celecoxib (CELEBREX) 100 MG capsule [id number] Yes TAKE 2 CAPSULES BY MOUTH DAILY AS NEEDED Jerrol Banana., MD Taking Active   digoxin (LANOXIN) 0.125 MG tablet [id number] Yes Take 0.125 mg by mouth every other day.  [provider] Taking Active   ELIQUIS 2.5 MG TABS tablet [id number] Yes Take 2.5 mg by mouth 2 (two) times daily.  [provider] Taking Active   ENTRESTO 97-103 MG [id number] Yes Take 1 tablet by mouth 2 (two) times daily.  [provider] Taking Active   furosemide (LASIX) 20 MG tablet [id number] Yes Take 20 mg by mouth daily.  [provider] Taking Active Self           Med Note Sylvan Cheese Sep 26, 2015  5:38 PM)    gabapentin (NEURONTIN) 600 MG tablet [id number] Yes TAKE 1 TABLET BY MOUTH 3 TIMES DAILY Jerrol Banana., MD Taking Active   imipramine (TOFRANIL) 25 MG tablet [id number] Yes TAKE 1 TABLET BY MOUTH AT BEDTIME Jerrol Banana., MD Taking Active   loperamide (IMODIUM) 2 MG capsule [id number] Yes Take 2 mg by mouth as needed.  [provider] Taking Active   Methylcellulose, Laxative, 500 MG TABS [id number] Yes Take 2 tablets by mouth daily. [provider] Taking Active   metoprolol succinate (TOPROL-XL) 100 MG 24 hr tablet [id number] Yes TAKE ONE TABLET BY MOUTH EVERY DAY Jerrol Banana., MD Taking Active   Multiple Vitamins-Minerals (CENTRUM SILVER ULTRA WOMENS PO) [id number] Yes Take 1 tablet by mouth daily with lunch.  [provider] Taking Active Self  MYRBETRIQ 50 MG TB24 tablet [id number] Yes TAKE 1 TABLET BY MOUTH  DAILY Jerrol Banana., MD Taking Active   nitrofurantoin,  macrocrystal-monohydrate, (MACROBID) 100 MG capsule [id number] Yes Take 1 capsule (100 mg total) by mouth 2 (two) times daily. Jerrol Banana., MD Taking Active   nystatin [facility]) powder [id number] Yes APPLY TOPICALLY FOUR TIMES A DAY Jerrol Banana., MD Taking Active   omeprazole [facility]) 20 MG capsule [id number] Yes TAKE 1 CAPSULE BY MOUTH ONCE DAILY Jerrol Banana., MD Taking Active   Probiotic Product (ALIGN) 4 MG CAPS [id number] Yes Take by mouth every other day. [provider] Taking Active   sitaGLIPtin (JANUVIA) 100 MG tablet [id number] Yes Take 1 tablet (100 mg total) by mouth every morning. Jerrol Banana., MD Taking Active   spironolactone (ALDACTONE) 25 MG tablet [id number] Yes TAKE 1 TABLET BY MOUTH DAILY Jerrol Banana., MD Taking Active   SYNTHROID 40 MCG tablet [id number] Yes TAKE 1 TABLET BY MOUTH DAILY BEFORE BREAKFAST Jerrol Banana., MD Taking Active             Patient Active Problem List   Diagnosis Date Noted   Chronic CHF (congestive heart failure) ([place]) 12/25/2017   Incontinence of feces with fecal urgency 09/06/2017   Osteoarthritis of knee 03/03/2017   Type 2 diabetes mellitus with diabetic nephropathy, without long-term current use of insulin ([place]) 09/01/2016   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) 12/21/2014   Allergic rhinitis 12/07/2014   Arthritis 12/07/2014   Atrial fibrillation, controlled ([place]) 12/07/2014   Arteriosclerosis of coronary artery 12/07/2014   Essential (primary) hypertension 12/07/2014   Acid reflux 12/07/2014   History of prolonged Q-T interval on ECG 12/07/2014   Blood glucose elevated 12/07/2014   Adult hypothyroidism 12/07/2014   Urinary incontinence 12/07/2014   Neuropathy 12/07/2014   Osteopenia 12/07/2014   HLD (hyperlipidemia) 12/07/2014   Alteration in bowel elimination: incontinence 12/07/2014   Hiatal hernia    Atrial fibrillation (HCC)    Basal cell  carcinoma of ear 11/14/2014   Bladder infection, chronic 06/01/2012   Urethral caruncle 06/01/2012   SCC (squamous cell carcinoma), face 06/16/2011   NONSPECIFIC ABN FINDING RAD & OTH EXAM GI TRACT 04/24/2009    Immunization History  Administered Date(s) Administered   Fluad Quad(high Dose 65+) 05/10/2019, 05/09/2020   Influenza, High Dose Seasonal PF 04/28/2016, 04/30/2017   Influenza,inj,Quad PF,6+ Mos 05/22/2015   Pneumococcal Conjugate-13 07/10/2015   Pneumococcal Polysaccharide-23 12/17/2016   Td 06/20/2004   Tdap 12/01/2020    Conditions to be addressed/monitored:  Hypertension, Hyperlipidemia, Diabetes, Atrial Fibrillation, Heart Failure, Coronary Artery Disease, GERD, Hypothyroidism, Osteopenia, and Osteoarthritis  Care Plan : General Pharmacy (Adult)  Updates made by Germaine Pomfret, RPH since 07/13/2022 12:00 AM     Problem: Hypertension, Hyperlipidemia, Diabetes, Atrial Fibrillation, Heart Failure, Coronary Artery Disease, GERD, Hypothyroidism, Osteopenia, and Osteoarthritis   Priority: High     Long-Range Goal: Patient-Specific Goal   Start Date: 04/09/2022  Expected End Date: 04/10/2023  This Visit's Progress: On track  Recent Progress: On track  Priority: High  Note:   Current Barriers:  Unable to achieve control of diabetes   Pharmacist Clinical Goal(s):  Patient will achieve control of diabetes as evidenced by A1c less than 8% through collaboration with PharmD and provider.   Interventions: 1:1 collaboration with Jerrol Banana., MD regarding development and update of comprehensive plan of care as evidenced by provider attestation and co-signature Inter-disciplinary care team collaboration (see longitudinal plan of care) Comprehensive  medication review performed; medication list updated in electronic medical record  Diabetes (A1c goal <8%) -Uncontrolled -Current medications: Januvia 100 mg daily  -Medications previously tried: Glipizide  (Sulfa), metformin   -Current home glucose readings fasting glucose: Not monitoring regularly, when daughter checks the blood sugars having been ranging 170-180  -Denies hypoglycemic/hyperglycemic symptoms -Recommended to continue current medication  Heart Failure (Goal: manage symptoms and prevent exacerbations) -Controlled -Managed by Dr. Humphrey Rolls  -Last ejection fraction: 36% (Date: Ju;l 2020) -HF type: Combined Systolic and Diastolic -NYHA Class: III (marked limitation of activity) -AHA HF Stage: C (Heart disease and symptoms present) -Current treatment: Digoxin 125 mcg every other day  Entresto 97-103 mg twice daily  Furosemide 20 mg daily  Metoprolol XL 100 mg daily  Spironolactone 25 mg daily  -Medications previously tried: NA  -Recommended to continue current medication  Atrial Fibrillation (Goal: prevent stroke and major bleeding) -Controlled -CHADSVASC: 7 -Current treatment: Rate control:  Digoxin 125 mcg every other day Metoprolol XL 100 mg daily  Anticoagulation: Eliquis 2.5 mg twice daily  -Medications previously tried: Coumadin -Ocassionally notes blood in stool when going to bathroom. She continues to insist   -Recommended to continue current medication  Hyperlipidemia: (LDL goal < 70) -Controlled -history of PVD  -Current treatment: Atorvastatin 20 mg nightly  -Medications previously tried: Crestor  -Recommended to continue current medication  Osteopenia (Goal prevent bone fractures) -Controlled -Last DEXA Scan: Aug 2014   T-Score femoral neck: -1.6  T-Score lumbar spine: +2.1 -Patient is not a candidate for pharmacologic treatment -Current treatment  Calcium + D3 600/200 mg daily  -Medications previously tried: NA  -Recommended to continue current medication  Hypothyroidism (Goal: Maintain thyroid function) -Controlled -Current treatment  Synthroid 50 mcg daily  -Medications previously tried: NA  -Recommended to continue current  medication  Chronic Pain (Goal: Minimize pain, improve quality of life) -Not ideally controlled -Neuropathy, Osteoarthritis  -Current treatment  APAP 500 mg every 6 hours as needed  Celecoxib 100 mg 2 caps daily   Gabapentin 600 mg TID (on this dose since at least 2014) Imipramine (May have been started originally for OAB) -Medications previously tried: NA  -Neuropathy poorly controlled. Patient resistant to decreasing  to gabapentin or celecoxib. Discussed risks associated with both of these agents, including risk of serious bleed associated with NSAID + anticoagulant use.   -Recommended to continue current medication  GERD (Goal: Prevent heratburn/reflux) -Controlled -History of hiatal hernia  -Current treatment  Omeprazole 20 mg daily  Align probitoic every other day  -Medications previously tried: NA  -Recommended to continue current medication  Overactive Bladder (Goal: Minimize symptoms) -Not ideally controlled Mixed urge and stress incontinence  Chronic cystitis -Current treatment  Myrbetriq 50 mg daily  Imipramine 25 mg nightly  -Medications previously tried: NA  -Oftentimes wakes up overnight. Utilizes adult diapers -Recommended to continue current medication  Patient Goals/Self-Care Activities Patient will:  - check glucose daily before breakfast, document, and provide at future appointments  Follow Up Plan: Telephone follow up appointment with care management team member scheduled for:  12/21/2022 at 11:00 AM       Medication Assistance: None required.  Patient affirms current coverage meets needs.  Compliance/Adherence/Medication fill history: Care Gaps: COVID-19 Vaccine Shingrix Vaccine DEXA Scan Ophthalmology Exam Influenza Vaccine HTN: 179/71 - 02/23/2022 A1C- 12.9%- 02/18/2022  [facility]: Atorvastatin 20 mg last filled on 02/09/2022 for 90 day supply at [facility]. Januvia 100 mg last filled on 03/23/2022 for 30 day supply at  [facility]. Entresto 97-103 last filled on 03/02/2022 for 90 day supply at [facility].  Patient's preferred pharmacy is:  [facility] - [place], [place] - [address] [address] Phone: [phone] Fax: [phone]  Uses pill box? Yes Pt endorses 100% compliance  We discussed: Current pharmacy is preferred with insurance plan and patient is satisfied with pharmacy services Patient decided to: Continue current medication management strategy  Care Plan and Follow Up Patient Decision:  Patient agrees to Care Plan and Follow-up.  Plan: Telephone follow up appointment with care management team member scheduled for:  12/21/2022 at 11:00 AM   Junius Argyle, PharmD, [place], [place] [phone]

## 2022-07-13 NOTE — Patient Instructions (Signed)
Visit Information It was great speaking with you today!  Please let me know if you have any questions about our visit.   Goals Addressed   None     Patient Care Plan: General Pharmacy (Adult)     Problem Identified: Hypertension, Hyperlipidemia, Diabetes, Atrial Fibrillation, Heart Failure, Coronary Artery Disease, GERD, Hypothyroidism, Osteopenia, and Osteoarthritis   Priority: High     Long-Range Goal: Patient-Specific Goal   Start Date: 04/09/2022  Expected End Date: 04/10/2023  This Visit's Progress: On track  Recent Progress: On track  Priority: High  Note:   Current Barriers:  Unable to achieve control of diabetes   Pharmacist Clinical Goal(s):  Patient will achieve control of diabetes as evidenced by A1c less than 8% through collaboration with PharmD and provider.   Interventions: 1:1 collaboration with Jerrol Banana., MD regarding development and update of comprehensive plan of care as evidenced by provider attestation and co-signature Inter-disciplinary care team collaboration (see longitudinal plan of care) Comprehensive medication review performed; medication list updated in electronic medical record  Diabetes (A1c goal <8%) -Uncontrolled -Current medications: Januvia 100 mg daily  -Medications previously tried: Glipizide (Sulfa), metformin   -Current home glucose readings fasting glucose: Not monitoring regularly, when daughter checks the blood sugars having been ranging 170-180  -Denies hypoglycemic/hyperglycemic symptoms -Recommended to continue current medication  Heart Failure (Goal: manage symptoms and prevent exacerbations) -Controlled -Managed by Dr. Humphrey Rolls  -Last ejection fraction: 36% (Date: Ju;l 2020) -HF type: Combined Systolic and Diastolic -NYHA Class: III (marked limitation of activity) -AHA HF Stage: C (Heart disease and symptoms present) -Current treatment: Digoxin 125 mcg every other day  Entresto 97-103 mg twice daily  Furosemide  20 mg daily  Metoprolol XL 100 mg daily  Spironolactone 25 mg daily  -Medications previously tried: NA  -Recommended to continue current medication  Atrial Fibrillation (Goal: prevent stroke and major bleeding) -Controlled -CHADSVASC: 7 -Current treatment: Rate control:  Digoxin 125 mcg every other day Metoprolol XL 100 mg daily  Anticoagulation: Eliquis 2.5 mg twice daily  -Medications previously tried: Coumadin -Ocassionally notes blood in stool when going to bathroom. She continues to insist   -Recommended to continue current medication  Hyperlipidemia: (LDL goal < 70) -Controlled -history of PVD  -Current treatment: Atorvastatin 20 mg nightly  -Medications previously tried: Crestor  -Recommended to continue current medication  Osteopenia (Goal prevent bone fractures) -Controlled -Last DEXA Scan: Aug 2014   T-Score femoral neck: -1.6  T-Score lumbar spine: +2.1 -Patient is not a candidate for pharmacologic treatment -Current treatment  Calcium + D3 600/200 mg daily  -Medications previously tried: NA  -Recommended to continue current medication  Hypothyroidism (Goal: Maintain thyroid function) -Controlled -Current treatment  Synthroid 50 mcg daily  -Medications previously tried: NA  -Recommended to continue current medication  Chronic Pain (Goal: Minimize pain, improve quality of life) -Not ideally controlled -Neuropathy, Osteoarthritis  -Current treatment  APAP 500 mg every 6 hours as needed  Celecoxib 100 mg 2 caps daily   Gabapentin 600 mg TID (on this dose since at least 2014) Imipramine (May have been started originally for OAB) -Medications previously tried: NA  -Neuropathy poorly controlled. Patient resistant to decreasing  to gabapentin or celecoxib. Discussed risks associated with both of these agents, including risk of serious bleed associated with NSAID + anticoagulant use.   -Recommended to continue current medication  GERD (Goal: Prevent  heratburn/reflux) -Controlled -History of hiatal hernia  -Current treatment  Omeprazole 20 mg daily  Align probitoic  every other day  -Medications previously tried: NA  -Recommended to continue current medication  Overactive Bladder (Goal: Minimize symptoms) -Not ideally controlled Mixed urge and stress incontinence  Chronic cystitis -Current treatment  Myrbetriq 50 mg daily  Imipramine 25 mg nightly  -Medications previously tried: NA  -Oftentimes wakes up overnight. Utilizes adult diapers -Recommended to continue current medication  Patient Goals/Self-Care Activities Patient will:  - check glucose daily before breakfast, document, and provide at future appointments  Follow Up Plan: Telephone follow up appointment with care management team member scheduled for:  12/21/2022 at 11:00 AM     Patient agreed to services and verbal consent obtained.   Patient verbalizes understanding of instructions and care plan provided today and agrees to view in [place]. Active MyChart status and patient understanding of how to access instructions and care plan via MyChart confirmed with patient.     Junius Argyle, PharmD, [place], CPP  Clinical Pharmacist Practitioner  [facility] [phone]

## 2022-07-20 ENCOUNTER — Other Ambulatory Visit: Payer: Self-pay

## 2022-07-20 ENCOUNTER — Telehealth: Payer: Self-pay | Admitting: Family Medicine

## 2022-07-20 DIAGNOSIS — E1121 Type 2 diabetes mellitus with diabetic nephropathy: Secondary | ICD-10-CM

## 2022-07-20 MED ORDER — SITAGLIPTIN PHOSPHATE 100 MG PO TABS: 100.0000 mg | ORAL_TABLET | Freq: Every morning | ORAL | 2 refills | Status: DC

## 2022-07-20 NOTE — Telephone Encounter (Signed)
[facility] faxed refill request for the following medications:   JANUVIA 100 MG tablet    Please advise.

## 2022-07-21 NOTE — Progress Notes (Deleted)
Established patient visit   Patient: Dawn Burnett   DOB: Nov 08, 1928   88 y.o. Female  MRN: [id number] Visit Date: 07/22/2022  Today's healthcare provider: [name], MD   No chief complaint on file.  Subjective    HPI  Hypertension, follow-up  BP Readings from Last 3 Encounters:  02/23/22 (!) 179/71  02/18/22 (!) 145/83  10/20/21 (!) 145/81   Wt Readings from Last 3 Encounters:  10/20/21 149 lb (67.6 kg)  04/08/21 141 lb (64 kg)  12/09/20 140 lb 9.6 oz (63.8 kg)     She was last seen for hypertension 4 months ago.  BP at that visit was 179/71. Management since that visit includes continue current medication Januvia 100 mg.  She reports {excellent/good/fair/poor:19665} compliance with treatment.   Outside blood pressures are {***enter patient reported home BP readings, or 'not being checked':1}. Symptoms: {Yes/No:20286} chest pain {Yes/No:20286} chest pressure  {Yes/No:20286} palpitations {Yes/No:20286} syncope  {Yes/No:20286} dyspnea {Yes/No:20286} orthopnea  {Yes/No:20286} paroxysmal nocturnal dyspnea {Yes/No:20286} lower extremity edema   Pertinent labs Lab Results  Component Value Date   CHOL 173 02/18/2022   HDL 39 (L) 02/18/2022   LDLCALC 70 02/18/2022   TRIG 408 (H) 02/18/2022   CHOLHDL 4.4 02/18/2022   Lab Results  Component Value Date   NA 134 02/18/2022   K 5.1 02/18/2022   CREATININE 1.43 (H) 02/18/2022   EGFR 35 (L) 02/18/2022   GLUCOSE 490 (H) 02/18/2022   TSH 1.460 02/18/2022     The ASCVD Risk score (Arnett DK, et al., 2019) failed to calculate for the following reasons:   The 2019 ASCVD risk score is only valid for ages 25 to 94  ---------------------------------------------------------------------------------------------------   Medications: Outpatient Medications Prior to Visit  Medication Sig   acetaminophen (TYLENOL) 500 MG tablet Take 1,000 mg by mouth every 8 (eight) hours as needed for mild pain, moderate  pain, fever or headache.   atorvastatin (LIPITOR) 20 MG tablet Take 20 mg by mouth at bedtime.    blood glucose meter kit and supplies Dispense based on patient and insurance preference. Use once daily as directed. (FOR ICD-10 E11.21).   Calcium-Vitamin D 600-200 MG-UNIT per tablet Take 600 tablets by mouth daily with supper.    celecoxib (CELEBREX) 100 MG capsule TAKE 2 CAPSULES BY MOUTH DAILY AS NEEDED   digoxin (LANOXIN) 0.125 MG tablet Take 0.125 mg by mouth every other day.    ELIQUIS 2.5 MG TABS tablet Take 2.5 mg by mouth 2 (two) times daily.    ENTRESTO 97-103 MG Take 1 tablet by mouth 2 (two) times daily.    furosemide (LASIX) 20 MG tablet Take 20 mg by mouth daily.    gabapentin (NEURONTIN) 600 MG tablet TAKE 1 TABLET BY MOUTH 3 TIMES DAILY   imipramine (TOFRANIL) 25 MG tablet TAKE 1 TABLET BY MOUTH AT BEDTIME   Liniments (BLUE-EMU SUPER STRENGTH) CREA Apply topically.   loperamide (IMODIUM) 2 MG capsule Take 2 mg by mouth as needed.    Methylcellulose, Laxative, 500 MG TABS Take 2 tablets by mouth daily.   metoprolol succinate (TOPROL-XL) 100 MG 24 hr tablet TAKE ONE TABLET BY MOUTH EVERY DAY   Multiple Vitamins-Minerals (CENTRUM SILVER ULTRA WOMENS PO) Take 1 tablet by mouth daily with lunch.    MYRBETRIQ 50 MG TB24 tablet TAKE 1 TABLET BY MOUTH DAILY   nitrofurantoin, macrocrystal-monohydrate, (MACROBID) 100 MG capsule Take 1 capsule (100 mg total) by mouth 2 (two) times daily.   nystatin (  NYAMYC) powder APPLY TOPICALLY FOUR TIMES A DAY   omeprazole (PRILOSEC) 20 MG capsule TAKE 1 CAPSULE BY MOUTH ONCE DAILY   Probiotic Product (ALIGN) 4 MG CAPS Take by mouth every other day.   sitaGLIPtin (JANUVIA) 100 MG tablet Take 1 tablet (100 mg total) by mouth every morning.   spironolactone (ALDACTONE) 25 MG tablet TAKE 1 TABLET BY MOUTH DAILY   SYNTHROID 50 MCG tablet TAKE 1 TABLET BY MOUTH DAILY BEFORE BREAKFAST   No facility-administered medications prior to visit.    Review of  Systems  {Labs  Heme  Chem  Endocrine  Serology  Results Review (optional):23779}   Objective    There were no vitals taken for this visit. {Show previous vital signs (optional):23777}  Physical Exam  ***  No results found for any visits on 07/22/22.  Assessment & Plan     ***  No follow-ups on file.      {provider attestation***:1}   [name], MD  [facility] [phone] (phone) [phone] (fax)  [place]

## 2022-07-22 ENCOUNTER — Ambulatory Visit: Payer: Medicare PPO | Admitting: Family Medicine

## 2022-07-22 ENCOUNTER — Other Ambulatory Visit: Payer: Self-pay | Admitting: Family Medicine

## 2022-07-22 DIAGNOSIS — E039 Hypothyroidism, unspecified: Secondary | ICD-10-CM

## 2022-07-22 MED ORDER — SYNTHROID 50 MCG PO TABS: 50.0000 ug | ORAL_TABLET | Freq: Every day | ORAL | 1 refills | Status: DC

## 2022-07-22 NOTE — Telephone Encounter (Signed)
Patient's daughter called to reschedule patients appointment that was today because she is the person that brings her mother to appointments and she is sick. Patient needs a visit before she can have her SYNTHROID 50 MCG tablet refilled. She currently has 6 tablets left. Patient was scheduled for Dr. Ruffin Frederick next available on Jan 2. Patient's daughter is requesting enough medication be sent to last her to the appointment.

## 2022-07-22 NOTE — Telephone Encounter (Signed)
Requested Prescriptions  Pending Prescriptions Disp Refills   SYNTHROID 50 MCG tablet 90 tablet 1    Sig: Take 1 tablet (50 mcg total) by mouth daily before breakfast.     Endocrinology:  Hypothyroid Agents Passed - 07/22/2022  9:27 AM      Passed - TSH in normal range and within 360 days    TSH  Date Value Ref Range Status  02/18/2022 1.460 0.450 - 4.500 uIU/mL Final         Passed - Valid encounter within last 12 months    Recent Outpatient Visits           4 months ago Type 2 diabetes mellitus with diabetic nephropathy, without long-term current use of insulin [facility])   [facility] Jerrol Banana., MD   5 months ago Urinary tract infection with hematuria, site unspecified   [facility] Jerrol Banana., MD   9 months ago Type 2 diabetes mellitus with diabetic nephropathy, without long-term current use of insulin [facility])   [facility] Jerrol Banana., MD   1 year ago Type 2 diabetes mellitus with diabetic nephropathy, without long-term current use of insulin [facility])   [facility] Jerrol Banana., MD   1 year ago Type 2 diabetes mellitus with diabetic nephropathy, without long-term current use of insulin [facility])   [facility] Jerrol Banana., MD       Future Appointments             In 2 weeks Simmons-Robinson, Riki Sheer, MD [facility] [place]

## 2022-08-07 NOTE — Progress Notes (Deleted)
Established patient visit   Patient: Dawn Burnett   DOB: April 06, 1929   88 y.o. Female  MRN: [id number] Visit Date: 08/11/2022  Today's healthcare provider: [name], MD   No chief complaint on file.  Subjective    HPI  Diabetes Mellitus Type II, follow-up  Lab Results  Component Value Date   HGBA1C 12.9 (H) 02/18/2022   HGBA1C 8.8 (A) 10/20/2021   HGBA1C 7.8 (H) 04/08/2021   Last seen for diabetes 5 months ago.  Management since then includes januvia 100 mg daily to get A1c below 10. Not a candidate for pioglitazone due to hx of heart failure. Wilder Glade may a good option if she can afford it with her insurance.  She reports {excellent/good/fair/poor:19665} compliance with treatment. She {is/is not:21021397} having side effects. {document side effects if present:1}  Home blood sugar records: {diabetes glucometry results:16657}  Episodes of hypoglycemia? {Yes/No:20286} {enter details if yes:1}   Current insulin regiment: none Most Recent Eye Exam: utd  --------------------------------------------------------------------------------------------------- Hypertension, follow-up  BP Readings from Last 3 Encounters:  02/23/22 (!) 179/71  02/18/22 (!) 145/83  10/20/21 (!) 145/81   Wt Readings from Last 3 Encounters:  10/20/21 149 lb (67.6 kg)  04/08/21 141 lb (64 kg)  12/09/20 140 lb 9.6 oz (63.8 kg)     She was last seen for hypertension 5 months ago.  BP at that visit was 179/71. Management since that visit includes no changes. She reports {excellent/good/fair/poor:19665} compliance with treatment. She {is/is not:9024} having side effects. {document side effects if present:1}  Outside blood pressures are {enter patient reported home BP, or 'not being checked':1}  --------------------------------------------------------------------------------------------------   Medications: Outpatient Medications Prior to Visit  Medication Sig   acetaminophen  (TYLENOL) 500 MG tablet Take 1,000 mg by mouth every 8 (eight) hours as needed for mild pain, moderate pain, fever or headache.   atorvastatin (LIPITOR) 20 MG tablet Take 20 mg by mouth at bedtime.    blood glucose meter kit and supplies Dispense based on patient and insurance preference. Use once daily as directed. (FOR ICD-10 E11.21).   Calcium-Vitamin D 600-200 MG-UNIT per tablet Take 600 tablets by mouth daily with supper.    celecoxib (CELEBREX) 100 MG capsule TAKE 2 CAPSULES BY MOUTH DAILY AS NEEDED   digoxin (LANOXIN) 0.125 MG tablet Take 0.125 mg by mouth every other day.    ELIQUIS 2.5 MG TABS tablet Take 2.5 mg by mouth 2 (two) times daily.    ENTRESTO 97-103 MG Take 1 tablet by mouth 2 (two) times daily.    furosemide (LASIX) 20 MG tablet Take 20 mg by mouth daily.    gabapentin (NEURONTIN) 600 MG tablet TAKE 1 TABLET BY MOUTH 3 TIMES DAILY   imipramine (TOFRANIL) 25 MG tablet TAKE 1 TABLET BY MOUTH AT BEDTIME   Liniments (BLUE-EMU SUPER STRENGTH) CREA Apply topically.   loperamide (IMODIUM) 2 MG capsule Take 2 mg by mouth as needed.    Methylcellulose, Laxative, 500 MG TABS Take 2 tablets by mouth daily.   metoprolol succinate (TOPROL-XL) 100 MG 24 hr tablet TAKE ONE TABLET BY MOUTH EVERY DAY   Multiple Vitamins-Minerals (CENTRUM SILVER ULTRA WOMENS PO) Take 1 tablet by mouth daily with lunch.    MYRBETRIQ 50 MG TB24 tablet TAKE 1 TABLET BY MOUTH DAILY   nitrofurantoin, macrocrystal-monohydrate, (MACROBID) 100 MG capsule Take 1 capsule (100 mg total) by mouth 2 (two) times daily.   nystatin (NYAMYC) powder APPLY TOPICALLY FOUR TIMES A DAY  omeprazole (PRILOSEC) 20 MG capsule TAKE 1 CAPSULE BY MOUTH ONCE DAILY   Probiotic Product (ALIGN) 4 MG CAPS Take by mouth every other day.   sitaGLIPtin (JANUVIA) 100 MG tablet Take 1 tablet (100 mg total) by mouth every morning.   spironolactone (ALDACTONE) 25 MG tablet TAKE 1 TABLET BY MOUTH DAILY   SYNTHROID 50 MCG tablet Take 1 tablet (50  mcg total) by mouth daily before breakfast.   No facility-administered medications prior to visit.    Review of Systems  Constitutional:  Negative for appetite change and fatigue.  Eyes:  Negative for visual disturbance.  Respiratory:  Negative for chest tightness and shortness of breath.   Cardiovascular:  Negative for chest pain and leg swelling.  Gastrointestinal:  Negative for abdominal pain, nausea and vomiting.  Neurological:  Negative for dizziness, light-headedness and headaches.    {Labs  Heme  Chem  Endocrine  Serology  Results Review (optional):23779}   Objective    There were no vitals taken for this visit. BP Readings from Last 3 Encounters:  02/23/22 (!) 179/71  02/18/22 (!) 145/83  10/20/21 (!) 145/81   Wt Readings from Last 3 Encounters:  10/20/21 149 lb (67.6 kg)  04/08/21 141 lb (64 kg)  12/09/20 140 lb 9.6 oz (63.8 kg)      Physical Exam  ***  No results found for any visits on 08/11/22.  Assessment & Plan     ***  No follow-ups on file.      {provider attestation***:1}   [name], MD  [facility] [phone] (phone) [phone] (fax)  [place]

## 2022-08-09 DIAGNOSIS — I4891 Unspecified atrial fibrillation: Secondary | ICD-10-CM

## 2022-08-09 DIAGNOSIS — E1121 Type 2 diabetes mellitus with diabetic nephropathy: Secondary | ICD-10-CM

## 2022-08-09 DIAGNOSIS — E039 Hypothyroidism, unspecified: Secondary | ICD-10-CM

## 2022-08-11 ENCOUNTER — Ambulatory Visit: Payer: Medicare PPO | Admitting: Family Medicine

## 2022-08-11 DIAGNOSIS — M8580 Other specified disorders of bone density and structure, unspecified site: Secondary | ICD-10-CM

## 2022-08-11 DIAGNOSIS — I10 Essential (primary) hypertension: Secondary | ICD-10-CM

## 2022-08-11 DIAGNOSIS — E1121 Type 2 diabetes mellitus with diabetic nephropathy: Secondary | ICD-10-CM

## 2022-08-26 NOTE — Progress Notes (Signed)
I,Dawn Burnett,acting as a Education administrator for [employer], MD.,have documented all relevant documentation on the behalf of Dawn Foster, MD,as directed by  Dawn Foster, MD while in the presence of Dawn Foster, MD.   Established patient visit   Patient: Dawn Burnett   DOB: 23-Jul-1929   89 y.o. Female  MRN: [id number] Visit Date: 08/27/2022  Today's healthcare provider: [name], MD   Chief Complaint  Patient presents with   Follow-up   Subjective    HPI   [name], daughter is present for visit   Dawn Burnett, patient's son takes care of medications   Patient lives alone and children are in same neighborhood and visit multiple times throughout the day   HM: patient has had shingles multiple times and is not sure she would like to be vaccinated for it at this time   Diabetes Mellitus Type II, Follow-up  Lab Results  Component Value Date   HGBA1C 7.6 (A) 08/27/2022   HGBA1C 12.9 (H) 02/18/2022   HGBA1C 8.8 (A) 10/20/2021   Wt Readings from Last 3 Encounters:  08/27/22 145 lb (65.8 kg)  10/20/21 149 lb (67.6 kg)  04/08/21 141 lb (64 kg)   Last seen for diabetes 6 months ago.  Management since then includes continuing treatment plan which includes the januvia '100mg'$  daily . She reports poor compliance with treatment. She is not having side effects.  Home blood sugar records:  not being taken  Episodes of hypoglycemia? No   Has upcoming cardiology and Dm eye exams  She sees podiatry for neuropathy and they complete her foot exams  Last was in March 2023  Needs lancets and test strips     Pertinent Labs: Lab Results  Component Value Date   CHOL 173 02/18/2022   HDL 39 (L) 02/18/2022   LDLCALC 70 02/18/2022   TRIG 408 (H) 02/18/2022   CHOLHDL 4.4 02/18/2022   Lab Results  Component Value Date   NA 134 02/18/2022   K 5.1 02/18/2022   CREATININE 1.43 (H) 02/18/2022   EGFR 35 (L) 02/18/2022      --------------------------------------------------------------------------------------------------- Hypertension, follow-up  BP Readings from Last 3 Encounters:  08/27/22 (!) 145/69  02/23/22 (!) 179/71  02/18/22 (!) 145/83   Wt Readings from Last 3 Encounters:  08/27/22 145 lb (65.8 kg)  10/20/21 149 lb (67.6 kg)  04/08/21 141 lb (64 kg)     She was last seen for hypertension 6 months ago.  BP at that visit was 179/71. Management since that visit includes metoprolol '100mg'$  . Patient states she is concerned for being out of the house for the first time in months and meeting a  new physician  [place] states that she normally has blood pressure   She reports excellent compliance with treatment. She is not having side effects.  Outside blood pressures are not being taken.      Pertinent labs Lab Results  Component Value Date   CHOL 173 02/18/2022   HDL 39 (L) 02/18/2022   LDLCALC 70 02/18/2022   TRIG 408 (H) 02/18/2022   CHOLHDL 4.4 02/18/2022   Lab Results  Component Value Date   NA 134 02/18/2022   K 5.1 02/18/2022   CREATININE 1.43 (H) 02/18/2022   EGFR 35 (L) 02/18/2022   GLUCOSE 490 (H) 02/18/2022   TSH 1.460 02/18/2022     The ASCVD Risk score (Arnett DK, et al., 2019) failed to calculate for the following reasons:   The 2019 ASCVD risk  score is only valid for ages 3 to 35  ---------------------------------------------------------------------------------------------------   Medications: Outpatient Medications Prior to Visit  Medication Sig   acetaminophen (TYLENOL) 500 MG tablet Take 1,000 mg by mouth every 8 (eight) hours as needed for mild pain, moderate pain, fever or headache.   atorvastatin (LIPITOR) 20 MG tablet Take 20 mg by mouth at bedtime.    blood glucose meter kit and supplies Dispense based on patient and insurance preference. Use once daily as directed. (FOR ICD-10 E11.21).   Calcium-Vitamin D 600-200 MG-UNIT per tablet Take 600 tablets by  mouth daily with supper.    celecoxib (CELEBREX) 100 MG capsule TAKE 2 CAPSULES BY MOUTH DAILY AS NEEDED   digoxin (LANOXIN) 0.125 MG tablet Take 0.125 mg by mouth every other day.    ELIQUIS 2.5 MG TABS tablet Take 2.5 mg by mouth 2 (two) times daily.    ENTRESTO 97-103 MG Take 1 tablet by mouth 2 (two) times daily.    furosemide (LASIX) 20 MG tablet Take 20 mg by mouth daily.    gabapentin (NEURONTIN) 600 MG tablet TAKE 1 TABLET BY MOUTH 3 TIMES DAILY   imipramine (TOFRANIL) 25 MG tablet TAKE 1 TABLET BY MOUTH AT BEDTIME   Liniments (BLUE-EMU SUPER STRENGTH) CREA Apply topically.   loperamide (IMODIUM) 2 MG capsule Take 2 mg by mouth as needed.    Methylcellulose, Laxative, 500 MG TABS Take 2 tablets by mouth daily.   metoprolol succinate (TOPROL-XL) 100 MG 24 hr tablet TAKE ONE TABLET BY MOUTH EVERY DAY   Multiple Vitamins-Minerals (CENTRUM SILVER ULTRA WOMENS PO) Take 1 tablet by mouth daily with lunch.    MYRBETRIQ 50 MG TB24 tablet TAKE 1 TABLET BY MOUTH DAILY   nitrofurantoin, macrocrystal-monohydrate, (MACROBID) 100 MG capsule Take 1 capsule (100 mg total) by mouth 2 (two) times daily.   nystatin (NYAMYC) powder APPLY TOPICALLY FOUR TIMES A DAY   omeprazole (PRILOSEC) 20 MG capsule TAKE 1 CAPSULE BY MOUTH ONCE DAILY   Probiotic Product (ALIGN) 4 MG CAPS Take by mouth every other day.   sitaGLIPtin (JANUVIA) 100 MG tablet Take 1 tablet (100 mg total) by mouth every morning.   spironolactone (ALDACTONE) 25 MG tablet TAKE 1 TABLET BY MOUTH DAILY   [DISCONTINUED] SYNTHROID 50 MCG tablet Take 1 tablet (50 mcg total) by mouth daily before breakfast.   No facility-administered medications prior to visit.    Review of Systems     Objective    BP (!) 145/69 (BP Location: Right Arm, Patient Position: Sitting, Cuff Size: Normal)   Pulse 81   Temp 98.7 F (37.1 C) (Oral)   Wt 145 lb (65.8 kg)   SpO2 94%   BMI 22.71 kg/m    Physical Exam Vitals reviewed.  Constitutional:       General: She is not in acute distress.    Appearance: Normal appearance. She is not ill-appearing, toxic-appearing or diaphoretic.  Eyes:     Conjunctiva/sclera: Conjunctivae normal.     Comments: Ectotropion right inferior eye lid   Cardiovascular:     Rate and Rhythm: Normal rate and regular rhythm.     Pulses: Normal pulses.     Heart sounds: Normal heart sounds. No murmur heard.    No friction rub. No gallop.  Pulmonary:     Effort: Pulmonary effort is normal. No respiratory distress.     Breath sounds: Normal breath sounds. No stridor. No wheezing, rhonchi or rales.  Abdominal:     General: Bowel sounds  are normal. There is no distension.     Palpations: Abdomen is soft.     Tenderness: There is no abdominal tenderness.  Musculoskeletal:     Right lower leg: No edema.     Left lower leg: No edema.  Skin:    Findings: No erythema or rash.  Neurological:     Mental Status: She is alert and oriented to person, place, and time.       Results for orders placed or performed in visit on 08/27/22  POCT HgB A1C  Result Value Ref Range   Hemoglobin A1C 7.6 (A) 4.0 - 5.6 %   HbA1c POC (<> result, manual entry)     HbA1c, POC (prediabetic range)     HbA1c, POC (controlled diabetic range)      Assessment & Plan     Problem List Items Addressed This Visit       Cardiovascular and Mediastinum   Essential (primary) hypertension    BP elevated  Patient and family would prefer to meet with cardiologist prior to adjusting BP medications due to problems with extremes of BP in the past and patient's difficulty with adjustments to new regimens  She will continue current medications including entresto, lasix, spironolactone & metoprolol  Follow up with Dr. Humphrey Rolls as scheduled         Endocrine   Type 2 diabetes mellitus with diabetic nephropathy, without long-term current use of insulin ([place]) - Primary    Refills provided for lancet and strips  Last A1c within goal range of less  than 8.0 at 7.6  No changes to DM medications  She will continue Januvia '100mg'$  daily   Lab Results  Component Value Date   HGBA1C 7.6 (A) 08/27/2022        Relevant Orders   POCT HgB A1C (Completed)     Other   Healthcare maintenance    Recommended COVID and Shingrix vaccines  Patient and daughter declined       Other Visit Diagnoses     Hypothyroidism       Relevant Medications   SYNTHROID 50 MCG tablet        Return in about 6 months (around 02/25/2023) for well visit and labs.        The entirety of the information documented in the History of Present Illness, Review of Systems and Physical Exam were personally obtained by me. Portions of this information were initially documented by Eduard Clos, CMA  and reviewed by me for thoroughness and accuracy.Dawn Foster, MD     Dawn Foster, MD  [facility] [phone] (phone) [phone] (fax)  [place]

## 2022-08-27 ENCOUNTER — Ambulatory Visit: Payer: Medicare PPO | Admitting: Family Medicine

## 2022-08-27 ENCOUNTER — Encounter: Payer: Self-pay | Admitting: Family Medicine

## 2022-08-27 VITALS — BP 145/69 | HR 81 | Temp 98.7°F | Wt 145.0 lb

## 2022-08-27 DIAGNOSIS — Z0000 Encounter for general adult medical examination without abnormal findings: Secondary | ICD-10-CM | POA: Diagnosis not present

## 2022-08-27 DIAGNOSIS — E1121 Type 2 diabetes mellitus with diabetic nephropathy: Secondary | ICD-10-CM

## 2022-08-27 DIAGNOSIS — I10 Essential (primary) hypertension: Secondary | ICD-10-CM | POA: Diagnosis not present

## 2022-08-27 DIAGNOSIS — E039 Hypothyroidism, unspecified: Secondary | ICD-10-CM | POA: Diagnosis not present

## 2022-08-27 LAB — POCT GLYCOSYLATED HEMOGLOBIN (HGB A1C): Hemoglobin A1C: 7.6 % — AB (ref 4.0–5.6)

## 2022-08-27 MED ORDER — ACCU-CHEK AVIVA PLUS VI STRP: ORAL_STRIP | 12 refills | Status: DC

## 2022-08-27 MED ORDER — SYNTHROID 50 MCG PO TABS: 50.0000 ug | ORAL_TABLET | Freq: Every day | ORAL | 1 refills | Status: DC

## 2022-08-27 MED ORDER — ACCU-CHEK SOFTCLIX LANCETS MISC: 12 refills | Status: DC

## 2022-08-28 DIAGNOSIS — Z0000 Encounter for general adult medical examination without abnormal findings: Secondary | ICD-10-CM | POA: Insufficient documentation

## 2022-08-28 DIAGNOSIS — Z515 Encounter for palliative care: Secondary | ICD-10-CM | POA: Insufficient documentation

## 2022-08-28 NOTE — Assessment & Plan Note (Signed)
Recommended COVID and Shingrix vaccines  Patient and daughter declined

## 2022-08-28 NOTE — Assessment & Plan Note (Signed)
Refills provided for lancet and strips  Last A1c within goal range of less than 8.0 at 7.6  No changes to DM medications  She will continue Januvia '100mg'$  daily   Lab Results  Component Value Date   HGBA1C 7.6 (A) 08/27/2022

## 2022-08-28 NOTE — Assessment & Plan Note (Signed)
BP elevated  Patient and family would prefer to meet with cardiologist prior to adjusting BP medications due to problems with extremes of BP in the past and patient's difficulty with adjustments to new regimens  She will continue current medications including entresto, lasix, spironolactone & metoprolol  Follow up with Dr. Humphrey Rolls as scheduled

## 2022-08-30 ENCOUNTER — Inpatient Hospital Stay
Admission: EM | Admit: 2022-08-30 | Discharge: 2022-09-07 | DRG: 480 | Disposition: A | Discharge status: Skilled Nursing Facility | Payer: Medicare PPO | Attending: Internal Medicine | Admitting: Internal Medicine

## 2022-08-30 ENCOUNTER — Emergency Department: Payer: Medicare PPO

## 2022-08-30 ENCOUNTER — Other Ambulatory Visit: Payer: Self-pay

## 2022-08-30 ENCOUNTER — Encounter: Payer: Self-pay | Admitting: Internal Medicine

## 2022-08-30 DIAGNOSIS — Z91041 Radiographic dye allergy status: Secondary | ICD-10-CM

## 2022-08-30 DIAGNOSIS — Z85831 Personal history of malignant neoplasm of soft tissue: Secondary | ICD-10-CM

## 2022-08-30 DIAGNOSIS — Z888 Allergy status to other drugs, medicaments and biological substances status: Secondary | ICD-10-CM

## 2022-08-30 DIAGNOSIS — R451 Restlessness and agitation: Secondary | ICD-10-CM | POA: Diagnosis not present

## 2022-08-30 DIAGNOSIS — W010XXA Fall on same level from slipping, tripping and stumbling without subsequent striking against object, initial encounter: Secondary | ICD-10-CM | POA: Diagnosis present

## 2022-08-30 DIAGNOSIS — D62 Acute posthemorrhagic anemia: Secondary | ICD-10-CM | POA: Diagnosis not present

## 2022-08-30 DIAGNOSIS — Z8249 Family history of ischemic heart disease and other diseases of the circulatory system: Secondary | ICD-10-CM

## 2022-08-30 DIAGNOSIS — N1831 Chronic kidney disease, stage 3a: Secondary | ICD-10-CM | POA: Diagnosis present

## 2022-08-30 DIAGNOSIS — M9701XA Periprosthetic fracture around internal prosthetic right hip joint, initial encounter: Secondary | ICD-10-CM | POA: Diagnosis present

## 2022-08-30 DIAGNOSIS — Z79890 Hormone replacement therapy (postmenopausal): Secondary | ICD-10-CM

## 2022-08-30 DIAGNOSIS — I21A1 Myocardial infarction type 2: Secondary | ICD-10-CM | POA: Diagnosis not present

## 2022-08-30 DIAGNOSIS — K219 Gastro-esophageal reflux disease without esophagitis: Secondary | ICD-10-CM | POA: Diagnosis present

## 2022-08-30 DIAGNOSIS — Z841 Family history of disorders of kidney and ureter: Secondary | ICD-10-CM

## 2022-08-30 DIAGNOSIS — I4721 Torsades de pointes: Secondary | ICD-10-CM | POA: Diagnosis not present

## 2022-08-30 DIAGNOSIS — H9193 Unspecified hearing loss, bilateral: Secondary | ICD-10-CM

## 2022-08-30 DIAGNOSIS — Z833 Family history of diabetes mellitus: Secondary | ICD-10-CM

## 2022-08-30 DIAGNOSIS — K5900 Constipation, unspecified: Secondary | ICD-10-CM | POA: Diagnosis not present

## 2022-08-30 DIAGNOSIS — I4819 Other persistent atrial fibrillation: Secondary | ICD-10-CM | POA: Diagnosis present

## 2022-08-30 DIAGNOSIS — Z96651 Presence of right artificial knee joint: Secondary | ICD-10-CM | POA: Diagnosis present

## 2022-08-30 DIAGNOSIS — Z85828 Personal history of other malignant neoplasm of skin: Secondary | ICD-10-CM

## 2022-08-30 DIAGNOSIS — E875 Hyperkalemia: Secondary | ICD-10-CM | POA: Diagnosis present

## 2022-08-30 DIAGNOSIS — I130 Hypertensive heart and chronic kidney disease with heart failure and stage 1 through stage 4 chronic kidney disease, or unspecified chronic kidney disease: Secondary | ICD-10-CM | POA: Diagnosis present

## 2022-08-30 DIAGNOSIS — Z800 Family history of malignant neoplasm of digestive organs: Secondary | ICD-10-CM

## 2022-08-30 DIAGNOSIS — Z66 Do not resuscitate: Secondary | ICD-10-CM | POA: Diagnosis present

## 2022-08-30 DIAGNOSIS — S72001A Fracture of unspecified part of neck of right femur, initial encounter for closed fracture: Principal | ICD-10-CM | POA: Diagnosis present

## 2022-08-30 DIAGNOSIS — E1165 Type 2 diabetes mellitus with hyperglycemia: Secondary | ICD-10-CM | POA: Diagnosis present

## 2022-08-30 DIAGNOSIS — E1142 Type 2 diabetes mellitus with diabetic polyneuropathy: Secondary | ICD-10-CM | POA: Diagnosis present

## 2022-08-30 DIAGNOSIS — E039 Hypothyroidism, unspecified: Secondary | ICD-10-CM | POA: Diagnosis present

## 2022-08-30 DIAGNOSIS — Z90710 Acquired absence of both cervix and uterus: Secondary | ICD-10-CM

## 2022-08-30 DIAGNOSIS — Z823 Family history of stroke: Secondary | ICD-10-CM

## 2022-08-30 DIAGNOSIS — H903 Sensorineural hearing loss, bilateral: Secondary | ICD-10-CM | POA: Diagnosis present

## 2022-08-30 DIAGNOSIS — I10 Essential (primary) hypertension: Secondary | ICD-10-CM | POA: Diagnosis present

## 2022-08-30 DIAGNOSIS — Z791 Long term (current) use of non-steroidal anti-inflammatories (NSAID): Secondary | ICD-10-CM

## 2022-08-30 DIAGNOSIS — Z820 Family history of epilepsy and other diseases of the nervous system: Secondary | ICD-10-CM

## 2022-08-30 DIAGNOSIS — Z886 Allergy status to analgesic agent status: Secondary | ICD-10-CM

## 2022-08-30 DIAGNOSIS — I5021 Acute systolic (congestive) heart failure: Secondary | ICD-10-CM | POA: Diagnosis not present

## 2022-08-30 DIAGNOSIS — Z79899 Other long term (current) drug therapy: Secondary | ICD-10-CM | POA: Diagnosis not present

## 2022-08-30 DIAGNOSIS — I482 Chronic atrial fibrillation, unspecified: Secondary | ICD-10-CM

## 2022-08-30 DIAGNOSIS — Z885 Allergy status to narcotic agent status: Secondary | ICD-10-CM

## 2022-08-30 DIAGNOSIS — I4891 Unspecified atrial fibrillation: Secondary | ICD-10-CM | POA: Diagnosis present

## 2022-08-30 DIAGNOSIS — Z882 Allergy status to sulfonamides status: Secondary | ICD-10-CM

## 2022-08-30 DIAGNOSIS — I5022 Chronic systolic (congestive) heart failure: Secondary | ICD-10-CM | POA: Diagnosis present

## 2022-08-30 DIAGNOSIS — E1122 Type 2 diabetes mellitus with diabetic chronic kidney disease: Secondary | ICD-10-CM | POA: Diagnosis present

## 2022-08-30 DIAGNOSIS — I447 Left bundle-branch block, unspecified: Secondary | ICD-10-CM | POA: Diagnosis present

## 2022-08-30 DIAGNOSIS — I4821 Permanent atrial fibrillation: Secondary | ICD-10-CM | POA: Diagnosis not present

## 2022-08-30 DIAGNOSIS — Y92019 Unspecified place in single-family (private) house as the place of occurrence of the external cause: Secondary | ICD-10-CM

## 2022-08-30 DIAGNOSIS — Z8781 Personal history of (healed) traumatic fracture: Secondary | ICD-10-CM

## 2022-08-30 DIAGNOSIS — Z881 Allergy status to other antibiotic agents status: Secondary | ICD-10-CM

## 2022-08-30 DIAGNOSIS — Z8509 Personal history of malignant neoplasm of other digestive organs: Secondary | ICD-10-CM | POA: Diagnosis present

## 2022-08-30 DIAGNOSIS — Z7901 Long term (current) use of anticoagulants: Secondary | ICD-10-CM

## 2022-08-30 HISTORY — DX: Polyneuropathy, unspecified: G62.9

## 2022-08-30 LAB — IRON AND TIBC
Iron: 39 ug/dL (ref 28–170)
Saturation Ratios: 11 % (ref 10.4–31.8)
TIBC: 370 ug/dL (ref 250–450)
UIBC: 331 ug/dL

## 2022-08-30 LAB — CBC WITH DIFFERENTIAL/PLATELET
Abs Immature Granulocytes: 0.04 10*3/uL (ref 0.00–0.07)
Basophils Absolute: 0 10*3/uL (ref 0.0–0.1)
Basophils Relative: 0 %
Eosinophils Absolute: 0 10*3/uL (ref 0.0–0.5)
Eosinophils Relative: 0 %
HCT: 35.6 % — ABNORMAL LOW (ref 36.0–46.0)
Hemoglobin: 11.5 g/dL — ABNORMAL LOW (ref 12.0–15.0)
Immature Granulocytes: 0 %
Lymphocytes Relative: 12 %
Lymphs Abs: 1.1 10*3/uL (ref 0.7–4.0)
MCH: 31.9 pg (ref 26.0–34.0)
MCHC: 32.3 g/dL (ref 30.0–36.0)
MCV: 98.6 fL (ref 80.0–100.0)
Monocytes Absolute: 0.7 10*3/uL (ref 0.1–1.0)
Monocytes Relative: 7 %
Neutro Abs: 7.7 10*3/uL (ref 1.7–7.7)
Neutrophils Relative %: 81 %
Platelets: 131 10*3/uL — ABNORMAL LOW (ref 150–400)
RBC: 3.61 MIL/uL — ABNORMAL LOW (ref 3.87–5.11)
RDW: 13 % (ref 11.5–15.5)
WBC: 9.7 10*3/uL (ref 4.0–10.5)
nRBC: 0 % (ref 0.0–0.2)

## 2022-08-30 LAB — VITAMIN B12: Vitamin B-12: 614 pg/mL (ref 180–914)

## 2022-08-30 LAB — APTT: aPTT: 29 seconds (ref 24–36)

## 2022-08-30 LAB — URINALYSIS, ROUTINE W REFLEX MICROSCOPIC
Bilirubin Urine: NEGATIVE
Glucose, UA: NEGATIVE mg/dL
Hgb urine dipstick: NEGATIVE
Ketones, ur: NEGATIVE mg/dL
Nitrite: NEGATIVE
Protein, ur: NEGATIVE mg/dL
Specific Gravity, Urine: 1.013 (ref 1.005–1.030)
pH: 5 (ref 5.0–8.0)

## 2022-08-30 LAB — GLUCOSE, CAPILLARY
Glucose-Capillary: 190 mg/dL — ABNORMAL HIGH (ref 70–99)
Glucose-Capillary: 201 mg/dL — ABNORMAL HIGH (ref 70–99)
Glucose-Capillary: 216 mg/dL — ABNORMAL HIGH (ref 70–99)
Glucose-Capillary: 151 mg/dL — ABNORMAL HIGH (ref 70–99)

## 2022-08-30 LAB — TYPE AND SCREEN
ABO/RH(D): O NEG
Antibody Screen: NEGATIVE

## 2022-08-30 LAB — BASIC METABOLIC PANEL
Anion gap: 10 (ref 5–15)
BUN: 24 mg/dL — ABNORMAL HIGH (ref 8–23)
CO2: 25 mmol/L (ref 22–32)
Calcium: 9 mg/dL (ref 8.9–10.3)
Chloride: 103 mmol/L (ref 98–111)
Creatinine, Ser: 0.95 mg/dL (ref 0.44–1.00)
GFR, Estimated: 57 mL/min — ABNORMAL LOW (ref >60)
Glucose, Bld: 217 mg/dL — ABNORMAL HIGH (ref 70–99)
Potassium: 4.2 mmol/L (ref 3.5–5.1)
Sodium: 138 mmol/L (ref 135–145)

## 2022-08-30 LAB — PROTIME-INR
INR: 1.2 (ref 0.8–1.2)
Prothrombin Time: 15.5 seconds — ABNORMAL HIGH (ref 11.4–15.2)

## 2022-08-30 LAB — FOLATE: Folate: >40 ng/mL (ref >5.9)

## 2022-08-30 MED ORDER — SACUBITRIL-VALSARTAN 97-103 MG PO TABS
1.0000 | ORAL_TABLET | Freq: Two times a day (BID) | ORAL | Status: DC
  Administered 2022-08-30 – 2022-09-03 (×8): 1 via ORAL
  Filled 2022-08-30 (×12): qty 1

## 2022-08-30 MED ORDER — HYDROCODONE-ACETAMINOPHEN 5-325 MG PO TABS
1.0000 | ORAL_TABLET | Freq: Four times a day (QID) | ORAL | Status: DC | PRN
  Administered 2022-08-30: 1 via ORAL
  Filled 2022-08-30: qty 1

## 2022-08-30 MED ORDER — MORPHINE SULFATE (PF) 2 MG/ML IV SOLN
1.0000 mg | INTRAVENOUS | Status: DC | PRN
  Administered 2022-08-30 – 2022-09-01 (×3): 1 mg via INTRAVENOUS
  Filled 2022-08-30 (×3): qty 1

## 2022-08-30 MED ORDER — ONDANSETRON HCL 4 MG/2ML IJ SOLN
4.0000 mg | Freq: Once | INTRAMUSCULAR | Status: AC
  Administered 2022-08-30: 4 mg via INTRAVENOUS
  Filled 2022-08-30: qty 2

## 2022-08-30 MED ORDER — INSULIN ASPART 100 UNIT/ML IJ SOLN
0.0000 [IU] | Freq: Three times a day (TID) | INTRAMUSCULAR | Status: DC
  Administered 2022-08-30: 5 [IU] via SUBCUTANEOUS
  Administered 2022-08-30: 3 [IU] via SUBCUTANEOUS
  Administered 2022-08-30 – 2022-09-01 (×2): 5 [IU] via SUBCUTANEOUS
  Administered 2022-09-01: 8 [IU] via SUBCUTANEOUS
  Administered 2022-09-01 – 2022-09-02 (×2): 3 [IU] via SUBCUTANEOUS
  Administered 2022-09-02: 5 [IU] via SUBCUTANEOUS
  Administered 2022-09-02: 3 [IU] via SUBCUTANEOUS
  Administered 2022-09-03: 8 [IU] via SUBCUTANEOUS
  Administered 2022-09-03 – 2022-09-04 (×4): 5 [IU] via SUBCUTANEOUS
  Administered 2022-09-04: 8 [IU] via SUBCUTANEOUS
  Administered 2022-09-05 (×2): 3 [IU] via SUBCUTANEOUS
  Administered 2022-09-05: 8 [IU] via SUBCUTANEOUS
  Administered 2022-09-06: 3 [IU] via SUBCUTANEOUS
  Administered 2022-09-06 – 2022-09-07 (×3): 5 [IU] via SUBCUTANEOUS
  Administered 2022-09-07: 3 [IU] via SUBCUTANEOUS
  Filled 2022-08-30 (×21): qty 1

## 2022-08-30 MED ORDER — SPIRONOLACTONE 25 MG PO TABS
25.0000 mg | ORAL_TABLET | Freq: Every day | ORAL | Status: DC
  Administered 2022-08-30 – 2022-09-03 (×4): 25 mg via ORAL
  Filled 2022-08-30 (×4): qty 1

## 2022-08-30 MED ORDER — ACETAMINOPHEN 325 MG PO TABS
650.0000 mg | ORAL_TABLET | Freq: Four times a day (QID) | ORAL | Status: DC | PRN
  Administered 2022-08-30 (×2): 650 mg via ORAL
  Filled 2022-08-30 (×2): qty 2

## 2022-08-30 MED ORDER — LEVOTHYROXINE SODIUM 50 MCG PO TABS
50.0000 ug | ORAL_TABLET | Freq: Every day | ORAL | Status: DC
  Administered 2022-08-30 – 2022-09-07 (×7): 50 ug via ORAL
  Filled 2022-08-30 (×7): qty 1

## 2022-08-30 MED ORDER — ATORVASTATIN CALCIUM 20 MG PO TABS
20.0000 mg | ORAL_TABLET | Freq: Every day | ORAL | Status: DC
  Administered 2022-08-30 – 2022-09-06 (×7): 20 mg via ORAL
  Filled 2022-08-30 (×8): qty 1

## 2022-08-30 MED ORDER — DIGOXIN 125 MCG PO TABS
0.1250 mg | ORAL_TABLET | ORAL | Status: DC
  Administered 2022-08-30: 0.125 mg via ORAL
  Filled 2022-08-30 (×2): qty 1

## 2022-08-30 MED ORDER — METOPROLOL SUCCINATE ER 100 MG PO TB24
100.0000 mg | ORAL_TABLET | Freq: Every day | ORAL | Status: DC
  Administered 2022-08-30 – 2022-09-07 (×8): 100 mg via ORAL
  Filled 2022-08-30: qty 1
  Filled 2022-08-30: qty 2
  Filled 2022-08-30 (×6): qty 1

## 2022-08-30 MED ORDER — INSULIN ASPART 100 UNIT/ML IJ SOLN
0.0000 [IU] | Freq: Every day | INTRAMUSCULAR | Status: DC
  Administered 2022-08-31 – 2022-09-02 (×3): 3 [IU] via SUBCUTANEOUS
  Filled 2022-08-30 (×4): qty 1

## 2022-08-30 MED ORDER — FUROSEMIDE 20 MG PO TABS
20.0000 mg | ORAL_TABLET | Freq: Every day | ORAL | Status: DC
  Administered 2022-08-30 – 2022-09-07 (×8): 20 mg via ORAL
  Filled 2022-08-30 (×8): qty 1

## 2022-08-30 MED ORDER — MORPHINE SULFATE (PF) 4 MG/ML IV SOLN
4.0000 mg | INTRAVENOUS | Status: AC | PRN
  Administered 2022-08-30 (×2): 4 mg via INTRAVENOUS
  Filled 2022-08-30 (×2): qty 1

## 2022-08-30 MED ORDER — DIGOXIN 125 MCG PO TABS: 0.1250 mg | ORAL_TABLET | ORAL | Status: DC

## 2022-08-30 NOTE — Assessment & Plan Note (Signed)
Chronic anticoagulation Hold Eliquis for anticipated surgery Continue metoprolol

## 2022-08-30 NOTE — Plan of Care (Signed)

## 2022-08-30 NOTE — Assessment & Plan Note (Signed)
Renal function at baseline 

## 2022-08-30 NOTE — Assessment & Plan Note (Signed)
No acute issues suspected 

## 2022-08-30 NOTE — IPAL (Signed)
  Interdisciplinary Goals of Care Family Meeting   Date carried out: 08/30/2022  Location of the meeting: Bedside  Member's involved: Physician and Family Member or next of kin, daughter Marcy Panning Power of Attorney or acting medical decision maker: Patient and daughter Nilsa Nutting  Discussion: We discussed goals of care for [employer] .   I have reviewed medical records including EPIC notes, labs and imaging, assessed the patient and then met with patient and daughter to discuss major active diagnoses, plan of care, natural trajectory, prognosis, GOC, EOL wishes, disposition and options including Full code/DNI/DNR and the concept of comfort care if DNR is elected. Questions and concerns were addressed. They are  in agreement to continue current plan of care  DNR status as per prior hospitalization in 2016 was confirmed   Code status:   Code Status: DNR   Disposition: Continue current acute care  Time spent for the meeting: [place], MD  08/30/2022, 5:50 AM

## 2022-08-30 NOTE — Plan of Care (Signed)
Patient was seen and examined at bedside, admitted last night due to fall and fracture of right femur.  Patient was seen by orthopedic surgery, plan for surgical intervention tomorrow a.m.  Patient was seen by cardiology as well for risk stratification, preop clearance due to history of A-fib and CHF.  Echocardiogram is pending. Patient was complaining of choking while eating so SLP was consulted.  We will continue to monitor on telemetry, continue to hold Eliquis. Will continue current treatment and follow along.

## 2022-08-30 NOTE — Consult Note (Signed)
[facility] Cardiology  CARDIOLOGY CONSULT NOTE  Patient ID: Dawn Burnett MRN: [id number] DOB/AGE: [birth year] 89 y.o.  Admit date: 08/30/2022 Referring Physician Dwyane Dee Primary Physician Simmons-Robinson Primary Cardiologist Humphrey Rolls Reason for Consultation preoperative cardiovascular evaluation  HPI: 89 year old female referred for preoperative cardiovascular evaluation.  Patient presented to [facility] emergency room 08/30/2022 after falling and suffering a periprosthetic right proximal femur fracture.  Patient currently is followed by Dr.Khan chronic atrial fibrillation, on Eliquis for stroke prevention.  According to the daughter, last Eliquis dose was evening of 08/29/2022.  Patient has a history of chronic systolic congestive heart failure, with 2D echocardiogram 02/21/2019 revealing mild reduced left ventricular function, with LVEF 36%.  Patient currently denies chest pain or shortness of breath.  ECG reveals atrial fibrillation at a rate of 92 bpm with underlying left bundle branch block.  Review of systems complete and found to be negative unless listed above     Past Medical History:  Diagnosis Date   Arthritis    Atrial fibrillation (HCC)    CHF (congestive heart failure) (HCC)    Diabetes mellitus without complication (HCC)    GERD (gastroesophageal reflux disease)    Hiatal hernia    Hypertension    Neuropathy    Skin cancer, basal cell    Thyroid disease     Past Surgical History:  Procedure Laterality Date   ABDOMINAL HYSTERECTOMY     APPENDECTOMY     BREAST SURGERY     cateract extraction     EYE SURGERY     done by Dr. 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   prn at prn   atorvastatin (LIPITOR) 20 MG tablet Take 20 mg by mouth at bedtime.    08/29/2022 at 2030   Calcium-Vitamin D 600-200 MG-UNIT per tablet Take 600 tablets by mouth daily with supper.    08/29/2022 at 1800   celecoxib (CELEBREX) 100 MG capsule TAKE 2 CAPSULES BY MOUTH DAILY AS NEEDED 90 capsule 1 08/29/2022 at 0800   digoxin (LANOXIN) 0.125 MG tablet Take 0.125 mg by mouth every other day.    08/28/2022 at 0800   ELIQUIS 2.5 MG TABS tablet Take 2.5 mg by mouth 2 (two) times daily.    08/29/2022 at 1800   ENTRESTO 97-103 MG Take 1 tablet by mouth 2 (two) times daily.    08/29/2022 at 2030   furosemide (LASIX) 20 MG tablet Take 20 mg by mouth daily.    08/29/2022 at 0800   gabapentin (NEURONTIN) 600 MG tablet TAKE 1 TABLET BY MOUTH 3 TIMES DAILY 90 tablet 5 08/29/2022 at 1800   imipramine (TOFRANIL) 25 MG tablet TAKE 1 TABLET BY MOUTH AT BEDTIME 90 tablet 3 08/29/2022 at 2030   Liniments (BLUE-EMU SUPER STRENGTH) CREA Apply topically.   prn at prn   loperamide (IMODIUM) 2 MG capsule Take 2 mg by mouth as needed.    prn at prn   Methylcellulose, Laxative, 500 MG TABS Take 2 tablets by mouth daily.   08/29/2022 at 1400   metoprolol succinate (TOPROL-XL)  100 MG 24 hr tablet TAKE ONE TABLET BY MOUTH EVERY DAY 90 tablet 3 08/29/2022 at 0800   Multiple Vitamins-Minerals (CENTRUM SILVER ULTRA WOMENS PO) Take 1 tablet by mouth daily with lunch.    08/29/2022 at 1400   MYRBETRIQ 50 MG TB24 tablet TAKE 1 TABLET BY MOUTH DAILY 90 tablet 3 08/29/2022 at 2030   omeprazole (PRILOSEC) 20 MG capsule TAKE 1 CAPSULE BY MOUTH ONCE DAILY 90 capsule 3 08/29/2022 at 0700   Probiotic Product (ALIGN) 4 MG CAPS Take by mouth every other day.   08/29/2022 at 1800   sitaGLIPtin (JANUVIA) 100 MG tablet Take 1 tablet (100 mg total) by mouth every morning. 30 tablet 2 08/29/2022 at 0800   spironolactone (ALDACTONE) 25 MG tablet TAKE 1 TABLET BY MOUTH DAILY 90 tablet 3 08/29/2022 at 1400    SYNTHROID 50 MCG tablet Take 1 tablet (50 mcg total) by mouth daily before breakfast. 90 tablet 1 08/29/2022 at 0700   Accu-Chek Softclix Lancets lancets Use as instructed 100 each 12    blood glucose meter kit and supplies Dispense based on patient and insurance preference. Use once daily as directed. (FOR ICD-10 E11.21). 1 each 0    glucose blood (ACCU-CHEK AVIVA PLUS) test strip Use as instructed 100 each 12    nitrofurantoin, macrocrystal-monohydrate, (MACROBID) 100 MG capsule Take 1 capsule (100 mg total) by mouth 2 (two) times daily. (Patient not taking: Reported on 08/30/2022) 12 capsule 0 Completed Course   nystatin (NYAMYC) powder APPLY TOPICALLY FOUR TIMES A DAY (Patient not taking: Reported on 08/30/2022) 15 g 0 Completed Course   Social History   Socioeconomic History   Marital status: Widowed    Spouse name: widowed   Number of children: 2   Years of education: 12   Highest education level: 12th grade  Occupational History   Occupation: retired  Tobacco Use   Smoking status: Never   Smokeless tobacco: Never  Vaping Use   Vaping Use: Never used  Substance and Sexual Activity   Alcohol use: No   Drug use: No   Sexual activity: Never  Other Topics Concern   Not on file  Social History Narrative   Not on file   Social Determinants of Health   Financial Resource Strain: Low Risk  (01/24/2020)   Overall Financial Resource Strain (CARDIA)    Difficulty of Paying Living Expenses: Not hard at all  Food Insecurity: No Food Insecurity (08/30/2022)   Hunger Vital Sign    Worried About Running Out of Food in the Last Year: Never true    [place] in the Last Year: Never true  Transportation Needs: No Transportation Needs (08/30/2022)   PRAPARE - Hydrologist (Medical): No    Lack of Transportation (Non-Medical): No  Physical Activity: Inactive (01/24/2020)   Exercise Vital Sign    Days of Exercise per Week: 0 days    Minutes of Exercise per  Session: 0 min  Stress: No Stress Concern Present (01/24/2020)   [place]    Feeling of Stress : Only a little  Social Connections: Socially Isolated (01/24/2020)   Social Connection and Isolation Panel [NHANES]    Frequency of Communication with Friends and Family: More than three times a week    Frequency of Social Gatherings with Friends and Family: More than three times a week    Attends Religious Services: Never    Active Member of  Clubs or Organizations: No    Attends Archivist Meetings: Never    Marital Status: Widowed  Intimate Partner Violence: Not At Risk (08/30/2022)   Humiliation, Afraid, Rape, and Kick questionnaire    Fear of Current or Ex-Partner: No    Emotionally Abused: No    Physically Abused: No    Sexually Abused: No    Family History  Problem Relation Age of Onset   Heart disease Mother    Hypertension Mother    Heart attack Mother    Stomach cancer Father    Liver cancer Brother    Hypertension Son    Huntington's disease Sister    Diabetes Sister    Kidney disease Sister    Congestive Heart Failure Sister    Pneumonia Sister    Stroke Brother    Parkinson's disease Brother    Hypertension Brother    Congestive Heart Failure Brother       Review of systems complete and found to be negative unless listed above      PHYSICAL EXAM  General: Well developed, well nourished, in no acute distress HEENT:  Normocephalic and atramatic Neck:  No JVD.  Lungs: Clear bilaterally to auscultation and percussion. Heart: HRRR . Normal S1 and S2 without gallops or murmurs.  Abdomen: Bowel sounds are positive, abdomen soft and non-tender  Msk:  Back normal, normal gait. Normal strength and tone for age. Extremities: No clubbing, cyanosis or edema.   Neuro: Alert and oriented X 3. Psych:  Good affect, responds appropriately  Labs:   Lab Results  Component Value Date   WBC 9.7  08/30/2022   HGB 11.5 (L) 08/30/2022   HCT 35.6 (L) 08/30/2022   MCV 98.6 08/30/2022   PLT 131 (L) 08/30/2022    Recent Labs  Lab 08/30/22 0238  NA 138  K 4.2  CL 103  CO2 25  BUN 24*  CREATININE 0.95  CALCIUM 9.0  GLUCOSE 217*   Lab Results  Component Value Date   CKTOTAL 46 12/09/2017   TROPONINI 0.02 03/03/2017    Lab Results  Component Value Date   CHOL 173 02/18/2022   CHOL 156 04/08/2021   CHOL 174 12/14/2016   Lab Results  Component Value Date   HDL 39 (L) 02/18/2022   HDL 49 04/08/2021   HDL 44 12/14/2016   Lab Results  Component Value Date   LDLCALC 70 02/18/2022   LDLCALC 78 04/08/2021   LDLCALC 77 12/14/2016   Lab Results  Component Value Date   TRIG 408 (H) 02/18/2022   TRIG 172 (H) 04/08/2021   TRIG 263 (H) 12/14/2016   Lab Results  Component Value Date   CHOLHDL 4.4 02/18/2022   CHOLHDL 3.2 04/08/2021   CHOLHDL 4.0 12/14/2016   No results found for: "LDLDIRECT"    Radiology: CT FEMUR RIGHT WO CONTRAST  Result Date: 08/30/2022 CLINICAL DATA:  Right femoral fracture EXAM: CT OF THE LOWER RIGHT EXTREMITY WITHOUT CONTRAST TECHNIQUE: Multidetector CT imaging of the right lower extremity was performed according to the standard protocol. RADIATION DOSE REDUCTION: This exam was performed according to the departmental dose-optimization program which includes automated exposure control, adjustment of the mA and/or kV according to patient size and/or use of iterative reconstruction technique. COMPARISON:  None Available. FINDINGS: Bones/Joint/Cartilage Right hip bipolar hemiarthroplasty has been performed. There is an oblique periprosthetic fracture of the proximal femoral diaphysis involving the distal femoral stem component. In this region, there is endosteal scalloping and thinning of the  femoral diaphyseal cortex, particularly posteromedially, best appreciated on coronal image # 121/10 and axial image # 131/4 which may relate to motion, aggressive  granulomatosis or infection. Fracture plane extends through the methylmethacrylate plug at the distal tip of the femoral stem component. Distal fracture fragment demonstrates 1/2 shaft with lateral displacement and mild medial angulation. No hip dislocation. Right total knee arthroplasty noted with arthroplasty components in expected alignment. Osseous structures are diffusely osteopenic. Ligaments Suboptimally assessed by CT. Muscles and Tendons Moderate diffuse fatty atrophy. Streak artifact limits evaluation of the psoas tendon. Gluteal, adductor and hamstring tendons appear intact. Quadriceps and patellar tendons appear intact. Soft tissues Hyperdense intramuscular hematoma surrounds the fracture plane within the vastus musculature. Advanced vascular calcifications noted within the lower extremity arterial outflow. Scattered calcifications are also noted within the right popliteal and peripheral femoral vein likely the sequela of remote thrombosis or inflammation. IMPRESSION: 1. Oblique periprosthetic fracture of the proximal femoral diaphysis involving the distal femoral stem component of the right hip bipolar hemiarthroplasty. In this region, there is endosteal scalloping and thinning of the femoral diaphyseal cortex, particularly posteromedially, which may relate to motion, aggressive granulomatosis or infection. 2. Hyperdense intramuscular hematoma surrounds the fracture plane within the vastus musculature. 3. Right total knee arthroplasty with arthroplasty components in expected alignment. 4. Advanced vascular calcifications within the lower extremity arterial outflow. 5. Scattered calcifications within the right popliteal and peripheral femoral vein likely the sequela of remote thrombosis or inflammation. Electronically Signed   By: Fidela Salisbury M.D.   On: 08/30/2022 04:16   DG FEMUR, MIN 2 VIEWS RIGHT  Result Date: 08/30/2022 CLINICAL DATA:  Right femur fracture EXAM: RIGHT FEMUR 2 VIEWS COMPARISON:   None Available. FINDINGS: There is no fracture or dislocation of the distal right femur. Periprosthetic fracture of the proximal right femur is unchanged. The components of the right total knee arthroplasty are normally position. IMPRESSION: Periprosthetic fracture of the proximal right femur is unchanged. No fracture or dislocation of the distal right femur. Electronically Signed   By: Ulyses Jarred M.D.   On: 08/30/2022 03:40   DG Hip Unilat W or Wo Pelvis 2-3 Views Right  Result Date: 08/30/2022 CLINICAL DATA:  Fall EXAM: DG HIP (WITH OR WITHOUT PELVIS) 2-3V RIGHT COMPARISON:  None Available. FINDINGS: Right hip hemiarthroplasty. There is a periprosthetic fracture with medial angulation and oblique orientation at the distal aspect of the prosthetic femoral stem. No hip dislocation. IMPRESSION: Periprosthetic fracture with medial angulation and oblique orientation at the distal aspect of the prosthetic femoral stem. Electronically Signed   By: Ulyses Jarred M.D.   On: 08/30/2022 02:33   DG Chest Port 1 View  Result Date: 08/30/2022 CLINICAL DATA:  Preop chest radiograph. EXAM: PORTABLE CHEST 1 VIEW COMPARISON:  Chest radiograph dated 05/21/2015. FINDINGS: No focal consolidation, pleural effusion, or pneumothorax. Mild cardiomegaly. There is calcification of the mitral annulus. Atherosclerotic calcification of the aortic arch. Degenerative changes of the spine. No acute osseous pathology. IMPRESSION: 1. No active disease. 2. Mild cardiomegaly. Electronically Signed   By: Anner Crete M.D.   On: 08/30/2022 02:32    EKG: Atrial fibrillation with left bundle branch block at a rate of 92 bpm  ASSESSMENT AND PLAN:   1.  Perioperative cardiovascular evaluation prior to surgery for periprosthetic right proximal femur fracture to falling.  Currently denies chest pain or shortness of breath.  ECG reveals fibrillation with ventricular rate at 92 bpm with left bundle branch block.  The patient has a  history  of chronic systolic congestive heart failure with most recent 2D echocardiogram/14/2020 revealing LVEF 36%.  Patient has chronic kidney disease stage IIIa.  Patient likely has at least mild to moderate risk for cardiovascular complication during surgery based on her age, neck systolic congestive heart failure, reported LV function, and chronic kidney disease.  I discussed in detail with patient and patient's daughter about cardiovascular risk of surgery versus no surgery and  prolonged bed rest with likelihood of poor clinical outcome, and the patient and patient's daughter are willing to proceed with surgery. 2.  Chronic atrial fibrillation, well-controlled, on Eliquis for stroke prevention, and metoprolol succinate and digoxin for rate control. 3.  Chronic systolic congestive heart failure, with reported LV ejection fraction 36%, on good medical management(metoprolol succinate, Entresto, spironolactone, and furosemide)  Recommendations  1.  Agree with current therapy 2.  Proceed with surgery as planned for 08/31/2022 3.  Patient with Eliquis today and tomorrow morning, resume as reasonably safe after surgery 4.  Reviewed 2D echocardiogram today 5.  Continue metoprolol succinate pre-, peri and postoperatively  Signed: Isaias Cowman MD,PhD, [facility] 08/30/2022, 10:37 AM

## 2022-08-30 NOTE — Assessment & Plan Note (Signed)
Continue levothyroxine 

## 2022-08-30 NOTE — ED Provider Notes (Signed)
[facility] Provider Note    Event Date/Time   First MD Initiated Contact with Patient 08/30/22 [phone]     (approximate)   History   Hip Pain and Fall   HPI Level 5 caveat: History is limited by the patient being hard of hearing and not having her hearing aids in place.   Dawn Burnett is a 89 y.o. female who arrives by EMS for evaluation after a fall.  Reportedly she lives at home and got up from bed to go to the bathroom and slipped and fell, landing on her right hip.  She says she did not hit her head.  She has some blood around her right eye but she said that "sometimes my eye just bleeds".  She has no pain in her head, neck, chest, nor abdomen.  She did not land on her arms.  Her only pain is in her right hip which is severe when she tries to move.  It is better if she lies still.  No numbness nor tingling.  She has A-fib and takes Eliquis.     Physical Exam   ED Triage Vitals  Enc Vitals Group     BP 08/30/22 0155 (!) 176/77     Pulse Rate 08/30/22 0155 86     Resp 08/30/22 0155 17     Temp 08/30/22 0234 97.7 F (36.5 C)     Temp Source 08/30/22 0234 Oral     SpO2 08/30/22 0155 97 %     Weight --      Height --      Head Circumference --      Peak Flow --      Pain Score 08/30/22 0204 8     Pain Loc --      Pain Edu? --      Excl. in [place]? --      Most recent vital signs: Vitals:   08/30/22 0330 08/30/22 0400  BP: (!) 174/71 136/78  Pulse: (!) 102 84  Resp: 20 16  Temp:    SpO2: 96% 90%     General: Awake, alert, hard of hearing.  Elderly, appears uncomfortable but nontoxic. CV:  Good peripheral perfusion.  Irregularly irregular rhythm, normal rate. Resp:  Normal effort.  Lungs are clear to auscultation.  Patient is speaking easily and clearly, no accessory muscle usage. Abd:  No distention.  No tenderness to palpation. Other:  The patient's right leg is internally rotated and slightly shortened.  When I try to range it at all,  she has severe pain and tenderness in her proximal humerus.  There seems to be some swelling/deformity at the area of the proximal humerus.   ED Results / Procedures / Treatments   Labs (all labs ordered are listed, but only abnormal results are displayed) Labs Reviewed  BASIC METABOLIC PANEL - Abnormal; Notable for the following components:      Result Value   Glucose, Bld 217 (*)    BUN 24 (*)    GFR, Estimated 57 (*)    All other components within normal limits  CBC WITH DIFFERENTIAL/PLATELET - Abnormal; Notable for the following components:   RBC 3.61 (*)    Hemoglobin 11.5 (*)    HCT 35.6 (*)    Platelets 131 (*)    All other components within normal limits  PROTIME-INR - Abnormal; Notable for the following components:   Prothrombin Time 15.5 (*)    All other components within normal limits  APTT  URINALYSIS, ROUTINE W REFLEX MICROSCOPIC  TYPE AND SCREEN     EKG  ED ECG REPORT I, Hinda Kehr, the attending physician, personally viewed and interpreted this ECG.  Date: 08/30/2022 EKG Time: 2:40 AM Rate: 79 Rhythm: Atrial fibrillation QRS Axis: normal Intervals: Left bundle branch block ST/T Wave abnormalities: Non-specific ST segment / T-wave changes, but no clear evidence of acute ischemia. Narrative Interpretation: no definitive evidence of acute ischemia; does not meet STEMI criteria.    RADIOLOGY I viewed and interpreted the patient's right hip x-rays.  She has a right periprosthetic proximal femur fracture with substantial angulation.  Radiology report confirms the findings.  I also viewed and interpreted the patient's 1 view chest x-ray.  No evidence of pneumonia or pulmonary edema.  I also read the radiologist's report, which confirmed no acute findings.  Femur x-rays and CT femur ordered/recommended by Dr. Karel Jarvis, not personally reviewed or interpreted by me prior to admission.    PROCEDURES:  Critical Care performed: No  .1-3 Lead EKG  Interpretation  Performed by: Hinda Kehr, MD Authorized by: Hinda Kehr, MD     Interpretation: abnormal     ECG rate:  82   ECG rate assessment: normal     Rhythm: atrial fibrillation     Ectopy: none     Conduction: normal      MEDICATIONS ORDERED IN ED: Medications  morphine (PF) 4 MG/ML injection 4 mg (4 mg Intravenous Given 08/30/22 0244)  ondansetron (ZOFRAN) injection 4 mg (4 mg Intravenous Given 08/30/22 0243)     IMPRESSION / MDM / ASSESSMENT AND PLAN / ED COURSE  I reviewed the triage vital signs and the nursing notes.                              Differential diagnosis includes, but is not limited to, hip fracture, dislocation, disruption of prior hardware, electrolyte or metabolic abnormality, intracranial injury.  Patient's presentation is most consistent with acute presentation with potential threat to life or bodily function.  Lab/studies ordered: Basic metabolic panel, pro time-INR, APTT, CBC with differential, type and screen, urinalysis, hip/pelvis x-rays (right), one-view portable chest x-ray, EKG.  The patient is on the cardiac monitor to evaluate for evidence of arrhythmia and/or significant heart rate changes.  As documented above, patient has a substantial fracture of her proximal right femur around pre-existing hardware from a fracture 20+ years ago.  Fortunately she is neurovascularly intact.  I will consult orthopedic surgery.  Labs are all essentially normal with no concerning abnormalities.  Patient's daughter is at bedside and I verified that her last dose of Eliquis 2.5 mg was last night at dinner.   Clinical Course as of 08/30/22 0401  Sun Aug 30, 2022  [id number] I consulted Dr. Karel Jarvis by phone.  He reviewed the imaging and we discussed the case.  He said that he would be able to surgically repair this fracture, but it will need to wait 24+ hours due to the Eliquis.  He recommended hospitalist admission, okay for patient to eat today, and he  requested that I order a CT of the femur for operative planning as well as some femur x-rays that he ordered.  I updated the patient and her daughter regarding the plan.  Labs are still pending and then I will consult the hospitalist. [CF]  0401 Consulted Dr. Damita Dunnings with the hospitalist service.  She will admit [CF]    Clinical  Course User Index [CF] Hinda Kehr, MD     FINAL CLINICAL IMPRESSION(S) / ED DIAGNOSES   Final diagnoses:  Closed fracture of proximal end of femur, right, initial encounter [facility])  Chronic atrial fibrillation ([place])  Current use of long term anticoagulation  Bilateral hearing loss, unspecified hearing loss type     Rx / DC Orders   ED Discharge Orders     None        Note:  This document was prepared using Dragon voice recognition software and may include unintentional dictation errors.   Hinda Kehr, MD 08/30/22 [phone]

## 2022-08-30 NOTE — H&P (Signed)
History and Physical    Patient: Dawn Burnett QHU:[id number] DOB: 03-Jan-1929 DOA: 08/30/2022 DOS: the patient was seen and examined on 08/30/2022 PCP: [name], MD  Patient coming from: Home  Chief Complaint:  Chief Complaint  Patient presents with   Hip Pain   Fall    HPI: Dawn Burnett is a 89 y.o. female with medical history significant for Atrial fibrillation on Eliquis, HTN, history of gastrointestinal stromal tumor, CKD 3a, systolic CHF (EF 46% 12/6810), hypothyroidism and non-insulin-dependent type 2 diabetes who presents to the ED following an accidental fall after she slipped when she got up to go to the bathroom, landing on her right hip.  She denies hitting anywhere else and experiences pain only in her right hip.  History is provided by daughter at bedside as patient is very hard of hearing. ED course and data review: BP 176/77 with otherwise normal vitals.  Labs notable for glucose 217, hemoglobin 11.5EKG, personally viewed and interpreted showing A-fib at 92 with LBBB.  X-ray showing periprosthetic right proximal femur fracture.  Chest x-ray was clear.  The ED provider spoke with orthopedist, Dr. Karel Jarvis who will take patient to the OR following a 24-hour Eliquis washout.  Hospitalist consulted for admission.   Review of Systems: As mentioned in the history of present illness. All other systems reviewed and are negative.  Past Medical History:  Diagnosis Date   Arthritis    Atrial fibrillation (HCC)    CHF (congestive heart failure) (HCC)    Diabetes mellitus without complication (HCC)    GERD (gastroesophageal reflux disease)    Hiatal hernia    Hypertension    Skin cancer, basal cell    Thyroid disease    Past Surgical History:  Procedure Laterality Date   ABDOMINAL HYSTERECTOMY     APPENDECTOMY     BREAST SURGERY     cateract extraction     EYE SURGERY     done by Dr. Vickki Muff   eyelid     drooping right eyelid   KNEE SURGERY     REMOVAL OF  GASTROINTESTINAL STOMATIC  TUMOR OF STOMACH  06/19/2009   right hip surgery     right total knee replacement     Social History:  reports that she has never smoked. She has never used smokeless tobacco. She reports that she does not drink alcohol and does not use drugs.  Allergies  Allergen Reactions   Iodinated Contrast Media Rash   Augmentin [Amoxicillin-Pot Clavulanate] Other (See Comments)    Gi distress    Codeine    Crestor [Rosuvastatin] Other (See Comments)    Unknown Unknown   Levaquin [Levofloxacin In D5w]     Stomach pain, body pain/ache all over Stomach pain, body pain/ache all over   Naproxen Other (See Comments)    unknown unknown   Prednisone    Sulfa Antibiotics    Sulfa Antibiotics Other (See Comments)   Sulfacetamide Sodium    Tolectin [Tolmetin]     Family History  Problem Relation Age of Onset   Heart disease Mother    Hypertension Mother    Heart attack Mother    Stomach cancer Father    Liver cancer Brother    Hypertension Son    Huntington's disease Sister    Diabetes Sister    Kidney disease Sister    Congestive Heart Failure Sister    Pneumonia Sister    Stroke Brother    Parkinson's disease Brother    Hypertension  Brother    Congestive Heart Failure Brother     Prior to Admission medications   Medication Sig Start Date End Date Taking? Authorizing Provider  Accu-Chek Softclix Lancets lancets Use as instructed 08/27/22   Simmons-Robinson, Riki Sheer, MD  acetaminophen (TYLENOL) 500 MG tablet Take 1,000 mg by mouth every 8 (eight) hours as needed for mild pain, moderate pain, fever or headache.    [provider]  atorvastatin (LIPITOR) 20 MG tablet Take 20 mg by mouth at bedtime.     [provider]  blood glucose meter kit and supplies Dispense based on patient and insurance preference. Use once daily as directed. (FOR ICD-10 E11.21). 12/05/19   Jerrol Banana., MD  Calcium-Vitamin D 600-200 MG-UNIT per tablet Take  600 tablets by mouth daily with supper.     [provider]  celecoxib (CELEBREX) 100 MG capsule TAKE 2 CAPSULES BY MOUTH DAILY AS NEEDED 12/12/21   Jerrol Banana., MD  digoxin (LANOXIN) 0.125 MG tablet Take 0.125 mg by mouth every other day.  04/19/19   [provider]  ELIQUIS 2.5 MG TABS tablet Take 2.5 mg by mouth 2 (two) times daily.  04/24/19   [provider]  ENTRESTO 97-103 MG Take 1 tablet by mouth 2 (two) times daily.  07/18/18   [provider]  furosemide (LASIX) 20 MG tablet Take 20 mg by mouth daily.     [provider]  gabapentin (NEURONTIN) 600 MG tablet TAKE 1 TABLET BY MOUTH 3 TIMES DAILY 03/31/22   Jerrol Banana., MD  glucose blood (ACCU-CHEK AVIVA PLUS) test strip Use as instructed 08/27/22   Simmons-Robinson, Riki Sheer, MD  imipramine (TOFRANIL) 25 MG tablet TAKE 1 TABLET BY MOUTH AT BEDTIME 05/12/22   Birdie Sons, MD  Liniments (BLUE-EMU SUPER STRENGTH) CREA Apply topically.    [provider]  loperamide (IMODIUM) 2 MG capsule Take 2 mg by mouth as needed.     [provider]  Methylcellulose, Laxative, 500 MG TABS Take 2 tablets by mouth daily.    [provider]  metoprolol succinate (TOPROL-XL) 100 MG 24 hr tablet TAKE ONE TABLET BY MOUTH EVERY DAY 01/20/21   Jerrol Banana., MD  Multiple Vitamins-Minerals (CENTRUM SILVER ULTRA WOMENS PO) Take 1 tablet by mouth daily with lunch.     [provider]  MYRBETRIQ 50 MG TB24 tablet TAKE 1 TABLET BY MOUTH DAILY 01/12/22   Jerrol Banana., MD  nitrofurantoin, macrocrystal-monohydrate, (MACROBID) 100 MG capsule Take 1 capsule (100 mg total) by mouth 2 (two) times daily. 02/18/22   Jerrol Banana., MD  nystatin [facility]) powder APPLY TOPICALLY FOUR TIMES A DAY 03/23/22   Jerrol Banana., MD  omeprazole (PRILOSEC) 20 MG capsule TAKE 1 CAPSULE BY MOUTH ONCE DAILY 11/17/21   Jerrol Banana., MD  Probiotic Product  (ALIGN) 4 MG CAPS Take by mouth every other day.    [provider]  sitaGLIPtin (JANUVIA) 100 MG tablet Take 1 tablet (100 mg total) by mouth every morning. 07/20/22   Simmons-Robinson, Riki Sheer, MD  spironolactone (ALDACTONE) 25 MG tablet TAKE 1 TABLET BY MOUTH DAILY 01/20/21   Jerrol Banana., MD  SYNTHROID 50 MCG tablet Take 1 tablet (50 mcg total) by mouth daily before breakfast. 08/27/22   [name], MD    Physical Exam: Vitals:   08/30/22 0300 08/30/22 0315 08/30/22 0330 08/30/22 0400  BP: (!) 143/82  (!) 174/71  136/78  Pulse: 87 85 (!) 102 84  Resp: '15 14 20 16  '$ Temp:      TempSrc:      SpO2: 94% 94% 96% 90%   Physical Exam Vitals and nursing note reviewed.  Constitutional:      General: She is not in acute distress. HENT:     Head: Normocephalic and atraumatic.  Cardiovascular:     Rate and Rhythm: Normal rate and regular rhythm.     Heart sounds: Normal heart sounds.  Pulmonary:     Effort: Pulmonary effort is normal.     Breath sounds: Normal breath sounds.  Abdominal:     Palpations: Abdomen is soft.     Tenderness: There is no abdominal tenderness.  Neurological:     Mental Status: She is alert. Mental status is at baseline.     Labs on Admission: I have personally reviewed following labs and imaging studies  CBC: Recent Labs  Lab 08/30/22 0238  WBC 9.7  NEUTROABS 7.7  HGB 11.5*  HCT 35.6*  MCV 98.6  PLT 903*   Basic Metabolic Panel: Recent Labs  Lab 08/30/22 0238  NA 138  K 4.2  CL 103  CO2 25  GLUCOSE 217*  BUN 24*  CREATININE 0.95  CALCIUM 9.0   GFR: CrCl cannot be calculated (Unknown ideal weight.). Liver Function Tests: No results for input(s): "AST", "ALT", "ALKPHOS", "BILITOT", "PROT", "ALBUMIN" in the last 168 hours. No results for input(s): "LIPASE", "AMYLASE" in the last 168 hours. No results for input(s): "AMMONIA" in the last 168 hours. Coagulation Profile: Recent Labs  Lab 08/30/22 0238  INR  1.2   Cardiac Enzymes: No results for input(s): "CKTOTAL", "CKMB", "CKMBINDEX", "TROPONINI" in the last 168 hours. BNP (last 3 results) No results for input(s): "PROBNP" in the last 8760 hours. HbA1C: Recent Labs    08/27/22 1120  HGBA1C 7.6*   CBG: No results for input(s): "GLUCAP" in the last 168 hours. Lipid Profile: No results for input(s): "CHOL", "HDL", "LDLCALC", "TRIG", "CHOLHDL", "LDLDIRECT" in the last 72 hours. Thyroid Function Tests: No results for input(s): "TSH", "T4TOTAL", "FREET4", "T3FREE", "THYROIDAB" in the last 72 hours. Anemia Panel: No results for input(s): "VITAMINB12", "FOLATE", "FERRITIN", "TIBC", "IRON", "RETICCTPCT" in the last 72 hours. Urine analysis:    Component Value Date/Time   COLORURINE YELLOW (A) 08/03/2016 1114   APPEARANCEUR CLEAR (A) 08/03/2016 1114   APPEARANCEUR Cloudy 03/12/2013 1847   LABSPEC 1.005 08/03/2016 1114   LABSPEC 1.012 03/12/2013 1847   PHURINE 6.0 08/03/2016 1114   GLUCOSEU 50 (A) 08/03/2016 1114   GLUCOSEU Negative 03/12/2013 1847   HGBUR NEGATIVE 08/03/2016 1114   BILIRUBINUR neg 02/18/2022 1510   BILIRUBINUR Negative 03/12/2013 1847   KETONESUR NEGATIVE 08/03/2016 1114   PROTEINUR Positive (A) 02/18/2022 1510   PROTEINUR NEGATIVE 08/03/2016 1114   UROBILINOGEN 0.2 02/18/2022 1510   NITRITE neg 02/18/2022 1510   NITRITE POSITIVE (A) 08/03/2016 1114   LEUKOCYTESUR Large (3+) (A) 02/18/2022 1510   LEUKOCYTESUR 3+ 03/12/2013 1847    Radiological Exams on Admission: CT FEMUR RIGHT WO CONTRAST  Result Date: 08/30/2022 CLINICAL DATA:  Right femoral fracture EXAM: CT OF THE LOWER RIGHT EXTREMITY WITHOUT CONTRAST TECHNIQUE: Multidetector CT imaging of the right lower extremity was performed according to the standard protocol. RADIATION DOSE REDUCTION: This exam was performed according to the departmental dose-optimization program which includes automated exposure control, adjustment of the mA and/or kV according to  patient size and/or use of iterative reconstruction technique. COMPARISON:  None Available. FINDINGS: Bones/Joint/Cartilage Right hip bipolar hemiarthroplasty has been performed. There is an oblique periprosthetic fracture of the proximal femoral diaphysis involving the distal femoral stem component. In this region, there is endosteal scalloping and thinning of the femoral diaphyseal cortex, particularly posteromedially, best appreciated on coronal image # 121/10 and axial image # 131/4 which may relate to motion, aggressive granulomatosis or infection. Fracture plane extends through the methylmethacrylate plug at the distal tip of the femoral stem component. Distal fracture fragment demonstrates 1/2 shaft with lateral displacement and mild medial angulation. No hip dislocation. Right total knee arthroplasty noted with arthroplasty components in expected alignment. Osseous structures are diffusely osteopenic. Ligaments Suboptimally assessed by CT. Muscles and Tendons Moderate diffuse fatty atrophy. Streak artifact limits evaluation of the psoas tendon. Gluteal, adductor and hamstring tendons appear intact. Quadriceps and patellar tendons appear intact. Soft tissues Hyperdense intramuscular hematoma surrounds the fracture plane within the vastus musculature. Advanced vascular calcifications noted within the lower extremity arterial outflow. Scattered calcifications are also noted within the right popliteal and peripheral femoral vein likely the sequela of remote thrombosis or inflammation. IMPRESSION: 1. Oblique periprosthetic fracture of the proximal femoral diaphysis involving the distal femoral stem component of the right hip bipolar hemiarthroplasty. In this region, there is endosteal scalloping and thinning of the femoral diaphyseal cortex, particularly posteromedially, which may relate to motion, aggressive granulomatosis or infection. 2. Hyperdense intramuscular hematoma surrounds the fracture plane within the  vastus musculature. 3. Right total knee arthroplasty with arthroplasty components in expected alignment. 4. Advanced vascular calcifications within the lower extremity arterial outflow. 5. Scattered calcifications within the right popliteal and peripheral femoral vein likely the sequela of remote thrombosis or inflammation. Electronically Signed   By: Fidela Salisbury M.D.   On: 08/30/2022 04:16   DG FEMUR, MIN 2 VIEWS RIGHT  Result Date: 08/30/2022 CLINICAL DATA:  Right femur fracture EXAM: RIGHT FEMUR 2 VIEWS COMPARISON:  None Available. FINDINGS: There is no fracture or dislocation of the distal right femur. Periprosthetic fracture of the proximal right femur is unchanged. The components of the right total knee arthroplasty are normally position. IMPRESSION: Periprosthetic fracture of the proximal right femur is unchanged. No fracture or dislocation of the distal right femur. Electronically Signed   By: Ulyses Jarred M.D.   On: 08/30/2022 03:40   DG Hip Unilat W or Wo Pelvis 2-3 Views Right  Result Date: 08/30/2022 CLINICAL DATA:  Fall EXAM: DG HIP (WITH OR WITHOUT PELVIS) 2-3V RIGHT COMPARISON:  None Available. FINDINGS: Right hip hemiarthroplasty. There is a periprosthetic fracture with medial angulation and oblique orientation at the distal aspect of the prosthetic femoral stem. No hip dislocation. IMPRESSION: Periprosthetic fracture with medial angulation and oblique orientation at the distal aspect of the prosthetic femoral stem. Electronically Signed   By: Ulyses Jarred M.D.   On: 08/30/2022 02:33   DG Chest Port 1 View  Result Date: 08/30/2022 CLINICAL DATA:  Preop chest radiograph. EXAM: PORTABLE CHEST 1 VIEW COMPARISON:  Chest radiograph dated 05/21/2015. FINDINGS: No focal consolidation, pleural effusion, or pneumothorax. Mild cardiomegaly. There is calcification of the mitral annulus. Atherosclerotic calcification of the aortic arch. Degenerative changes of the spine. No acute osseous  pathology. IMPRESSION: 1. No active disease. 2. Mild cardiomegaly. Electronically Signed   By: Anner Crete M.D.   On: 08/30/2022 02:32     Data Reviewed: Relevant notes from primary care and specialist visits, past discharge summaries as available in EHR, including Care Everywhere. Prior diagnostic testing as pertinent  to current admission diagnoses Updated medications and problem lists for reconciliation ED course, including vitals, labs, imaging, treatment and response to treatment Triage notes, nursing and pharmacy notes and ED provider's notes Notable results as noted in HPI   Assessment and Plan: * Periprosthetic fracture around internal prosthetic right hip joint ([place]) Patient to go to the OR likely on 1/22 to 1/23 after Eliquis washout Getting preoperative cardiac clearance due to history of systolic heart failure for with no recent echo on record  Atrial fibrillation ([place]) Chronic anticoagulation Hold Eliquis for anticipated surgery Continue metoprolol  Chronic systolic CHF (congestive heart failure) (HCC) Clinically euvolemic, last EF on record 36% in July 2020 Continue digoxin, furosemide, metoprolol, Entresto and spironolactone Will get cardiology consult for preoperative cardiac clearance  Uncontrolled type 2 diabetes mellitus with hyperglycemia, without long-term current use of insulin (HCC) Sliding scale insulin coverage  History of malignant gastrointestinal stromal tumor (GIST) No acute issues suspected  Stage 3a chronic kidney disease ([place]) Renal function at baseline  Adult hypothyroidism Continue levothyroxine  Essential (primary) hypertension BP controlled.  Continue home antihypertensives        DVT prophylaxis: SCD  Consults: Orthopedist Dr. Karel Jarvis  Advance Care Planning:   Code Status: Prior   Family Communication: Daughter Nilsa Nutting at bedside  Disposition Plan: Back to previous home environment  Severity of Illness: The  appropriate patient status for this patient is INPATIENT. Inpatient status is judged to be reasonable and necessary in order to provide the required intensity of service to ensure the patient's safety. The patient's presenting symptoms, physical exam findings, and initial radiographic and laboratory data in the context of their chronic comorbidities is felt to place them at high risk for further clinical deterioration. Furthermore, it is not anticipated that the patient will be medically stable for discharge from the hospital within 2 midnights of admission.   * I certify that at the point of admission it is my clinical judgment that the patient will require inpatient hospital care spanning beyond 2 midnights from the point of admission due to high intensity of service, high risk for further deterioration and high frequency of surveillance required.*  Author: Athena Masse, MD 08/30/2022 4:28 AM  For on call review [email or web address].

## 2022-08-30 NOTE — Assessment & Plan Note (Signed)
BP controlled - Continue home antihypertensives 

## 2022-08-30 NOTE — ED Notes (Signed)
Patient placed on 2 L [place] due to intermittent drops noted in oxygen saturations after pain medications.

## 2022-08-30 NOTE — Assessment & Plan Note (Addendum)
Sliding scale insulin coverage Mealtime Novolog, increase 6 >> 8 units TID WC

## 2022-08-30 NOTE — H&P (View-Only) (Signed)
ORTHOPAEDIC CONSULTATION  REQUESTING PHYSICIAN: Val Riles, MD  Chief Complaint:   Right hip periprosthetic femur fracture  History of Present Illness: Dawn Burnett is a 89 y.o. female  with medical history significant for Atrial fibrillation on Eliquis, HTN, history of gastrointestinal stromal tumor, CKD 3a, systolic CHF (EF 37% 0/4888), hypothyroidism and non-insulin-dependent type 2 diabetes who presented to the emergency room after an accidental slip and fall overnight when getting up to go to the bathroom.  She reports she immediately had right hip pain and was unable to ambulate.  The patient is extremely hard of hearing and the daughter is at bedside assisting with history.  Patient reports a history of hip fracture approximately 20 years ago for which she underwent a cemented hemiarthroplasty on the right side.  The patient has not had any complications with her hip since that time.  The patient denies any numbness or tingling in the extremity or any chest pain, shortness of breath at this time.  Patient denies any preexisting pain in the hip prior to the fall.  Past Medical History:  Diagnosis Date   Arthritis    Atrial fibrillation (HCC)    CHF (congestive heart failure) (HCC)    Diabetes mellitus without complication (HCC)    GERD (gastroesophageal reflux disease)    Hiatal hernia    Hypertension    Neuropathy    Skin cancer, basal cell    Thyroid disease    Past Surgical History:  Procedure Laterality Date   ABDOMINAL HYSTERECTOMY     APPENDECTOMY     BREAST SURGERY     cateract extraction     EYE SURGERY     done by Dr. Vickki Muff   eyelid     drooping right eyelid   KNEE SURGERY     REMOVAL OF GASTROINTESTINAL STOMATIC  TUMOR OF STOMACH  06/19/2009   right hip surgery     right total knee replacement     Social History   Socioeconomic History   Marital status: Widowed    Spouse name: widowed    Number of children: 2   Years of education: 12   Highest education level: 12th grade  Occupational History   Occupation: retired  Tobacco Use   Smoking status: Never   Smokeless tobacco: Never  Vaping Use   Vaping Use: Never used  Substance and Sexual Activity   Alcohol use: No   Drug use: No   Sexual activity: Never  Other Topics Concern   Not on file  Social History Narrative   Not on file   Social Determinants of Health   Financial Resource Strain: Low Risk  (01/24/2020)   Overall Financial Resource Strain (CARDIA)    Difficulty of Paying Living Expenses: Not hard at all  Food Insecurity: No Food Insecurity (08/30/2022)   Hunger Vital Sign    Worried About Running Out of Food in the Last Year: Never true    [place] in the Last Year: Never true  Transportation Needs: No Transportation Needs (08/30/2022)   PRAPARE - Hydrologist (Medical): No    Lack of Transportation (Non-Medical): No  Physical Activity: Inactive (01/24/2020)   Exercise Vital Sign    Days of Exercise per Week: 0 days    Minutes of Exercise per Session: 0 min  Stress: No Stress Concern Present (01/24/2020)   [place]    Feeling of Stress : Only a  little  Social Connections: Socially Isolated (01/24/2020)   Social Connection and Isolation Panel [NHANES]    Frequency of Communication with Friends and Family: More than three times a week    Frequency of Social Gatherings with Friends and Family: More than three times a week    Attends Religious Services: Never    Marine scientist or Organizations: No    Attends Archivist Meetings: Never    Marital Status: Widowed   Family History  Problem Relation Age of Onset   Heart disease Mother    Hypertension Mother    Heart attack Mother    Stomach cancer Father    Liver cancer Brother    Hypertension Son    Huntington's disease Sister     Diabetes Sister    Kidney disease Sister    Congestive Heart Failure Sister    Pneumonia Sister    Stroke Brother    Parkinson's disease Brother    Hypertension Brother    Congestive Heart Failure Brother    Allergies  Allergen Reactions   Iodinated Contrast Media Rash   Augmentin [Amoxicillin-Pot Clavulanate] Other (See Comments)    Gi distress    Codeine    Crestor [Rosuvastatin] Other (See Comments)    Unknown Unknown   Levaquin [Levofloxacin In D5w]     Stomach pain, body pain/ache all over Stomach pain, body pain/ache all over   Naproxen Other (See Comments)    unknown unknown   Prednisone    Sulfa Antibiotics    Sulfa Antibiotics Other (See Comments)   Sulfacetamide Sodium    Tolectin [Tolmetin]    Prior to Admission medications   Medication Sig Start Date End Date Taking? Authorizing Provider  acetaminophen (TYLENOL) 500 MG tablet Take 1,000 mg by mouth every 8 (eight) hours as needed for mild pain, moderate pain, fever or headache.   Yes [provider]  atorvastatin (LIPITOR) 20 MG tablet Take 20 mg by mouth at bedtime.    Yes [provider]  Calcium-Vitamin D 600-200 MG-UNIT per tablet Take 600 tablets by mouth daily with supper.    Yes [provider]  celecoxib (CELEBREX) 100 MG capsule TAKE 2 CAPSULES BY MOUTH DAILY AS NEEDED 12/12/21  Yes Jerrol Banana., MD  digoxin (LANOXIN) 0.125 MG tablet Take 0.125 mg by mouth every other day.  04/19/19  Yes [provider]  ELIQUIS 2.5 MG TABS tablet Take 2.5 mg by mouth 2 (two) times daily.  04/24/19  Yes [provider]  ENTRESTO 97-103 MG Take 1 tablet by mouth 2 (two) times daily.  07/18/18  Yes [provider]  furosemide (LASIX) 20 MG tablet Take 20 mg by mouth daily.    Yes [provider]  gabapentin (NEURONTIN) 600 MG tablet TAKE 1 TABLET BY MOUTH 3 TIMES DAILY 03/31/22  Yes Jerrol Banana., MD  imipramine (TOFRANIL) 25 MG tablet TAKE 1 TABLET  BY MOUTH AT BEDTIME 05/12/22  Yes Birdie Sons, MD  Liniments (BLUE-EMU SUPER STRENGTH) CREA Apply topically.   Yes [provider]  loperamide (IMODIUM) 2 MG capsule Take 2 mg by mouth as needed.    Yes [provider]  Methylcellulose, Laxative, 500 MG TABS Take 2 tablets by mouth daily.   Yes [provider]  metoprolol succinate (TOPROL-XL) 100 MG 24 hr tablet TAKE ONE TABLET BY MOUTH EVERY DAY 01/20/21  Yes Jerrol Banana., MD  Multiple Vitamins-Minerals (CENTRUM SILVER ULTRA WOMENS PO) Take  1 tablet by mouth daily with lunch.    Yes [provider]  MYRBETRIQ 50 MG TB24 tablet TAKE 1 TABLET BY MOUTH DAILY 01/12/22  Yes Jerrol Banana., MD  omeprazole (PRILOSEC) 20 MG capsule TAKE 1 CAPSULE BY MOUTH ONCE DAILY 11/17/21  Yes Jerrol Banana., MD  Probiotic Product (ALIGN) 4 MG CAPS Take by mouth every other day.   Yes [provider]  sitaGLIPtin (JANUVIA) 100 MG tablet Take 1 tablet (100 mg total) by mouth every morning. 07/20/22  Yes Simmons-Robinson, Makiera, MD  spironolactone (ALDACTONE) 25 MG tablet TAKE 1 TABLET BY MOUTH DAILY 01/20/21  Yes Jerrol Banana., MD  SYNTHROID 50 MCG tablet Take 1 tablet (50 mcg total) by mouth daily before breakfast. 08/27/22  Yes Simmons-Robinson, Makiera, MD  Accu-Chek Softclix Lancets lancets Use as instructed 08/27/22   Simmons-Robinson, Riki Sheer, MD  blood glucose meter kit and supplies Dispense based on patient and insurance preference. Use once daily as directed. (FOR ICD-10 E11.21). 12/05/19   Jerrol Banana., MD  glucose blood (ACCU-CHEK AVIVA PLUS) test strip Use as instructed 08/27/22   Simmons-Robinson, Riki Sheer, MD  nitrofurantoin, macrocrystal-monohydrate, (MACROBID) 100 MG capsule Take 1 capsule (100 mg total) by mouth 2 (two) times daily. Patient not taking: Reported on 08/30/2022 02/18/22   Jerrol Banana., MD  nystatin [facility]) powder APPLY TOPICALLY FOUR TIMES A  DAY Patient not taking: Reported on 08/30/2022 03/23/22   Jerrol Banana., MD   CT FEMUR RIGHT WO CONTRAST  Result Date: 08/30/2022 CLINICAL DATA:  Right femoral fracture EXAM: CT OF THE LOWER RIGHT EXTREMITY WITHOUT CONTRAST TECHNIQUE: Multidetector CT imaging of the right lower extremity was performed according to the standard protocol. RADIATION DOSE REDUCTION: This exam was performed according to the departmental dose-optimization program which includes automated exposure control, adjustment of the mA and/or kV according to patient size and/or use of iterative reconstruction technique. COMPARISON:  None Available. FINDINGS: Bones/Joint/Cartilage Right hip bipolar hemiarthroplasty has been performed. There is an oblique periprosthetic fracture of the proximal femoral diaphysis involving the distal femoral stem component. In this region, there is endosteal scalloping and thinning of the femoral diaphyseal cortex, particularly posteromedially, best appreciated on coronal image # 121/10 and axial image # 131/4 which may relate to motion, aggressive granulomatosis or infection. Fracture plane extends through the methylmethacrylate plug at the distal tip of the femoral stem component. Distal fracture fragment demonstrates 1/2 shaft with lateral displacement and mild medial angulation. No hip dislocation. Right total knee arthroplasty noted with arthroplasty components in expected alignment. Osseous structures are diffusely osteopenic. Ligaments Suboptimally assessed by CT. Muscles and Tendons Moderate diffuse fatty atrophy. Streak artifact limits evaluation of the psoas tendon. Gluteal, adductor and hamstring tendons appear intact. Quadriceps and patellar tendons appear intact. Soft tissues Hyperdense intramuscular hematoma surrounds the fracture plane within the vastus musculature. Advanced vascular calcifications noted within the lower extremity arterial outflow. Scattered calcifications are also noted  within the right popliteal and peripheral femoral vein likely the sequela of remote thrombosis or inflammation. IMPRESSION: 1. Oblique periprosthetic fracture of the proximal femoral diaphysis involving the distal femoral stem component of the right hip bipolar hemiarthroplasty. In this region, there is endosteal scalloping and thinning of the femoral diaphyseal cortex, particularly posteromedially, which may relate to motion, aggressive granulomatosis or infection. 2. Hyperdense intramuscular hematoma surrounds the fracture plane within the vastus musculature. 3. Right total knee arthroplasty with arthroplasty components in expected alignment. 4. Advanced vascular calcifications  within the lower extremity arterial outflow. 5. Scattered calcifications within the right popliteal and peripheral femoral vein likely the sequela of remote thrombosis or inflammation. Electronically Signed   By: Fidela Salisbury M.D.   On: 08/30/2022 04:16   DG FEMUR, MIN 2 VIEWS RIGHT  Result Date: 08/30/2022 CLINICAL DATA:  Right femur fracture EXAM: RIGHT FEMUR 2 VIEWS COMPARISON:  None Available. FINDINGS: There is no fracture or dislocation of the distal right femur. Periprosthetic fracture of the proximal right femur is unchanged. The components of the right total knee arthroplasty are normally position. IMPRESSION: Periprosthetic fracture of the proximal right femur is unchanged. No fracture or dislocation of the distal right femur. Electronically Signed   By: Ulyses Jarred M.D.   On: 08/30/2022 03:40   DG Hip Unilat W or Wo Pelvis 2-3 Views Right  Result Date: 08/30/2022 CLINICAL DATA:  Fall EXAM: DG HIP (WITH OR WITHOUT PELVIS) 2-3V RIGHT COMPARISON:  None Available. FINDINGS: Right hip hemiarthroplasty. There is a periprosthetic fracture with medial angulation and oblique orientation at the distal aspect of the prosthetic femoral stem. No hip dislocation. IMPRESSION: Periprosthetic fracture with medial angulation and  oblique orientation at the distal aspect of the prosthetic femoral stem. Electronically Signed   By: Ulyses Jarred M.D.   On: 08/30/2022 02:33   DG Chest Port 1 View  Result Date: 08/30/2022 CLINICAL DATA:  Preop chest radiograph. EXAM: PORTABLE CHEST 1 VIEW COMPARISON:  Chest radiograph dated 05/21/2015. FINDINGS: No focal consolidation, pleural effusion, or pneumothorax. Mild cardiomegaly. There is calcification of the mitral annulus. Atherosclerotic calcification of the aortic arch. Degenerative changes of the spine. No acute osseous pathology. IMPRESSION: 1. No active disease. 2. Mild cardiomegaly. Electronically Signed   By: Anner Crete M.D.   On: 08/30/2022 02:32    Positive ROS: All other systems have been reviewed and were otherwise negative with the exception of those mentioned in the HPI and as above.  Physical Exam: General:  Alert, no acute distress Psychiatric:  Patient is competent for consent with normal mood and affect but very hard of hearing Cardiovascular:  No pedal edema Respiratory:  No wheezing, non-labored breathing GI:  Abdomen is soft and non-tender Skin:  No lesions in the area of chief complaint Neurologic:  Sensation intact distally Lymphatic:  No axillary or cervical lymphadenopathy  Orthopedic Exam:  Right lower extremity Short and externally rotated Skin intact Tender to palpation over the lateral thigh with thigh swelling, compartments soft Able to dorsiflex foot/ankle and toes Sensation intact over foot and toes  +DP pulse  Secondary survey No tenderness to palpation over other bony prominences in the lower extremities or bilateral upper extremities No pain with logroll or simulated axial loading of the left lower extremity, patient does not chronic left knee pain from arthritis which is still present at this time on exam, but unchanged All compartments soft No tenderness to palpation over the cervical or thoracic spine, no bony step-off Motor  grossly intact throughout, no focal deficits Sensation grossly intact throughout, no focal deficits Good distal pulses and capillary refill on all extremities   Imaging:  Xrays and CT scans of the right hip and femur, images and reports reviewed by myself.  There is a displaced periprosthetic proximal femur fracture with a spiral fracture pattern originating in the distal third around a cemented hemiarthroplasty.  There is some evidence of bone loss distal to the tip of the stem at the site of the fracture which is consistent with some  stress shielding.  The component appears well-fixed to the proximal fragment on multiple images and scans.  No other periprosthetic fractures or dislocations noted.  Assessment: Right hip periprosthetic proximal femur fracture around a cemented hemiarthroplasty  Plan: Dawn Burnett is a 79 -year-old female who presents with a right hip periprosthetic proximal femur fracture.  A long discussion took place with the patient and her daughter describing the treatment options including surgical and nonoperative treatment options.  We discussed that the surgery for the this fracture would be either an open reduction internal fixation or possibly an open reduction internal fixation with a revision of her hemiarthroplasty.  I explained that the decision whether or not to proceed with a revision of the hemiarthroplasty would depend on intraoperative stability of the component on manual testing during the surgery.  We discussed the open reduction internal fixation and revision and what those procedures would entail. The xrays were reviewed with the patient and the implants were discussed. The ability to secure the implant utilizing screws/cement/ or cementless (press fit) fixation was discussed. Surgical exposures were discussed with the patient.    The hospitalization and post-operative care and rehabilitation were also discussed. The use of perioperative antibiotics and DVT prophylaxis  were discussed. The risk, benefits and alternatives to a surgical intervention were discussed at length with the patient. The patient was also advised of risks related to the medical comorbidities. A lengthy discussion took place to review the most common complications including but not limited to: deep vein thrombosis, pulmonary embolus, heart attack, stroke, infection, wound breakdown, dislocation, numbness, leg length in-equality, damage to nerves, intraoperative fracture, malunion/ nonunion, tendon,muscles, arteries or other blood vessels, death and other possible complications from anesthesia. The patient was told that we will take steps to minimize these risks by using sterile technique, antibiotics and DVT prophylaxis when appropriate and follow the patient postoperatively in the office setting to monitor progress. The possibility of recurrent pain, no improvement in pain and actual worsening of pain were also discussed with the patient.  The risk of dislocation following surgery was discussed and potential precautions to prevent dislocation were reviewed.      The benefits of surgery were discussed with the patient including the potential for improving the patient's current clinical condition through operative intervention. Alternatives to surgical intervention including conservative management were also discussed in detail. All questions were answered to the satisfaction of the patient. The patient participated and agreed to the plan of care as well as the use of the recommended implants for their surgery.   Given the extent of the surgical exposure and possible need for revision of the hip components we discussed the benefits of giving some more time for her Eliquis to metabolize and excrete prior to proceeding with surgery.  NWB RLE, Bedrest Plan for surgery tomorrow 08/31/2022 N.p.o. after midnight for the operating room Hold anticoagulation after midnight  Steffanie Rainwater MD     Steffanie Rainwater MD  Beeper #:  [phone]  08/30/2022 11:56 AM

## 2022-08-30 NOTE — Assessment & Plan Note (Signed)
Patient to go to the OR likely on 1/22 to 1/23 after Eliquis washout Getting preoperative cardiac clearance due to history of systolic heart failure for with no recent echo on record

## 2022-08-30 NOTE — Assessment & Plan Note (Signed)
Clinically euvolemic, last EF on record 36% in July 2020 Continue digoxin, furosemide, metoprolol, Entresto and spironolactone Will get cardiology consult for preoperative cardiac clearance

## 2022-08-30 NOTE — ED Notes (Signed)
Patient taken to radiology 

## 2022-08-30 NOTE — Consult Note (Signed)
ORTHOPAEDIC CONSULTATION  REQUESTING PHYSICIAN: Val Riles, MD  Chief Complaint:   Right hip periprosthetic femur fracture  History of Present Illness: Dawn Burnett is a 89 y.o. female  with medical history significant for Atrial fibrillation on Eliquis, HTN, history of gastrointestinal stromal tumor, CKD 3a, systolic CHF (EF 66% 11/4032), hypothyroidism and non-insulin-dependent type 2 diabetes who presented to the emergency room after an accidental slip and fall overnight when getting up to go to the bathroom.  She reports she immediately had right hip pain and was unable to ambulate.  The patient is extremely hard of hearing and the daughter is at bedside assisting with history.  Patient reports a history of hip fracture approximately 20 years ago for which she underwent a cemented hemiarthroplasty on the right side.  The patient has not had any complications with her hip since that time.  The patient denies any numbness or tingling in the extremity or any chest pain, shortness of breath at this time.  Patient denies any preexisting pain in the hip prior to the fall.  Past Medical History:  Diagnosis Date   Arthritis    Atrial fibrillation (HCC)    CHF (congestive heart failure) (HCC)    Diabetes mellitus without complication (HCC)    GERD (gastroesophageal reflux disease)    Hiatal hernia    Hypertension    Neuropathy    Skin cancer, basal cell    Thyroid disease    Past Surgical History:  Procedure Laterality Date   ABDOMINAL HYSTERECTOMY     APPENDECTOMY     BREAST SURGERY     cateract extraction     EYE SURGERY     done by Dr. Vickki Muff   eyelid     drooping right eyelid   KNEE SURGERY     REMOVAL OF GASTROINTESTINAL STOMATIC  TUMOR OF STOMACH  06/19/2009   right hip surgery     right total knee replacement     Social History   Socioeconomic History   Marital status: Widowed    Spouse name: widowed    Number of children: 2   Years of education: 12   Highest education level: 12th grade  Occupational History   Occupation: retired  Tobacco Use   Smoking status: Never   Smokeless tobacco: Never  Vaping Use   Vaping Use: Never used  Substance and Sexual Activity   Alcohol use: No   Drug use: No   Sexual activity: Never  Other Topics Concern   Not on file  Social History Narrative   Not on file   Social Determinants of Health   Financial Resource Strain: Low Risk  (01/24/2020)   Overall Financial Resource Strain (CARDIA)    Difficulty of Paying Living Expenses: Not hard at all  Food Insecurity: No Food Insecurity (08/30/2022)   Hunger Vital Sign    Worried About Running Out of Food in the Last Year: Never true    [place] in the Last Year: Never true  Transportation Needs: No Transportation Needs (08/30/2022)   PRAPARE - Hydrologist (Medical): No    Lack of Transportation (Non-Medical): No  Physical Activity: Inactive (01/24/2020)   Exercise Vital Sign    Days of Exercise per Week: 0 days    Minutes of Exercise per Session: 0 min  Stress: No Stress Concern Present (01/24/2020)   [place]    Feeling of Stress : Only a  little  Social Connections: Socially Isolated (01/24/2020)   Social Connection and Isolation Panel [NHANES]    Frequency of Communication with Friends and Family: More than three times a week    Frequency of Social Gatherings with Friends and Family: More than three times a week    Attends Religious Services: Never    Marine scientist or Organizations: No    Attends Archivist Meetings: Never    Marital Status: Widowed   Family History  Problem Relation Age of Onset   Heart disease Mother    Hypertension Mother    Heart attack Mother    Stomach cancer Father    Liver cancer Brother    Hypertension Son    Huntington's disease Sister     Diabetes Sister    Kidney disease Sister    Congestive Heart Failure Sister    Pneumonia Sister    Stroke Brother    Parkinson's disease Brother    Hypertension Brother    Congestive Heart Failure Brother    Allergies  Allergen Reactions   Iodinated Contrast Media Rash   Augmentin [Amoxicillin-Pot Clavulanate] Other (See Comments)    Gi distress    Codeine    Crestor [Rosuvastatin] Other (See Comments)    Unknown Unknown   Levaquin [Levofloxacin In D5w]     Stomach pain, body pain/ache all over Stomach pain, body pain/ache all over   Naproxen Other (See Comments)    unknown unknown   Prednisone    Sulfa Antibiotics    Sulfa Antibiotics Other (See Comments)   Sulfacetamide Sodium    Tolectin [Tolmetin]    Prior to Admission medications   Medication Sig Start Date End Date Taking? Authorizing Provider  acetaminophen (TYLENOL) 500 MG tablet Take 1,000 mg by mouth every 8 (eight) hours as needed for mild pain, moderate pain, fever or headache.   Yes [provider]  atorvastatin (LIPITOR) 20 MG tablet Take 20 mg by mouth at bedtime.    Yes [provider]  Calcium-Vitamin D 600-200 MG-UNIT per tablet Take 600 tablets by mouth daily with supper.    Yes [provider]  celecoxib (CELEBREX) 100 MG capsule TAKE 2 CAPSULES BY MOUTH DAILY AS NEEDED 12/12/21  Yes Jerrol Banana., MD  digoxin (LANOXIN) 0.125 MG tablet Take 0.125 mg by mouth every other day.  04/19/19  Yes [provider]  ELIQUIS 2.5 MG TABS tablet Take 2.5 mg by mouth 2 (two) times daily.  04/24/19  Yes [provider]  ENTRESTO 97-103 MG Take 1 tablet by mouth 2 (two) times daily.  07/18/18  Yes [provider]  furosemide (LASIX) 20 MG tablet Take 20 mg by mouth daily.    Yes [provider]  gabapentin (NEURONTIN) 600 MG tablet TAKE 1 TABLET BY MOUTH 3 TIMES DAILY 03/31/22  Yes Jerrol Banana., MD  imipramine (TOFRANIL) 25 MG tablet TAKE 1 TABLET  BY MOUTH AT BEDTIME 05/12/22  Yes Birdie Sons, MD  Liniments (BLUE-EMU SUPER STRENGTH) CREA Apply topically.   Yes [provider]  loperamide (IMODIUM) 2 MG capsule Take 2 mg by mouth as needed.    Yes [provider]  Methylcellulose, Laxative, 500 MG TABS Take 2 tablets by mouth daily.   Yes [provider]  metoprolol succinate (TOPROL-XL) 100 MG 24 hr tablet TAKE ONE TABLET BY MOUTH EVERY DAY 01/20/21  Yes Jerrol Banana., MD  Multiple Vitamins-Minerals (CENTRUM SILVER ULTRA WOMENS PO) Take  1 tablet by mouth daily with lunch.    Yes [provider]  MYRBETRIQ 50 MG TB24 tablet TAKE 1 TABLET BY MOUTH DAILY 01/12/22  Yes Jerrol Banana., MD  omeprazole (PRILOSEC) 20 MG capsule TAKE 1 CAPSULE BY MOUTH ONCE DAILY 11/17/21  Yes Jerrol Banana., MD  Probiotic Product (ALIGN) 4 MG CAPS Take by mouth every other day.   Yes [provider]  sitaGLIPtin (JANUVIA) 100 MG tablet Take 1 tablet (100 mg total) by mouth every morning. 07/20/22  Yes Simmons-Robinson, Makiera, MD  spironolactone (ALDACTONE) 25 MG tablet TAKE 1 TABLET BY MOUTH DAILY 01/20/21  Yes Jerrol Banana., MD  SYNTHROID 50 MCG tablet Take 1 tablet (50 mcg total) by mouth daily before breakfast. 08/27/22  Yes Simmons-Robinson, Makiera, MD  Accu-Chek Softclix Lancets lancets Use as instructed 08/27/22   Simmons-Robinson, Riki Sheer, MD  blood glucose meter kit and supplies Dispense based on patient and insurance preference. Use once daily as directed. (FOR ICD-10 E11.21). 12/05/19   Jerrol Banana., MD  glucose blood (ACCU-CHEK AVIVA PLUS) test strip Use as instructed 08/27/22   Simmons-Robinson, Riki Sheer, MD  nitrofurantoin, macrocrystal-monohydrate, (MACROBID) 100 MG capsule Take 1 capsule (100 mg total) by mouth 2 (two) times daily. Patient not taking: Reported on 08/30/2022 02/18/22   Jerrol Banana., MD  nystatin [facility]) powder APPLY TOPICALLY FOUR TIMES A  DAY Patient not taking: Reported on 08/30/2022 03/23/22   Jerrol Banana., MD   CT FEMUR RIGHT WO CONTRAST  Result Date: 08/30/2022 CLINICAL DATA:  Right femoral fracture EXAM: CT OF THE LOWER RIGHT EXTREMITY WITHOUT CONTRAST TECHNIQUE: Multidetector CT imaging of the right lower extremity was performed according to the standard protocol. RADIATION DOSE REDUCTION: This exam was performed according to the departmental dose-optimization program which includes automated exposure control, adjustment of the mA and/or kV according to patient size and/or use of iterative reconstruction technique. COMPARISON:  None Available. FINDINGS: Bones/Joint/Cartilage Right hip bipolar hemiarthroplasty has been performed. There is an oblique periprosthetic fracture of the proximal femoral diaphysis involving the distal femoral stem component. In this region, there is endosteal scalloping and thinning of the femoral diaphyseal cortex, particularly posteromedially, best appreciated on coronal image # 121/10 and axial image # 131/4 which may relate to motion, aggressive granulomatosis or infection. Fracture plane extends through the methylmethacrylate plug at the distal tip of the femoral stem component. Distal fracture fragment demonstrates 1/2 shaft with lateral displacement and mild medial angulation. No hip dislocation. Right total knee arthroplasty noted with arthroplasty components in expected alignment. Osseous structures are diffusely osteopenic. Ligaments Suboptimally assessed by CT. Muscles and Tendons Moderate diffuse fatty atrophy. Streak artifact limits evaluation of the psoas tendon. Gluteal, adductor and hamstring tendons appear intact. Quadriceps and patellar tendons appear intact. Soft tissues Hyperdense intramuscular hematoma surrounds the fracture plane within the vastus musculature. Advanced vascular calcifications noted within the lower extremity arterial outflow. Scattered calcifications are also noted  within the right popliteal and peripheral femoral vein likely the sequela of remote thrombosis or inflammation. IMPRESSION: 1. Oblique periprosthetic fracture of the proximal femoral diaphysis involving the distal femoral stem component of the right hip bipolar hemiarthroplasty. In this region, there is endosteal scalloping and thinning of the femoral diaphyseal cortex, particularly posteromedially, which may relate to motion, aggressive granulomatosis or infection. 2. Hyperdense intramuscular hematoma surrounds the fracture plane within the vastus musculature. 3. Right total knee arthroplasty with arthroplasty components in expected alignment. 4. Advanced vascular calcifications  within the lower extremity arterial outflow. 5. Scattered calcifications within the right popliteal and peripheral femoral vein likely the sequela of remote thrombosis or inflammation. Electronically Signed   By: Fidela Salisbury M.D.   On: 08/30/2022 04:16   DG FEMUR, MIN 2 VIEWS RIGHT  Result Date: 08/30/2022 CLINICAL DATA:  Right femur fracture EXAM: RIGHT FEMUR 2 VIEWS COMPARISON:  None Available. FINDINGS: There is no fracture or dislocation of the distal right femur. Periprosthetic fracture of the proximal right femur is unchanged. The components of the right total knee arthroplasty are normally position. IMPRESSION: Periprosthetic fracture of the proximal right femur is unchanged. No fracture or dislocation of the distal right femur. Electronically Signed   By: Ulyses Jarred M.D.   On: 08/30/2022 03:40   DG Hip Unilat W or Wo Pelvis 2-3 Views Right  Result Date: 08/30/2022 CLINICAL DATA:  Fall EXAM: DG HIP (WITH OR WITHOUT PELVIS) 2-3V RIGHT COMPARISON:  None Available. FINDINGS: Right hip hemiarthroplasty. There is a periprosthetic fracture with medial angulation and oblique orientation at the distal aspect of the prosthetic femoral stem. No hip dislocation. IMPRESSION: Periprosthetic fracture with medial angulation and  oblique orientation at the distal aspect of the prosthetic femoral stem. Electronically Signed   By: Ulyses Jarred M.D.   On: 08/30/2022 02:33   DG Chest Port 1 View  Result Date: 08/30/2022 CLINICAL DATA:  Preop chest radiograph. EXAM: PORTABLE CHEST 1 VIEW COMPARISON:  Chest radiograph dated 05/21/2015. FINDINGS: No focal consolidation, pleural effusion, or pneumothorax. Mild cardiomegaly. There is calcification of the mitral annulus. Atherosclerotic calcification of the aortic arch. Degenerative changes of the spine. No acute osseous pathology. IMPRESSION: 1. No active disease. 2. Mild cardiomegaly. Electronically Signed   By: Anner Crete M.D.   On: 08/30/2022 02:32    Positive ROS: All other systems have been reviewed and were otherwise negative with the exception of those mentioned in the HPI and as above.  Physical Exam: General:  Alert, no acute distress Psychiatric:  Patient is competent for consent with normal mood and affect but very hard of hearing Cardiovascular:  No pedal edema Respiratory:  No wheezing, non-labored breathing GI:  Abdomen is soft and non-tender Skin:  No lesions in the area of chief complaint Neurologic:  Sensation intact distally Lymphatic:  No axillary or cervical lymphadenopathy  Orthopedic Exam:  Right lower extremity Short and externally rotated Skin intact Tender to palpation over the lateral thigh with thigh swelling, compartments soft Able to dorsiflex foot/ankle and toes Sensation intact over foot and toes  +DP pulse  Secondary survey No tenderness to palpation over other bony prominences in the lower extremities or bilateral upper extremities No pain with logroll or simulated axial loading of the left lower extremity, patient does not chronic left knee pain from arthritis which is still present at this time on exam, but unchanged All compartments soft No tenderness to palpation over the cervical or thoracic spine, no bony step-off Motor  grossly intact throughout, no focal deficits Sensation grossly intact throughout, no focal deficits Good distal pulses and capillary refill on all extremities   Imaging:  Xrays and CT scans of the right hip and femur, images and reports reviewed by myself.  There is a displaced periprosthetic proximal femur fracture with a spiral fracture pattern originating in the distal third around a cemented hemiarthroplasty.  There is some evidence of bone loss distal to the tip of the stem at the site of the fracture which is consistent with some  stress shielding.  The component appears well-fixed to the proximal fragment on multiple images and scans.  No other periprosthetic fractures or dislocations noted.  Assessment: Right hip periprosthetic proximal femur fracture around a cemented hemiarthroplasty  Plan: Dawn Burnett is a 75 -year-old female who presents with a right hip periprosthetic proximal femur fracture.  A long discussion took place with the patient and her daughter describing the treatment options including surgical and nonoperative treatment options.  We discussed that the surgery for the this fracture would be either an open reduction internal fixation or possibly an open reduction internal fixation with a revision of her hemiarthroplasty.  I explained that the decision whether or not to proceed with a revision of the hemiarthroplasty would depend on intraoperative stability of the component on manual testing during the surgery.  We discussed the open reduction internal fixation and revision and what those procedures would entail. The xrays were reviewed with the patient and the implants were discussed. The ability to secure the implant utilizing screws/cement/ or cementless (press fit) fixation was discussed. Surgical exposures were discussed with the patient.    The hospitalization and post-operative care and rehabilitation were also discussed. The use of perioperative antibiotics and DVT prophylaxis  were discussed. The risk, benefits and alternatives to a surgical intervention were discussed at length with the patient. The patient was also advised of risks related to the medical comorbidities. A lengthy discussion took place to review the most common complications including but not limited to: deep vein thrombosis, pulmonary embolus, heart attack, stroke, infection, wound breakdown, dislocation, numbness, leg length in-equality, damage to nerves, intraoperative fracture, malunion/ nonunion, tendon,muscles, arteries or other blood vessels, death and other possible complications from anesthesia. The patient was told that we will take steps to minimize these risks by using sterile technique, antibiotics and DVT prophylaxis when appropriate and follow the patient postoperatively in the office setting to monitor progress. The possibility of recurrent pain, no improvement in pain and actual worsening of pain were also discussed with the patient.  The risk of dislocation following surgery was discussed and potential precautions to prevent dislocation were reviewed.      The benefits of surgery were discussed with the patient including the potential for improving the patient's current clinical condition through operative intervention. Alternatives to surgical intervention including conservative management were also discussed in detail. All questions were answered to the satisfaction of the patient. The patient participated and agreed to the plan of care as well as the use of the recommended implants for their surgery.   Given the extent of the surgical exposure and possible need for revision of the hip components we discussed the benefits of giving some more time for her Eliquis to metabolize and excrete prior to proceeding with surgery.  NWB RLE, Bedrest Plan for surgery tomorrow 08/31/2022 N.p.o. after midnight for the operating room Hold anticoagulation after midnight  Steffanie Rainwater MD     Steffanie Rainwater MD  Beeper #:  [phone]  08/30/2022 11:56 AM

## 2022-08-30 NOTE — Evaluation (Signed)
Clinical/Bedside Swallow Evaluation Patient Details  Name: IVAN MASKELL MRN: [id number] Date of Birth: January 13, 1929  Today's Date: 08/30/2022 Time: SLP Start Time (ACUTE ONLY): 53 SLP Stop Time (ACUTE ONLY): 1150 SLP Time Calculation (min) (ACUTE ONLY): 15 min  Past Medical History:  Past Medical History:  Diagnosis Date   Arthritis    Atrial fibrillation (HCC)    CHF (congestive heart failure) (HCC)    Diabetes mellitus without complication (HCC)    GERD (gastroesophageal reflux disease)    Hiatal hernia    Hypertension    Neuropathy    Skin cancer, basal cell    Thyroid disease    Past Surgical History:  Past Surgical History:  Procedure Laterality Date   ABDOMINAL HYSTERECTOMY     APPENDECTOMY     BREAST SURGERY     cateract extraction     EYE SURGERY     done by Dr. Vickki Muff   eyelid     drooping right eyelid   KNEE SURGERY     REMOVAL OF GASTROINTESTINAL STOMATIC  TUMOR OF STOMACH  06/19/2009   right hip surgery     right total knee replacement     HPI:  Per H&P "VERCIE POKORNY is a 89 y.o. female with medical history significant for Atrial fibrillation on Eliquis, HTN, history of gastrointestinal stromal tumor, CKD 3a, systolic CHF (EF 40% 04/8118), hypothyroidism and non-insulin-dependent type 2 diabetes who presents to the ED following an accidental fall after she slipped when she got up to go to the bathroom, landing on her right hip.  She denies hitting anywhere else and experiences pain only in her right hip.  History is provided by daughter at bedside as patient is very hard of hearing.  ED course and data review: BP 176/77 with otherwise normal vitals.  Labs notable for glucose 217, hemoglobin 11.5EKG, personally viewed and interpreted showing A-fib at 92 with LBBB.  X-ray showing periprosthetic right proximal femur fracture.  Chest x-ray was clear.  The ED provider spoke with orthopedist, Dr. Karel Jarvis who will take patient to the OR following a 24-hour Eliquis washout.   Hospitalist consulted for admission."    Assessment / Plan / Recommendation  Clinical Impression  Pt seen for clinical swallowing evaluation. Pt alert and cooperative. HOH. Some confusion noted. Son and daughter-in-law present. RN at bedside. Son noted pt with difficulty swallowing dry/crumbly solids for "years."  Per RN, pt with difficulty swallowing pancake this AM. No hx of dysphagia noted. Pt with hx of GERD, hiatal hernia, and gastrointestinal stromal tumor.   Pt given trials of solid, pureed, and thin liquids. Pt demonstrated an intact oral swallow. Pharyngeal swallow appeared [facility] per clinical assessment. To palpation, pt with seemingly timely swallow initiation and seemingly adequate laryngeal elevation. No overt s/sx pharyngeal dysphagia. Clear, strong vocal quality across trials. Pt's complaints were not reproduced on today's evaluation; however, situational solid food dysphagia may be esophageal in nature.  Recommend continuation of a regular diet with well cut meats and extra gravies, sauces, and condiments to moisten food. Safe swallowing strategies/aspiration precautions and reflux precautions as outlined below.   SLP to f/u x1 for diet tolerance.   Pt and family made aware of results, recommendations, and SLP POC. ?full understanding by pt. Family verbalized understanding/agreement.   SLP Visit Diagnosis: Dysphagia, pharyngoesophageal phase (R13.14)    Aspiration Risk  Mild aspiration risk    Diet Recommendation Regular;Thin liquid (chopped meats; extra gravies, sauces, condiments)   Medication Administration:  (with pureed)  Compensations: Slow rate;Small sips/bites;Follow solids with liquid (reflux precautions) Postural Changes: Seated upright at 90 degrees;Remain upright for at least 30 minutes after po intake    Other  Recommendations Oral Care Recommendations: Oral care QID;Staff/trained caregiver to provide oral care    Recommendations for follow up therapy are one  component of a multi-disciplinary discharge planning process, led by the attending physician.  Recommendations may be updated based on patient status, additional functional criteria and insurance authorization.  Follow up Recommendations  (TBD)         Functional Status Assessment Patient has had a recent decline in their functional status and demonstrates the ability to make significant improvements in function in a reasonable and predictable amount of time.  Frequency and Duration min 2x/week  1 week       Prognosis Prognosis for Safe Diet Advancement: Fair      Swallow Study   General Date of Onset: 08/30/22 HPI: Per H&P "SHIRLINE KENDLE is a 89 y.o. female with medical history significant for Atrial fibrillation on Eliquis, HTN, history of gastrointestinal stromal tumor, CKD 3a, systolic CHF (EF 62% 04/5283), hypothyroidism and non-insulin-dependent type 2 diabetes who presents to the ED following an accidental fall after she slipped when she got up to go to the bathroom, landing on her right hip.  She denies hitting anywhere else and experiences pain only in her right hip.  History is provided by daughter at bedside as patient is very hard of hearing.  ED course and data review: BP 176/77 with otherwise normal vitals.  Labs notable for glucose 217, hemoglobin 11.5EKG, personally viewed and interpreted showing A-fib at 92 with LBBB.  X-ray showing periprosthetic right proximal femur fracture.  Chest x-ray was clear.  The ED provider spoke with orthopedist, Dr. Karel Jarvis who will take patient to the OR following a 24-hour Eliquis washout.  Hospitalist consulted for admission." Type of Study: Bedside Swallow Evaluation Previous Swallow Assessment: unknown Diet Prior to this Study: Regular;Thin liquids Temperature Spikes Noted: No Respiratory Status: Room air History of Recent Intubation: No Behavior/Cognition: Alert;Cooperative;Pleasant mood;Confused;Requires cueing Oral Cavity Assessment:  Within Functional Limits Oral Care Completed by SLP: Recent completion by staff Oral Cavity - Dentition: Adequate natural dentition Vision: Functional for self-feeding Self-Feeding Abilities: Able to feed self;Needs set up Patient Positioning: Upright in bed Baseline Vocal Quality: Normal Volitional Cough: Strong Volitional Swallow: Able to elicit    Oral/Motor/Sensory Function Overall Oral Motor/Sensory Function: Within functional limits   Ice Chips Ice chips: Not tested   Thin Liquid Thin Liquid: Within functional limits Presentation: Straw Other Comments: ~4 oz    Nectar Thick Nectar Thick Liquid: Not tested   Honey Thick Honey Thick Liquid: Not tested   Puree Puree: Within functional limits Presentation: Self Fed;Spoon Other Comments: ~3 oz   Solid     Solid: Within functional limits Presentation: Self Fed Other Comments: x1 graham cracker     Cherrie Gauze, M.S., [facility] [phone] (ASCOM)  Clearnce Sorrel Aicha Clingenpeel 08/30/2022,1:10 PM

## 2022-08-30 NOTE — ED Triage Notes (Signed)
Patient BIB EMS from home for evaluation of R hip pain.  Pt reports she was attempting to get out of bed to use bedside commode.  Had mechanical fall.  No reports of LOC.  Currently takes Eliquis.  No reports of hitting head.  C/o R hip pain.  Rotation noted

## 2022-08-31 ENCOUNTER — Inpatient Hospital Stay: Payer: Medicare PPO | Admitting: Anesthesiology

## 2022-08-31 ENCOUNTER — Inpatient Hospital Stay (HOSPITAL_COMMUNITY)
Admit: 2022-08-31 | Discharge: 2022-08-31 | Disposition: A | Discharge status: Home / Self Care | Payer: Medicare PPO | Attending: Student | Admitting: Student

## 2022-08-31 ENCOUNTER — Encounter: Payer: Self-pay | Admitting: Internal Medicine

## 2022-08-31 ENCOUNTER — Encounter
Admission: EM | Disposition: A | Discharge status: Skilled Nursing Facility | Payer: Self-pay | Source: Home / Self Care | Attending: Student

## 2022-08-31 ENCOUNTER — Inpatient Hospital Stay: Payer: Medicare PPO

## 2022-08-31 ENCOUNTER — Other Ambulatory Visit: Payer: Self-pay

## 2022-08-31 DIAGNOSIS — I5021 Acute systolic (congestive) heart failure: Secondary | ICD-10-CM

## 2022-08-31 DIAGNOSIS — M9701XA Periprosthetic fracture around internal prosthetic right hip joint, initial encounter: Secondary | ICD-10-CM | POA: Diagnosis not present

## 2022-08-31 HISTORY — PX: ORIF PERIPROSTHETIC FRACTURE: SHX5034

## 2022-08-31 LAB — CBC
HCT: 32.2 % — ABNORMAL LOW (ref 36.0–46.0)
Hemoglobin: 10.7 g/dL — ABNORMAL LOW (ref 12.0–15.0)
MCH: 31.8 pg (ref 26.0–34.0)
MCHC: 33.2 g/dL (ref 30.0–36.0)
MCV: 95.8 fL (ref 80.0–100.0)
Platelets: 145 10*3/uL — ABNORMAL LOW (ref 150–400)
RBC: 3.36 MIL/uL — ABNORMAL LOW (ref 3.87–5.11)
RDW: 12.9 % (ref 11.5–15.5)
WBC: 9.6 10*3/uL (ref 4.0–10.5)
nRBC: 0 % (ref 0.0–0.2)

## 2022-08-31 LAB — BASIC METABOLIC PANEL
Anion gap: 9 (ref 5–15)
BUN: 24 mg/dL — ABNORMAL HIGH (ref 8–23)
CO2: 29 mmol/L (ref 22–32)
Calcium: 9.1 mg/dL (ref 8.9–10.3)
Chloride: 98 mmol/L (ref 98–111)
Creatinine, Ser: 1.12 mg/dL — ABNORMAL HIGH (ref 0.44–1.00)
GFR, Estimated: 46 mL/min — ABNORMAL LOW (ref >60)
Glucose, Bld: 275 mg/dL — ABNORMAL HIGH (ref 70–99)
Potassium: 4.4 mmol/L (ref 3.5–5.1)
Sodium: 136 mmol/L (ref 135–145)

## 2022-08-31 LAB — ECHOCARDIOGRAM COMPLETE
AR max vel: 2.19 cm2
AV Area VTI: 2.13 cm2
AV Area mean vel: 2.12 cm2
AV Mean grad: 2.3 mmHg
AV Peak grad: 3.6 mmHg
Ao pk vel: 0.95 m/s
Area-P 1/2: 4.89 cm2
Calc EF: 14.4 %
Height: 68 in
S' Lateral: 3.3 cm
Single Plane A2C EF: 18.8 %
Single Plane A4C EF: 26.4 %
Weight: 2384.5 oz

## 2022-08-31 LAB — GLUCOSE, CAPILLARY
Glucose-Capillary: 246 mg/dL — ABNORMAL HIGH (ref 70–99)
Glucose-Capillary: 202 mg/dL — ABNORMAL HIGH (ref 70–99)
Glucose-Capillary: 139 mg/dL — ABNORMAL HIGH (ref 70–99)
Glucose-Capillary: 196 mg/dL — ABNORMAL HIGH (ref 70–99)
Glucose-Capillary: 265 mg/dL — ABNORMAL HIGH (ref 70–99)

## 2022-08-31 LAB — MAGNESIUM: Magnesium: 1.8 mg/dL (ref 1.7–2.4)

## 2022-08-31 LAB — PHOSPHORUS: Phosphorus: 4 mg/dL (ref 2.5–4.6)

## 2022-08-31 SURGERY — OPEN REDUCTION INTERNAL FIXATION (ORIF) PERIPROSTHETIC FRACTURE
Anesthesia: General | Site: Hip | Laterality: Right

## 2022-08-31 MED ORDER — METOPROLOL TARTRATE 5 MG/5ML IV SOLN
INTRAVENOUS | Status: AC
  Administered 2022-08-31: 5 mg via INTRAVENOUS
  Filled 2022-08-31: qty 5

## 2022-08-31 MED ORDER — DEXAMETHASONE SODIUM PHOSPHATE 10 MG/ML IJ SOLN
INTRAMUSCULAR | Status: DC | PRN
  Administered 2022-08-31: 5 mg via INTRAVENOUS

## 2022-08-31 MED ORDER — ACETAMINOPHEN 500 MG PO TABS
1000.0000 mg | ORAL_TABLET | Freq: Once | ORAL | Status: AC
  Administered 2022-08-31: 1000 mg via ORAL
  Filled 2022-08-31: qty 2

## 2022-08-31 MED ORDER — DILTIAZEM HCL-DEXTROSE 125-5 MG/125ML-% IV SOLN (PREMIX)
5.0000 mg/h | INTRAVENOUS | Status: DC
  Administered 2022-09-01: 7.5 mg/h via INTRAVENOUS
  Filled 2022-08-31 (×2): qty 125

## 2022-08-31 MED ORDER — METOPROLOL TARTRATE 5 MG/5ML IV SOLN: 10.0000 mg | Freq: Once | INTRAVENOUS | Status: AC

## 2022-08-31 MED ORDER — SODIUM CHLORIDE 0.9 % IV SOLN: INTRAVENOUS | Status: AC

## 2022-08-31 MED ORDER — PROPOFOL 10 MG/ML IV BOLUS
INTRAVENOUS | Status: DC | PRN
  Administered 2022-08-31: 50 mg via INTRAVENOUS
  Administered 2022-08-31: 40 mg via INTRAVENOUS

## 2022-08-31 MED ORDER — AMIODARONE IV BOLUS ONLY 150 MG/100ML
150.0000 mg | Freq: Once | INTRAVENOUS | Status: DC
  Filled 2022-08-31: qty 100

## 2022-08-31 MED ORDER — TRAMADOL HCL 50 MG PO TABS
ORAL_TABLET | ORAL | Status: AC
  Filled 2022-08-31: qty 1

## 2022-08-31 MED ORDER — IMIPRAMINE HCL 25 MG PO TABS
25.0000 mg | ORAL_TABLET | Freq: Every day | ORAL | Status: DC
  Administered 2022-09-01 – 2022-09-03 (×3): 25 mg via ORAL
  Filled 2022-08-31 (×4): qty 1

## 2022-08-31 MED ORDER — METOCLOPRAMIDE HCL 5 MG/ML IJ SOLN: 5.0000 mg | Freq: Three times a day (TID) | INTRAMUSCULAR | Status: DC | PRN

## 2022-08-31 MED ORDER — TRAMADOL HCL 50 MG PO TABS
25.0000 mg | ORAL_TABLET | Freq: Once | ORAL | Status: AC | PRN
  Administered 2022-08-31: 25 mg via ORAL

## 2022-08-31 MED ORDER — FENTANYL CITRATE (PF) 100 MCG/2ML IJ SOLN
INTRAMUSCULAR | Status: AC
  Administered 2022-08-31: 25 ug via INTRAVENOUS
  Filled 2022-08-31: qty 2

## 2022-08-31 MED ORDER — ADULT MULTIVITAMIN W/MINERALS CH
1.0000 | ORAL_TABLET | Freq: Every day | ORAL | Status: DC
  Administered 2022-09-01 – 2022-09-07 (×7): 1 via ORAL
  Filled 2022-08-31 (×7): qty 1

## 2022-08-31 MED ORDER — ACETAMINOPHEN 325 MG PO TABS: 325.0000 mg | ORAL_TABLET | Freq: Four times a day (QID) | ORAL | Status: DC | PRN

## 2022-08-31 MED ORDER — EPINEPHRINE PF 1 MG/ML IJ SOLN
INTRAMUSCULAR | Status: AC
  Filled 2022-08-31: qty 1

## 2022-08-31 MED ORDER — CEFAZOLIN SODIUM-DEXTROSE 2-4 GM/100ML-% IV SOLN
INTRAVENOUS | Status: AC
  Filled 2022-08-31: qty 100

## 2022-08-31 MED ORDER — DILTIAZEM HCL-DEXTROSE 125-5 MG/125ML-% IV SOLN (PREMIX)
INTRAVENOUS | Status: AC
  Administered 2022-08-31: 5 mg/h via INTRAVENOUS
  Filled 2022-08-31: qty 125

## 2022-08-31 MED ORDER — IRRISEPT - 450ML BOTTLE WITH 0.05% CHG IN STERILE WATER, USP 99.95% OPTIME
TOPICAL | Status: DC | PRN
  Administered 2022-08-31: 450 mL

## 2022-08-31 MED ORDER — SODIUM CHLORIDE (PF) 0.9 % IJ SOLN
INTRAMUSCULAR | Status: AC
  Filled 2022-08-31: qty 10

## 2022-08-31 MED ORDER — METOPROLOL TARTRATE 5 MG/5ML IV SOLN
INTRAVENOUS | Status: AC
  Filled 2022-08-31: qty 5

## 2022-08-31 MED ORDER — FENTANYL CITRATE (PF) 100 MCG/2ML IJ SOLN
INTRAMUSCULAR | Status: AC
  Administered 2022-08-31: 50 ug via INTRAVENOUS
  Filled 2022-08-31: qty 2

## 2022-08-31 MED ORDER — GABAPENTIN 300 MG PO CAPS
600.0000 mg | ORAL_CAPSULE | Freq: Three times a day (TID) | ORAL | Status: DC
  Administered 2022-09-01 – 2022-09-02 (×5): 600 mg via ORAL
  Filled 2022-08-31 (×6): qty 2

## 2022-08-31 MED ORDER — DOCUSATE SODIUM 100 MG PO CAPS
100.0000 mg | ORAL_CAPSULE | Freq: Two times a day (BID) | ORAL | Status: DC
  Administered 2022-09-01 – 2022-09-03 (×5): 100 mg via ORAL
  Filled 2022-08-31 (×6): qty 1

## 2022-08-31 MED ORDER — METOPROLOL TARTRATE 5 MG/5ML IV SOLN
INTRAVENOUS | Status: AC
  Administered 2022-08-31: 10 mg via INTRAVENOUS
  Filled 2022-08-31: qty 10

## 2022-08-31 MED ORDER — DILTIAZEM LOAD VIA INFUSION
10.0000 mg | Freq: Once | INTRAVENOUS | Status: AC
  Administered 2022-08-31: 10 mg via INTRAVENOUS
  Filled 2022-08-31: qty 10

## 2022-08-31 MED ORDER — BUPIVACAINE HCL (PF) 0.25 % IJ SOLN
INTRAMUSCULAR | Status: AC
  Filled 2022-08-31: qty 30

## 2022-08-31 MED ORDER — BUPIVACAINE-EPINEPHRINE (PF) 0.25% -1:200000 IJ SOLN
INTRAMUSCULAR | Status: DC | PRN
  Administered 2022-08-31: 30 mL

## 2022-08-31 MED ORDER — MIRABEGRON ER 50 MG PO TB24
50.0000 mg | ORAL_TABLET | Freq: Every day | ORAL | Status: DC
  Administered 2022-09-01 – 2022-09-06 (×6): 50 mg via ORAL
  Filled 2022-08-31 (×7): qty 1

## 2022-08-31 MED ORDER — ROCURONIUM BROMIDE 100 MG/10ML IV SOLN
INTRAVENOUS | Status: DC | PRN
  Administered 2022-08-31: 10 mg via INTRAVENOUS
  Administered 2022-08-31: 40 mg via INTRAVENOUS
  Administered 2022-08-31: 20 mg via INTRAVENOUS

## 2022-08-31 MED ORDER — DIGOXIN 125 MCG PO TABS
0.1250 mg | ORAL_TABLET | Freq: Once | ORAL | Status: AC
  Administered 2022-08-31: 0.125 mg via ORAL
  Filled 2022-08-31: qty 1

## 2022-08-31 MED ORDER — SODIUM CHLORIDE 0.9 % IR SOLN
Status: DC | PRN
  Administered 2022-08-31: 3000 mL
  Administered 2022-08-31: 100 mL

## 2022-08-31 MED ORDER — TRANEXAMIC ACID 1000 MG/10ML IV SOLN
INTRAVENOUS | Status: AC
  Filled 2022-08-31: qty 10

## 2022-08-31 MED ORDER — FENTANYL CITRATE (PF) 100 MCG/2ML IJ SOLN
INTRAMUSCULAR | Status: DC | PRN
  Administered 2022-08-31 (×2): 50 ug via INTRAVENOUS

## 2022-08-31 MED ORDER — TRANEXAMIC ACID-NACL 1000-0.7 MG/100ML-% IV SOLN
1000.0000 mg | INTRAVENOUS | Status: AC
  Administered 2022-08-31 (×2): 1000 mg via INTRAVENOUS

## 2022-08-31 MED ORDER — ESMOLOL HCL 100 MG/10ML IV SOLN
INTRAVENOUS | Status: AC
  Filled 2022-08-31: qty 10

## 2022-08-31 MED ORDER — DEXMEDETOMIDINE HCL IN NACL 80 MCG/20ML IV SOLN
INTRAVENOUS | Status: DC | PRN
  Administered 2022-08-31: 4 ug via BUCCAL
  Administered 2022-08-31: 8 ug via BUCCAL

## 2022-08-31 MED ORDER — LIDOCAINE HCL (CARDIAC) PF 100 MG/5ML IV SOSY
PREFILLED_SYRINGE | INTRAVENOUS | Status: DC | PRN
  Administered 2022-08-31: 40 mg via INTRAVENOUS

## 2022-08-31 MED ORDER — PHENYLEPHRINE HCL-NACL 20-0.9 MG/250ML-% IV SOLN
INTRAVENOUS | Status: DC | PRN
  Administered 2022-08-31 (×2): 20 ug/min via INTRAVENOUS
  Administered 2022-08-31: 40 ug/min via INTRAVENOUS

## 2022-08-31 MED ORDER — AMIODARONE HCL IN DEXTROSE 360-4.14 MG/200ML-% IV SOLN: 60.0000 mg/h | INTRAVENOUS | Status: DC

## 2022-08-31 MED ORDER — ROCURONIUM BROMIDE 10 MG/ML (PF) SYRINGE
PREFILLED_SYRINGE | INTRAVENOUS | Status: AC
  Filled 2022-08-31: qty 10

## 2022-08-31 MED ORDER — ENSURE ENLIVE PO LIQD
237.0000 mL | Freq: Two times a day (BID) | ORAL | Status: DC
  Administered 2022-09-01 – 2022-09-03 (×3): 237 mL via ORAL
  Filled 2022-08-31 (×4): qty 237

## 2022-08-31 MED ORDER — FENTANYL CITRATE (PF) 100 MCG/2ML IJ SOLN
25.0000 ug | INTRAMUSCULAR | Status: AC | PRN
  Administered 2022-08-31: 25 ug via INTRAVENOUS
  Administered 2022-08-31: 50 ug via INTRAVENOUS
  Administered 2022-08-31 (×2): 25 ug via INTRAVENOUS

## 2022-08-31 MED ORDER — PHENYLEPHRINE HCL (PRESSORS) 10 MG/ML IV SOLN
INTRAVENOUS | Status: AC
  Filled 2022-08-31: qty 1

## 2022-08-31 MED ORDER — ONDANSETRON HCL 4 MG/2ML IJ SOLN: 4.0000 mg | Freq: Four times a day (QID) | INTRAMUSCULAR | Status: DC | PRN

## 2022-08-31 MED ORDER — ALIGN 4 MG PO CAPS: 4.0000 mg | ORAL_CAPSULE | ORAL | Status: DC

## 2022-08-31 MED ORDER — SUGAMMADEX SODIUM 200 MG/2ML IV SOLN
INTRAVENOUS | Status: DC | PRN
  Administered 2022-08-31: 150 mg via INTRAVENOUS

## 2022-08-31 MED ORDER — ACETAMINOPHEN 10 MG/ML IV SOLN
INTRAVENOUS | Status: AC
  Filled 2022-08-31: qty 100

## 2022-08-31 MED ORDER — MORPHINE SULFATE (PF) 2 MG/ML IV SOLN: 0.5000 mg | INTRAVENOUS | Status: DC | PRN

## 2022-08-31 MED ORDER — INSULIN ASPART 100 UNIT/ML IJ SOLN: 4.0000 [IU] | Freq: Once | INTRAMUSCULAR | Status: DC

## 2022-08-31 MED ORDER — PANTOPRAZOLE SODIUM 40 MG PO TBEC
40.0000 mg | DELAYED_RELEASE_TABLET | Freq: Every day | ORAL | Status: DC
  Administered 2022-09-01 – 2022-09-07 (×7): 40 mg via ORAL
  Filled 2022-08-31 (×7): qty 1

## 2022-08-31 MED ORDER — TRAMADOL HCL 50 MG PO TABS
50.0000 mg | ORAL_TABLET | Freq: Four times a day (QID) | ORAL | Status: DC | PRN
  Administered 2022-09-01: 50 mg via ORAL
  Filled 2022-08-31: qty 1

## 2022-08-31 MED ORDER — FENTANYL CITRATE (PF) 100 MCG/2ML IJ SOLN
INTRAMUSCULAR | Status: AC
  Filled 2022-08-31: qty 2

## 2022-08-31 MED ORDER — PHENYLEPHRINE HCL (PRESSORS) 10 MG/ML IV SOLN
INTRAVENOUS | Status: DC | PRN
  Administered 2022-08-31: 160 ug via INTRAVENOUS
  Administered 2022-08-31: 80 ug via INTRAVENOUS
  Administered 2022-08-31 (×3): 160 ug via INTRAVENOUS

## 2022-08-31 MED ORDER — CEFAZOLIN SODIUM-DEXTROSE 2-4 GM/100ML-% IV SOLN
2.0000 g | Freq: Once | INTRAVENOUS | Status: AC
  Administered 2022-08-31: 2 g via INTRAVENOUS

## 2022-08-31 MED ORDER — METOCLOPRAMIDE HCL 10 MG PO TABS: 5.0000 mg | ORAL_TABLET | Freq: Three times a day (TID) | ORAL | Status: DC | PRN

## 2022-08-31 MED ORDER — ESMOLOL HCL 100 MG/10ML IV SOLN
INTRAVENOUS | Status: DC | PRN
  Administered 2022-08-31 (×3): 10 mg via INTRAVENOUS

## 2022-08-31 MED ORDER — METOPROLOL TARTRATE 5 MG/5ML IV SOLN
INTRAVENOUS | Status: DC | PRN
  Administered 2022-08-31: 2.5 mg via INTRAVENOUS

## 2022-08-31 MED ORDER — HYDROCODONE-ACETAMINOPHEN 5-325 MG PO TABS
1.0000 | ORAL_TABLET | ORAL | Status: DC | PRN
  Filled 2022-08-31: qty 2

## 2022-08-31 MED ORDER — AMIODARONE LOAD VIA INFUSION: 150.0000 mg | Freq: Once | INTRAVENOUS | Status: DC

## 2022-08-31 MED ORDER — ACETAMINOPHEN 10 MG/ML IV SOLN
1000.0000 mg | Freq: Once | INTRAVENOUS | Status: DC | PRN
  Administered 2022-08-31: 1000 mg via INTRAVENOUS

## 2022-08-31 MED ORDER — ONDANSETRON HCL 4 MG/2ML IJ SOLN
INTRAMUSCULAR | Status: DC | PRN
  Administered 2022-08-31: 4 mg via INTRAVENOUS

## 2022-08-31 MED ORDER — MENTHOL 3 MG MT LOZG: 1.0000 | LOZENGE | OROMUCOSAL | Status: DC | PRN

## 2022-08-31 MED ORDER — PHENOL 1.4 % MT LIQD: 1.0000 | OROMUCOSAL | Status: DC | PRN

## 2022-08-31 MED ORDER — DEXMEDETOMIDINE HCL IN NACL 80 MCG/20ML IV SOLN
INTRAVENOUS | Status: AC
  Filled 2022-08-31: qty 20

## 2022-08-31 MED ORDER — ONDANSETRON HCL 4 MG PO TABS: 4.0000 mg | ORAL_TABLET | Freq: Four times a day (QID) | ORAL | Status: DC | PRN

## 2022-08-31 MED ORDER — TRANEXAMIC ACID-NACL 1000-0.7 MG/100ML-% IV SOLN
INTRAVENOUS | Status: AC
  Filled 2022-08-31: qty 100

## 2022-08-31 MED ORDER — AMIODARONE HCL IN DEXTROSE 360-4.14 MG/200ML-% IV SOLN
30.0000 mg/h | INTRAVENOUS | Status: DC
  Filled 2022-08-31: qty 200

## 2022-08-31 MED ORDER — PHENYLEPHRINE 80 MCG/ML (10ML) SYRINGE FOR IV PUSH (FOR BLOOD PRESSURE SUPPORT)
PREFILLED_SYRINGE | INTRAVENOUS | Status: AC
  Filled 2022-08-31: qty 10

## 2022-08-31 MED ORDER — AMIODARONE HCL IN DEXTROSE 360-4.14 MG/200ML-% IV SOLN: 30.0000 mg/h | INTRAVENOUS | Status: DC

## 2022-08-31 MED ORDER — BUPIVACAINE LIPOSOME 1.3 % IJ SUSP
INTRAMUSCULAR | Status: AC
  Filled 2022-08-31: qty 20

## 2022-08-31 MED ORDER — HALOPERIDOL LACTATE 5 MG/ML IJ SOLN
2.0000 mg | Freq: Once | INTRAMUSCULAR | Status: AC
  Administered 2022-08-31: 2 mg via INTRAVENOUS
  Filled 2022-08-31: qty 1

## 2022-08-31 MED ORDER — METOPROLOL TARTRATE 5 MG/5ML IV SOLN: 5.0000 mg | Freq: Once | INTRAVENOUS | Status: AC

## 2022-08-31 MED ORDER — APIXABAN 2.5 MG PO TABS
2.5000 mg | ORAL_TABLET | Freq: Two times a day (BID) | ORAL | Status: DC
  Administered 2022-09-01 – 2022-09-07 (×13): 2.5 mg via ORAL
  Filled 2022-08-31 (×13): qty 1

## 2022-08-31 MED ORDER — AMIODARONE HCL IN DEXTROSE 360-4.14 MG/200ML-% IV SOLN
60.0000 mg/h | INTRAVENOUS | Status: DC
  Filled 2022-08-31: qty 200

## 2022-08-31 MED ORDER — AMIODARONE LOAD VIA INFUSION
150.0000 mg | Freq: Once | INTRAVENOUS | Status: DC
  Filled 2022-08-31: qty 83.34

## 2022-08-31 MED ORDER — ACETAMINOPHEN 500 MG PO TABS
1000.0000 mg | ORAL_TABLET | Freq: Two times a day (BID) | ORAL | Status: DC
  Administered 2022-09-01 – 2022-09-05 (×9): 1000 mg via ORAL
  Filled 2022-08-31 (×10): qty 2

## 2022-08-31 MED ORDER — ONDANSETRON HCL 4 MG/2ML IJ SOLN
INTRAMUSCULAR | Status: AC
  Filled 2022-08-31: qty 2

## 2022-08-31 MED ORDER — SODIUM CHLORIDE FLUSH 0.9 % IV SOLN
INTRAVENOUS | Status: AC
  Filled 2022-08-31: qty 20

## 2022-08-31 SURGICAL SUPPLY — 96 items
BIT DRILL 4.3 (BIT) ×1
BIT DRILL 4.3X300MM (BIT) IMPLANT
BIT DRILL LONG 3.3 (BIT) IMPLANT
BIT DRILL QC 3.3X195 (BIT) IMPLANT
BLADE SAGITTAL AGGR TOOTH XLG (BLADE) ×1 IMPLANT
BNDG COHESIVE 6X5 TAN ST LF (GAUZE/BANDAGES/DRESSINGS) ×1 IMPLANT
CABLE CERLAGE W/CRIMP 1.8 (Cable) IMPLANT
CAP LOCK NCB (Cap) IMPLANT
CHLORAPREP W/TINT 26 (MISCELLANEOUS) ×2 IMPLANT
COVER BACK TABLE REUSABLE LG (DRAPES) ×1 IMPLANT
COVER SET STULBERG POSITIONER (MISCELLANEOUS) ×1 IMPLANT
DERMABOND ADVANCED .7 DNX12 (GAUZE/BANDAGES/DRESSINGS) ×1 IMPLANT
DRAPE 3/4 80X56 (DRAPES) ×1 IMPLANT
DRAPE C-ARM XRAY 36X54 (DRAPES) IMPLANT
DRAPE C-ARMOR (DRAPES) IMPLANT
DRAPE IMP U-DRAPE 54X76 (DRAPES) ×1 IMPLANT
DRAPE INCISE IOBAN 66X60 STRL (DRAPES) ×1 IMPLANT
DRAPE POUCH INSTRU U-SHP 10X18 (DRAPES) ×1 IMPLANT
DRAPE U-SHAPE 47X51 STRL (DRAPES) ×1 IMPLANT
DRSG MEPILEX SACRM 8.7X9.8 (GAUZE/BANDAGES/DRESSINGS) ×1 IMPLANT
DRSG OPSITE POSTOP 4X10 (GAUZE/BANDAGES/DRESSINGS) IMPLANT
DRSG OPSITE POSTOP 4X12 (GAUZE/BANDAGES/DRESSINGS) IMPLANT
DRSG OPSITE POSTOP 4X8 (GAUZE/BANDAGES/DRESSINGS) IMPLANT
ELECT REM PT RETURN 9FT ADLT (ELECTROSURGICAL) ×1
ELECTRODE REM PT RTRN 9FT ADLT (ELECTROSURGICAL) ×1 IMPLANT
GLOVE BIO SURGEON STRL SZ8 (GLOVE) ×1 IMPLANT
GLOVE BIOGEL PI IND STRL 8 (GLOVE) ×1 IMPLANT
GLOVE PI ORTHO PRO STRL 7.5 (GLOVE) ×2 IMPLANT
GLOVE PI ORTHO PRO STRL SZ8 (GLOVE) ×2 IMPLANT
GLOVE SURG SYN 7.5  E (GLOVE) ×2
GLOVE SURG SYN 7.5 E (GLOVE) ×2 IMPLANT
GLOVE SURG SYN 7.5 PF PI (GLOVE) ×2 IMPLANT
GOWN STRL REUS W/ TWL LRG LVL3 (GOWN DISPOSABLE) ×1 IMPLANT
GOWN STRL REUS W/ TWL XL LVL3 (GOWN DISPOSABLE) ×2 IMPLANT
GOWN STRL REUS W/TWL LRG LVL3 (GOWN DISPOSABLE) ×2
GOWN STRL REUS W/TWL XL LVL3 (GOWN DISPOSABLE) ×2
HANDLE YANKAUER SUCT OPEN TIP (MISCELLANEOUS) ×1 IMPLANT
HOLDER FOLEY CATH W/STRAP (MISCELLANEOUS) ×1 IMPLANT
HOOD PEEL AWAY T7 (MISCELLANEOUS) ×2 IMPLANT
IV NS IRRIG 3000ML ARTHROMATIC (IV SOLUTION) ×1 IMPLANT
JET LAVAGE IRRISEPT WOUND (IRRIGATION / IRRIGATOR) ×1
K-WIRE 2.0 (WIRE) ×1
K-WIRE FXSTD 280X2XNS SS (WIRE) ×1
KIT TURNOVER KIT A (KITS) ×1 IMPLANT
KWIRE FXSTD 280X2XNS SS (WIRE) IMPLANT
LAVAGE JET IRRISEPT WOUND (IRRIGATION / IRRIGATOR) IMPLANT
LOCKPLATE CABLE BUTTON NCP HIP (Orthopedic Implant) IMPLANT
MANIFOLD NEPTUNE II (INSTRUMENTS) ×1 IMPLANT
MARKER SKIN DUAL TIP RULER LAB (MISCELLANEOUS) ×1 IMPLANT
MAT ABSORB  FLUID 56X50 GRAY (MISCELLANEOUS) ×1
MAT ABSORB FLUID 56X50 GRAY (MISCELLANEOUS) ×1 IMPLANT
NDL SPNL 20GX3.5 QUINCKE YW (NEEDLE) ×1 IMPLANT
NEEDLE SPNL 20GX3.5 QUINCKE YW (NEEDLE) ×1 IMPLANT
PACK HIP PROSTHESIS (MISCELLANEOUS) ×1 IMPLANT
PENCIL SMOKE EVACUATOR (MISCELLANEOUS) ×1 IMPLANT
PILLOW ABDUCTION FOAM SM (MISCELLANEOUS) ×1 IMPLANT
PLATE LOCK TROCH 64 NARROW RT (Plate) IMPLANT
PLATE PROXIMAL FEMUR 12H RT (Plate) IMPLANT
PULSAVAC PLUS IRRIG FAN TIP (DISPOSABLE) ×1
RETRIEVER SUT HEWSON (MISCELLANEOUS) ×1 IMPLANT
SCREW LOCK 3.5X30 NS (Miscellaneous) IMPLANT
SCREW LOCK TI 3.5X22 (Screw) IMPLANT
SCREW LOCK TI 3.5X24 (Screw) IMPLANT
SCREW LOCK TI 3.5X26 (Screw) IMPLANT
SCREW LOCK TI 3.5X28 (Screw) IMPLANT
SCREW NCB 4.0 28MM (Screw) IMPLANT
SCREW NCB 4.0MX30M (Screw) IMPLANT
SCREW NCB 4.0MX34M (Screw) IMPLANT
SCREW NCB 4.0X36MM (Screw) IMPLANT
SCREW NCB 4.0X40MM (Screw) IMPLANT
SCREW NCB 5.0X34MM (Screw) IMPLANT
SCREW NCB 5.0X36MM (Screw) IMPLANT
SCREW UNI CORTICAL 5.0X14MM (Screw) ×1 IMPLANT
SCREW UNICORTICAL 5.0X14 (Screw) IMPLANT
SLEEVE SCD COMPRESS KNEE MED (STOCKING) ×1 IMPLANT
SOLUTION IRRIG SURGIPHOR (IV SOLUTION) ×1 IMPLANT
SUT BONE WAX W31G (SUTURE) ×1 IMPLANT
SUT DVC 2 QUILL PDO  T11 36X36 (SUTURE) ×1
SUT DVC 2 QUILL PDO T11 36X36 (SUTURE) ×1 IMPLANT
SUT ETHIBOND #5 BRAIDED 30INL (SUTURE) ×1 IMPLANT
SUT MNCRL AB 3-0 PS2 27 (SUTURE) IMPLANT
SUT QUILL MONODERM 3-0 PS-2 (SUTURE) ×1 IMPLANT
SUT STRATAFIX SPIRAL PDS+ 0 30 (SUTURE) IMPLANT
SUT VIC AB 0 CT1 36 (SUTURE) ×1 IMPLANT
SUT VIC AB 2-0 CT2 27 (SUTURE) ×2 IMPLANT
SUT VICRYL 1-0 27IN ABS (SUTURE) ×1
SUTURE VICRYL 1-0 27IN ABS (SUTURE) ×1 IMPLANT
SYR 10ML LL (SYRINGE) ×1 IMPLANT
SYR 30ML LL (SYRINGE) ×2 IMPLANT
TIP FAN IRRIG PULSAVAC PLUS (DISPOSABLE) ×1 IMPLANT
TOWEL OR 17X26 4PK STRL BLUE (TOWEL DISPOSABLE) IMPLANT
TRAP FLUID SMOKE EVACUATOR (MISCELLANEOUS) ×1 IMPLANT
TRAY FOLEY SLVR 16FR LF STAT (SET/KITS/TRAYS/PACK) ×1 IMPLANT
TUBE KAMVAC SUCTION (TUBING) IMPLANT
WAND WEREWOLF FASTSEAL 6.0 (MISCELLANEOUS) ×1 IMPLANT
WATER STERILE IRR 1000ML POUR (IV SOLUTION) ×1 IMPLANT

## 2022-08-31 NOTE — Anesthesia Procedure Notes (Signed)
Procedure Name: Intubation Date/Time: 08/31/2022 1:13 PM  Performed by: Cammie Sickle, CRNAPre-anesthesia Checklist: Patient identified, Patient being monitored, Timeout performed, Emergency Drugs available and Suction available Patient Re-evaluated:Patient Re-evaluated prior to induction Oxygen Delivery Method: Circle system utilized Preoxygenation: Pre-oxygenation with 100% oxygen Induction Type: IV induction Ventilation: Mask ventilation without difficulty and Oral airway inserted - appropriate to patient size Laryngoscope Size: 3 and McGraph Grade View: Grade I Tube type: Oral Tube size: 7.0 mm Number of attempts: 1 Airway Equipment and Method: Stylet Placement Confirmation: ETT inserted through vocal cords under direct vision, positive ETCO2 and breath sounds checked- equal and bilateral Secured at: 21 cm Tube secured with: Tape Dental Injury: Teeth and Oropharynx as per pre-operative assessment

## 2022-08-31 NOTE — Interval H&P Note (Signed)
History and physical updated with family and patient. They agree with plan to proceed to the OR for right femur ORIF versus ORIF w/ hemiarthroplasty revision pending intraoperative evaluation. All questions answered.

## 2022-08-31 NOTE — Progress Notes (Signed)
  Transition of Care [facility]) Screening Note   Patient Details  Name: Dawn Burnett Date of Birth: 10/02/1928   Transition of Care [facility]) CM/SW Contact:    Quin Hoop, LCSW Phone Number: 08/31/2022, 10:42 AM    Transition of Care Department [facility]) has reviewed patient and no TOC needs have been identified at this time. We will continue to monitor patient advancement through interdisciplinary progression rounds. If new patient transition needs arise, please place a TOC consult.

## 2022-08-31 NOTE — Progress Notes (Signed)
SLP Cancellation Note  Patient Details Name: GLADINE PLUDE MRN: [id number] DOB: 17-Mar-1929   Cancelled treatment:       Reason Eval/Treat Not Completed: Medical issues which prohibited therapy. Pt NPO for surgery.  Cherrie Gauze, M.S., [facility] [phone] ([place])  Quintella Baton 08/31/2022, 8:20 AM

## 2022-08-31 NOTE — Transfer of Care (Signed)
Immediate Anesthesia Transfer of Care Note  Patient: Dawn Burnett  Procedure(s) Performed: OPEN REDUCTION INTERNAL FIXATION (ORIF) PERIPROSTHETIC FEMORAL SHAFT FRACTURE (Right: Hip)  Patient Location: PACU  Anesthesia Type:General  Level of Consciousness: awake  Airway & Oxygen Therapy: Patient Spontanous Breathing and Patient connected to face mask oxygen  Post-op Assessment: Report given to RN and Post -op Vital signs reviewed and stable  Post vital signs: Reviewed and stable  Last Vitals:  Vitals Value Taken Time  BP 132/68 08/31/22 1638  Temp 36.4 C 08/31/22 1635  Pulse 83 08/31/22 1643  Resp 25 08/31/22 1645  SpO2 99 % 08/31/22 1643  Vitals shown include unvalidated device data.  Last Pain:  Vitals:   08/31/22 1121  TempSrc: Oral  PainSc:          Complications: No notable events documented.

## 2022-08-31 NOTE — Progress Notes (Signed)
CROSS COVER NOTE  NAME: Dawn Burnett MRN: [id number] DOB : 10/30/1928 ATTENDING PHYSICIAN: Val Riles, MD    Date of Service   08/31/2022   HPI/Events of Note   Message received from RN  "requesting meds for agitation - Dr. Karel Jarvis did surgery before and states this is baseline for pt post anesthesia, extreme confusion/agitation for about 24 hours. TY! Pt trying to get out of bed, non stop yelling, recently received pain meds, family at the bedside but she does not understand d/t confusion"   2 mg Haldol ordered.  2220: Now calm and preoccupied with pulse ox prob, she is not able to answer interview questions at this time. Moves bilateral extremities upper and lower equally and able to squeeze bilateral hands to command.   0200: RN reports M(r)s Thede had a run of VT/VFIB (see rhythm strip below). RN also reports M(r)s Moro endorsed chest pain and morphine was administered. At time of my bedside evaluation M(r)s Dohmen was unable to answer interview questions, and unable to endorse or deny pain, she appears similar to bedside evaluation around 2200.    Interventions   Assessment/Plan:  EKG-> unchanged from prior AFIB with LBBB HR 74  Agitation 2 mg Haldol  Non Sustained Polymorphic Ventricular Tachycardia, [place] Continue Cardizem, currently at '5mg'$ /hr, titrate up  2 gm Mg IV Labs- K 4.5 Mg 1.8 Ca 8.6 (9.1 corrected). HGB 9.6   Case discussed with night attending. EKG + above rhythm strip reviewed with night attending, who is in agreement with above interventions. Recommends discontinuing Cardizem and starting Amiodarone if VT recurs.    NSTEMI Trend Troponin - 1667--> 1544; BNP 850.7 Consider Heparin when safe to anticoagulate per surgery Cardiology consulted and following case- Message sent to Dr Humphrey Rolls with update of overnight events      To reach the provider On-Call:   [place] see care teams to locate the attending and reach out to them via  [email or web address] Password: [email or web address] 7PM-7AM contact night-coverage If you still have difficulty reaching the appropriate provider, please page the [facility] (Director on Call) for [facility] on amion for assistance  This document was prepared using Systems analyst and may include unintentional dictation errors.  Neomia Glass DNP, MBA, FNP-BC Nurse Practitioner [facility] Pager [phone]

## 2022-08-31 NOTE — Plan of Care (Signed)

## 2022-08-31 NOTE — Anesthesia Preprocedure Evaluation (Addendum)
Anesthesia Evaluation  Patient identified by MRN, date of birth, ID band Patient awake and Patient confused    Reviewed: Allergy & Precautions, H&P , NPO status , Patient's Chart, lab work & pertinent test results  Airway Mallampati: II  TM Distance: >3 FB Neck ROM: full    Dental  (+) Chipped   Pulmonary neg pulmonary ROS   Pulmonary exam normal        Cardiovascular Exercise Tolerance: Poor hypertension, +CHF (HFrEF)  Normal cardiovascular exam+ dysrhythmias (history of afib. LBBB. anticoagulation) Atrial Fibrillation      Neuro/Psych  Neuromuscular disease (neuropathy)  negative psych ROS   GI/Hepatic Neg liver ROS, hiatal hernia,GERD  Controlled,,Dysphagia H/o Gastrointestinal stromal tumor (GIST) of stomach   Endo/Other  diabetes, Poorly Controlled, Type 2Hypothyroidism    Renal/GU Renal InsufficiencyRenal disease     Musculoskeletal   Abdominal Normal abdominal exam  (+)   Peds  Hematology  (+) Blood dyscrasia, anemia thrombocytopenia   Anesthesia Other Findings Past Medical History: No date: Arthritis No date: Atrial fibrillation (HCC) No date: CHF (congestive heart failure) (HCC) No date: Diabetes mellitus without complication (HCC) No date: GERD (gastroesophageal reflux disease) No date: Hiatal hernia No date: Hypertension No date: Neuropathy No date: Skin cancer, basal cell No date: Thyroid disease  Past Surgical History: No date: ABDOMINAL HYSTERECTOMY No date: APPENDECTOMY No date: BREAST SURGERY No date: cateract extraction No date: EYE SURGERY     Comment:  done by Dr. Vickki Muff No date: eyelid     Comment:  drooping right eyelid No date: KNEE SURGERY 06/19/2009: REMOVAL OF GASTROINTESTINAL STOMATIC  TUMOR OF STOMACH No date: right hip surgery No date: right total knee replacement  BMI    Body Mass Index: 22.66 kg/m      Reproductive/Obstetrics negative OB ROS                              Anesthesia Physical Anesthesia Plan  ASA: 3  Anesthesia Plan: General ETT   Post-op Pain Management: Tylenol PO (pre-op)* and Regional block*   Induction: Intravenous  PONV Risk Score and Plan: 2 and Ondansetron, Dexamethasone and Treatment may vary due to age or medical condition  Airway Management Planned: Oral ETT  Additional Equipment: Arterial line  Intra-op Plan:   Post-operative Plan: Extubation in OR  Informed Consent: I have reviewed the patients History and Physical, chart, labs and discussed the procedure including the risks, benefits and alternatives for the proposed anesthesia with the patient or authorized representative who has indicated his/her understanding and acceptance.   Patient has DNR.  Suspend DNR.   Dental Advisory Given and Consent reviewed with POA  Plan Discussed with: CRNA and Surgeon  Anesthesia Plan Comments:         Anesthesia Quick Evaluation

## 2022-08-31 NOTE — Progress Notes (Signed)
*  PRELIMINARY RESULTS* Echocardiogram 2D Echocardiogram has been performed.  Dawn Burnett 08/31/2022, 7:53 AM

## 2022-08-31 NOTE — Progress Notes (Signed)
SUBJECTIVE: Patient is a 89 y.o. female with medical history significant for Atrial fibrillation on Eliquis, HTN, history of gastrointestinal stromal tumor, CKD 3a, systolic CHF (EF 49% 02/262), hypothyroidism and non-insulin-dependent type 2 diabetes who presented to the ED on 08/30/22 following an accidental fall after she slipped when she got up to go to the bathroom, landing on her right hip.  Patient found to have a right hip periprosthetic proximal femur fracture around a cemented hemiarthroplasty.  Vitals:   08/30/22 2045 08/30/22 2359 08/31/22 0324 08/31/22 0818  BP: (!) 144/51 (!) 146/64 (!) 157/72 (!) 148/78  Pulse: 69 (!) 53 87 88  Resp: '15 20  16  '$ Temp:  98 F (36.7 C) 98.4 F (36.9 C) 98.6 F (37 C)  TempSrc:      SpO2: 93% 100% 99% 100%  Weight:      Height:        Intake/Output Summary (Last 24 hours) at 08/31/2022 0849 Last data filed at 08/31/2022 0521 Gross per 24 hour  Intake --  Output 1400 ml  Net -1400 ml    LABS: Basic Metabolic Panel: Recent Labs    08/30/22 0238 08/31/22 0344  NA 138 136  K 4.2 4.4  CL 103 98  CO2 25 29  GLUCOSE 217* 275*  BUN 24* 24*  CREATININE 0.95 1.12*  CALCIUM 9.0 9.1  MG  --  1.8  PHOS  --  4.0   Liver Function Tests: No results for input(s): "AST", "ALT", "ALKPHOS", "BILITOT", "PROT", "ALBUMIN" in the last 72 hours. No results for input(s): "LIPASE", "AMYLASE" in the last 72 hours. CBC: Recent Labs    08/30/22 0238 08/31/22 0344  WBC 9.7 9.6  NEUTROABS 7.7  --   HGB 11.5* 10.7*  HCT 35.6* 32.2*  MCV 98.6 95.8  PLT 131* 145*   Cardiac Enzymes: No results for input(s): "CKTOTAL", "CKMB", "CKMBINDEX", "TROPONINI" in the last 72 hours. BNP: Invalid input(s): "POCBNP" D-Dimer: No results for input(s): "DDIMER" in the last 72 hours. Hemoglobin A1C: No results for input(s): "HGBA1C" in the last 72 hours. Fasting Lipid Panel: No results for input(s): "CHOL", "HDL", "LDLCALC", "TRIG", "CHOLHDL", "LDLDIRECT" in  the last 72 hours. Thyroid Function Tests: No results for input(s): "TSH", "T4TOTAL", "T3FREE", "THYROIDAB" in the last 72 hours.  Invalid input(s): "FREET3" Anemia Panel: Recent Labs    08/30/22 0238 08/30/22 0949  VITAMINB12  --  614  FOLATE >40.0  --   TIBC 370  --   IRON 39  --      PHYSICAL EXAM General: Well developed, well nourished, in no acute distress HEENT:  Normocephalic and atramatic Neck:  No JVD.  Lungs: Clear bilaterally to auscultation and percussion. Heart: HRRR . Normal S1 and S2 without gallops or murmurs.  Abdomen: Bowel sounds are positive, abdomen soft and non-tender  Msk:  Back normal, normal gait. Normal strength and tone for age. Extremities: No clubbing, cyanosis or edema.   Neuro: Alert and oriented X 3. Psych:  Good affect, responds appropriately  TELEMETRY: atrial fibrillation  ASSESSMENT AND PLAN: Patient scheduled for hip surgery this morning. Patient was seen by cardiology on 08/30/22 for risk stratification, preop clearance due to history of A-fib and CHF. Will continue to follow.   Principal Problem:   Periprosthetic fracture around internal prosthetic right hip joint ([place]) Active Problems:   Atrial fibrillation ([place])   Essential (primary) hypertension   Adult hypothyroidism   Uncontrolled type 2 diabetes mellitus with hyperglycemia, without long-term current use of insulin (  [place])   Stage 3a chronic kidney disease (HCC)   Chronic anticoagulation   History of malignant gastrointestinal stromal tumor (GIST)   Chronic systolic CHF (congestive heart failure) ([place])    Dawn Mcclenahan, FNP-C 08/31/2022 8:49 AM

## 2022-08-31 NOTE — Progress Notes (Signed)
Initial Nutrition Assessment  DOCUMENTATION CODES:   Not applicable  INTERVENTION:   -Once diet is resumed, add:   -Ensure Enlive po BID, each supplement provides 350 kcal and 20 grams of protein -MVI with minerals daily  NUTRITION DIAGNOSIS:   Increased nutrient needs related to post-op healing as evidenced by estimated needs.  GOAL:   Patient will meet greater than or equal to 90% of their needs  MONITOR:   PO intake, Supplement acceptance  REASON FOR ASSESSMENT:   Consult Assessment of nutrition requirement/status, Hip fracture protocol  ASSESSMENT:   Pt with medical history significant for Atrial fibrillation on Eliquis, HTN, history of gastrointestinal stromal tumor, CKD 3a, systolic CHF (EF 78% 12/8848), hypothyroidism and non-insulin-dependent type 2 diabetes who presents following an accidental fall after she slipped when she got up to go to the bathroom, landing on her right hip.  Pt admitted with rt hip fracture s/p fall.   1/21- s/p BSE- regular diet with thin liquids  Reviewed I/O's: -1.4 L x 24 hours  UOP:1 .4 L x 24 hours   Per orthopedics notes, plan for surgery today. Pt currently NPO for procedure.   Pt lying in bed at time of visit. RD attempted to speak with pt, however, pt is very hard of hearing and unable to provide history. Despite several tactics, pt continued to state "I can't hear you". No family at bedside to provide additional history.   Reviewed wt hx; wt has been stable over the past 10 months.   Pt with increased nutritional needs for post-operative healing and would benefit from addition of oral nutrition supplements.   Per TOC notes, awaiting therapy evaluations post-operatively. Per MD notes, pt is a DNR.   Medications reviewed and include lasix and 0.9% sodium chloride infusion @ 50 ml/hr.   Lab Results  Component Value Date   HGBA1C 7.6 (A) 08/27/2022   PTA DM medications are 100 mg [place] daily. Per ADA's Standards of  Medical Care of Diabetes, glycemic targets for older adults who have multiple co-morbidities, cognitive impairments, and functional dependence should be less stringent (Hgb A1c <8.0-8.5).    Labs reviewed: CBGS: 151-246 (inpatient orders for glycemic control are 0-15 units insulin aspart TID with meals and 0-5 units insulin aspart daily at bedtime).    NUTRITION - FOCUSED PHYSICAL EXAM:  Flowsheet Row Most Recent Value  Orbital Region No depletion  Upper Arm Region Mild depletion  Thoracic and Lumbar Region No depletion  Buccal Region No depletion  Temple Region No depletion  Clavicle Bone Region Moderate depletion  Clavicle and Acromion Bone Region Mild depletion  Scapular Bone Region Mild depletion  Dorsal Hand Mild depletion  Patellar Region No depletion  Anterior Thigh Region No depletion  Posterior Calf Region No depletion  Edema (RD Assessment) Mild  Hair Reviewed  Eyes Reviewed  Mouth Reviewed  Skin Reviewed  Nails Reviewed       Diet Order:   Diet Order             Diet NPO time specified  Diet effective now                   EDUCATION NEEDS:   Not appropriate for education at this time  Skin:  Skin Assessment: Reviewed RN Assessment  Last BM:  08/29/22  Height:   Ht Readings from Last 1 Encounters:  08/30/22 '5\' 8"'$  (1.727 m)    Weight:   Wt Readings from Last 1 Encounters:  08/30/22 67.6  kg    Ideal Body Weight:  63.6 kg  BMI:  Body mass index is 22.66 kg/m.  Estimated Nutritional Needs:   Kcal:  1700-1900  Protein:  85-100 grams  Fluid:  >1.7 L    Loistine Chance, RD, LDN, [place] Registered Dietitian II Certified Diabetes Care and Education Specialist Please refer to [facility] for RD and/or RD on-call/weekend/after hours pager

## 2022-08-31 NOTE — Progress Notes (Signed)
[facility] Progress Note  Patient: Dawn Burnett    VOZ:[id number]  DOA: 08/30/2022     Date of Service: the patient was seen and examined on 08/31/2022  Chief Complaint  Patient presents with   Hip Pain   Fall   Brief hospital course:  Dawn Burnett is a 89 y.o. female with medical history significant for Atrial fibrillation on Eliquis, HTN, history of gastrointestinal stromal tumor, CKD 3a, systolic CHF (EF 42% 12/9561), hypothyroidism and non-insulin-dependent type 2 diabetes who presents to the ED following an accidental fall after she slipped when she got up to go to the bathroom, landing on her right hip.  She denies hitting anywhere else and experiences pain only in her right hip.  History is provided by daughter at bedside as patient is very hard of hearing. ED course and data review: BP 176/77 with otherwise normal vitals.  Labs notable for glucose 217, hemoglobin 11.5EKG, personally viewed and interpreted showing A-fib at 92 with LBBB.  X-ray showing periprosthetic right proximal femur fracture.  Chest x-ray was clear.  The ED provider spoke with orthopedist, Dr. Karel Jarvis who will take patient to the OR following a 24-hour Eliquis washout.  Hospitalist consulted for admission.     Assessment and Plan:  # Periprosthetic fracture around internal prosthetic right hip joint ([place]) Held Eliquis for washout Orthopedic surgery was consulted, plan is for ORIF today Cardiology was consulted for preop clearance, patient remains at moderate to high risk.  Due to comorbidities Continue as needed medication for pain control Continue fall precautions Follow PT OT eval after ORIF   Atrial fibrillation (HCC) Chronic anticoagulation Hold Eliquis for anticipated surgery Continue metoprolol   Chronic systolic CHF (congestive heart failure) (HCC) Clinically euvolemic, last EF on record 36% in July 2020 Continue digoxin, furosemide, metoprolol, Entresto and spironolactone Will get cardiology  consult for preoperative cardiac clearance   Uncontrolled type 2 diabetes mellitus with hyperglycemia, without long-term current use of insulin (HCC) Sliding scale insulin coverage   History of malignant gastrointestinal stromal tumor (GIST) No acute issues suspected   Stage 3a chronic kidney disease ([place]) Renal function at baseline   Adult hypothyroidism Continue levothyroxine   Essential (primary) hypertension BP controlled.  Continue home antihypertensives   Peripheral neuropathy, continued gabapentin 600 mg p.o. 3 times daily home dose   Body mass index is 22.66 kg/m.  Nutrition Problem: Increased nutrient needs Etiology: post-op healing Interventions: Interventions: Ensure Enlive (each supplement provides 350kcal and 20 grams of protein), MVI     Diet: N.p.o. since midnight for surgical intervention DVT Prophylaxis: SCD, pharmacological prophylaxis contraindicated due to surgical intervention, patient was on Eliquis    Advance goals of care discussion: DNR  Family Communication: family was present at bedside, at the time of interview.  The pt provided permission to discuss medical plan with the family. Opportunity was given to ask question and all questions were answered satisfactorily.   Disposition:  Pt is from Home, admitted with fall right femur fracture, s/p ORIF planned today, need to be seen by PT and OT, which precludes a safe discharge. Discharge to most likely SNF, TBD after PT and OT eval, when cleared by orthopedic surgery..  Subjective: Overnight patient had severe pain, currently patient resting comfortably.  patient is very hard of hearing, denies any active issues.   Physical Exam: General: NAD, lying comfortably Appear in no distress, affect appropriate Eyes: PERRLA ENT: Oral Mucosa Clear, moist  Neck: no JVD,  Cardiovascular: S1 and S2  Present, no Murmur,  Respiratory: good respiratory effort, Bilateral Air entry equal and Decreased, no  Crackles, no wheezes Abdomen: Bowel Sound present, Soft and no tenderness,  Skin: no rashes Extremities: no Pedal edema, no calf tenderness, right hip no visible bruise or hematoma Neurologic: without any new focal findings Gait not checked due to patient safety concerns  Vitals:   08/31/22 0324 08/31/22 0818 08/31/22 1121 08/31/22 1635  BP: (!) 157/72 (!) 148/78 (!) 155/86   Pulse: 87 88 (!) 106 (!) 27  Resp:  '16 16 13  '$ Temp: 98.4 F (36.9 C) 98.6 F (37 C) 97.9 F (36.6 C) (!) 97.5 F (36.4 C)  TempSrc:   Oral   SpO2: 99% 100% 99% 98%  Weight:   67.6 kg   Height:   '5\' 8"'$  (1.727 m)     Intake/Output Summary (Last 24 hours) at 08/31/2022 1645 Last data filed at 08/31/2022 1630 Gross per 24 hour  Intake 1342.49 ml  Output 1900 ml  Net -557.51 ml   Filed Weights   08/30/22 0656 08/31/22 1121  Weight: 67.6 kg 67.6 kg    Data Reviewed: I have personally reviewed and interpreted daily labs, tele strips, imagings as discussed above. I reviewed all nursing notes, pharmacy notes, vitals, pertinent old records I have discussed plan of care as described above with RN and patient/family.  CBC: Recent Labs  Lab 08/30/22 0238 08/31/22 0344  WBC 9.7 9.6  NEUTROABS 7.7  --   HGB 11.5* 10.7*  HCT 35.6* 32.2*  MCV 98.6 95.8  PLT 131* 951*   Basic Metabolic Panel: Recent Labs  Lab 08/30/22 0238 08/31/22 0344  NA 138 136  K 4.2 4.4  CL 103 98  CO2 25 29  GLUCOSE 217* 275*  BUN 24* 24*  CREATININE 0.95 1.12*  CALCIUM 9.0 9.1  MG  --  1.8  PHOS  --  4.0    Studies: DG FEMUR, MIN 2 VIEWS RIGHT  Result Date: 08/31/2022 CLINICAL DATA:  Surgery, elective EXAM: RIGHT FEMUR 2 VIEWS COMPARISON:  Femur CT 08/30/2022 FINDINGS: Intraoperative images during plate and cerclage wire fixation of the proximal femur for a periprosthetic fracture. Intact hardware. No evidence of immediate complication. Improved fracture alignment. IMPRESSION: Intraoperative images during plate and  cerclage wire fixation of the proximal femur for a periprosthetic fracture. Improved fracture alignment. No evidence of immediate complication. Electronically Signed   By: Maurine Simmering M.D.   On: 08/31/2022 16:24   DG C-Arm 1-60 Min-No Report  Result Date: 08/31/2022 Fluoroscopy was utilized by the requesting physician.  No radiographic interpretation.   ECHOCARDIOGRAM COMPLETE  Result Date: 08/31/2022    ECHOCARDIOGRAM REPORT   Patient Name:   Dawn Burnett Date of Exam: 08/31/2022 Medical Rec #:  [id number]    Height:       68.0 in Accession #:    [id number]   Weight:       149.0 lb Date of Birth:  08/19/1928    BSA:          1.804 m Patient Age:    6 years     BP:           157/72 mmHg Patient Gender: F            HR:           87 bpm. Exam Location:  [facility] Procedure: 2D Echo, Color Doppler and Cardiac Doppler Indications:     CHF-acute systolic F57.32  History:  Patient has prior history of Echocardiogram examinations, most                  recent 02/21/2019. CHF, Arrythmias:Atrial Fibrillation; Risk                  Factors:Hypertension.  Sonographer:     Sherrie Sport Referring Phys:  [id number] Val Riles Diagnosing Phys: Kathlyn Sacramento MD  Sonographer Comments: Image quality was good. IMPRESSIONS  1. Left ventricular ejection fraction, by estimation, is 25 to 30%. The left ventricle has severely decreased function. Left ventricular endocardial border not optimally defined to evaluate regional wall motion. There is moderate left ventricular hypertrophy. Left ventricular diastolic parameters are indeterminate.  2. Right ventricular systolic function is normal. The right ventricular size is normal. There is mildly elevated pulmonary artery systolic pressure.  3. Left atrial size was mildly dilated.  4. Right atrial size was mildly dilated.  5. Moderate pleural effusion in the left lateral region.  6. The mitral valve is abnormal. Moderate mitral valve regurgitation. No evidence of mitral stenosis.  Moderate mitral annular calcification.  7. The aortic valve is normal in structure. Aortic valve regurgitation is not visualized. Aortic valve sclerosis/calcification is present, without any evidence of aortic stenosis.  8. The inferior vena cava is normal in size with greater than 50% respiratory variability, suggesting right atrial pressure of 3 mmHg. FINDINGS  Left Ventricle: Left ventricular ejection fraction, by estimation, is 25 to 30%. The left ventricle has severely decreased function. Left ventricular endocardial border not optimally defined to evaluate regional wall motion. The left ventricular internal cavity size was normal in size. There is moderate left ventricular hypertrophy. Left ventricular diastolic parameters are indeterminate. Right Ventricle: The right ventricular size is normal. No increase in right ventricular wall thickness. Right ventricular systolic function is normal. There is mildly elevated pulmonary artery systolic pressure. The tricuspid regurgitant velocity is 3.16  m/s, and with an assumed right atrial pressure of 3 mmHg, the estimated right ventricular systolic pressure is 71.0 mmHg. Left Atrium: Left atrial size was mildly dilated. Right Atrium: Right atrial size was mildly dilated. Pericardium: There is no evidence of pericardial effusion. Mitral Valve: The mitral valve is abnormal. There is severe thickening of the mitral valve leaflet(s). There is moderate calcification of the mitral valve leaflet(s). Moderate mitral annular calcification. Moderate mitral valve regurgitation. No evidence  of mitral valve stenosis. Tricuspid Valve: The tricuspid valve is normal in structure. Tricuspid valve regurgitation is mild . No evidence of tricuspid stenosis. Aortic Valve: The aortic valve is normal in structure. Aortic valve regurgitation is not visualized. Aortic valve sclerosis/calcification is present, without any evidence of aortic stenosis. Aortic valve mean gradient measures 2.3  mmHg. Aortic valve peak  gradient measures 3.6 mmHg. Aortic valve area, by VTI measures 2.13 cm. Pulmonic Valve: The pulmonic valve was normal in structure. Pulmonic valve regurgitation is mild. No evidence of pulmonic stenosis. Aorta: The aortic root is normal in size and structure. Venous: The inferior vena cava is normal in size with greater than 50% respiratory variability, suggesting right atrial pressure of 3 mmHg. IAS/Shunts: No atrial level shunt detected by color flow Doppler. Additional Comments: There is a moderate pleural effusion in the left lateral region.  LEFT VENTRICLE PLAX 2D LVIDd:         4.10 cm LVIDs:         3.30 cm LV PW:         1.20 cm LV IVS:  1.60 cm LVOT diam:     2.00 cm LV SV:         32 LV SV Index:   18 LVOT Area:     3.14 cm  LV Volumes (MOD) LV vol d, MOD A2C: 77.5 ml LV vol d, MOD A4C: 109.0 ml LV vol s, MOD A2C: 62.9 ml LV vol s, MOD A4C: 80.2 ml LV SV MOD A2C:     14.6 ml LV SV MOD A4C:     109.0 ml LV SV MOD BP:      12.2 ml RIGHT VENTRICLE RV Basal diam:  2.60 cm RV Mid diam:    2.00 cm RV S prime:     11.90 cm/s TAPSE (M-mode): 1.7 cm LEFT ATRIUM            Index        RIGHT ATRIUM           Index LA diam:      3.70 cm  2.05 cm/m   RA Area:     21.10 cm LA Vol (A2C): 101.0 ml 56.00 ml/m  RA Volume:   62.50 ml  34.65 ml/m LA Vol (A4C): 33.8 ml  18.74 ml/m  AORTIC VALVE AV Area (Vmax):    2.19 cm AV Area (Vmean):   2.12 cm AV Area (VTI):     2.13 cm AV Vmax:           95.10 cm/s AV Vmean:          64.933 cm/s AV VTI:            0.149 m AV Peak Grad:      3.6 mmHg AV Mean Grad:      2.3 mmHg LVOT Vmax:         66.30 cm/s LVOT Vmean:        43.800 cm/s LVOT VTI:          0.101 m LVOT/AV VTI ratio: 0.68  AORTA Ao Root diam: 2.87 cm MITRAL VALVE                TRICUSPID VALVE MV Area (PHT): 4.89 cm     TR Peak grad:   39.9 mmHg MV Decel Time: 155 msec     TR Vmax:        316.00 cm/s MV E velocity: 121.00 cm/s                             SHUNTS                              Systemic VTI:  0.10 m                             Systemic Diam: 2.00 cm Kathlyn Sacramento MD Electronically signed by Kathlyn Sacramento MD Signature Date/Time: 08/31/2022/11:13:32 AM    Final     Scheduled Meds:  [MAR Hold] atorvastatin  20 mg Oral QHS   [MAR Hold] digoxin  0.125 mg Oral QODAY   feeding supplement  237 mL Oral BID BM   [MAR Hold] furosemide  20 mg Oral Daily   [MAR Hold] insulin aspart  0-15 Units Subcutaneous TID WC   [MAR Hold] insulin aspart  0-5 Units Subcutaneous QHS   insulin aspart  4 Units Subcutaneous Once   [MAR Hold] levothyroxine  50  mcg Oral Q0600   [MAR Hold] metoprolol succinate  100 mg Oral Daily   [START ON 09/01/2022] multivitamin with minerals  1 tablet Oral Daily   [MAR Hold] sacubitril-valsartan  1 tablet Oral BID   sodium chloride (PF)       [MAR Hold] spironolactone  25 mg Oral Daily   Continuous Infusions:  sodium chloride Stopped (08/31/22 1630)   acetaminophen     PRN Meds: acetaminophen, [MAR Hold] acetaminophen, fentaNYL (SUBLIMAZE) injection, [MAR Hold] HYDROcodone-acetaminophen, [MAR Hold]  morphine injection, sodium chloride (PF), traMADol  Time spent: 50 minutes  Author: Val Riles. MD [facility] 08/31/2022 4:45 PM  To reach On-call, see care teams to locate the attending and reach out to them via [email or web address]. If 7PM-7AM, please contact night-coverage If you still have difficulty reaching the attending provider, please page the [facility] (Director on Call) for [facility] on amion for assistance.

## 2022-08-31 NOTE — Op Note (Signed)
Patient Name: Dawn Burnett  PPI:[id number]  Pre-Operative Diagnosis: Right periprosthetic proximal femur fracture  Post-Operative Diagnosis: (same)  Procedure: Open reduction internal fixation right periprosthetic femur fracture  Components/Implants: Zimmer NCB 4.95m right proximal femur locking plate w/ narrow proximal femur trochanteric extension plate, x3 1.866mcerclage wire w/ x1 polyaxial cable button; 5.65m33mnd 4.65mm28mrews   Date of Surgery: 08/31/2022  Surgeon: ZachSteffanie Rainwater Assistant: ThomDorise Hiss(present and scrubbed throughout the case, critical for assistance with exposure, retraction, instrumentation, and closure), NataLanelle Bal   Anesthesiologist: JohnWynetta Emeryesthesia: General   EBL: 250 063F: 850 016mplications: None   Brief history: The patient is a 89 y19r old female [facility] and found to have a right periprosthetic proximal femur fracture around a cemented right hip hemiarthroplasty.  The patient was admitted by the medical team and optimized for surgery.  A thorough discussion was had with the patient and family about the risks and benefits of surgical intervention for their hip fracture as definitive treatment.  The patient and family opted to proceed with the operation.  All preoperative films were reviewed and an appropriate surgical plan was made prior to surgery.   Description of procedure: The patient was brought to the operating room where laterality was confirmed by all those present to be the right side.  The patient was administered anesthesia on a stretcher prior to being moved supine on the operating room table and then placed left lateral decubitus with all bony prominences well-padded and a lateral hip positioner placed. Patient was given an intravenous dose of antibiotics for surgical prophylaxis and TXA.  A safety strap was also placed.  Surgical site was prepped with  alcohol and chlorhexidine. The surgical site over the hip was and draped in typical sterile fashion with multiple layers of adhesive and nonadhesive drapes.  The incision site was marked out with a sterile marker under fluoroscopic guidance incorporating the distal half of her previous hip incision.   A surgical timeout was then called with participation of all staff in the room the patient was then a confirmed again and laterality confirmed.   The incision was carried down through the skin and subcutaneous tissues exposing the glut max fascia.  The fascia was incised in line with the femur from the tip of the greater trochanter down the femur to the point previously marked as the fracture site on fluoroscopic imaging.  All bleeding vessels were coagulated.  The vastus lateralis fascia was then opened starting at the bare area proximally and extending distally to the level of the fracture.  At this time the lateral spike of the fracture was shown to be penetrated through the vastus lateralis muscle.  Some gentle traction was applied to the leg to help reduce the fracture prior to continuing with exposure.  The vastus lateralis muscle was elevated off of its fascia down to the lateral intermuscular septum and all perforators were coagulated.  A Cobb elevator and key were used to gently elevate and expose the lateral aspect of the femur with care taken not to fully devitalized the bone and leave as much soft tissue attachment as possible.  The fracture site was exposed and the edges of the fractures were gently cleaned.  The previous cemented hemiarthroplasty stem was visible protruding from the distal aspect of the fracture.  A pair of pliers was then used to test the stability of the component and the  component was found to be well-fixed to the proximal fracture fragment without any motion at the cement bone interface and stable to push pull and rotational testing.  The area was gently irrigated attempting to  preserve some hematoma.  The distal fracture fragments were exposed and the previous cement plug was found to be broken into several pieces of plastic which were removed.  The fracture was then reduced with lobster-claw clamps and confirmed reduction on both visual inspection and fluoroscopic imaging.  2 cerclage wires were then carefully placed circumferentially around the femur with care taken to ensure the tip of the cerclage passer remain on bone at all times.  These 2 wires were then temporarily tensioned and shown to give good stability to the fracture without any motion.  A 12 hole proximal femur plate was then temporarily applied to the bone and shown to have good fit and coverage on fluoroscopic imaging.  The proximal femur plate was then fixed to the bone utilizing a proximal cerclage wire through a button on the plate and distally held temporarily in place midshaft with a drill bit through the guide.  I was happy with the position of the plate on fluoroscopic imaging and proceeded to utilize cortical screws distally applied through a minimally invasive percutaneous technique to bring the plate close to the bone with good bite and hold on the distal screw cluster.  Proximal screws were placed in the greater trochanteric extension plate and around the femoral component proximally.  All of the proximal screws were locked in place with the 5 larger screws locked with locking screw caps.  The hip was then examined for stability under fluoroscopic examination and found to have good stability with concentric reduction of the fracture.  The femur was then irrigated with Irrisept solution and pulsatile lavage.  The vastus lateralis muscle fascia was closed with 0 Vicryl.  The glut max and IT band were closed with a #2 barbed running suture and several figure-of-eight  #1 Vicryl sutures.  Subcutaneous tissues were then irrigated again and the subcutaneous tissues were closed with a 0 running barbed suture and  interrupted 2-0 Vicryl sutures.  Subcutaneous tissues were injected with Marcaine and the skin was closed with 3-0 monoderm quill and Dermabond. Sterile dressings were applied to the incisions.   The patient was awoken from anesthesia transferred off of the operating room table onto a hospital bed.  The patient had a good pulse postoperatively in the foot . the patient was then transferred to the PACU in stable condition.

## 2022-08-31 NOTE — Anesthesia Procedure Notes (Signed)
Arterial Line Insertion Start/End1/22/2024 2:31 PM, 08/31/2022 2:31 PM Performed by: Iran Ouch, MD, anesthesiologist  Patient location: Pre-op. Preanesthetic checklist: patient identified, IV checked, site marked, risks and benefits discussed, surgical consent, monitors and equipment checked, pre-op evaluation, timeout performed and anesthesia consent Lidocaine 1% used for infiltration Left, radial was placed Catheter size: 20 G Hand hygiene performed , maximum sterile barriers used  and Seldinger technique used  Attempts: 1 Procedure performed without using ultrasound guided technique. Following insertion, dressing applied. Post procedure assessment: normal and unchanged  Patient tolerated the procedure well with no immediate complications. Additional procedure comments: Student CRNA performed under supervision.

## 2022-09-01 ENCOUNTER — Inpatient Hospital Stay: Payer: Medicare PPO

## 2022-09-01 ENCOUNTER — Encounter: Payer: Self-pay | Admitting: Orthopedic Surgery

## 2022-09-01 DIAGNOSIS — I4891 Unspecified atrial fibrillation: Secondary | ICD-10-CM

## 2022-09-01 DIAGNOSIS — M9701XA Periprosthetic fracture around internal prosthetic right hip joint, initial encounter: Secondary | ICD-10-CM | POA: Diagnosis not present

## 2022-09-01 LAB — CBC
HCT: 29.2 % — ABNORMAL LOW (ref 36.0–46.0)
Hemoglobin: 9.6 g/dL — ABNORMAL LOW (ref 12.0–15.0)
MCH: 31.8 pg (ref 26.0–34.0)
MCHC: 32.9 g/dL (ref 30.0–36.0)
MCV: 96.7 fL (ref 80.0–100.0)
Platelets: 113 10*3/uL — ABNORMAL LOW (ref 150–400)
RBC: 3.02 MIL/uL — ABNORMAL LOW (ref 3.87–5.11)
RDW: 12.9 % (ref 11.5–15.5)
WBC: 16.3 10*3/uL — ABNORMAL HIGH (ref 4.0–10.5)
nRBC: 0 % (ref 0.0–0.2)

## 2022-09-01 LAB — COMPREHENSIVE METABOLIC PANEL
ALT: 12 U/L (ref 0–44)
AST: 38 U/L (ref 15–41)
Albumin: 3.4 g/dL — ABNORMAL LOW (ref 3.5–5.0)
Alkaline Phosphatase: 45 U/L (ref 38–126)
Anion gap: 13 (ref 5–15)
BUN: 21 mg/dL (ref 8–23)
CO2: 23 mmol/L (ref 22–32)
Calcium: 8.6 mg/dL — ABNORMAL LOW (ref 8.9–10.3)
Chloride: 100 mmol/L (ref 98–111)
Creatinine, Ser: 0.99 mg/dL (ref 0.44–1.00)
GFR, Estimated: 54 mL/min — ABNORMAL LOW (ref >60)
Glucose, Bld: 280 mg/dL — ABNORMAL HIGH (ref 70–99)
Potassium: 4.5 mmol/L (ref 3.5–5.1)
Sodium: 136 mmol/L (ref 135–145)
Total Bilirubin: 0.9 mg/dL (ref 0.3–1.2)
Total Protein: 6.2 g/dL — ABNORMAL LOW (ref 6.5–8.1)

## 2022-09-01 LAB — PROCALCITONIN: Procalcitonin: 0.22 ng/mL

## 2022-09-01 LAB — GLUCOSE, CAPILLARY
Glucose-Capillary: 260 mg/dL — ABNORMAL HIGH (ref 70–99)
Glucose-Capillary: 226 mg/dL — ABNORMAL HIGH (ref 70–99)
Glucose-Capillary: 189 mg/dL — ABNORMAL HIGH (ref 70–99)
Glucose-Capillary: 253 mg/dL — ABNORMAL HIGH (ref 70–99)

## 2022-09-01 LAB — TSH: TSH: 1.711 u[IU]/mL (ref 0.350–4.500)

## 2022-09-01 LAB — TROPONIN I (HIGH SENSITIVITY)
Troponin I (High Sensitivity): 1667 ng/L (ref <18)
Troponin I (High Sensitivity): 1544 ng/L (ref <18)

## 2022-09-01 LAB — BRAIN NATRIURETIC PEPTIDE: B Natriuretic Peptide: 850.7 pg/mL — ABNORMAL HIGH (ref 0.0–100.0)

## 2022-09-01 LAB — PHOSPHORUS: Phosphorus: 3.8 mg/dL (ref 2.5–4.6)

## 2022-09-01 LAB — MAGNESIUM: Magnesium: 1.8 mg/dL (ref 1.7–2.4)

## 2022-09-01 MED ORDER — MAGNESIUM SULFATE 2 GM/50ML IV SOLN
2.0000 g | Freq: Once | INTRAVENOUS | Status: AC
  Administered 2022-09-01: 2 g via INTRAVENOUS
  Filled 2022-09-01: qty 50

## 2022-09-01 MED ORDER — DILTIAZEM HCL 30 MG PO TABS
30.0000 mg | ORAL_TABLET | Freq: Four times a day (QID) | ORAL | Status: DC
  Administered 2022-09-01 – 2022-09-03 (×7): 30 mg via ORAL
  Filled 2022-09-01 (×8): qty 1

## 2022-09-01 MED ORDER — ACETAMINOPHEN 325 MG PO TABS
325.0000 mg | ORAL_TABLET | Freq: Four times a day (QID) | ORAL | Status: DC | PRN
  Administered 2022-09-01 – 2022-09-04 (×5): 650 mg via ORAL
  Filled 2022-09-01 (×5): qty 2

## 2022-09-01 MED ORDER — DIGOXIN 125 MCG PO TABS
0.1250 mg | ORAL_TABLET | Freq: Every day | ORAL | Status: DC
  Administered 2022-09-02 – 2022-09-07 (×6): 0.125 mg via ORAL
  Filled 2022-09-01 (×6): qty 1

## 2022-09-01 MED ORDER — DIGOXIN 125 MCG PO TABS
0.1250 mg | ORAL_TABLET | Freq: Once | ORAL | Status: AC
  Administered 2022-09-01: 0.125 mg via ORAL
  Filled 2022-09-01: qty 1

## 2022-09-01 MED ORDER — POLYVINYL ALCOHOL 1.4 % OP SOLN
1.0000 [drp] | OPHTHALMIC | Status: DC | PRN
  Administered 2022-09-03 – 2022-09-05 (×2): 1 [drp] via OPHTHALMIC
  Filled 2022-09-01: qty 15

## 2022-09-01 NOTE — Progress Notes (Signed)
SUBJECTIVE: Patient is a 89 y.o. female with medical history significant for Atrial fibrillation on Eliquis, HTN, history of gastrointestinal stromal tumor, CKD 3a, systolic CHF (EF 84% 08/3242), hypothyroidism and non-insulin-dependent type 2 diabetes who presented to the ED on 08/30/22 following an accidental fall after she slipped when she got up to go to the bathroom, landing on her right hip.   Patient found to have a right hip periprosthetic proximal femur fracture around a cemented hemiarthroplasty. Patient underwent open reduction internal fixation right periprosthetic femur fracture.   Overnight, patient became agitated, received 2 mg Haldol. Patient had a run of [place] at 0200 with complaints of chest pain. Patient received morphine and Mg. Cardizem was increased to 10 mg/hr. Troponin levels [id number].   Vitals:   09/01/22 0500 09/01/22 0700 09/01/22 0800 09/01/22 0829  BP: (!) 166/62 131/89 (!) 153/64 (!) 141/69  Pulse:    92  Resp: '17 17 17 '$ (!) 24  Temp:    98.5 F (36.9 C)  TempSrc:    Oral  SpO2: 99%   100%  Weight:      Height:        Intake/Output Summary (Last 24 hours) at 09/01/2022 0850 Last data filed at 09/01/2022 0102 Gross per 24 hour  Intake 1937.52 ml  Output 1100 ml  Net 837.52 ml    LABS: Basic Metabolic Panel: Recent Labs    08/31/22 0344 09/01/22 0243  NA 136 136  K 4.4 4.5  CL 98 100  CO2 29 23  GLUCOSE 275* 280*  BUN 24* 21  CREATININE 1.12* 0.99  CALCIUM 9.1 8.6*  MG 1.8 1.8  PHOS 4.0 3.8   Liver Function Tests: Recent Labs    09/01/22 0243  AST 38  ALT 12  ALKPHOS 45  BILITOT 0.9  PROT 6.2*  ALBUMIN 3.4*   No results for input(s): "LIPASE", "AMYLASE" in the last 72 hours. CBC: Recent Labs    08/30/22 0238 08/31/22 0344 09/01/22 0243  WBC 9.7 9.6 16.3*  NEUTROABS 7.7  --   --   HGB 11.5* 10.7* 9.6*  HCT 35.6* 32.2* 29.2*  MCV 98.6 95.8 96.7  PLT 131* 145* 113*   Cardiac Enzymes: No results for input(s):  "CKTOTAL", "CKMB", "CKMBINDEX", "TROPONINI" in the last 72 hours. BNP: Invalid input(s): "POCBNP" D-Dimer: No results for input(s): "DDIMER" in the last 72 hours. Hemoglobin A1C: No results for input(s): "HGBA1C" in the last 72 hours. Fasting Lipid Panel: No results for input(s): "CHOL", "HDL", "LDLCALC", "TRIG", "CHOLHDL", "LDLDIRECT" in the last 72 hours. Thyroid Function Tests: Recent Labs    09/01/22 0243  TSH 1.711   Anemia Panel: Recent Labs    08/30/22 0238 08/30/22 0949  VITAMINB12  --  614  FOLATE >40.0  --   TIBC 370  --   IRON 39  --      PHYSICAL EXAM General: Well developed, well nourished, in no acute distress HEENT:  Normocephalic and atramatic Neck:  No JVD.  Lungs: Clear bilaterally to auscultation and percussion. Heart: HRRR . Normal S1 and S2 without gallops or murmurs.  Abdomen: Bowel sounds are positive, abdomen soft and non-tender  Msk:  Back normal, normal gait. Normal strength and tone for age. Extremities: No clubbing, cyanosis or edema.   Neuro: Alert and oriented X 3. Psych:  Good affect, responds appropriately  TELEMETRY: atrial fibrillation, HR 90s-110 bpm  ASSESSMENT AND PLAN: Patient resting in bed. Patient confused but follows commands. Denies chest pain. Increase digoxin to  daily dosing. Weaning patient off of Cardizem due to contraindication in decreased LVEF, 25-30% on 08/31/22 echo. Will continue to follow.   Principal Problem:   Periprosthetic fracture around internal prosthetic right hip joint ([place]) Active Problems:   Atrial fibrillation ([place])   Essential (primary) hypertension   Adult hypothyroidism   Uncontrolled type 2 diabetes mellitus with hyperglycemia, without long-term current use of insulin (HCC)   Stage 3a chronic kidney disease (HCC)   Chronic anticoagulation   History of malignant gastrointestinal stromal tumor (GIST)   Chronic systolic CHF (congestive heart failure) ([place])    Aysen Shieh,  FNP-C 09/01/2022 8:50 AM

## 2022-09-01 NOTE — Progress Notes (Addendum)
Subjective: 1 Day Post-Op Procedure(s) (LRB): OPEN REDUCTION INTERNAL FIXATION (ORIF) PERIPROSTHETIC FEMORAL SHAFT FRACTURE (Right) Patient reports pain as mild.   Patient had some overnight agitation and found to have some EKG changes and elevated troponins treated with haldol and started on cardizem drip by cardiology and moved to telemetry. Troponins trended down on second test, cardiology on board.   Plan is to go Skilled nursing facility after hospital stay.  Objective: Vital signs in last 24 hours: Temp:  [97.5 F (36.4 C)-98.6 F (37 C)] 98.6 F (37 C) (01/23 0437) Pulse Rate:  [27-146] 64 (01/23 0400) Resp:  [13-27] 17 (01/23 0800) BP: (92-180)/(45-105) 153/64 (01/23 0800) SpO2:  [92 %-100 %] 99 % (01/23 0500) Arterial Line BP: (115-144)/(53-70) 116/53 (01/22 1745) Weight:  [67.6 kg] 67.6 kg (01/22 1121)  Intake/Output from previous day: 01/22 0701 - 01/23 0700 In: 1707.8 [I.V.:1457.8; IV Piggyback:250] Out: 1100 [Urine:850; Blood:250] Intake/Output this shift: Total I/O In: 229.8 [I.V.:229.8] Out: -   Recent Labs    08/30/22 0238 08/31/22 0344 09/01/22 0243  HGB 11.5* 10.7* 9.6*   Recent Labs    08/31/22 0344 09/01/22 0243  WBC 9.6 16.3*  RBC 3.36* 3.02*  HCT 32.2* 29.2*  PLT 145* 113*   Recent Labs    08/31/22 0344 09/01/22 0243  NA 136 136  K 4.4 4.5  CL 98 100  CO2 29 23  BUN 24* 21  CREATININE 1.12* 0.99  GLUCOSE 275* 280*  CALCIUM 9.1 8.6*   Recent Labs    08/30/22 0238  INR 1.2    EXAM General - Patient is Alert and Appropriate Extremity - Neurovascular intact Sensation intact distally Intact pulses distally Dorsiflexion/Plantar flexion intact No cellulitis present Compartment soft Dressing - dressing C/D/I and no drainage Motor Function - intact, moving foot and toes well on exam.   Past Medical History:  Diagnosis Date   Arthritis    Atrial fibrillation (HCC)    CHF (congestive heart failure) (HCC)    Diabetes  mellitus without complication (HCC)    GERD (gastroesophageal reflux disease)    Hiatal hernia    Hypertension    Neuropathy    Skin cancer, basal cell    Thyroid disease     Assessment/Plan:   1 Day Post-Op Procedure(s) (LRB): OPEN REDUCTION INTERNAL FIXATION (ORIF) PERIPROSTHETIC FEMORAL SHAFT FRACTURE (Right) Principal Problem:   Periprosthetic fracture around internal prosthetic right hip joint (HCC) Active Problems:   Atrial fibrillation (HCC)   Essential (primary) hypertension   Adult hypothyroidism   Uncontrolled type 2 diabetes mellitus with hyperglycemia, without long-term current use of insulin (HCC)   Stage 3a chronic kidney disease (HCC)   Chronic anticoagulation   History of malignant gastrointestinal stromal tumor (GIST)   Chronic systolic CHF (congestive heart failure) (HCC)  Estimated body mass index is 22.66 kg/m as calculated from the following:   Height as of this encounter: '5\' 8"'$  (1.727 m).   Weight as of this encounter: 67.6 kg. Advance diet Up with therapy Pain controlled Hgb stable - continue to monitor  NSTEMI - ok per ortho to start heparin if needed. Will monitor thigh for swelling. If needed, will apply compressive wrap to thigh. VSS   DVT Prophylaxis - TED hose and SCDs Eliquis Non weight bearing Right lower extremity   T. Rachelle Hora, PA-C [place] 09/01/2022, 8:28 AM   Patient seen and examined, agree with above plan.  The right leg looks good s/p right periprostethic hip ORIF.  Pain  is controlled. Family at bedside and discussed care. Further medical and cardiac care per primary team and cardiology. All questions answered and will continue to follow.   Steffanie Rainwater MD

## 2022-09-01 NOTE — Anesthesia Postprocedure Evaluation (Signed)
Anesthesia Post Note  Patient: [name]  Procedure(s) Performed: OPEN REDUCTION INTERNAL FIXATION (ORIF) PERIPROSTHETIC FEMORAL SHAFT FRACTURE (Right: Hip)  Patient location during evaluation: PACU Anesthesia Type: General Level of consciousness: awake and alert Pain management: pain level controlled Vital Signs Assessment: post-procedure vital signs reviewed and stable Respiratory status: spontaneous breathing, nonlabored ventilation and respiratory function stable Cardiovascular status: blood pressure returned to baseline and stable Postop Assessment: no apparent nausea or vomiting Anesthetic complications: no   No notable events documented.   Last Vitals:  Vitals:   09/01/22 0437 09/01/22 0500  BP:  (!) 166/62  Pulse:    Resp:  17  Temp: 37 C   SpO2: 96% 99%    Last Pain:  Vitals:   09/01/22 0255  TempSrc:   PainSc: Asleep                 [place] Ouch

## 2022-09-01 NOTE — Progress Notes (Addendum)
Page to K. Foust NP re: vfib/vtach, strips saved. No s/s nor c/o cardiac nor respiratory distress. No pain/sob.

## 2022-09-01 NOTE — Progress Notes (Signed)
PT Cancellation Note  Patient Details Name: Dawn Burnett MRN: [id number] DOB: 07/31/1929   Cancelled Treatment:    Reason Eval/Treat Not Completed: Patient not medically ready.  PT consult received.  Chart reviewed.  Nurse requesting to hold PT at this time (working on switching pt off Cardizem drip to p.o. medications and wanting to make sure pt stable before initiating activity).  Will re-attempt PT evaluation at a later date/time.  Leitha Bleak, PT 09/01/22, 11:56 AM

## 2022-09-01 NOTE — Progress Notes (Signed)
   09/01/22 0326  Provider Notification  Provider Name/Title Foust NP  Date Provider Notified 09/01/22  Time Provider Notified 0327  Method of Notification Page  Notification Reason Critical Result  Test performed and critical result Troponin 1667  Date Critical Result Received 09/01/22  Time Critical Result Received 0326

## 2022-09-01 NOTE — Progress Notes (Signed)
[facility] Progress Note  Patient: Dawn Burnett    ZYS:[id number]  DOA: 08/30/2022     Date of Service: the patient was seen and examined on 09/01/2022  Chief Complaint  Patient presents with   Hip Pain   Fall   Brief hospital course:  CRYSTIN LECHTENBERG is a 89 y.o. female with medical history significant for Atrial fibrillation on Eliquis, HTN, history of gastrointestinal stromal tumor, CKD 3a, systolic CHF (EF 93% 09/3555), hypothyroidism and non-insulin-dependent type 2 diabetes who presents to the ED following an accidental fall after she slipped when she got up to go to the bathroom, landing on her right hip.  She denies hitting anywhere else and experiences pain only in her right hip.  History is provided by daughter at bedside as patient is very hard of hearing. ED course and data review: BP 176/77 with otherwise normal vitals.  Labs notable for glucose 217, hemoglobin 11.5EKG, personally viewed and interpreted showing A-fib at 92 with LBBB.  X-ray showing periprosthetic right proximal femur fracture.  Chest x-ray was clear.  The ED provider spoke with orthopedist, Dr. Karel Jarvis who will take patient to the OR following a 24-hour Eliquis washout.  Hospitalist consulted for admission.     Assessment and Plan:  # Periprosthetic fracture around internal prosthetic right hip joint ([place]) Resumed Eliquis on 1/23 Orthopedic surgery was consulted, plan is for ORIF today Cardiology was consulted for preop clearance, patient remains at moderate to high risk.  Due to comorbidities Continue as needed medication for pain control Continue fall precautions Follow PT OT eval after ORIF   A-fib with RVR and torsade developed after surgery on 1/22 H/o Atrial fibrillation (HCC) Chronic anticoagulation, on 1/23 resumed Eliquis, cleared by ortho Continue metoprolol 100 mg POD home dose S/p IV Lopressor and IV Cardizem given, started on Cardizem IV infusion Magnesium IV 1 g followed by 2 g given due to  torsade, cardiology is aware Digoxin was given in the OR, patient was started on digoxin 1.25 mg p.o. daily Started Cardizem IR 30 mg p.o. every 6 hourly with holding parameters    Chronic systolic CHF (congestive heart failure) ([place]) Clinically euvolemic, last EF on record 36% in July 2020 Continue digoxin, furosemide, metoprolol, Entresto and spironolactone 08/31/22 TTE shows LVEF 25 to 30%, severely decreased LV function, moderate LV hypertrophy.  Moderate MR Clinically no signs of volume overload    Uncontrolled type 2 diabetes mellitus with hyperglycemia, without long-term current use of insulin (HCC) Sliding scale insulin coverage   History of malignant gastrointestinal stromal tumor (GIST) No acute issues suspected   Stage 3a chronic kidney disease ([place]) Renal function at baseline   Adult hypothyroidism Continue levothyroxine   Essential (primary) hypertension BP controlled.  Continue home antihypertensives   Peripheral neuropathy, continued gabapentin 600 mg p.o. 3 times daily home dose  Bilateral sensorineural hearing loss, very hard of hearing.  Body mass index is 22.66 kg/m.  Nutrition Problem: Increased nutrient needs Etiology: post-op healing Interventions: Interventions: Ensure Enlive (each supplement provides 350kcal and 20 grams of protein), MVI     Diet: Regular diet DVT Prophylaxis: Eliquis  Advance goals of care discussion: DNR  Family Communication: family was present at bedside, at the time of interview.  The pt provided permission to discuss medical plan with the family. Opportunity was given to ask question and all questions were answered satisfactorily.   Disposition:  Pt is from Home, admitted with fall right femur fracture, s/p ORIF done on 1/22,  need to be seen by PT and OT, which precludes a safe discharge. Discharge to most likely SNF, TBD after PT and OT eval, when cleared by cardiology    Subjective: Yesterday after surgery patient  developed A-fib with RVR, and also had an episode of torsade which was managed as above.  Currently patient is resting comfortably, without any acute distress.  Patient has deafness, hard of hearing, denies any complaints.  Family was at the bedside.  Discussed management plan.  Physical Exam: General: NAD, lying comfortably Appear in no distress, affect appropriate Eyes: PERRLA ENT: Oral Mucosa Clear, moist  Neck: no JVD,  Cardiovascular: Irregular rhythm,  no Murmur,  Respiratory: good respiratory effort, Bilateral Air entry equal and Decreased, no Crackles, no wheezes Abdomen: Bowel Sound present, Soft and no tenderness,  Skin: no rashes Extremities: no Pedal edema, no calf tenderness, s/p right hip ORIF, dressing CDI Neurologic: without any new focal findings Gait not checked due to patient safety concerns  Vitals:   09/01/22 1300 09/01/22 1400 09/01/22 1417 09/01/22 1500  BP: 137/61 (!) 140/57  (!) 127/58  Pulse:      Resp: 19 (!) 22 (!) 24 15  Temp:      TempSrc:      SpO2:      Weight:      Height:        Intake/Output Summary (Last 24 hours) at 09/01/2022 1540 Last data filed at 09/01/2022 1401 Gross per 24 hour  Intake 2433.67 ml  Output 1300 ml  Net 1133.67 ml   Filed Weights   08/30/22 0656 08/31/22 1121  Weight: 67.6 kg 67.6 kg    Data Reviewed: I have personally reviewed and interpreted daily labs, tele strips, imagings as discussed above. I reviewed all nursing notes, pharmacy notes, vitals, pertinent old records I have discussed plan of care as described above with RN and patient/family.  CBC: Recent Labs  Lab 08/30/22 0238 08/31/22 0344 09/01/22 0243  WBC 9.7 9.6 16.3*  NEUTROABS 7.7  --   --   HGB 11.5* 10.7* 9.6*  HCT 35.6* 32.2* 29.2*  MCV 98.6 95.8 96.7  PLT 131* 145* 540*   Basic Metabolic Panel: Recent Labs  Lab 08/30/22 0238 08/31/22 0344 09/01/22 0243  NA 138 136 136  K 4.2 4.4 4.5  CL 103 98 100  CO2 '25 29 23  '$ GLUCOSE 217*  275* 280*  BUN 24* 24* 21  CREATININE 0.95 1.12* 0.99  CALCIUM 9.0 9.1 8.6*  MG  --  1.8 1.8  PHOS  --  4.0 3.8    Studies: DG FEMUR, MIN 2 VIEWS RIGHT  Result Date: 09/01/2022 CLINICAL DATA:  Femur fracture.  Status post surgery. EXAM: RIGHT FEMUR 2 VIEWS COMPARISON:  Prior studies FINDINGS: Interval placement of LATERAL plate with numerous cortical screws and cerclage wires traversing the proximal aspect of the femur. Prior hip replacement. Visualized portion of the pelvis is intact. Remote knee arthroplasty. IMPRESSION: Status post ORIF of proximal femur fracture. Electronically Signed   By: Nolon Nations M.D.   On: 09/01/2022 14:49   DG Chest Port 1 View  Result Date: 09/01/2022 CLINICAL DATA:  Pleural effusion. EXAM: PORTABLE CHEST 1 VIEW COMPARISON:  08/30/2022 FINDINGS: Low lung volumes accounting for known hyperinflation. Heart size is normal. There are minimal patchy opacities at the lung bases, partially obscuring the hemidiaphragms. Small bilateral pleural effusions are present. Thickening of interstitial lines suggest mild pulmonary edema. There is no pneumothorax. IMPRESSION: 1. Small bilateral pleural effusions.  2. Mild pulmonary edema. 3. Minimal patchy opacities at the lung bases, likely atelectasis. Electronically Signed   By: Nolon Nations M.D.   On: 09/01/2022 10:06    Scheduled Meds:  acetaminophen  1,000 mg Oral Q12H   apixaban  2.5 mg Oral BID   atorvastatin  20 mg Oral QHS   [START ON 09/02/2022] digoxin  0.125 mg Oral Daily   diltiazem  30 mg Oral Q6H   docusate sodium  100 mg Oral BID   feeding supplement  237 mL Oral BID BM   furosemide  20 mg Oral Daily   gabapentin  600 mg Oral TID WC   imipramine  25 mg Oral QHS   insulin aspart  0-15 Units Subcutaneous TID WC   insulin aspart  0-5 Units Subcutaneous QHS   levothyroxine  50 mcg Oral Q0600   metoprolol succinate  100 mg Oral Daily   mirabegron ER  50 mg Oral QHS   multivitamin with minerals  1 tablet  Oral Daily   pantoprazole  40 mg Oral Daily   sacubitril-valsartan  1 tablet Oral BID   spironolactone  25 mg Oral Daily   Continuous Infusions:  diltiazem (CARDIZEM) infusion Stopped (09/01/22 1149)   PRN Meds: acetaminophen, HYDROcodone-acetaminophen, menthol-cetylpyridinium **OR** phenol, metoCLOPramide **OR** metoCLOPramide (REGLAN) injection, morphine injection, ondansetron **OR** ondansetron (ZOFRAN) IV, polyvinyl alcohol, traMADol  Time spent: 50 minutes  Author: Val Riles. MD [facility] 09/01/2022 3:40 PM  To reach On-call, see care teams to locate the attending and reach out to them via [email or web address]. If 7PM-7AM, please contact night-coverage If you still have difficulty reaching the attending provider, please page the [facility] (Director on Call) for [facility] on amion for assistance.

## 2022-09-02 DIAGNOSIS — M9701XA Periprosthetic fracture around internal prosthetic right hip joint, initial encounter: Secondary | ICD-10-CM | POA: Diagnosis not present

## 2022-09-02 LAB — CBC
HCT: 25.3 % — ABNORMAL LOW (ref 36.0–46.0)
Hemoglobin: 8.2 g/dL — ABNORMAL LOW (ref 12.0–15.0)
MCH: 31.9 pg (ref 26.0–34.0)
MCHC: 32.4 g/dL (ref 30.0–36.0)
MCV: 98.4 fL (ref 80.0–100.0)
Platelets: 132 10*3/uL — ABNORMAL LOW (ref 150–400)
RBC: 2.57 MIL/uL — ABNORMAL LOW (ref 3.87–5.11)
RDW: 13.2 % (ref 11.5–15.5)
WBC: 15 10*3/uL — ABNORMAL HIGH (ref 4.0–10.5)
nRBC: 0 % (ref 0.0–0.2)

## 2022-09-02 LAB — BASIC METABOLIC PANEL
Anion gap: 8 (ref 5–15)
BUN: 24 mg/dL — ABNORMAL HIGH (ref 8–23)
CO2: 27 mmol/L (ref 22–32)
Calcium: 8.7 mg/dL — ABNORMAL LOW (ref 8.9–10.3)
Chloride: 99 mmol/L (ref 98–111)
Creatinine, Ser: 1.08 mg/dL — ABNORMAL HIGH (ref 0.44–1.00)
GFR, Estimated: 48 mL/min — ABNORMAL LOW (ref >60)
Glucose, Bld: 195 mg/dL — ABNORMAL HIGH (ref 70–99)
Potassium: 4.7 mmol/L (ref 3.5–5.1)
Sodium: 134 mmol/L — ABNORMAL LOW (ref 135–145)

## 2022-09-02 LAB — GLUCOSE, CAPILLARY
Glucose-Capillary: 190 mg/dL — ABNORMAL HIGH (ref 70–99)
Glucose-Capillary: 238 mg/dL — ABNORMAL HIGH (ref 70–99)
Glucose-Capillary: 235 mg/dL — ABNORMAL HIGH (ref 70–99)
Glucose-Capillary: 264 mg/dL — ABNORMAL HIGH (ref 70–99)

## 2022-09-02 LAB — MAGNESIUM: Magnesium: 2.5 mg/dL — ABNORMAL HIGH (ref 1.7–2.4)

## 2022-09-02 LAB — PHOSPHORUS: Phosphorus: 3.4 mg/dL (ref 2.5–4.6)

## 2022-09-02 MED ORDER — GABAPENTIN 300 MG PO CAPS
300.0000 mg | ORAL_CAPSULE | Freq: Three times a day (TID) | ORAL | Status: DC
  Administered 2022-09-02 – 2022-09-07 (×15): 300 mg via ORAL
  Filled 2022-09-02 (×15): qty 1

## 2022-09-02 NOTE — Evaluation (Signed)
Physical Therapy Evaluation Patient Details Name: Dawn Burnett MRN: [id number] DOB: 1928-10-02 Today's Date: 09/02/2022  History of Present Illness  Pt is a 89 y.o. female with PMH that includes: a-fib, HTN, GI stromal tumor, CKD 3a, systolic CHF (EF 78% 09/9560), hypothyroidism and DM 2 who presents to the ED following an accidental fall after she slipped when she got up to go to the bathroom. Pt diagnosed with right periprosthetic proximal femur fracture and is s/p ORIF. A-fib with RVR and torsade developed after surgery, cleard for PT by cardiology 09/02/22.   Clinical Impression  Pt was pleasant and motivated to participate during the session and put forth good effort throughout. Pt required extensive mostly +2 assistance with bed mobility tasks and to come standing.  Once in standing pt was able to maintain RLE NWB status with pt's foot on top of this PT's foot to ensure compliance.  Pt was unable to advance her LLE or even shuffle her left foot on the floor.  Pt will benefit from PT services in a SNF setting upon discharge to safely address deficits listed in patient problem list for decreased caregiver assistance and eventual return to PLOF.          Recommendations for follow up therapy are one component of a multi-disciplinary discharge planning process, led by the attending physician.  Recommendations may be updated based on patient status, additional functional criteria and insurance authorization.  Follow Up Recommendations Skilled nursing-short term rehab (<3 hours/day) Can patient physically be transported by private vehicle: No    Assistance Recommended at Discharge Frequent or constant Supervision/Assistance  Patient can return home with the following  Two people to help with walking and/or transfers;A lot of help with bathing/dressing/bathroom;Assistance with cooking/housework;Direct supervision/assist for medications management;Assist for transportation;Help with stairs or ramp  for entrance    Equipment Recommendations Other (comment) (TBD at next venue of care)  Recommendations for Other Services       Functional Status Assessment Patient has had a recent decline in their functional status and/or demonstrates limited ability to make significant improvements in function in a reasonable and predictable amount of time     Precautions / Restrictions Precautions Precautions: Fall;Posterior Hip Precaution Booklet Issued: Yes (comment) Restrictions Weight Bearing Restrictions: Yes RLE Weight Bearing: Non weight bearing      Mobility  Bed Mobility Overal bed mobility: Needs Assistance Bed Mobility: Supine to Sit, Sit to Supine     Supine to sit: Mod assist Sit to supine: Max assist, +2 for physical assistance   General bed mobility comments: Pt required assist for BLE and trunk control with cues for use of bed rail    Transfers Overall transfer level: Needs assistance Equipment used: Rolling walker (2 wheels) Transfers: Sit to/from Stand Sit to Stand: Mod assist, +2 physical assistance, From elevated surface           General transfer comment: Pt able to come to standing and remain in standing with constant +2 mod A with pt's R foot placed on top of this PT's foot to ensure WB compliance    Ambulation/Gait               General Gait Details: Pt unable to move L foot from the floor during attempt at ambulation/shuffling  Stairs            Wheelchair Mobility    Modified Rankin (Stroke Patients Only)       Balance Overall balance assessment: Needs assistance Sitting-balance support: Feet  unsupported Sitting balance-Leahy Scale: Good     Standing balance support: Bilateral upper extremity supported, Reliant on assistive device for balance Standing balance-Leahy Scale: Poor                               Pertinent Vitals/Pain Pain Assessment Pain Assessment: No/denies pain    Home Living Family/patient  expects to be discharged to:: Private residence Living Arrangements: Alone Available Help at Discharge: Family;Available PRN/intermittently Type of Home: House Home Access: Ramped entrance       Home Layout: One level Home Equipment: Conservation officer, nature (2 wheels);Rollator (4 wheels);Cane - single point;BSC/3in1;Transport chair;Grab bars - tub/shower Additional Comments: Son and daughter live on either side of pt; pt sponge bathes, has not taken a shower in over a year.  History provided by dtr at bedside secondary to pt very HOH    Prior Function Prior Level of Function : Needs assist;History of Falls (last six months)             Mobility Comments: Mod Ind amb limited community distances with a rollator, family assist with bed mobility tasks, 2 falls in the last 6 months ADLs Comments: Pt sponge bathes, Ind with bathing and dressing, family assists with meds, housework, and transportation     Hand Dominance   Dominant Hand: Right    Extremity/Trunk Assessment   Upper Extremity Assessment Upper Extremity Assessment: Generalized weakness    Lower Extremity Assessment Lower Extremity Assessment: Generalized weakness       Communication   Communication: HOH  Cognition Arousal/Alertness: Awake/alert Behavior During Therapy: Flat affect Overall Cognitive Status: Difficult to assess                                 General Comments: Pt able to follow simple commands with extra time and cuing, very [facility]        General Comments      Exercises Other Exercises Other Exercises: Pt and daughter education on posterior hip precautions and WB status Other Exercises: HEP education with pt and daughter per handout   Assessment/Plan    PT Assessment Patient needs continued PT services  PT Problem List Decreased strength;Decreased activity tolerance;Decreased balance;Decreased mobility;Decreased knowledge of use of DME;Decreased safety awareness;Decreased knowledge of  precautions       PT Treatment Interventions DME instruction;Gait training;Functional mobility training;Therapeutic activities;Therapeutic exercise;Balance training;Patient/family education    PT Goals (Current goals can be found in the Care Plan section)  Acute Rehab PT Goals PT Goal Formulation: Patient unable to participate in goal setting Time For Goal Achievement: 09/15/22 Potential to Achieve Goals: Fair    Frequency 7X/week     Co-evaluation               AM-PAC PT "6 Clicks" Mobility  Outcome Measure Help needed turning from your back to your side while in a flat bed without using bedrails?: A Lot Help needed moving from lying on your back to sitting on the side of a flat bed without using bedrails?: A Lot Help needed moving to and from a bed to a chair (including a wheelchair)?: Total Help needed standing up from a chair using your arms (e.g., wheelchair or bedside chair)?: Total Help needed to walk in hospital room?: Total Help needed climbing 3-5 steps with a railing? : Total 6 Click Score: 8    End of Session Equipment  Utilized During Treatment: Gait belt Activity Tolerance: Patient tolerated treatment well Patient left: in bed;with call bell/phone within reach;with bed alarm set;with nursing/sitter in room;with SCD's reapplied Nurse Communication: Mobility status;Precautions;Weight bearing status PT Visit Diagnosis: Unsteadiness on feet (R26.81);History of falling (Z91.81);Other abnormalities of gait and mobility (R26.89);Muscle weakness (generalized) (M62.81);Repeated falls (R29.6)    Time: 0347-4259 PT Time Calculation (min) (ACUTE ONLY): 44 min   Charges:   PT Evaluation $PT Eval Moderate Complexity: 1 Mod PT Treatments $Therapeutic Activity: 8-22 mins        D. Scott Halton Neas PT, DPT 09/02/22, 11:02 AM

## 2022-09-02 NOTE — Progress Notes (Signed)
   Subjective: 2 Days Post-Op Procedure(s) (LRB): OPEN REDUCTION INTERNAL FIXATION (ORIF) PERIPROSTHETIC FEMORAL SHAFT FRACTURE (Right) Patient reports pain as mild.  Daughter at bedside, states that she is sleeping well at night and pain seems to be well-controlled.  Patient does complain of a sore throat but no difficulty swallowing.  Daughter states she is eating very well with no complications. Plan is to go Skilled nursing facility after hospital stay.  Objective: Vital signs in last 24 hours: Temp:  [97.6 F (36.4 C)-98.9 F (37.2 C)] 98 F (36.7 C) (01/24 1119) Pulse Rate:  [78-96] 93 (01/24 1119) Resp:  [13-24] 20 (01/24 1119) BP: (119-148)/(45-96) 148/76 (01/24 1119) SpO2:  [88 %-97 %] 88 % (01/24 1119)  Intake/Output from previous day: 01/23 0701 - 01/24 0700 In: 1435.6 [P.O.:1080; I.V.:305.6; IV Piggyback:50] Out: 1779 [Urine:1840] Intake/Output this shift: Total I/O In: 240 [P.O.:240] Out: 600 [Urine:600]  Recent Labs    08/31/22 0344 09/01/22 0243 09/02/22 0454  HGB 10.7* 9.6* 8.2*   Recent Labs    09/01/22 0243 09/02/22 0454  WBC 16.3* 15.0*  RBC 3.02* 2.57*  HCT 29.2* 25.3*  PLT 113* 132*   Recent Labs    09/01/22 0243 09/02/22 0454  NA 136 134*  K 4.5 4.7  CL 100 99  CO2 23 27  BUN 21 24*  CREATININE 0.99 1.08*  GLUCOSE 280* 195*  CALCIUM 8.6* 8.7*   No results for input(s): "LABPT", "INR" in the last 72 hours.   EXAM General - Patient is Alert, Appropriate, and Oriented Extremity - Neurovascular intact Sensation intact distally Intact pulses distally Dorsiflexion/Plantar flexion intact No cellulitis present Compartment soft Dressing - dressing C/D/I and no drainage Motor Function - intact, moving foot and toes well on exam.   Past Medical History:  Diagnosis Date   Arthritis    Atrial fibrillation (HCC)    CHF (congestive heart failure) (HCC)    Diabetes mellitus without complication (HCC)    GERD (gastroesophageal reflux  disease)    Hiatal hernia    Hypertension    Neuropathy    Skin cancer, basal cell    Thyroid disease     Assessment/Plan:   2 Days Post-Op Procedure(s) (LRB): OPEN REDUCTION INTERNAL FIXATION (ORIF) PERIPROSTHETIC FEMORAL SHAFT FRACTURE (Right) Principal Problem:   Periprosthetic fracture around internal prosthetic right hip joint (HCC) Active Problems:   Atrial fibrillation (HCC)   Essential (primary) hypertension   Adult hypothyroidism   Uncontrolled type 2 diabetes mellitus with hyperglycemia, without long-term current use of insulin (HCC)   Stage 3a chronic kidney disease (HCC)   Chronic anticoagulation   History of malignant gastrointestinal stromal tumor (GIST)   Chronic systolic CHF (congestive heart failure) (HCC)  Estimated body mass index is 22.66 kg/m as calculated from the following:   Height as of this encounter: '5\' 8"'$  (1.727 m).   Weight as of this encounter: 67.6 kg. Advance diet Up with therapy Doing well this morning.  More alert.  Pain well-controlled. Acute post op blood loss anemia -hemoglobin 8.2.  Recheck hemoglobin in the morning Vital signs are stable Incision site appears well with minimal swelling, no drainage.     DVT Prophylaxis - TED hose and SCDs Eliquis Non weight bearing Right lower extremity   T. Rachelle Hora, PA-C [place] 09/02/2022, 12:00 PM

## 2022-09-02 NOTE — Inpatient Diabetes Management (Signed)
Inpatient Diabetes Program Recommendations  AACE/ADA: New Consensus Statement on Inpatient Glycemic Control (2015)  Target Ranges:  Prepandial:   less than 140 mg/dL      Peak postprandial:   less than 180 mg/dL (1-2 hours)      Critically ill patients:  140 - 180 mg/dL    Latest Reference Range & Units 09/01/22 07:47 09/01/22 11:15 09/01/22 16:36 09/01/22 21:07  Glucose-Capillary 70 - 99 mg/dL 260 (H) 226 (H) 189 (H) 253 (H)  (H): Data is abnormally high  Latest Reference Range & Units 09/02/22 08:55 09/02/22 12:03  Glucose-Capillary 70 - 99 mg/dL 190 (H) 238 (H)  (H): Data is abnormally high     Home DM Meds: Januvia 100 mg daily  Current Orders: Novolog Moderate Correction Scale/ SSI (0-15 units) TID AC + HS   MD- Note pt ate 100% breakfast this AM.  CBGs have been elevated after meals.  Please consider adding low dose Novolog Meal Coverage while home Januvia on hold:  Novolog 3 units TID with meals HOLD if pt NPO HOLD if pt eats <50% meals    --Will follow patient during hospitalization--  Wyn Quaker RN, MSN, [place] Diabetes Coordinator Inpatient Glycemic Control Team Team Pager: [phone] (8a-5p)

## 2022-09-02 NOTE — TOC Initial Note (Signed)
Transition of Care [facility]) - Initial/Assessment Note    Patient Details  Name: Dawn Burnett MRN: [id number] Date of Birth: 06/02/1929  Transition of Care [facility]) CM/SW Contact:    Tiburcio Bash, LCSW Phone Number: 09/02/2022, 2:21 PM  Clinical Narrative:                  CSW spoke with patient's daughter [name] regarding SNF recommendations, she's in agreement with preference for [facility].   Referrals sent, pending bed offers at this time.   Expected Discharge Plan: Skilled Nursing Facility Barriers to Discharge: Continued Medical Work up   Patient Goals and CMS Choice Patient states their goals for this hospitalization and ongoing recovery are:: to go home CMS Medicare.gov Compare Post Acute Care list provided to:: Patient Choice offered to / list presented to : Patient      Expected Discharge Plan and Services       Living arrangements for the past 2 months: Single Family Home                                      Prior Living Arrangements/Services Living arrangements for the past 2 months: Single Family Home Lives with:: Self                   Activities of Daily Living Home Assistive Devices/Equipment: Environmental consultant (specify type), Eyeglasses, Hearing aid (rollator) ADL Screening (condition at time of admission) Patient's cognitive ability adequate to safely complete daily activities?: No Is the patient deaf or have difficulty hearing?: Yes Does the patient have difficulty seeing, even when wearing glasses/contacts?: Yes Does the patient have difficulty concentrating, remembering, or making decisions?: No Patient able to express need for assistance with ADLs?: Yes Does the patient have difficulty dressing or bathing?: Yes Independently performs ADLs?: No Does the patient have difficulty walking or climbing stairs?: Yes Weakness of Legs: Both Weakness of Arms/Hands: Both  Permission Sought/Granted                  Emotional Assessment               Admission diagnosis:  Chronic atrial fibrillation ([place]) [I48.20] Current use of long term anticoagulation [Z79.01] Other fracture of right femur, initial encounter for closed fracture ([place]) [S72.8X1A] Closed fracture of proximal end of femur, right, initial encounter ([place]) [S72.001A] Bilateral hearing loss, unspecified hearing loss type [H91.93] Patient Active Problem List   Diagnosis Date Noted   Periprosthetic fracture around internal prosthetic right hip joint ([place]) 08/30/2022   Stage 3a chronic kidney disease ([place]) 08/30/2022   Chronic anticoagulation 89/38/1017   Chronic systolic CHF (congestive heart failure) ([place]) 08/30/2022   Healthcare maintenance 08/28/2022   Chronic CHF (congestive heart failure) ([place]) 12/25/2017   History of malignant gastrointestinal stromal tumor (GIST) 09/09/2017   Incontinence of feces with fecal urgency 09/06/2017   Osteoarthritis of knee 03/03/2017   Uncontrolled type 2 diabetes mellitus with hyperglycemia, without long-term current use of insulin ([place]) 09/01/2016   Gastrointestinal stromal tumor (GIST) of stomach ([place]) 12/21/2014   Allergic rhinitis 12/07/2014   Arthritis 12/07/2014   Atrial fibrillation, controlled ([place]) 12/07/2014   Arteriosclerosis of coronary artery 12/07/2014   Essential (primary) hypertension 12/07/2014   Acid reflux 12/07/2014   History of prolonged Q-T interval on ECG 12/07/2014   Blood glucose elevated 12/07/2014   Adult hypothyroidism 12/07/2014   Urinary incontinence 12/07/2014  Neuropathy 12/07/2014   Osteopenia 12/07/2014   HLD (hyperlipidemia) 12/07/2014   Alteration in bowel elimination: incontinence 12/07/2014   Hiatal hernia    Atrial fibrillation ([place])    Basal cell carcinoma of ear 11/14/2014   Bladder infection, chronic 06/01/2012   Urethral caruncle 06/01/2012   SCC (squamous cell carcinoma), face 06/16/2011   NONSPECIFIC ABN FINDING RAD & OTH EXAM GI TRACT 04/24/2009   PCP:   [name], MD Pharmacy:   [place], [place] - [address] [place] [address] Phone: [phone] Fax: [phone]     Social Determinants of Health (SDOH) Social History: SDOH Screenings   Food Insecurity: No Food Insecurity (08/30/2022)  Housing: Low Risk  (08/30/2022)  Transportation Needs: No Transportation Needs (08/30/2022)  Utilities: Not At Risk (08/30/2022)  Alcohol Screen: Low Risk  (09/09/2020)  Depression (PHQ2-9): Medium Risk (09/09/2020)  Financial Resource Strain: Low Risk  (01/24/2020)  Physical Activity: Inactive (01/24/2020)  Social Connections: Socially Isolated (01/24/2020)  Stress: No Stress Concern Present (01/24/2020)  Tobacco Use: Low Risk  (09/01/2022)   SDOH Interventions:     Readmission Risk Interventions     No data to display

## 2022-09-02 NOTE — Progress Notes (Signed)
Speech Language Pathology Treatment: Dysphagia  Patient Details Name: Dawn Burnett MRN: [id number] DOB: 10-27-1928 Today's Date: 09/02/2022 Time: 1350-1420 SLP Time Calculation (min) (ACUTE ONLY): 30 min  Assessment / Plan / Recommendation Clinical Impression  Met w/ pt and Family in room today post lunch meal. Pt was resting in bed. Pt had consumed the majority of her lunch meal noted. Family reported pt is "doing well eating her meals" and denied any coughing or overt s/s of aspiration. Family endorsed that pt can be "impulsive" when feeding herself and puts "too much in her mouth" at a time -- she described pt pocketed the food in her mouth and that she then helped to feed her to slow down the eating and ensure oral clearing b/t bites. Pt is extremely HOH and needs support w/ ADLs. She appears weak; Family agreed. She will transition to SNF at D/C.   On RA, afebrile, WBC  trending down.   Discussed w/ Family pt's oropharyngeal swallowing and general aspiration precautions including need to sit upright, eat/drink slowly, clear mouth b/t bites. Encouraged monitoring any impulsive eating behaviors and minimize this in order to reduce risk for choking -- unsure if related to any Cognitive decline(?). It is recommended that pt have Supervision at meals for follow through w/ general aspiration precautions and support feeding in setting of weakness -- Family agreed.  Also discussed impact of Esophageal phase dysmotility and Hiatal Hernia on pharyngeal swallowing; REFLUX activity on swallowing. Encouraged following general REFLUX precautions including remaining sitting up post meals for ~45-60 mins, eating slowly at meals, monitor dense foods such as meats/breads and offer condiments/chopping small to aid Esophageal clearing. Family endorsed a dry cough post meals -- this was heard during this session much time post lunch meal(potential globus, "reflux cough"?). Encouraged Family to have pt f/u w/ GI for  formal assessment of Esophageal phase Dysmotility and education.  Recommend a continue a fairly Regular diet (cut, moistened foods) w/ thin liquids. General aspiration precautions. Rest Breaks/Pauses during meals/oral intake to allow for Esophageal clearing. REFLUX precautions strongly recommended to lessen chance for Regurgitation -- HOB elevated at night when sleeping. Monitor for any Impulsive eating/drinking behaviors -- Supervision at meals as needed. Tray setup and support at meals as needed d/t weakness.    Discussion and general handouts given on REFLUX, foods/diet. MD to reconsult ST services if any new needs while admitted. NSG updated. Family appreciative of Education information.     HPI HPI: Pt is a 89 y.o. female with medical history significant for Hiatal Hernia, Atrial fibrillation on Eliquis, HTN, history of gastrointestinal stromal tumor, CKD 3a, systolic CHF (EF 47% 0/9628), hypothyroidism and non-insulin-dependent type 2 diabetes who presents to the ED following an accidental fall after she slipped when she got up to go to the bathroom, landing on her right hip.  She denies hitting anywhere else and experiences pain only in her right hip.  History is provided by daughter at bedside as patient is very hard of hearing.   Imaging showing periprosthetic right proximal femur fracture.  Chest x-ray was clear.  Previous Head CT Imaging in 2022: Chronic ischemic microangiopathy and generalized atrophy without acute intracranial abnormality. Pt diagnosed with right periprosthetic proximal femur fracture and is s/p ORIF surgery. A-fib with RVR and torsade developed after surgery per chart.       SLP Plan  All goals met at this time; education completed w/ Family in room.      Recommendations for follow up therapy  are one component of a multi-disciplinary discharge planning process, led by the attending physician.  Recommendations may be updated based on patient status, additional  functional criteria and insurance authorization.    Recommendations  Diet recommendations: Regular;Thin liquid (w/ Cut meats/foods; moitstened foods.) Liquids provided via: Cup;Straw (monitor straw use as needed) Medication Administration: Whole meds with puree (vs need to Crush i puree) Supervision: Patient able to self feed;Staff to assist with self feeding;Full supervision/cueing for compensatory strategies (for impulsive eating/drinking) Compensations: Minimize environmental distractions;Slow rate;Small sips/bites;Lingual sweep for clearance of pocketing;Follow solids with liquid Postural Changes and/or Swallow Maneuvers: Out of bed for meals;Seated upright 90 degrees;Upright 30-60 min after meal                General recommendations:  (Palliative Care f/u for [employer]; Dietician f/u for support) Oral Care Recommendations: Oral care BID;Oral care before and after PO;Staff/trained caregiver to provide oral care Follow Up Recommendations: Follow physician's recommendations for discharge plan and follow up therapies Assistance recommended at discharge: Frequent or constant Supervision/Assistance (impulsive eating/drinking behaviors reported) SLP Visit Diagnosis: Dysphagia, pharyngoesophageal phase (R13.14) (Hiatal Hernia) Plan: All goals met             Dawn Kenner, MS, [place]; [place] [phone] (ascom) Analiyah Lechuga  09/02/2022, 4:05 PM

## 2022-09-02 NOTE — NC FL2 (Signed)
[place] LEVEL OF CARE FORM     IDENTIFICATION  Patient Name: Dawn Burnett Birthdate: [birth year] Sex: female Admission Date (Current Location): 08/30/2022  [facility] and [place] Number:  Engineering geologist and Address:  [facility], [address] [place], [place] [address]      Provider Number: [id number]  Attending Physician Name and Address:  Val Riles, MD  Relative Name and Phone Number:  [name] (Daughter)  [phone]    Current Level of Care: Hospital Recommended Level of Care: [place] Prior Approval Number:    Date Approved/Denied:   PASRR Number: [id number]  Discharge Plan: SNF    Current Diagnoses: Patient Active Problem List   Diagnosis Date Noted   Periprosthetic fracture around internal prosthetic right hip joint ([place]) 08/30/2022   Stage 3a chronic kidney disease ([place]) 08/30/2022   Chronic anticoagulation 33/82/5053   Chronic systolic CHF (congestive heart failure) ([place]) 08/30/2022   Healthcare maintenance 08/28/2022   Chronic CHF (congestive heart failure) ([place]) 12/25/2017   History of malignant gastrointestinal stromal tumor (GIST) 09/09/2017   Incontinence of feces with fecal urgency 09/06/2017   Osteoarthritis of knee 03/03/2017   Uncontrolled type 2 diabetes mellitus with hyperglycemia, without long-term current use of insulin ([place]) 09/01/2016   Gastrointestinal stromal tumor (GIST) of stomach ([place]) 12/21/2014   Allergic rhinitis 12/07/2014   Arthritis 12/07/2014   Atrial fibrillation, controlled ([place]) 12/07/2014   Arteriosclerosis of coronary artery 12/07/2014   Essential (primary) hypertension 12/07/2014   Acid reflux 12/07/2014   History of prolonged Q-T interval on ECG 12/07/2014   Blood glucose elevated 12/07/2014   Adult hypothyroidism 12/07/2014   Urinary incontinence 12/07/2014   Neuropathy 12/07/2014   Osteopenia 12/07/2014   HLD (hyperlipidemia) 12/07/2014   Alteration  in bowel elimination: incontinence 12/07/2014   Hiatal hernia    Atrial fibrillation (HCC)    Basal cell carcinoma of ear 11/14/2014   Bladder infection, chronic 06/01/2012   Urethral caruncle 06/01/2012   SCC (squamous cell carcinoma), face 06/16/2011   NONSPECIFIC ABN FINDING RAD & OTH EXAM GI TRACT 04/24/2009    Orientation RESPIRATION BLADDER Height & Weight     Self, Place  Normal Incontinent, External catheter Weight: 149 lb 0.5 oz (67.6 kg) Height:  '5\' 8"'$  (172.7 cm)  BEHAVIORAL SYMPTOMS/MOOD NEUROLOGICAL BOWEL NUTRITION STATUS      Continent Diet (see discharge summary)  AMBULATORY STATUS COMMUNICATION OF NEEDS Skin   Extensive Assist Verbally Other (Comment) (abrasion right hip, closed incision right hip)                       Personal Care Assistance Level of Assistance  Bathing, Dressing, Total care, Feeding Bathing Assistance: Limited assistance Feeding assistance: Independent Dressing Assistance: Limited assistance Total Care Assistance: Maximum assistance   Functional Limitations Info  Sight, Hearing, Speech Sight Info: Impaired Hearing Info: Impaired Speech Info: Adequate    SPECIAL CARE FACTORS FREQUENCY  PT (By licensed PT), OT (By licensed OT)     PT Frequency: min 4x weekly OT Frequency: min 4x weekly            Contractures Contractures Info: Not present    Additional Factors Info  Code Status, Allergies Code Status Info: DNR Allergies Info: Iodinated Contrast Media  Augmentin (Amoxicillin-pot Clavulanate)  Codeine  Crestor (Rosuvastatin)  Levaquin (Levofloxacin In D5w)  Naproxen  Prednisone  Sulfa Antibiotics  Sulfa Antibiotics  Sulfacetamide Sodium  Tolectin (Tolmetin)  Current Medications (09/02/2022):  This is the current hospital active medication list Current Facility-Administered Medications  Medication Dose Route Frequency Provider Last Rate Last Admin   acetaminophen (TYLENOL) tablet 1,000 mg  1,000 mg Oral Q12H  Val Riles, MD   1,000 mg at 09/02/22 0926   acetaminophen (TYLENOL) tablet 325-650 mg  325-650 mg Oral Q6H PRN Val Riles, MD   650 mg at 09/02/22 1359   apixaban (ELIQUIS) tablet 2.5 mg  2.5 mg Oral BID Steffanie Rainwater, MD   2.5 mg at 09/02/22 0926   atorvastatin (LIPITOR) tablet 20 mg  20 mg Oral QHS Athena Masse, MD   20 mg at 09/01/22 2119   digoxin (LANOXIN) tablet 0.125 mg  0.125 mg Oral Daily Scoggins, Amber, NP   0.125 mg at 09/02/22 0927   diltiazem (CARDIZEM) tablet 30 mg  30 mg Oral Q6H Val Riles, MD   30 mg at 09/02/22 1153   docusate sodium (COLACE) capsule 100 mg  100 mg Oral BID Steffanie Rainwater, MD   100 mg at 09/02/22 0926   feeding supplement (ENSURE ENLIVE / ENSURE PLUS) liquid 237 mL  237 mL Oral BID BM Val Riles, MD   237 mL at 09/02/22 0929   furosemide (LASIX) tablet 20 mg  20 mg Oral Daily Judd Gaudier V, MD   20 mg at 09/02/22 1610   gabapentin (NEURONTIN) capsule 600 mg  600 mg Oral TID WC Val Riles, MD   600 mg at 09/02/22 1153   HYDROcodone-acetaminophen (NORCO/VICODIN) 5-325 MG per tablet 1-2 tablet  1-2 tablet Oral Q4H PRN Steffanie Rainwater, MD       imipramine (TOFRANIL) tablet 25 mg  25 mg Oral QHS Val Riles, MD   25 mg at 09/01/22 2120   insulin aspart (novoLOG) injection 0-15 Units  0-15 Units Subcutaneous TID WC Athena Masse, MD   3 Units at 09/02/22 1155   insulin aspart (novoLOG) injection 0-5 Units  0-5 Units Subcutaneous QHS Athena Masse, MD   3 Units at 09/01/22 2122   levothyroxine (SYNTHROID) tablet 50 mcg  50 mcg Oral Q0600 Athena Masse, MD   50 mcg at 09/02/22 0526   menthol-cetylpyridinium (CEPACOL) lozenge 3 mg  1 lozenge Oral PRN Steffanie Rainwater, MD       Or   phenol (CHLORASEPTIC) mouth spray 1 spray  1 spray Mouth/Throat PRN Steffanie Rainwater, MD       metoCLOPramide (REGLAN) tablet 5-10 mg  5-10 mg Oral Q8H PRN Steffanie Rainwater, MD       Or   metoCLOPramide (REGLAN) injection 5-10 mg  5-10 mg Intravenous Q8H PRN  Steffanie Rainwater, MD       metoprolol succinate (TOPROL-XL) 24 hr tablet 100 mg  100 mg Oral Daily Judd Gaudier V, MD   100 mg at 09/02/22 9604   mirabegron ER (MYRBETRIQ) tablet 50 mg  50 mg Oral QHS Val Riles, MD   50 mg at 09/01/22 2120   morphine (PF) 2 MG/ML injection 1 mg  1 mg Intravenous Q2H PRN Athena Masse, MD   1 mg at 09/01/22 0225   multivitamin with minerals tablet 1 tablet  1 tablet Oral Daily Val Riles, MD   1 tablet at 09/02/22 0926   ondansetron (ZOFRAN) tablet 4 mg  4 mg Oral Q6H PRN Steffanie Rainwater, MD       Or   ondansetron (ZOFRAN) injection 4 mg  4 mg Intravenous Q6H PRN Steffanie Rainwater, MD  pantoprazole (PROTONIX) EC tablet 40 mg  40 mg Oral Daily Val Riles, MD   40 mg at 09/02/22 2258   polyvinyl alcohol (LIQUIFILM TEARS) 1.4 % ophthalmic solution 1 drop  1 drop Both Eyes PRN Val Riles, MD       sacubitril-valsartan (ENTRESTO) 97-103 mg per tablet  1 tablet Oral BID Athena Masse, MD   1 tablet at 09/02/22 3462   spironolactone (ALDACTONE) tablet 25 mg  25 mg Oral Daily Athena Masse, MD   25 mg at 09/02/22 1947   traMADol (ULTRAM) tablet 50 mg  50 mg Oral Q6H PRN Steffanie Rainwater, MD   50 mg at 09/01/22 2121     Discharge Medications: Please see discharge summary for a list of discharge medications.  Relevant Imaging Results:  Relevant Lab Results:   Additional Information SSN: [id number]  Tiburcio Bash, LCSW

## 2022-09-02 NOTE — Progress Notes (Signed)
[facility] Progress Note  Patient: Dawn Burnett    KXF:[id number]  DOA: 08/30/2022     Date of Service: the patient was seen and examined on 09/02/2022  Chief Complaint  Patient presents with   Hip Pain   Fall   Brief hospital course:  Dawn Burnett is a 89 y.o. female with medical history significant for Atrial fibrillation on Eliquis, HTN, history of gastrointestinal stromal tumor, CKD 3a, systolic CHF (EF 69% 01/7892), hypothyroidism and non-insulin-dependent type 2 diabetes who presents to the ED following an accidental fall after she slipped when she got up to go to the bathroom, landing on her right hip.  She denies hitting anywhere else and experiences pain only in her right hip.  History is provided by daughter at bedside as patient is very hard of hearing. ED course and data review: BP 176/77 with otherwise normal vitals.  Labs notable for glucose 217, hemoglobin 11.5EKG, personally viewed and interpreted showing A-fib at 92 with LBBB.  X-ray showing periprosthetic right proximal femur fracture.  Chest x-ray was clear.  The ED provider spoke with orthopedist, Dr. Karel Jarvis who will take patient to the OR following a 24-hour Eliquis washout.  Hospitalist consulted for admission.     Assessment and Plan:  # Periprosthetic fracture around internal prosthetic right hip joint ([place]) Resumed Eliquis on 1/23 Orthopedic surgery was consulted, plan is for ORIF today Cardiology was consulted for preop clearance, patient remains at moderate to high risk.  Due to comorbidities Continue as needed medication for pain control Continue fall precautions  PT OT eval done recommended SNF placement, TOC is following   A-fib with RVR and torsade developed after surgery on 1/22 H/o Atrial fibrillation (HCC) Chronic anticoagulation, on 1/23 resumed Eliquis, cleared by ortho Continue metoprolol 100 mg POD home dose S/p IV Lopressor and IV Cardizem given, s/p Cardizem IV infusion, gradually weaned  off Magnesium IV 1 g followed by 2 g given due to torsade, cardiology is aware Digoxin was given in the OR, patient was started on digoxin 1.25 mg p.o. daily Started Cardizem IR 30 mg p.o. every 6 hourly with holding parameters    Chronic systolic CHF (congestive heart failure) (HCC) Clinically euvolemic, last EF on record 36% in July 2020 Continue digoxin, furosemide, metoprolol, Entresto and spironolactone 08/31/22 TTE shows LVEF 25 to 30%, severely decreased LV function, moderate LV hypertrophy.  Moderate MR Clinically no signs of volume overload    Uncontrolled type 2 diabetes mellitus with hyperglycemia, without long-term current use of insulin (HCC) Sliding scale insulin coverage   History of malignant gastrointestinal stromal tumor (GIST) No acute issues suspected   Stage 3a chronic kidney disease ([place]) Renal function at baseline   Adult hypothyroidism Continue levothyroxine   Essential (primary) hypertension BP controlled.  Continue home antihypertensives   Peripheral neuropathy, continued gabapentin 600 mg p.o. 3 times daily home dose  Bilateral sensorineural hearing loss, very hard of hearing.  Body mass index is 22.66 kg/m.  Nutrition Problem: Increased nutrient needs Etiology: post-op healing Interventions: Interventions: Ensure Enlive (each supplement provides 350kcal and 20 grams of protein), MVI     Diet: Regular diet DVT Prophylaxis: Eliquis  Advance goals of care discussion: DNR  Family Communication: family was present at bedside, at the time of interview.  The pt provided permission to discuss medical plan with the family. Opportunity was given to ask question and all questions were answered satisfactorily.   Disposition:  Pt is from Home, admitted with fall right  femur fracture, s/p ORIF done on 1/22, PT and OT eval done, recommend SNF placement.  Patient is currently stable to discharge when bed will be available. Discharge to SNF,  when bed will  be available.     Subjective: No significant events overnight, patient's heart rate remained well-controlled, denied any chest pain or palpitations, patient is resting comfortably.  Patient's family was at bedside, management plan discussed.   Physical Exam: General: NAD, lying comfortably Appear in no distress, affect appropriate Eyes: PERRLA ENT: Oral Mucosa Clear, moist  Neck: no JVD,  Cardiovascular: Irregular rhythm,  no Murmur,  Respiratory: good respiratory effort, Bilateral Air entry equal and Decreased, no Crackles, no wheezes Abdomen: Bowel Sound present, Soft and no tenderness,  Skin: no rashes Extremities: no Pedal edema, no calf tenderness, s/p right hip ORIF, dressing CDI Neurologic: without any new focal findings Gait not checked due to patient safety concerns  Vitals:   09/02/22 0526 09/02/22 0531 09/02/22 0809 09/02/22 1119  BP: (!) 136/45 129/71 (!) 136/54 (!) 148/76  Pulse:  78 96 93  Resp:  '18 18 20  '$ Temp:  97.6 F (36.4 C) 97.6 F (36.4 C) 98 F (36.7 C)  TempSrc:  Oral Oral Oral  SpO2:  90% 90% (!) 88%  Weight:      Height:        Intake/Output Summary (Last 24 hours) at 09/02/2022 1605 Last data filed at 09/02/2022 1300 Gross per 24 hour  Intake 487.17 ml  Output 2140 ml  Net -1652.83 ml   Filed Weights   08/30/22 0656 08/31/22 1121  Weight: 67.6 kg 67.6 kg    Data Reviewed: I have personally reviewed and interpreted daily labs, tele strips, imagings as discussed above. I reviewed all nursing notes, pharmacy notes, vitals, pertinent old records I have discussed plan of care as described above with RN and patient/family.  CBC: Recent Labs  Lab 08/30/22 0238 08/31/22 0344 09/01/22 0243 09/02/22 0454  WBC 9.7 9.6 16.3* 15.0*  NEUTROABS 7.7  --   --   --   HGB 11.5* 10.7* 9.6* 8.2*  HCT 35.6* 32.2* 29.2* 25.3*  MCV 98.6 95.8 96.7 98.4  PLT 131* 145* 113* 762*   Basic Metabolic Panel: Recent Labs  Lab 08/30/22 0238 08/31/22 0344  09/01/22 0243 09/02/22 0454  NA 138 136 136 134*  K 4.2 4.4 4.5 4.7  CL 103 98 100 99  CO2 '25 29 23 27  '$ GLUCOSE 217* 275* 280* 195*  BUN 24* 24* 21 24*  CREATININE 0.95 1.12* 0.99 1.08*  CALCIUM 9.0 9.1 8.6* 8.7*  MG  --  1.8 1.8 2.5*  PHOS  --  4.0 3.8 3.4    Studies: No results found.  Scheduled Meds:  acetaminophen  1,000 mg Oral Q12H   apixaban  2.5 mg Oral BID   atorvastatin  20 mg Oral QHS   digoxin  0.125 mg Oral Daily   diltiazem  30 mg Oral Q6H   docusate sodium  100 mg Oral BID   feeding supplement  237 mL Oral BID BM   furosemide  20 mg Oral Daily   gabapentin  300 mg Oral TID WC   imipramine  25 mg Oral QHS   insulin aspart  0-15 Units Subcutaneous TID WC   insulin aspart  0-5 Units Subcutaneous QHS   levothyroxine  50 mcg Oral Q0600   metoprolol succinate  100 mg Oral Daily   mirabegron ER  50 mg Oral QHS   multivitamin  with minerals  1 tablet Oral Daily   pantoprazole  40 mg Oral Daily   sacubitril-valsartan  1 tablet Oral BID   spironolactone  25 mg Oral Daily   Continuous Infusions:   PRN Meds: acetaminophen, HYDROcodone-acetaminophen, menthol-cetylpyridinium **OR** phenol, metoCLOPramide **OR** metoCLOPramide (REGLAN) injection, morphine injection, ondansetron **OR** ondansetron (ZOFRAN) IV, polyvinyl alcohol, traMADol  Time spent: 40 minutes  Author: Val Riles. MD [facility] 09/02/2022 4:05 PM  To reach On-call, see care teams to locate the attending and reach out to them via [email or web address]. If 7PM-7AM, please contact night-coverage If you still have difficulty reaching the attending provider, please page the [facility] (Director on Call) for [facility] on amion for assistance.

## 2022-09-02 NOTE — Care Management Important Message (Signed)
Important Message  Patient Details  Name: Dawn Burnett MRN: [id number] Date of Birth: 1929-03-29   Medicare Important Message Given:  Yes     [name] 09/02/2022, 3:01 PM

## 2022-09-03 DIAGNOSIS — M9701XA Periprosthetic fracture around internal prosthetic right hip joint, initial encounter: Secondary | ICD-10-CM | POA: Diagnosis not present

## 2022-09-03 LAB — CBC
HCT: 24.1 % — ABNORMAL LOW (ref 36.0–46.0)
Hemoglobin: 8.1 g/dL — ABNORMAL LOW (ref 12.0–15.0)
MCH: 32.5 pg (ref 26.0–34.0)
MCHC: 33.6 g/dL (ref 30.0–36.0)
MCV: 96.8 fL (ref 80.0–100.0)
Platelets: 118 10*3/uL — ABNORMAL LOW (ref 150–400)
RBC: 2.49 MIL/uL — ABNORMAL LOW (ref 3.87–5.11)
RDW: 13.2 % (ref 11.5–15.5)
WBC: 9.8 10*3/uL (ref 4.0–10.5)
nRBC: 0 % (ref 0.0–0.2)

## 2022-09-03 LAB — BASIC METABOLIC PANEL
Anion gap: 5 (ref 5–15)
BUN: 30 mg/dL — ABNORMAL HIGH (ref 8–23)
CO2: 27 mmol/L (ref 22–32)
Calcium: 8.4 mg/dL — ABNORMAL LOW (ref 8.9–10.3)
Chloride: 100 mmol/L (ref 98–111)
Creatinine, Ser: 1.16 mg/dL — ABNORMAL HIGH (ref 0.44–1.00)
GFR, Estimated: 45 mL/min — ABNORMAL LOW (ref >60)
Glucose, Bld: 199 mg/dL — ABNORMAL HIGH (ref 70–99)
Potassium: 4.5 mmol/L (ref 3.5–5.1)
Sodium: 132 mmol/L — ABNORMAL LOW (ref 135–145)

## 2022-09-03 LAB — MISC LABCORP TEST (SEND OUT): Labcorp test code: 81950

## 2022-09-03 LAB — OSMOLALITY: Osmolality: 293 mOsm/kg (ref 275–295)

## 2022-09-03 LAB — GLUCOSE, CAPILLARY
Glucose-Capillary: 209 mg/dL — ABNORMAL HIGH (ref 70–99)
Glucose-Capillary: 239 mg/dL — ABNORMAL HIGH (ref 70–99)
Glucose-Capillary: 262 mg/dL — ABNORMAL HIGH (ref 70–99)
Glucose-Capillary: 178 mg/dL — ABNORMAL HIGH (ref 70–99)

## 2022-09-03 LAB — PHOSPHORUS: Phosphorus: 3 mg/dL (ref 2.5–4.6)

## 2022-09-03 LAB — MAGNESIUM: Magnesium: 2.2 mg/dL (ref 1.7–2.4)

## 2022-09-03 MED ORDER — GLUCERNA SHAKE PO LIQD
237.0000 mL | Freq: Three times a day (TID) | ORAL | Status: DC
  Administered 2022-09-03 – 2022-09-07 (×8): 237 mL via ORAL

## 2022-09-03 MED ORDER — DILTIAZEM HCL 30 MG PO TABS
30.0000 mg | ORAL_TABLET | Freq: Two times a day (BID) | ORAL | Status: DC
  Administered 2022-09-03 – 2022-09-04 (×3): 30 mg via ORAL
  Filled 2022-09-03 (×4): qty 1

## 2022-09-03 MED ORDER — POLYETHYLENE GLYCOL 3350 17 G PO PACK
17.0000 g | PACK | Freq: Two times a day (BID) | ORAL | Status: DC
  Administered 2022-09-03 – 2022-09-07 (×7): 17 g via ORAL
  Filled 2022-09-03 (×7): qty 1

## 2022-09-03 MED ORDER — BISACODYL 5 MG PO TBEC
10.0000 mg | DELAYED_RELEASE_TABLET | Freq: Once | ORAL | Status: AC
  Administered 2022-09-03: 10 mg via ORAL
  Filled 2022-09-03: qty 2

## 2022-09-03 MED ORDER — BISACODYL 5 MG PO TBEC
10.0000 mg | DELAYED_RELEASE_TABLET | Freq: Every day | ORAL | Status: DC
  Administered 2022-09-03 – 2022-09-06 (×4): 10 mg via ORAL
  Filled 2022-09-03 (×4): qty 2

## 2022-09-03 MED ORDER — BISACODYL 10 MG RE SUPP: 10.0000 mg | Freq: Every day | RECTAL | Status: DC | PRN

## 2022-09-03 NOTE — Progress Notes (Signed)
SUBJECTIVE: Patient is a 89 y.o. female with medical history significant for Atrial fibrillation on Eliquis, HTN, history of gastrointestinal stromal tumor, CKD 3a, systolic CHF (EF 56% 03/1274), hypothyroidism and non-insulin-dependent type 2 diabetes who presented to the ED on 08/30/22 following an accidental fall after she slipped when she got up to go to the bathroom, landing on her right hip.   Patient found to have a right hip periprosthetic proximal femur fracture around a cemented hemiarthroplasty. Patient underwent open reduction internal fixation right periprosthetic femur fracture.    On 08/31/22, patient became agitated, received 2 mg Haldol. Patient had a run of [place] at 0200 with complaints of chest pain. Patient received morphine and Mg. Cardizem was increased to 10 mg/hr. Troponin levels [id number].  No further events.     Vitals:   09/02/22 2000 09/03/22 0000 09/03/22 0359 09/03/22 0646  BP: (!) 129/48  (!) 109/47 (!) 117/39  Pulse: 75 (!) 58 66   Resp: 18  16   Temp: 99.1 F (37.3 C) 98.6 F (37 C) 99 F (37.2 C)   TempSrc: Oral Oral Oral   SpO2: 92% 90% 92%   Weight:      Height:        Intake/Output Summary (Last 24 hours) at 09/03/2022 0854 Last data filed at 09/03/2022 0405 Gross per 24 hour  Intake 720 ml  Output 1275 ml  Net -555 ml    LABS: Basic Metabolic Panel: Recent Labs    09/02/22 0454 09/03/22 0621  NA 134* 132*  K 4.7 4.5  CL 99 100  CO2 27 27  GLUCOSE 195* 199*  BUN 24* 30*  CREATININE 1.08* 1.16*  CALCIUM 8.7* 8.4*  MG 2.5* 2.2  PHOS 3.4 3.0   Liver Function Tests: Recent Labs    09/01/22 0243  AST 38  ALT 12  ALKPHOS 45  BILITOT 0.9  PROT 6.2*  ALBUMIN 3.4*   No results for input(s): "LIPASE", "AMYLASE" in the last 72 hours. CBC: Recent Labs    09/02/22 0454 09/03/22 0621  WBC 15.0* 9.8  HGB 8.2* 8.1*  HCT 25.3* 24.1*  MCV 98.4 96.8  PLT 132* 118*   Cardiac Enzymes: No results for input(s):  "CKTOTAL", "CKMB", "CKMBINDEX", "TROPONINI" in the last 72 hours. BNP: Invalid input(s): "POCBNP" D-Dimer: No results for input(s): "DDIMER" in the last 72 hours. Hemoglobin A1C: No results for input(s): "HGBA1C" in the last 72 hours. Fasting Lipid Panel: No results for input(s): "CHOL", "HDL", "LDLCALC", "TRIG", "CHOLHDL", "LDLDIRECT" in the last 72 hours. Thyroid Function Tests: Recent Labs    09/01/22 0243  TSH 1.711   Anemia Panel: No results for input(s): "VITAMINB12", "FOLATE", "FERRITIN", "TIBC", "IRON", "RETICCTPCT" in the last 72 hours.   PHYSICAL EXAM General: Well developed, well nourished, in no acute distress HEENT:  Normocephalic and atramatic Neck:  No JVD.  Lungs: Clear bilaterally to auscultation and percussion. Heart: HRRR . Normal S1 and S2 without gallops or murmurs.  Abdomen: Bowel sounds are positive, abdomen soft and non-tender  Msk:  Back normal, normal gait. Normal strength and tone for age. Extremities: No clubbing, cyanosis or edema.   Neuro: Alert and oriented X 3. Psych:  Good affect, responds appropriately  TELEMETRY: atrial fibrillation, HR 70 bpm  ASSESSMENT AND PLAN: Patient resting comfortably in bed. Denies chest pain. Patient complained of "feeling funny" this morning, b/p was low. Will continue to wean patient off Cardizem. Will continue to follow.   Principal Problem:   Periprosthetic fracture  around internal prosthetic right hip joint ([place]) Active Problems:   Atrial fibrillation ([place])   Essential (primary) hypertension   Adult hypothyroidism   Uncontrolled type 2 diabetes mellitus with hyperglycemia, without long-term current use of insulin (HCC)   Stage 3a chronic kidney disease (HCC)   Chronic anticoagulation   History of malignant gastrointestinal stromal tumor (GIST)   Chronic systolic CHF (congestive heart failure) ([place])    Jerlyn Pain, FNP-C 09/03/2022 8:54 AM

## 2022-09-03 NOTE — Inpatient Diabetes Management (Signed)
Inpatient Diabetes Program Recommendations  AACE/ADA: New Consensus Statement on Inpatient Glycemic Control   Target Ranges:  Prepandial:   less than 140 mg/dL      Peak postprandial:   less than 180 mg/dL (1-2 hours)      Critically ill patients:  140 - 180 mg/dL    Latest Reference Range & Units 09/03/22 08:43  Glucose-Capillary 70 - 99 mg/dL 209 (H)    Latest Reference Range & Units 09/02/22 08:55 09/02/22 12:03 09/02/22 16:18 09/02/22 21:28  Glucose-Capillary 70 - 99 mg/dL 190 (H) 238 (H) 235 (H) 264 (H)    Review of Glycemic Control  Diabetes history: DM2 Outpatient Diabetes medications: Januvia 100 mg daily Current orders for Inpatient glycemic control: Novolog 0-15 units TID with meals, Novolog 0-5 units QHS  Inpatient Diabetes Program Recommendations:    Insulin:  Please consider ordering Novolog 3 units TID with meals for meal coverage if patient eats at least 50% of meals.'  Thanks, Barnie Alderman, RN, MSN, [place] Diabetes Coordinator Inpatient Diabetes Program [phone] (Team Pager from 8am to [place])

## 2022-09-03 NOTE — Progress Notes (Signed)
Nutrition Follow-up  DOCUMENTATION CODES:   Not applicable  INTERVENTION:   -D/c Ensure Enlive po BID, each supplement provides 350 kcal and 20 grams of protein.  -Glucerna Shake po TID, each supplement provides 220 kcal and 10 grams of protein  -Continue MVI with minerals daily  NUTRITION DIAGNOSIS:   Increased nutrient needs related to post-op healing as evidenced by estimated needs.  Ongoing  GOAL:   Patient will meet greater than or equal to 90% of their needs  Progressing   MONITOR:   PO intake, Supplement acceptance  REASON FOR ASSESSMENT:   Consult Assessment of nutrition requirement/status, Hip fracture protocol  ASSESSMENT:   Pt with medical history significant for Atrial fibrillation on Eliquis, HTN, history of gastrointestinal stromal tumor, CKD 3a, systolic CHF (EF 61% 01/736), hypothyroidism and non-insulin-dependent type 2 diabetes who presents following an accidental fall after she slipped when she got up to go to the bathroom, landing on her right hip.  1/22- s/p Procedure: Open reduction internal fixation right periprosthetic femur fracture   Reviewed I/O's: -555 ml x 24 hours and -1.8 L since admission  UOP: 1.3 L x 24 hours   Pt unavailable at time of visit. Attempted to speak with pt via call to hospital room phone, however, unable to reach.   Pt with good appetite. Noted meal completions 50-100%. Pt is drinking Ensure supplements. RD will adjust supplement regimen secondary to hyperglycemia and good oral intake.   Plan SNF at discharge.   Medications reviewed and include cardizem, colace, and lasix.   Labs reviewed: Na: 132, CBGS: 209-239 (inpatient orders for glycemic control are 0-15 units insulin aspart TID with meals and 0-5 units insulin aspart daily at bedtime).    Diet Order:   Diet Order             Diet regular Room service appropriate? Yes with Assist; Fluid consistency: Thin  Diet effective now                    EDUCATION NEEDS:   Not appropriate for education at this time  Skin:  Skin Assessment: Reviewed RN Assessment  Last BM:  08/29/22  Height:   Ht Readings from Last 1 Encounters:  08/31/22 '5\' 8"'$  (1.727 m)    Weight:   Wt Readings from Last 1 Encounters:  08/31/22 67.6 kg    Ideal Body Weight:  63.6 kg  BMI:  Body mass index is 22.66 kg/m.  Estimated Nutritional Needs:   Kcal:  1700-1900  Protein:  85-100 grams  Fluid:  >1.7 L    Loistine Chance, RD, LDN, [place] Registered Dietitian II Certified Diabetes Care and Education Specialist Please refer to [facility] for RD and/or RD on-call/weekend/after hours pager

## 2022-09-03 NOTE — Progress Notes (Signed)
Physical Therapy Treatment Patient Details Name: Dawn Burnett MRN: [id number] DOB: November 30, 1928 Today's Date: 09/03/2022   History of Present Illness Pt is a 89 y.o. female with PMH that includes: a-fib, HTN, GI stromal tumor, CKD 3a, systolic CHF (EF 50% 04/3266), hypothyroidism and DM 2 who presents to the ED following an accidental fall after she slipped when she got up to go to the bathroom. Pt diagnosed with right periprosthetic proximal femur fracture and is s/p ORIF. A-fib with RVR and torsade developed after surgery, cleard for PT by cardiology 09/02/22.    PT Comments    Increased time spent due to pt's multiple comorbidities, significant hearing impairment, and education provided to pt and family regarding current LOF, role of PT, review of precautions, and expectations at d/c. Pt required ModA to transfer to edge of bed for 12 minutes. Good demonstration of balance, quick to fatigue, unable to maintain NWB in standing. SpO2 on RA remained above 90%. Pt endorses 7/10 Right hip pain once positioned back to bed, yet facial expression did not appear as though pt was in significant pain. Pt remains very motivated to return to prior independent function. Continue to recommend SNF at d/c.    Recommendations for follow up therapy are one component of a multi-disciplinary discharge planning process, led by the attending physician.  Recommendations may be updated based on patient status, additional functional criteria and insurance authorization.  Follow Up Recommendations  Skilled nursing-short term rehab (<3 hours/day) Can patient physically be transported by private vehicle: No   Assistance Recommended at Discharge Frequent or constant Supervision/Assistance  Patient can return home with the following Two people to help with walking and/or transfers;A lot of help with bathing/dressing/bathroom;Assistance with cooking/housework;Direct supervision/assist for medications management;Assist for  transportation;Help with stairs or ramp for entrance   Equipment Recommendations  Other (comment) (TBD at next venue)    Recommendations for Other Services       Precautions / Restrictions Precautions Precautions: Fall;Posterior Hip Precaution Booklet Issued: Yes (comment) Precaution Comments:  (re-educated pt and family, booklet in room) Restrictions Weight Bearing Restrictions: Yes RLE Weight Bearing: Non weight bearing     Mobility  Bed Mobility Overal bed mobility: Needs Assistance Bed Mobility: Supine to Sit     Supine to sit: Mod assist, HOB elevated (Use of bed rail) Sit to supine: Max assist, +2 for physical assistance   General bed mobility comments: Pt required assist for BLE and trunk control with cues for use of bed rail    Transfers Overall transfer level: Needs assistance Equipment used: None Transfers: Sit to/from Stand Sit to Stand: Total assist (Unable to maintain NWB R LE precautions)                Ambulation/Gait               General Gait Details: Pt unable to move L foot from the floor during attempt at ambulation/shuffling   Stairs             Wheelchair Mobility    Modified Rankin (Stroke Patients Only)       Balance Overall balance assessment: Needs assistance Sitting-balance support: Feet supported, Bilateral upper extremity supported Sitting balance-Leahy Scale: Good Sitting balance - Comments: Pt sat EOB x 12 minutes with supervision.   Standing balance support: Bilateral upper extremity supported, Reliant on assistive device for balance Standing balance-Leahy Scale: Poor  Cognition Arousal/Alertness: Awake/alert Behavior During Therapy: Flat affect Overall Cognitive Status: Difficult to assess                                 General Comments: Pt able to follow simple commands with extra time and cuing, very [facility]        Exercises General Exercises  - Lower Extremity Ankle Circles/Pumps: AROM, Both, 10 reps Heel Slides: AAROM, Right, 10 reps Hip ABduction/ADduction: AAROM, Right, 5 reps    General Comments General comments (skin integrity, edema, etc.): Chronic Right eye condition resulting in bleeding if rubbed gently      Pertinent Vitals/Pain Pain Assessment Pain Assessment: 0-10 Pain Score: 7  Pain Location: Right Hip Pain Descriptors / Indicators: Discomfort Pain Intervention(s): Limited activity within patient's tolerance, Monitored during session, Patient requesting pain meds-RN notified    Home Living                          Prior Function            PT Goals (current goals can now be found in the care plan section) Acute Rehab PT Goals Patient Stated Goal: Get better    Frequency    7X/week      PT Plan Current plan remains appropriate    Co-evaluation              AM-PAC PT "6 Clicks" Mobility   Outcome Measure  Help needed turning from your back to your side while in a flat bed without using bedrails?: A Lot Help needed moving from lying on your back to sitting on the side of a flat bed without using bedrails?: A Lot Help needed moving to and from a bed to a chair (including a wheelchair)?: Total Help needed standing up from a chair using your arms (e.g., wheelchair or bedside chair)?: Total Help needed to walk in hospital room?: Total Help needed climbing 3-5 steps with a railing? : Total 6 Click Score: 8    End of Session   Activity Tolerance: Patient tolerated treatment well Patient left: in bed;with call bell/phone within reach;with bed alarm set;with nursing/sitter in room;with family/visitor present;with SCD's reapplied Nurse Communication: Mobility status;Precautions;Weight bearing status PT Visit Diagnosis: Unsteadiness on feet (R26.81);History of falling (Z91.81);Other abnormalities of gait and mobility (R26.89);Muscle weakness (generalized) (M62.81);Repeated falls  (R29.6)     Time: 1254-1350 PT Time Calculation (min) (ACUTE ONLY): 56 min  Charges:  $Therapeutic Exercise: 8-22 mins $Therapeutic Activity: 38-52 mins                    Mikel Cella, PTA    Josie Dixon 09/03/2022, 2:11 PM

## 2022-09-03 NOTE — Progress Notes (Signed)
   Subjective: 3 Days Post-Op Procedure(s) (LRB): OPEN REDUCTION INTERNAL FIXATION (ORIF) PERIPROSTHETIC FEMORAL SHAFT FRACTURE (Right) Patient denies pain. Daughter at bedside, states that she is sleeping well at night and pain seems to be well-controlled. Improved sore throat. Daughter states she is eating very well with no complications. Plan is to go Skilled nursing facility after hospital stay.  Objective: Vital signs in last 24 hours: Temp:  [98.2 F (36.8 C)-99.1 F (37.3 C)] 98.5 F (36.9 C) (01/25 1300) Pulse Rate:  [58-79] 58 (01/25 1300) Resp:  [16-18] 16 (01/25 1300) BP: (109-161)/(39-76) 127/56 (01/25 1300) SpO2:  [88 %-100 %] 100 % (01/25 1300)  Intake/Output from previous day: 01/24 0701 - 01/25 0700 In: 720 [P.O.:720] Out: 1275 [Urine:1275] Intake/Output this shift: No intake/output data recorded.  Recent Labs    09/01/22 0243 09/02/22 0454 09/03/22 0621  HGB 9.6* 8.2* 8.1*   Recent Labs    09/02/22 0454 09/03/22 0621  WBC 15.0* 9.8  RBC 2.57* 2.49*  HCT 25.3* 24.1*  PLT 132* 118*   Recent Labs    09/02/22 0454 09/03/22 0621  NA 134* 132*  K 4.7 4.5  CL 99 100  CO2 27 27  BUN 24* 30*  CREATININE 1.08* 1.16*  GLUCOSE 195* 199*  CALCIUM 8.7* 8.4*   No results for input(s): "LABPT", "INR" in the last 72 hours.   EXAM General - Patient is Alert, Appropriate, and Oriented Extremity - Neurovascular intact Sensation intact distally Intact pulses distally Dorsiflexion/Plantar flexion intact No cellulitis present Compartment soft Dressing - dressing C/D/I and no drainage Motor Function - intact, moving foot and toes well on exam.   Past Medical History:  Diagnosis Date   Arthritis    Atrial fibrillation (HCC)    CHF (congestive heart failure) (HCC)    Diabetes mellitus without complication (HCC)    GERD (gastroesophageal reflux disease)    Hiatal hernia    Hypertension    Neuropathy    Skin cancer, basal cell    Thyroid disease      Assessment/Plan:   3 Days Post-Op Procedure(s) (LRB): OPEN REDUCTION INTERNAL FIXATION (ORIF) PERIPROSTHETIC FEMORAL SHAFT FRACTURE (Right) Principal Problem:   Periprosthetic fracture around internal prosthetic right hip joint (HCC) Active Problems:   Atrial fibrillation (HCC)   Essential (primary) hypertension   Adult hypothyroidism   Uncontrolled type 2 diabetes mellitus with hyperglycemia, without long-term current use of insulin (HCC)   Stage 3a chronic kidney disease (HCC)   Chronic anticoagulation   History of malignant gastrointestinal stromal tumor (GIST)   Chronic systolic CHF (congestive heart failure) (HCC)  Estimated body mass index is 22.66 kg/m as calculated from the following:   Height as of this encounter: '5\' 8"'$  (1.727 m).   Weight as of this encounter: 67.6 kg. Advance diet Up with therapy Doing well this morning.  No complaints.  Pain well-controlled. Acute post op blood loss anemia -hemoglobin 8.1, stable.  Recheck hemoglobin in the morning Vital signs are stable Incision site appears well with minimal swelling, no drainage.  Follow up with [facility] in 2 weeks for recheck  DVT Prophylaxis - TED hose and SCDs Eliquis Non weight bearing Right lower extremity   T. Rachelle Hora, PA-C [place] 09/03/2022, 1:13 PM

## 2022-09-03 NOTE — TOC Progression Note (Addendum)
Transition of Care [facility]) - Progression Note    Patient Details  Name: Dawn Burnett MRN: [id number] Date of Birth: [birth year]  Transition of Care [facility]) CM/SW [place] Phone Number: 09/03/2022, 11:11 AM  Clinical Narrative:     CSW spoke with patient's daughter [name] regarding bed offers, agreeable to preference of [facility]. Magda Paganini at [facility] updated on choice. Pending dc time frame from MD to determine if insurance authorization should be started for snf.   Update 2:06pm: Auth achieved for 1/26-1/30 [id number] Magda Paganini at [facility] updated  Expected Discharge Plan: [place] Barriers to Discharge: Continued Medical Work up  Expected Discharge Plan and [place] arrangements for the past 2 months: Single Family Home                                       Social Determinants of Health (SDOH) Interventions SDOH Screenings   Food Insecurity: No Food Insecurity (08/30/2022)  Housing: Low Risk  (08/30/2022)  Transportation Needs: No Transportation Needs (08/30/2022)  Utilities: Not At Risk (08/30/2022)  Alcohol Screen: Low Risk  (09/09/2020)  Depression (PHQ2-9): Medium Risk (09/09/2020)  Financial Resource Strain: Low Risk  (01/24/2020)  Physical Activity: Inactive (01/24/2020)  Social Connections: Socially Isolated (01/24/2020)  Stress: No Stress Concern Present (01/24/2020)  Tobacco Use: Low Risk  (09/01/2022)    Readmission Risk Interventions     No data to display

## 2022-09-03 NOTE — Progress Notes (Signed)
[facility] Progress Note  Patient: Dawn Burnett    PIR:[id number]  DOA: 08/30/2022     Date of Service: the patient was seen and examined on 09/03/2022  Chief Complaint  Patient presents with   Hip Pain   Fall   Brief hospital course:  Dawn Burnett is a 89 y.o. female with medical history significant for Atrial fibrillation on Eliquis, HTN, history of gastrointestinal stromal tumor, CKD 3a, systolic CHF (EF 63% 0/1601), hypothyroidism and non-insulin-dependent type 2 diabetes who presents to the ED following an accidental fall after she slipped when she got up to go to the bathroom, landing on her right hip.  She denies hitting anywhere else and experiences pain only in her right hip.  History is provided by daughter at bedside as patient is very hard of hearing. ED course and data review: BP 176/77 with otherwise normal vitals.  Labs notable for glucose 217, hemoglobin 11.5EKG, personally viewed and interpreted showing A-fib at 92 with LBBB.  X-ray showing periprosthetic right proximal femur fracture.  Chest x-ray was clear.  The ED provider spoke with orthopedist, Dr. Karel Jarvis who will take patient to the OR following a 24-hour Eliquis washout.  Hospitalist consulted for admission.     Assessment and Plan:  # Periprosthetic fracture around internal prosthetic right hip joint ([place]) Resumed Eliquis on 1/23 Orthopedic surgery was consulted, plan is for ORIF today Cardiology was consulted for preop clearance, patient remains at moderate to high risk.  Due to comorbidities Continue as needed medication for pain control Continue fall precautions  PT OT eval done recommended SNF placement, TOC is following    A-fib with RVR and torsade developed after surgery on 1/22 H/o Atrial fibrillation (HCC) Chronic anticoagulation, on 1/23 resumed Eliquis, cleared by ortho Continue metoprolol 100 mg POD home dose S/p IV Lopressor and IV Cardizem given, s/p Cardizem IV infusion, gradually weaned  off Magnesium IV 1 g followed by 2 g given due to torsade, cardiology is aware Digoxin was given in the OR, patient was started on digoxin 1.25 mg p.o. daily S/p Cardizem IR 30 mg p.o. q6h, d/c'd on 1/25 as per cardio     Chronic systolic CHF (congestive heart failure) ([place]) Clinically euvolemic, last EF on record 36% in July 2020 Continue digoxin, furosemide, metoprolol, Entresto and spironolactone 08/31/22 TTE shows LVEF 25 to 30%, severely decreased LV function, moderate LV hypertrophy.  Moderate MR Clinically no signs of volume overload    Uncontrolled type 2 diabetes mellitus with hyperglycemia, without long-term current use of insulin (HCC) Sliding scale insulin coverage   History of malignant gastrointestinal stromal tumor (GIST) No acute issues suspected   Stage 3a chronic kidney disease ([place]) Renal function at baseline   Adult hypothyroidism Continue levothyroxine   Essential (primary) hypertension BP controlled.  Continue home antihypertensives   Peripheral neuropathy, continued gabapentin 600 mg p.o. 3 times daily home dose  Bilateral sensorineural hearing loss, very hard of hearing.  Constipation, started laxatives.  Body mass index is 22.66 kg/m.  Nutrition Problem: Increased nutrient needs Etiology: post-op healing Interventions: Interventions: Ensure Enlive (each supplement provides 350kcal and 20 grams of protein), MVI     Diet: Regular diet DVT Prophylaxis: Eliquis  Advance goals of care discussion: DNR  Family Communication: family was present at bedside, at the time of interview.  The pt provided permission to discuss medical plan with the family. Opportunity was given to ask question and all questions were answered satisfactorily.   Disposition:  Pt  is from Home, admitted with fall right femur fracture, s/p ORIF done on 1/22, PT and OT eval done, recommend SNF placement.  Patient is currently stable to discharge when bed will be  available. Discharge to SNF,  when bed will be available.     Subjective: No significant events overnight, patient's heart rate remained well-controlled, denied any chest pain or palpitations, patient was resting comfortably.  Patient's family was at bedside, management plan discussed.   Physical Exam: General: NAD, lying comfortably Appear in no distress, affect appropriate Eyes: PERRLA ENT: Oral Mucosa Clear, moist  Neck: no JVD,  Cardiovascular: Irregular rhythm,  no Murmur,  Respiratory: good respiratory effort, Bilateral Air entry equal and Decreased, no Crackles, no wheezes Abdomen: Bowel Sound present, Soft and no tenderness,  Skin: no rashes Extremities: no Pedal edema, no calf tenderness, s/p right hip ORIF, dressing CDI Neurologic: without any new focal findings Gait not checked due to patient safety concerns  Vitals:   09/03/22 0646 09/03/22 0800 09/03/22 0932 09/03/22 1300  BP: (!) 117/39 (!) 146/46 (!) 161/57 (!) 127/56  Pulse:   79 (!) 58  Resp:  '18 18 16  '$ Temp:   98.7 F (37.1 C) 98.5 F (36.9 C)  TempSrc:   Axillary Axillary  SpO2:   98% 100%  Weight:      Height:        Intake/Output Summary (Last 24 hours) at 09/03/2022 1533 Last data filed at 09/03/2022 1338 Gross per 24 hour  Intake 240 ml  Output 1225 ml  Net -985 ml   Filed Weights   08/30/22 0656 08/31/22 1121  Weight: 67.6 kg 67.6 kg    Data Reviewed: I have personally reviewed and interpreted daily labs, tele strips, imagings as discussed above. I reviewed all nursing notes, pharmacy notes, vitals, pertinent old records I have discussed plan of care as described above with RN and patient/family.  CBC: Recent Labs  Lab 08/30/22 0238 08/31/22 0344 09/01/22 0243 09/02/22 0454 09/03/22 0621  WBC 9.7 9.6 16.3* 15.0* 9.8  NEUTROABS 7.7  --   --   --   --   HGB 11.5* 10.7* 9.6* 8.2* 8.1*  HCT 35.6* 32.2* 29.2* 25.3* 24.1*  MCV 98.6 95.8 96.7 98.4 96.8  PLT 131* 145* 113* 132* 118*    Basic Metabolic Panel: Recent Labs  Lab 08/30/22 0238 08/31/22 0344 09/01/22 0243 09/02/22 0454 09/03/22 0621  NA 138 136 136 134* 132*  K 4.2 4.4 4.5 4.7 4.5  CL 103 98 100 99 100  CO2 '25 29 23 27 27  '$ GLUCOSE 217* 275* 280* 195* 199*  BUN 24* 24* 21 24* 30*  CREATININE 0.95 1.12* 0.99 1.08* 1.16*  CALCIUM 9.0 9.1 8.6* 8.7* 8.4*  MG  --  1.8 1.8 2.5* 2.2  PHOS  --  4.0 3.8 3.4 3.0    Studies: No results found.  Scheduled Meds:  acetaminophen  1,000 mg Oral Q12H   apixaban  2.5 mg Oral BID   atorvastatin  20 mg Oral QHS   bisacodyl  10 mg Oral Once   bisacodyl  10 mg Oral QHS   digoxin  0.125 mg Oral Daily   diltiazem  30 mg Oral Q12H   feeding supplement (GLUCERNA SHAKE)  237 mL Oral TID BM   furosemide  20 mg Oral Daily   gabapentin  300 mg Oral TID WC   imipramine  25 mg Oral QHS   insulin aspart  0-15 Units Subcutaneous TID WC  insulin aspart  0-5 Units Subcutaneous QHS   levothyroxine  50 mcg Oral Q0600   metoprolol succinate  100 mg Oral Daily   mirabegron ER  50 mg Oral QHS   multivitamin with minerals  1 tablet Oral Daily   pantoprazole  40 mg Oral Daily   polyethylene glycol  17 g Oral BID   sacubitril-valsartan  1 tablet Oral BID   spironolactone  25 mg Oral Daily   Continuous Infusions:   PRN Meds: acetaminophen, bisacodyl, menthol-cetylpyridinium **OR** phenol, metoCLOPramide **OR** metoCLOPramide (REGLAN) injection, ondansetron **OR** ondansetron (ZOFRAN) IV, polyvinyl alcohol  Time spent: 40 minutes  Author: Val Riles. MD [facility] 09/03/2022 3:33 PM  To reach On-call, see care teams to locate the attending and reach out to them via [email or web address]. If 7PM-7AM, please contact night-coverage If you still have difficulty reaching the attending provider, please page the [facility] (Director on Call) for [facility] on amion for assistance.

## 2022-09-04 DIAGNOSIS — M9701XA Periprosthetic fracture around internal prosthetic right hip joint, initial encounter: Secondary | ICD-10-CM | POA: Diagnosis not present

## 2022-09-04 LAB — PHOSPHORUS: Phosphorus: 2.7 mg/dL (ref 2.5–4.6)

## 2022-09-04 LAB — CBC
HCT: 26.8 % — ABNORMAL LOW (ref 36.0–46.0)
Hemoglobin: 8.7 g/dL — ABNORMAL LOW (ref 12.0–15.0)
MCH: 31.6 pg (ref 26.0–34.0)
MCHC: 32.5 g/dL (ref 30.0–36.0)
MCV: 97.5 fL (ref 80.0–100.0)
Platelets: 180 10*3/uL (ref 150–400)
RBC: 2.75 MIL/uL — ABNORMAL LOW (ref 3.87–5.11)
RDW: 13.1 % (ref 11.5–15.5)
WBC: 11.3 10*3/uL — ABNORMAL HIGH (ref 4.0–10.5)
nRBC: 0 % (ref 0.0–0.2)

## 2022-09-04 LAB — BASIC METABOLIC PANEL
Anion gap: 9 (ref 5–15)
Anion gap: 10 (ref 5–15)
BUN: 29 mg/dL — ABNORMAL HIGH (ref 8–23)
BUN: 31 mg/dL — ABNORMAL HIGH (ref 8–23)
CO2: 27 mmol/L (ref 22–32)
CO2: 26 mmol/L (ref 22–32)
Calcium: 8.6 mg/dL — ABNORMAL LOW (ref 8.9–10.3)
Calcium: 8.4 mg/dL — ABNORMAL LOW (ref 8.9–10.3)
Chloride: 100 mmol/L (ref 98–111)
Chloride: 97 mmol/L — ABNORMAL LOW (ref 98–111)
Creatinine, Ser: 1.05 mg/dL — ABNORMAL HIGH (ref 0.44–1.00)
Creatinine, Ser: 1.01 mg/dL — ABNORMAL HIGH (ref 0.44–1.00)
GFR, Estimated: 50 mL/min — ABNORMAL LOW (ref >60)
GFR, Estimated: 53 mL/min — ABNORMAL LOW (ref >60)
Glucose, Bld: 232 mg/dL — ABNORMAL HIGH (ref 70–99)
Glucose, Bld: 239 mg/dL — ABNORMAL HIGH (ref 70–99)
Potassium: 5.3 mmol/L — ABNORMAL HIGH (ref 3.5–5.1)
Potassium: 5.1 mmol/L (ref 3.5–5.1)
Sodium: 136 mmol/L (ref 135–145)
Sodium: 133 mmol/L — ABNORMAL LOW (ref 135–145)

## 2022-09-04 LAB — GLUCOSE, CAPILLARY
Glucose-Capillary: 230 mg/dL — ABNORMAL HIGH (ref 70–99)
Glucose-Capillary: 260 mg/dL — ABNORMAL HIGH (ref 70–99)
Glucose-Capillary: 212 mg/dL — ABNORMAL HIGH (ref 70–99)
Glucose-Capillary: 163 mg/dL — ABNORMAL HIGH (ref 70–99)

## 2022-09-04 LAB — MAGNESIUM: Magnesium: 1.9 mg/dL (ref 1.7–2.4)

## 2022-09-04 MED ORDER — DIGOXIN 0.25 MG/ML IJ SOLN
0.1250 mg | Freq: Once | INTRAMUSCULAR | Status: AC
  Administered 2022-09-04: 0.125 mg via INTRAVENOUS
  Filled 2022-09-04: qty 2

## 2022-09-04 MED ORDER — INSULIN ASPART 100 UNIT/ML IJ SOLN
3.0000 [IU] | Freq: Three times a day (TID) | INTRAMUSCULAR | Status: DC
  Administered 2022-09-04 (×2): 3 [IU] via SUBCUTANEOUS
  Filled 2022-09-04 (×2): qty 1

## 2022-09-04 MED ORDER — FLEET ENEMA 7-19 GM/118ML RE ENEM: 1.0000 | ENEMA | Freq: Once | RECTAL | Status: DC

## 2022-09-04 MED ORDER — KETOROLAC TROMETHAMINE 15 MG/ML IJ SOLN
7.5000 mg | Freq: Once | INTRAMUSCULAR | Status: AC
  Administered 2022-09-04: 7.5 mg via INTRAVENOUS
  Filled 2022-09-04: qty 1

## 2022-09-04 MED ORDER — SACUBITRIL-VALSARTAN 97-103 MG PO TABS
1.0000 | ORAL_TABLET | Freq: Two times a day (BID) | ORAL | Status: DC
  Administered 2022-09-05 – 2022-09-07 (×5): 1 via ORAL
  Filled 2022-09-04 (×5): qty 1

## 2022-09-04 MED ORDER — TRAMADOL HCL 50 MG PO TABS
50.0000 mg | ORAL_TABLET | Freq: Four times a day (QID) | ORAL | Status: DC | PRN
  Administered 2022-09-04 – 2022-09-06 (×5): 50 mg via ORAL
  Filled 2022-09-04 (×6): qty 1

## 2022-09-04 MED ORDER — SODIUM ZIRCONIUM CYCLOSILICATE 10 G PO PACK
10.0000 g | PACK | Freq: Three times a day (TID) | ORAL | Status: AC
  Administered 2022-09-04 (×2): 10 g via ORAL
  Filled 2022-09-04 (×2): qty 1

## 2022-09-04 MED ORDER — BISACODYL 10 MG RE SUPP
10.0000 mg | Freq: Once | RECTAL | Status: AC
  Administered 2022-09-04: 10 mg via RECTAL
  Filled 2022-09-04: qty 1

## 2022-09-04 NOTE — Progress Notes (Signed)
   Subjective: 4 Days Post-Op Procedure(s) (LRB): OPEN REDUCTION INTERNAL FIXATION (ORIF) PERIPROSTHETIC FEMORAL SHAFT FRACTURE (Right) Patient sleeping. Easily awakened. No pain but soreness to right thigh. No complaints. Plan is to go Skilled nursing facility after hospital stay.  Objective: Vital signs in last 24 hours: Temp:  [97.8 F (36.6 C)-99.4 F (37.4 C)] 98.2 F (36.8 C) (01/26 0610) Pulse Rate:  [60-99] 88 (01/26 1245) Resp:  [15-20] 20 (01/26 1245) BP: (130-161)/(50-65) 146/60 (01/26 1245) SpO2:  [91 %-100 %] 97 % (01/26 1245)  Intake/Output from previous day: 01/25 0701 - 01/26 0700 In: -  Out: 2050 [Urine:2050] Intake/Output this shift: Total I/O In: -  Out: 700 [Urine:700]  Recent Labs    09/02/22 0454 09/03/22 0621 09/04/22 0626  HGB 8.2* 8.1* 8.7*   Recent Labs    09/03/22 0621 09/04/22 0626  WBC 9.8 11.3*  RBC 2.49* 2.75*  HCT 24.1* 26.8*  PLT 118* 180   Recent Labs    09/04/22 0626 09/04/22 1206  NA 136 133*  K 5.3* 5.1  CL 100 97*  CO2 27 26  BUN 29* 31*  CREATININE 1.05* 1.01*  GLUCOSE 232* 239*  CALCIUM 8.6* 8.4*   No results for input(s): "LABPT", "INR" in the last 72 hours.   EXAM General - Patient is Alert, Appropriate, and Oriented Extremity - Neurovascular intact Sensation intact distally Intact pulses distally Dorsiflexion/Plantar flexion intact No cellulitis present Compartment soft Dressing - dressing C/D/I and no drainage Motor Function - intact, moving foot and toes well on exam.   Past Medical History:  Diagnosis Date   Arthritis    Atrial fibrillation (HCC)    CHF (congestive heart failure) (HCC)    Diabetes mellitus without complication (HCC)    GERD (gastroesophageal reflux disease)    Hiatal hernia    Hypertension    Neuropathy    Skin cancer, basal cell    Thyroid disease     Assessment/Plan:   4 Days Post-Op Procedure(s) (LRB): OPEN REDUCTION INTERNAL FIXATION (ORIF) PERIPROSTHETIC FEMORAL  SHAFT FRACTURE (Right) Principal Problem:   Periprosthetic fracture around internal prosthetic right hip joint (HCC) Active Problems:   Atrial fibrillation (HCC)   Essential (primary) hypertension   Adult hypothyroidism   Uncontrolled type 2 diabetes mellitus with hyperglycemia, without long-term current use of insulin (HCC)   Stage 3a chronic kidney disease (HCC)   Chronic anticoagulation   History of malignant gastrointestinal stromal tumor (GIST)   Chronic systolic CHF (congestive heart failure) (HCC)  Estimated body mass index is 22.66 kg/m as calculated from the following:   Height as of this encounter: '5\' 8"'$  (1.727 m).   Weight as of this encounter: 67.6 kg. Advance diet Up with therapy Doing well. Pain well-controlled. Acute post op blood loss anemia -hemoglobin 8.7, stable, trending up.  Vital signs are stable Incision site appears well with minimal swelling, no drainage.  Follow up with [facility] in 2 weeks for recheck  DVT Prophylaxis - TED hose and SCDs Eliquis Non weight bearing Right lower extremity   T. Rachelle Hora, PA-C [place] 09/04/2022, 1:12 PM

## 2022-09-04 NOTE — Progress Notes (Signed)
PT Cancellation Note  Patient Details Name: Dawn Burnett MRN: [id number] DOB: 04/09/1929   Cancelled Treatment:     Pt unable to medically tolerate skilled PT this date. Overnight A-fib and tachy, currently very lethargic, warm to touch, and flushed. Family at bedside. Will reassess for tolerance and continued PT per POC.    Josie Dixon 09/04/2022, 3:12 PM

## 2022-09-04 NOTE — TOC Progression Note (Signed)
Transition of Care [facility]) - Progression Note    Patient Details  Name: Dawn Burnett MRN: [id number] Date of Birth: 1929/08/08  [facility] CM/SW [place], [place] Phone Number: 09/04/2022, 2:31 PM  Clinical Narrative:     Per MD patient will not dc today due to afib, Magda Paganini at [facility] updated. Patient has auth until 1/30 pending medical readiness to dc to [facility].    Expected Discharge Plan: [place] Barriers to Discharge: Continued Medical Work up  Expected Discharge Plan and Services       Living arrangements for the past 2 months: Single Family Home                                       Social Determinants of Health (SDOH) Interventions SDOH Screenings   Food Insecurity: No Food Insecurity (08/30/2022)  Housing: Low Risk  (08/30/2022)  Transportation Needs: No Transportation Needs (08/30/2022)  Utilities: Not At Risk (08/30/2022)  Alcohol Screen: Low Risk  (09/09/2020)  Depression (PHQ2-9): Medium Risk (09/09/2020)  Financial Resource Strain: Low Risk  (01/24/2020)  Physical Activity: Inactive (01/24/2020)  Social Connections: Socially Isolated (01/24/2020)  Stress: No Stress Concern Present (01/24/2020)  Tobacco Use: Low Risk  (09/01/2022)    Readmission Risk Interventions     No data to display

## 2022-09-04 NOTE — Care Management Important Message (Signed)
Important Message  Patient Details  Name: Dawn Burnett MRN: [id number] Date of Birth: [birth year]   Medicare Important Message Given:  Yes     [name] 09/04/2022, 12:16 PM

## 2022-09-04 NOTE — Progress Notes (Signed)
SUBJECTIVe, has hip pain   Vitals:   09/03/22 2100 09/04/22 0003 09/04/22 0452 09/04/22 0610  BP: 130/64 (!) 143/65 (!) 156/55 (!) 161/59  Pulse: 84 97 85 99  Resp: '19 19 20 20  '$ Temp: 98 F (36.7 C) 97.8 F (36.6 C) 99.4 F (37.4 C) 98.2 F (36.8 C)  TempSrc: Axillary Axillary Oral   SpO2: 95% 93% 95% 91%  Weight:      Height:        Intake/Output Summary (Last 24 hours) at 09/04/2022 0832 Last data filed at 09/04/2022 0400 Gross per 24 hour  Intake --  Output 2050 ml  Net -2050 ml    LABS: Basic Metabolic Panel: Recent Labs    09/03/22 0621 09/04/22 0626  NA 132* 136  K 4.5 5.3*  CL 100 100  CO2 27 27  GLUCOSE 199* 232*  BUN 30* 29*  CREATININE 1.16* 1.05*  CALCIUM 8.4* 8.6*  MG 2.2 1.9  PHOS 3.0 2.7   Liver Function Tests: No results for input(s): "AST", "ALT", "ALKPHOS", "BILITOT", "PROT", "ALBUMIN" in the last 72 hours. No results for input(s): "LIPASE", "AMYLASE" in the last 72 hours. CBC: Recent Labs    09/03/22 0621 09/04/22 0626  WBC 9.8 11.3*  HGB 8.1* 8.7*  HCT 24.1* 26.8*  MCV 96.8 97.5  PLT 118* 180   Cardiac Enzymes: No results for input(s): "CKTOTAL", "CKMB", "CKMBINDEX", "TROPONINI" in the last 72 hours. BNP: Invalid input(s): "POCBNP" D-Dimer: No results for input(s): "DDIMER" in the last 72 hours. Hemoglobin A1C: No results for input(s): "HGBA1C" in the last 72 hours. Fasting Lipid Panel: No results for input(s): "CHOL", "HDL", "LDLCALC", "TRIG", "CHOLHDL", "LDLDIRECT" in the last 72 hours. Thyroid Function Tests: No results for input(s): "TSH", "T4TOTAL", "T3FREE", "THYROIDAB" in the last 72 hours.  Invalid input(s): "FREET3" Anemia Panel: No results for input(s): "VITAMINB12", "FOLATE", "FERRITIN", "TIBC", "IRON", "RETICCTPCT" in the last 72 hours.   PHYSICAL EXAM General: Well developed, well nourished, in no acute distress HEENT:  Normocephalic and atramatic Neck:  No JVD.  Lungs: Clear bilaterally to auscultation and  percussion. Heart: HRRR . Normal S1 and S2 without gallops or murmurs.  Abdomen: Bowel sounds are positive, abdomen soft and non-tender  Msk:  Back normal, normal gait. Normal strength and tone for age. Extremities: No clubbing, cyanosis or edema.   Neuro: Alert and oriented X 3. Psych:  Good affect, responds appropriately  TELEMETRY: Atrial fib 112/min rate  ASSESSMENT AND PLAN: Atrial fib with RVR s/p hip replacement, waiting for placement. Give extra dose digoxin 0.25 in today.  Principal Problem:   Periprosthetic fracture around internal prosthetic right hip joint (HCC) Active Problems:   Atrial fibrillation ([place])   Essential (primary) hypertension   Adult hypothyroidism   Uncontrolled type 2 diabetes mellitus with hyperglycemia, without long-term current use of insulin (HCC)   Stage 3a chronic kidney disease (HCC)   Chronic anticoagulation   History of malignant gastrointestinal stromal tumor (GIST)   Chronic systolic CHF (congestive heart failure) (HCC)    Breland Elders A, MD, [facility] 09/04/2022 8:32 AM

## 2022-09-04 NOTE — Progress Notes (Signed)
[facility] Progress Note  Patient: Dawn Burnett    ZMO:[id number]  DOA: 08/30/2022     Date of Service: the patient was seen and examined on 09/04/2022  Chief Complaint  Patient presents with   Hip Pain   Fall   Brief hospital course:  Dawn Burnett is a 89 y.o. female with medical history significant for Atrial fibrillation on Eliquis, HTN, history of gastrointestinal stromal tumor, CKD 3a, systolic CHF (EF 03% 12/4654), hypothyroidism and non-insulin-dependent type 2 diabetes who presents to the ED following an accidental fall after she slipped when she got up to go to the bathroom, landing on her right hip.  She denies hitting anywhere else and experiences pain only in her right hip.  History is provided by daughter at bedside as patient is very hard of hearing. ED course and data review: BP 176/77 with otherwise normal vitals.  Labs notable for glucose 217, hemoglobin 11.5EKG, personally viewed and interpreted showing A-fib at 92 with LBBB.  X-ray showing periprosthetic right proximal femur fracture.  Chest x-ray was clear.  The ED provider spoke with orthopedist, Dr. Karel Jarvis who will take patient to the OR following a 24-hour Eliquis washout.  Hospitalist consulted for admission.     Assessment and Plan:  # Periprosthetic fracture around internal prosthetic right hip joint ([place]) Resumed Eliquis on 1/23 Orthopedic surgery was consulted, plan is for ORIF today Cardiology was consulted for preop clearance, patient remains at moderate to high risk.  Due to comorbidities Continue as needed medication for pain control Continue fall precautions  PT OT eval done recommended SNF placement, TOC is following    A-fib with RVR and torsade developed after surgery on 1/22 H/o Atrial fibrillation (HCC) Chronic anticoagulation, on 1/23 resumed Eliquis, cleared by ortho Continue metoprolol 100 mg POD home dose S/p IV Lopressor and IV Cardizem given, s/p Cardizem IV infusion, gradually weaned  off Magnesium IV 1 g followed by 2 g given due to torsade, cardiology is aware Digoxin was given in the OR, patient was started on digoxin 1.25 mg p.o. daily S/p Cardizem IR 30 mg p.o. q6h, d/c'd on 1/25 as per cardio 1/25 patient was started back on Cardizem 30 mg p.o. every 12 hourly 1/26 patient was given digoxin 0.125 mg IV one-time dose as per cardiology due to persistent A-fib with RVR   Hyperkalemia 1/26 Potassium 5.3--5.1  Discontinue spironolactone, skip the dose of Entresto today Lokelma 10 g x 2 doses given Continue low potassium diet and monitor potassium level daily    Chronic systolic CHF (congestive heart failure) ([place]) Clinically euvolemic, last EF on record 36% in July 2020 Continue digoxin, furosemide, metoprolol, Entresto and spironolactone 08/31/22 TTE shows LVEF 25 to 30%, severely decreased LV function, moderate LV hypertrophy.  Moderate MR Clinically no signs of volume overload    Uncontrolled type 2 diabetes mellitus with hyperglycemia, without long-term current use of insulin (HCC) Sliding scale insulin coverage   History of malignant gastrointestinal stromal tumor (GIST) No acute issues suspected   Stage 3a chronic kidney disease ([place]) Renal function at baseline   Adult hypothyroidism Continue levothyroxine   Essential (primary) hypertension BP controlled.  Continue home antihypertensives   Peripheral neuropathy, continued gabapentin 600 mg p.o. 3 times daily home dose  Bilateral sensorineural hearing loss, very hard of hearing.  Constipation, started laxatives. 1/26 no BM, Dulcolax suppository one-time dose ordered, followed by Fleet enema if no BM   Body mass index is 22.66 kg/m.  Nutrition Problem: Increased  nutrient needs Etiology: post-op healing Interventions: Interventions: Ensure Enlive (each supplement provides 350kcal and 20 grams of protein), MVI     Diet: Regular diet DVT Prophylaxis: Eliquis  Advance goals of care  discussion: DNR  Family Communication: family was present at bedside, at the time of interview.  The pt provided permission to discuss medical plan with the family. Opportunity was given to ask question and all questions were answered satisfactorily.   Disposition:  Pt is from Home, admitted with fall right femur fracture, s/p ORIF done on 1/22, PT and OT eval done, recommend SNF placement.  Patient is currently stable to discharge when bed will be available. Discharge to SNF,  when bed will be available.     Subjective: No significant events overnight, patient is complaining of neuropathic pain in the bilateral lower extremities no any other complaints. Per family patient was in A-fib with RVR and heart rate was in 140s and 150s. So we will continue to monitor on telemetry until heart rate is stable As per family patient has not had BM in several days, agreed with suppository followed by enema.    Physical Exam: General: NAD, lying comfortably Appear in no distress, affect appropriate Eyes: PERRLA ENT: Oral Mucosa Clear, moist  Neck: no JVD,  Cardiovascular: Irregular rhythm,  no Murmur,  Respiratory: good respiratory effort, Bilateral Air entry equal and Decreased, no Crackles, no wheezes Abdomen: Bowel Sound present, Soft and no tenderness,  Skin: no rashes Extremities: no Pedal edema, no calf tenderness, s/p right hip ORIF, dressing CDI Neurologic: without any new focal findings Gait not checked due to patient safety concerns  Vitals:   09/04/22 0452 09/04/22 0610 09/04/22 1245 09/04/22 1620  BP: (!) 156/55 (!) 161/59 (!) 146/60 130/66  Pulse: 85 99 88 79  Resp: '20 20 20 19  '$ Temp: 99.4 F (37.4 C) 98.2 F (36.8 C)  99.7 F (37.6 C)  TempSrc: Oral   Oral  SpO2: 95% 91% 97% 98%  Weight:      Height:        Intake/Output Summary (Last 24 hours) at 09/04/2022 1635 Last data filed at 09/04/2022 1106 Gross per 24 hour  Intake --  Output 1700 ml  Net -1700 ml   Filed  Weights   08/30/22 0656 08/31/22 1121  Weight: 67.6 kg 67.6 kg    Data Reviewed: I have personally reviewed and interpreted daily labs, tele strips, imagings as discussed above. I reviewed all nursing notes, pharmacy notes, vitals, pertinent old records I have discussed plan of care as described above with RN and patient/family.  CBC: Recent Labs  Lab 08/30/22 0238 08/31/22 0344 09/01/22 0243 09/02/22 0454 09/03/22 0621 09/04/22 0626  WBC 9.7 9.6 16.3* 15.0* 9.8 11.3*  NEUTROABS 7.7  --   --   --   --   --   HGB 11.5* 10.7* 9.6* 8.2* 8.1* 8.7*  HCT 35.6* 32.2* 29.2* 25.3* 24.1* 26.8*  MCV 98.6 95.8 96.7 98.4 96.8 97.5  PLT 131* 145* 113* 132* 118* 035   Basic Metabolic Panel: Recent Labs  Lab 08/31/22 0344 09/01/22 0243 09/02/22 0454 09/03/22 0621 09/04/22 0626 09/04/22 1206  NA 136 136 134* 132* 136 133*  K 4.4 4.5 4.7 4.5 5.3* 5.1  CL 98 100 99 100 100 97*  CO2 '29 23 27 27 27 26  '$ GLUCOSE 275* 280* 195* 199* 232* 239*  BUN 24* 21 24* 30* 29* 31*  CREATININE 1.12* 0.99 1.08* 1.16* 1.05* 1.01*  CALCIUM 9.1 8.6*  8.7* 8.4* 8.6* 8.4*  MG 1.8 1.8 2.5* 2.2 1.9  --   PHOS 4.0 3.8 3.4 3.0 2.7  --     Studies: No results found.  Scheduled Meds:  acetaminophen  1,000 mg Oral Q12H   apixaban  2.5 mg Oral BID   atorvastatin  20 mg Oral QHS   bisacodyl  10 mg Oral QHS   bisacodyl  10 mg Rectal Once   digoxin  0.125 mg Oral Daily   diltiazem  30 mg Oral Q12H   feeding supplement (GLUCERNA SHAKE)  237 mL Oral TID BM   furosemide  20 mg Oral Daily   gabapentin  300 mg Oral TID WC   insulin aspart  0-15 Units Subcutaneous TID WC   insulin aspart  0-5 Units Subcutaneous QHS   insulin aspart  3 Units Subcutaneous TID WC   levothyroxine  50 mcg Oral Q0600   metoprolol succinate  100 mg Oral Daily   mirabegron ER  50 mg Oral QHS   multivitamin with minerals  1 tablet Oral Daily   pantoprazole  40 mg Oral Daily   polyethylene glycol  17 g Oral BID   [START ON 09/05/2022]  sacubitril-valsartan  1 tablet Oral BID   sodium phosphate  1 enema Rectal Once   sodium zirconium cyclosilicate  10 g Oral TID   Continuous Infusions:   PRN Meds: acetaminophen, bisacodyl, menthol-cetylpyridinium **OR** phenol, metoCLOPramide **OR** metoCLOPramide (REGLAN) injection, ondansetron **OR** ondansetron (ZOFRAN) IV, polyvinyl alcohol  Time spent: 50 minutes  Author: Val Riles. MD [facility] 09/04/2022 4:35 PM  To reach On-call, see care teams to locate the attending and reach out to them via [email or web address]. If 7PM-7AM, please contact night-coverage If you still have difficulty reaching the attending provider, please page the [facility] (Director on Call) for [facility] on amion for assistance.

## 2022-09-05 DIAGNOSIS — N1831 Chronic kidney disease, stage 3a: Secondary | ICD-10-CM

## 2022-09-05 DIAGNOSIS — I5022 Chronic systolic (congestive) heart failure: Secondary | ICD-10-CM | POA: Diagnosis not present

## 2022-09-05 DIAGNOSIS — Z7901 Long term (current) use of anticoagulants: Secondary | ICD-10-CM | POA: Diagnosis not present

## 2022-09-05 DIAGNOSIS — I482 Chronic atrial fibrillation, unspecified: Secondary | ICD-10-CM | POA: Diagnosis not present

## 2022-09-05 LAB — CBC
HCT: 28.4 % — ABNORMAL LOW (ref 36.0–46.0)
Hemoglobin: 9.3 g/dL — ABNORMAL LOW (ref 12.0–15.0)
MCH: 31.5 pg (ref 26.0–34.0)
MCHC: 32.7 g/dL (ref 30.0–36.0)
MCV: 96.3 fL (ref 80.0–100.0)
Platelets: 229 10*3/uL (ref 150–400)
RBC: 2.95 MIL/uL — ABNORMAL LOW (ref 3.87–5.11)
RDW: 13.2 % (ref 11.5–15.5)
WBC: 13.8 10*3/uL — ABNORMAL HIGH (ref 4.0–10.5)
nRBC: 0 % (ref 0.0–0.2)

## 2022-09-05 LAB — BASIC METABOLIC PANEL
Anion gap: 11 (ref 5–15)
BUN: 31 mg/dL — ABNORMAL HIGH (ref 8–23)
CO2: 26 mmol/L (ref 22–32)
Calcium: 8.4 mg/dL — ABNORMAL LOW (ref 8.9–10.3)
Chloride: 95 mmol/L — ABNORMAL LOW (ref 98–111)
Creatinine, Ser: 1.05 mg/dL — ABNORMAL HIGH (ref 0.44–1.00)
GFR, Estimated: 50 mL/min — ABNORMAL LOW (ref >60)
Glucose, Bld: 244 mg/dL — ABNORMAL HIGH (ref 70–99)
Potassium: 5.3 mmol/L — ABNORMAL HIGH (ref 3.5–5.1)
Sodium: 132 mmol/L — ABNORMAL LOW (ref 135–145)

## 2022-09-05 LAB — GLUCOSE, CAPILLARY
Glucose-Capillary: 265 mg/dL — ABNORMAL HIGH (ref 70–99)
Glucose-Capillary: 179 mg/dL — ABNORMAL HIGH (ref 70–99)
Glucose-Capillary: 153 mg/dL — ABNORMAL HIGH (ref 70–99)
Glucose-Capillary: 152 mg/dL — ABNORMAL HIGH (ref 70–99)

## 2022-09-05 LAB — PHOSPHORUS: Phosphorus: 3.2 mg/dL (ref 2.5–4.6)

## 2022-09-05 LAB — MAGNESIUM: Magnesium: 2 mg/dL (ref 1.7–2.4)

## 2022-09-05 MED ORDER — SODIUM ZIRCONIUM CYCLOSILICATE 10 G PO PACK
10.0000 g | PACK | Freq: Once | ORAL | Status: AC
  Administered 2022-09-05: 10 g via ORAL
  Filled 2022-09-05: qty 1

## 2022-09-05 MED ORDER — ACETAMINOPHEN 500 MG PO TABS
1000.0000 mg | ORAL_TABLET | Freq: Three times a day (TID) | ORAL | Status: DC
  Administered 2022-09-05 – 2022-09-07 (×7): 1000 mg via ORAL
  Filled 2022-09-05 (×7): qty 2

## 2022-09-05 MED ORDER — SODIUM ZIRCONIUM CYCLOSILICATE 5 G PO PACK
5.0000 g | PACK | Freq: Once | ORAL | Status: DC
  Filled 2022-09-05: qty 1

## 2022-09-05 MED ORDER — DILTIAZEM HCL 30 MG PO TABS
30.0000 mg | ORAL_TABLET | Freq: Three times a day (TID) | ORAL | Status: DC
  Administered 2022-09-05 – 2022-09-07 (×7): 30 mg via ORAL
  Filled 2022-09-05 (×8): qty 1

## 2022-09-05 MED ORDER — INSULIN ASPART 100 UNIT/ML IJ SOLN
6.0000 [IU] | Freq: Three times a day (TID) | INTRAMUSCULAR | Status: DC
  Administered 2022-09-05 – 2022-09-06 (×3): 6 [IU] via SUBCUTANEOUS
  Filled 2022-09-05 (×3): qty 1

## 2022-09-05 NOTE — Plan of Care (Signed)
  Problem: Education: Goal: Knowledge of General Education information will improve Description: Including pain rating scale, medication(s)/side effects and non-pharmacologic comfort measures Outcome: Progressing   Problem: Health Behavior/Discharge Planning: Goal: Ability to manage health-related needs will improve Outcome: Not Progressing   Problem: Clinical Measurements: Goal: Ability to maintain clinical measurements within normal limits will improve Outcome: Progressing Goal: Will remain free from infection Outcome: Progressing Goal: Diagnostic test results will improve Outcome: Progressing Goal: Respiratory complications will improve Outcome: Progressing Goal: Cardiovascular complication will be avoided Outcome: Progressing   Problem: Activity: Goal: Risk for activity intolerance will decrease Outcome: Not Progressing   Problem: Nutrition: Goal: Adequate nutrition will be maintained Outcome: Progressing   Problem: Coping: Goal: Level of anxiety will decrease Outcome: Progressing   Problem: Elimination: Goal: Will not experience complications related to bowel motility Outcome: Progressing Goal: Will not experience complications related to urinary retention Outcome: Progressing   Problem: Pain Managment: Goal: General experience of comfort will improve Outcome: Progressing   Problem: Safety: Goal: Ability to remain free from injury will improve Outcome: Progressing   Problem: Skin Integrity: Goal: Risk for impaired skin integrity will decrease Outcome: Progressing   Problem: Activity: Goal: Ability to ambulate and perform ADLs will improve Outcome: Not Progressing   Problem: Clinical Measurements: Goal: Postoperative complications will be avoided or minimized Outcome: Progressing   Problem: Self-Concept: Goal: Ability to maintain and perform role responsibilities to the fullest extent possible will improve Outcome: Progressing   Problem: Pain  Management: Goal: Pain level will decrease Outcome: Progressing

## 2022-09-05 NOTE — Progress Notes (Signed)
[facility] Progress Note  Patient: Dawn Burnett    AYT:[id number]  DOA: 08/30/2022     Date of Service: the patient was seen and examined on 09/05/2022  Chief Complaint  Patient presents with   Hip Pain   Fall   Brief hospital course:  ARLANA CANIZALES is a 89 y.o. female with medical history significant for Atrial fibrillation on Eliquis, HTN, history of gastrointestinal stromal tumor, CKD 3a, systolic CHF (EF 35% 12/7320), hypothyroidism and non-insulin-dependent type 2 diabetes who presents to the ED following an accidental fall after she slipped when she got up to go to the bathroom, landing on her right hip.  She denies hitting anywhere else and experiences pain only in her right hip.  History is provided by daughter at bedside as patient is very hard of hearing. ED course and data review: BP 176/77 with otherwise normal vitals.  Labs notable for glucose 217, hemoglobin 11.5EKG, personally viewed and interpreted showing A-fib at 92 with LBBB.  X-ray showing periprosthetic right proximal femur fracture.  Chest x-ray was clear.  The ED provider spoke with orthopedist, Dr. Karel Jarvis who will take patient to the OR following a 24-hour Eliquis washout.  Hospitalist consulted for admission.     Assessment and Plan:  # Periprosthetic fracture around internal prosthetic right hip joint ([place]) Resumed Eliquis on 1/23 Orthopedic surgery was consulted, plan is for ORIF today Cardiology was consulted for preop clearance, patient remains at moderate to high risk.  Due to comorbidities Continue as needed medication for pain control Continue fall precautions  PT OT eval done recommended SNF placement, TOC is following    A-fib with RVR and torsade developed after surgery on 1/22 H/o Atrial fibrillation (HCC) Chronic anticoagulation, on 1/23 resumed Eliquis, cleared by ortho Continue metoprolol 100 mg POD home dose S/p IV Lopressor and IV Cardizem given, s/p Cardizem IV infusion, gradually weaned  off Magnesium IV 1 g followed by 2 g given due to torsade, cardiology is aware Digoxin was given in the OR, patient was started on digoxin 1.25 mg p.o. daily S/p Cardizem IR 30 mg p.o. q6h, d/c'd on 1/25 as per cardio 1/25 patient was started back on Cardizem 30 mg p.o. every 12 hourly 1/26 patient was given digoxin 0.125 mg IV one-time dose as per cardiology due to persistent A-fib with RVR 1/27 HR on monitor 90's to 110's on rounds (charted vitals HR 79-120 today) --HR's up this AM, increased Cardizem slightly, 30 mg q8h (from q12h) with BP hold parameters  Hyperkalemia 1/26 Potassium 5.3--5.1  - treated with [facility] 1/26 K 5.3 -- Lokelma 10 g x 1 again today Discontinue spironolactone Entresto held yesterday but was already given this AM. Continue low potassium diet and monitor potassium level daily    Chronic systolic CHF (congestive heart failure) (HCC) Clinically euvolemic, last EF on record 36% in July 2020 Continue digoxin, furosemide, metoprolol, Entresto and spironolactone 08/31/22 TTE shows LVEF 25 to 30%, severely decreased LV function, moderate LV hypertrophy.  Moderate MR Clinically no signs of volume overload    Uncontrolled type 2 diabetes mellitus with hyperglycemia, without long-term current use of insulin (HCC) Sliding scale insulin coverage   History of malignant gastrointestinal stromal tumor (GIST) No acute issues suspected   Stage 3a chronic kidney disease ([place]) Renal function at baseline   Adult hypothyroidism Continue levothyroxine   Essential (primary) hypertension BP controlled.  Continue home antihypertensives   Peripheral neuropathy, continued gabapentin 600 mg p.o. 3 times daily home dose  Bilateral sensorineural hearing loss, very hard of hearing.  Constipation, started laxatives. 1/26 no BM, Dulcolax suppository one-time dose ordered, followed by Fleet enema if no BM   Body mass index is 22.66 kg/m.  Nutrition Problem: Increased  nutrient needs Etiology: post-op healing Interventions: Interventions: Ensure Enlive (each supplement provides 350kcal and 20 grams of protein), MVI     Diet: Regular diet DVT Prophylaxis: Eliquis  Advance goals of care discussion: DNR  Family Communication: family was present at bedside, at the time of interview.  The pt provided permission to discuss medical plan with the family. Opportunity was given to ask question and all questions were answered satisfactorily.   Disposition:  Pt is from Home, admitted with fall right femur fracture, s/p ORIF done on 1/22, PT and OT recommend SNF placement.   Monitoring HR's with A-fib closely. Discharge to SNF,  when bed will be available.     Subjective:  Pt seen with daughter at bedside this AM.  Pt mostly appears sleeping, but wakes at times.  Quite hard of hearing.  Pt is minimally talkative and fatigued after working with therapy.  Patient says her hip "hurts right much" after moving around. Daughter reports she does not tolerate strong pain meds well.   Agreeable to scheduling Tylenol to stay on top of it. Tramadol does okay when she needs it, but overall tylenol has been sufficient.   HR's on monitor were mildly elevated this AM.    Physical Exam:  General exam: sleeping, wakes at times, no acute distress, minimally interactive HEENT: right lower eyelid appears was scratched with tiny amount dried blood, moist mucus membranes, hard of hearing  Respiratory system: CTAB, no wheezes, rales or rhonchi, normal respiratory effort. Cardiovascular system: normal S1/S2, irregulary irregular, no pedal edema.   Gastrointestinal system: soft, NT, ND, no HSM felt, +bowel sounds. Central nervous system: A&O x self at least. no gross focal neurologic deficits, normal speech Extremities: right lateral thigh with clean dry intact honeycomb dressing no surrounding warmth erythema ecchymosis  or swelling, no edema, normal tone Skin: dry, intact,  normal temperature Psychiatry: normal mood, congruent affect, judgement and insight appear normal   Vitals:   09/05/22 0804 09/05/22 1000 09/05/22 1200 09/05/22 1206  BP: 124/85 (!) 140/56  135/84  Pulse: (!) 120 89 93 79  Resp: (!) 22 (!) '22 18 17  '$ Temp: 99.3 F (37.4 C) 98.1 F (36.7 C)  98.6 F (37 C)  TempSrc: Oral Oral  Axillary  SpO2: 97% 100% 98% 98%  Weight:      Height:        Intake/Output Summary (Last 24 hours) at 09/05/2022 1611 Last data filed at 09/05/2022 1527 Gross per 24 hour  Intake --  Output 2450 ml  Net -2450 ml   Filed Weights   08/30/22 0656 08/31/22 1121  Weight: 67.6 kg 67.6 kg    Data Reviewed:  Notable labs --- Na trending down 132 from 133, 136. K elevated mildly 5.3.  Cl 95, glucose 244, BUN 31, Cr 1.05 stable, Ca 8.4, GFR 50.    CBC with WBC 13.8 from 11.3,  Hbg 9.3 from 8.7    Time spent: 40 minutes  Author: Nicole Kindred, DO [facility] 09/05/2022 4:11 PM  To reach On-call, see care teams to locate the attending and reach out to them via [email or web address]. If 7PM-7AM, please contact night-coverage If you still have difficulty reaching the attending provider, please page the [facility] (Director on Call) for [facility] on  amion for assistance.  

## 2022-09-05 NOTE — Progress Notes (Addendum)
Pt developed minor bleeding from her right eye. The bleeding most likely related to scratching her right eye lid. The right eye was cleansed, artificial tears applied, and the bleeding stopped. The provider on coverage B. Randol Kern, NP was notified. We will continue to monitor

## 2022-09-05 NOTE — Progress Notes (Signed)
Physical Therapy Treatment Patient Details Name: Dawn Burnett MRN: [id number] DOB: 07-08-1929 Today's Date: 09/05/2022   History of Present Illness Pt is a 89 y.o. female with PMH that includes: a-fib, HTN, GI stromal tumor, CKD 3a, systolic CHF (EF 23% 12/5730), hypothyroidism and DM 2 who presents to the ED following an accidental fall after she slipped when she got up to go to the bathroom. Pt diagnosed with right periprosthetic proximal femur fracture and is s/p ORIF. A-fib with RVR and torsade developed after surgery, cleard for PT by cardiology 09/02/22.    PT Comments    Pt was long sitting in bed upon arrival with supportive daughter at bedside. Pt is alert and oriented but requires increased processing time + needs author to speak slow and loud due to [facility]. Pt agrees to session and overall tolerated well. Noted elevated K+. Pt's HR in A fib with HR reaching 130s (BPM) at times. She tolerated sitting at R side of bed and performing ther ex. Does fatigue quickly and endorses increased R hip pain with doing the exercises. After sitting EOB x ~ 15 minutes pt required max assist to reposition to supine. RN in room. Acute PT will continue to follow and progress as able per current POC. SNF at DC remains most appropriate DC disposition to maximize independence while decreasing caregiver burden.    Recommendations for follow up therapy are one component of a multi-disciplinary discharge planning process, led by the attending physician.  Recommendations may be updated based on patient status, additional functional criteria and insurance authorization.  Follow Up Recommendations  Skilled nursing-short term rehab (<3 hours/day)     Assistance Recommended at Discharge Frequent or constant Supervision/Assistance  Patient can return home with the following Two people to help with walking and/or transfers;A lot of help with bathing/dressing/bathroom;Assistance with cooking/housework;Direct  supervision/assist for medications management;Assist for transportation;Help with stairs or ramp for entrance   Equipment Recommendations  Other (comment) (defer to next level of care)       Precautions / Restrictions Precautions Precautions: Fall;Posterior Hip Precaution Booklet Issued: Yes (comment) Restrictions Weight Bearing Restrictions: Yes RLE Weight Bearing: Non weight bearing     Mobility  Bed Mobility Overal bed mobility: Needs Assistance Bed Mobility: Supine to Sit  Supine to sit: Mod assist, Max assist, HOB elevated Sit to supine: Max assist   General bed mobility comments: Increased time to perform all desired task. assisted RLE to EOB. more assistance required to return to supine than to achieve EOB short sit    Transfers    General transfer comment: Elected not to attempt transfers this session due to pain/ HR in A-fib reach 130s at times, and NWB restrictions. Will return +2 assistance for safety for all standing attempts/transfers.     Balance Overall balance assessment: Needs assistance Sitting-balance support: Feet supported, Bilateral upper extremity supported Sitting balance-Leahy Scale: Good Sitting balance - Comments: pt sat EOB x ~ 15 minutes while perform several seated exercises       Standing balance comment: not formally assessed      Cognition Arousal/Alertness: Awake/alert Behavior During Therapy: Flat affect Overall Cognitive Status: History of cognitive impairments - at baseline      General Comments: Pt able to follow simple commands with extra time and cuing, very [facility]        Exercises General Exercises - Lower Extremity Ankle Circles/Pumps: AROM, Both, 10 reps Long Arc Quad: AROM, 10 reps    General Comments General comments (skin integrity, edema,  etc.): pt does c/o pain in RLE durng ther ex. perform LAQ, seated marching, ankle pumps while at EOB. pt endorses fatigue after performance and was returned to supine. pt quickly drift  off to sleep after returning to supine. RN in room with pt's supportive daughter present throughout.      Pertinent Vitals/Pain Pain Assessment Breathing: normal Negative Vocalization: occasional moan/groan, low speech, negative/disapproving quality Facial Expression: smiling or inexpressive Body Language: relaxed Consolability: no need to console PAINAD Score: 1 Pain Location: Right Hip Pain Descriptors / Indicators: Discomfort Pain Intervention(s): Limited activity within patient's tolerance, Monitored during session, Premedicated before session, Repositioned     PT Goals (current goals can now be found in the care plan section) Acute Rehab PT Goals Patient Stated Goal: none stated Progress towards PT goals: Progressing toward goals    Frequency    7X/week      PT Plan Current plan remains appropriate       AM-PAC PT "6 Clicks" Mobility   Outcome Measure  Help needed turning from your back to your side while in a flat bed without using bedrails?: A Lot Help needed moving from lying on your back to sitting on the side of a flat bed without using bedrails?: A Lot Help needed moving to and from a bed to a chair (including a wheelchair)?: Total Help needed standing up from a chair using your arms (e.g., wheelchair or bedside chair)?: Total Help needed to walk in hospital room?: Total Help needed climbing 3-5 steps with a railing? : Total 6 Click Score: 8    End of Session   Activity Tolerance: Patient limited by fatigue;Patient limited by pain Patient left: in bed;with call bell/phone within reach;with bed alarm set;with nursing/sitter in room;with family/visitor present;with SCD's reapplied Nurse Communication: Mobility status;Precautions;Weight bearing status PT Visit Diagnosis: Unsteadiness on feet (R26.81);History of falling (Z91.81);Other abnormalities of gait and mobility (R26.89);Muscle weakness (generalized) (M62.81);Repeated falls (R29.6)     Time:  0737-1062 PT Time Calculation (min) (ACUTE ONLY): 21 min  Charges:  $Therapeutic Activity: 8-22 mins                    Julaine Fusi PTA 09/05/22, 11:17 AM

## 2022-09-05 NOTE — Progress Notes (Signed)
   Subjective: 5 Days Post-Op Procedure(s) (LRB): OPEN REDUCTION INTERNAL FIXATION (ORIF) PERIPROSTHETIC FEMORAL SHAFT FRACTURE (Right) Patient doing well this am. Daughter at bedside. Pain well controlled. No complaints this am. Patient with bowel movement last night Plan is to go Skilled nursing facility after hospital stay.  Objective: Vital signs in last 24 hours: Temp:  [98.6 F (37 C)-99.7 F (37.6 C)] 99.3 F (37.4 C) (01/27 0804) Pulse Rate:  [79-120] 120 (01/27 0804) Resp:  [18-25] 25 (01/27 0804) BP: (124-146)/(59-91) 124/85 (01/27 0804) SpO2:  [93 %-98 %] 97 % (01/27 0804)  Intake/Output from previous day: 01/26 0701 - 01/27 0700 In: -  Out: 2100 [Urine:2100] Intake/Output this shift: No intake/output data recorded.  Recent Labs    09/03/22 0621 09/04/22 0626 09/05/22 0555  HGB 8.1* 8.7* 9.3*   Recent Labs    09/04/22 0626 09/05/22 0555  WBC 11.3* 13.8*  RBC 2.75* 2.95*  HCT 26.8* 28.4*  PLT 180 229   Recent Labs    09/04/22 1206 09/05/22 0555  NA 133* 132*  K 5.1 5.3*  CL 97* 95*  CO2 26 26  BUN 31* 31*  CREATININE 1.01* 1.05*  GLUCOSE 239* 244*  CALCIUM 8.4* 8.4*   No results for input(s): "LABPT", "INR" in the last 72 hours.   EXAM General - Patient is Alert, Appropriate, and Oriented Extremity - Neurovascular intact Sensation intact distally Intact pulses distally Dorsiflexion/Plantar flexion intact No cellulitis present Compartment soft Dressing - dressing C/D/I and no drainage Motor Function - intact, moving foot and toes well on exam.   Past Medical History:  Diagnosis Date   Arthritis    Atrial fibrillation (HCC)    CHF (congestive heart failure) (HCC)    Diabetes mellitus without complication (HCC)    GERD (gastroesophageal reflux disease)    Hiatal hernia    Hypertension    Neuropathy    Skin cancer, basal cell    Thyroid disease     Assessment/Plan:   5 Days Post-Op Procedure(s) (LRB): OPEN REDUCTION INTERNAL  FIXATION (ORIF) PERIPROSTHETIC FEMORAL SHAFT FRACTURE (Right) Principal Problem:   Periprosthetic fracture around internal prosthetic right hip joint (HCC) Active Problems:   Atrial fibrillation (HCC)   Essential (primary) hypertension   Adult hypothyroidism   Uncontrolled type 2 diabetes mellitus with hyperglycemia, without long-term current use of insulin (HCC)   Stage 3a chronic kidney disease (HCC)   Chronic anticoagulation   History of malignant gastrointestinal stromal tumor (GIST)   Chronic systolic CHF (congestive heart failure) (HCC)  Estimated body mass index is 22.66 kg/m as calculated from the following:   Height as of this encounter: '5\' 8"'$  (1.727 m).   Weight as of this encounter: 67.6 kg. Advance diet Up with therapy Doing well. Pain well-controlled. Acute post op blood loss anemia -hemoglobin 9.3, stable, trending up.  Vital signs are stable Incision site appears well with minimal swelling, no drainage.  Follow up with [facility] in 2 weeks for recheck  DVT Prophylaxis - TED hose and SCDs Eliquis Non weight bearing Right lower extremity   T. Rachelle Hora, PA-C [place] 09/05/2022, 8:41 AM

## 2022-09-06 DIAGNOSIS — E1165 Type 2 diabetes mellitus with hyperglycemia: Secondary | ICD-10-CM | POA: Diagnosis not present

## 2022-09-06 DIAGNOSIS — I5022 Chronic systolic (congestive) heart failure: Secondary | ICD-10-CM | POA: Diagnosis not present

## 2022-09-06 DIAGNOSIS — Z7901 Long term (current) use of anticoagulants: Secondary | ICD-10-CM | POA: Diagnosis not present

## 2022-09-06 DIAGNOSIS — N1831 Chronic kidney disease, stage 3a: Secondary | ICD-10-CM | POA: Diagnosis not present

## 2022-09-06 LAB — GLUCOSE, CAPILLARY
Glucose-Capillary: 211 mg/dL — ABNORMAL HIGH (ref 70–99)
Glucose-Capillary: 191 mg/dL — ABNORMAL HIGH (ref 70–99)
Glucose-Capillary: 216 mg/dL — ABNORMAL HIGH (ref 70–99)
Glucose-Capillary: 83 mg/dL (ref 70–99)

## 2022-09-06 LAB — CBC
HCT: 29.5 % — ABNORMAL LOW (ref 36.0–46.0)
Hemoglobin: 9.7 g/dL — ABNORMAL LOW (ref 12.0–15.0)
MCH: 31.7 pg (ref 26.0–34.0)
MCHC: 32.9 g/dL (ref 30.0–36.0)
MCV: 96.4 fL (ref 80.0–100.0)
Platelets: 224 10*3/uL (ref 150–400)
RBC: 3.06 MIL/uL — ABNORMAL LOW (ref 3.87–5.11)
RDW: 13.3 % (ref 11.5–15.5)
WBC: 11.5 10*3/uL — ABNORMAL HIGH (ref 4.0–10.5)
nRBC: 0 % (ref 0.0–0.2)

## 2022-09-06 LAB — MAGNESIUM: Magnesium: 1.9 mg/dL (ref 1.7–2.4)

## 2022-09-06 LAB — BASIC METABOLIC PANEL
Anion gap: 6 (ref 5–15)
BUN: 33 mg/dL — ABNORMAL HIGH (ref 8–23)
CO2: 29 mmol/L (ref 22–32)
Calcium: 8.6 mg/dL — ABNORMAL LOW (ref 8.9–10.3)
Chloride: 99 mmol/L (ref 98–111)
Creatinine, Ser: 1.1 mg/dL — ABNORMAL HIGH (ref 0.44–1.00)
GFR, Estimated: 47 mL/min — ABNORMAL LOW (ref >60)
Glucose, Bld: 209 mg/dL — ABNORMAL HIGH (ref 70–99)
Potassium: 4.9 mmol/L (ref 3.5–5.1)
Sodium: 134 mmol/L — ABNORMAL LOW (ref 135–145)

## 2022-09-06 MED ORDER — INSULIN ASPART 100 UNIT/ML IJ SOLN
8.0000 [IU] | Freq: Three times a day (TID) | INTRAMUSCULAR | Status: DC
  Administered 2022-09-06 – 2022-09-07 (×3): 8 [IU] via SUBCUTANEOUS
  Filled 2022-09-06 (×3): qty 1

## 2022-09-06 MED ORDER — SACCHAROMYCES BOULARDII 250 MG PO CAPS
250.0000 mg | ORAL_CAPSULE | Freq: Two times a day (BID) | ORAL | Status: DC
  Administered 2022-09-06 – 2022-09-07 (×2): 250 mg via ORAL
  Filled 2022-09-06 (×2): qty 1

## 2022-09-06 NOTE — Progress Notes (Signed)
PT Cancellation Note  Patient Details Name: Dawn Burnett MRN: [id number] DOB: [birth year]   Cancelled Treatment:    Reason Eval/Treat Not Completed: Fatigue/lethargy limiting ability to participate  Attempted x 3.  Lethargy barrier to session today.  Family in room declined session and requested her to rest and to try again tomorrow.  Request honored.   Chesley Noon 09/06/2022, 2:19 PM

## 2022-09-06 NOTE — Plan of Care (Signed)
Problem: Education: Goal: Ability to describe self-care measures that may prevent or decrease complications (Diabetes Survival Skills Education) will improve 09/06/2022 0843 by Orvan Seen, RN Outcome: Progressing 09/06/2022 0843 by Orvan Seen, RN Outcome: Progressing Goal: Individualized Educational Video(s) 09/06/2022 0843 by Orvan Seen, RN Outcome: Progressing 09/06/2022 0843 by Orvan Seen, RN Outcome: Progressing   Problem: Coping: Goal: Ability to adjust to condition or change in health will improve 09/06/2022 0843 by Orvan Seen, RN Outcome: Progressing 09/06/2022 0843 by Orvan Seen, RN Outcome: Progressing   Problem: Fluid Volume: Goal: Ability to maintain a balanced intake and output will improve 09/06/2022 0843 by Orvan Seen, RN Outcome: Progressing 09/06/2022 0843 by Orvan Seen, RN Outcome: Progressing   Problem: Health Behavior/Discharge Planning: Goal: Ability to identify and utilize available resources and services will improve 09/06/2022 0843 by Orvan Seen, RN Outcome: Progressing 09/06/2022 0843 by Orvan Seen, RN Outcome: Progressing Goal: Ability to manage health-related needs will improve 09/06/2022 0843 by Orvan Seen, RN Outcome: Progressing 09/06/2022 0843 by Orvan Seen, RN Outcome: Progressing   Problem: Metabolic: Goal: Ability to maintain appropriate glucose levels will improve 09/06/2022 0843 by Orvan Seen, RN Outcome: Progressing 09/06/2022 0843 by Orvan Seen, RN Outcome: Progressing   Problem: Nutritional: Goal: Maintenance of adequate nutrition will improve 09/06/2022 0843 by Orvan Seen, RN Outcome: Progressing 09/06/2022 0843 by Orvan Seen, RN Outcome: Progressing Goal: Progress toward achieving an optimal weight will improve 09/06/2022 0843 by Orvan Seen, RN Outcome: Progressing 09/06/2022 0843 by Orvan Seen, RN Outcome: Progressing   Problem:  Skin Integrity: Goal: Risk for impaired skin integrity will decrease 09/06/2022 0843 by Orvan Seen, RN Outcome: Progressing 09/06/2022 0843 by Orvan Seen, RN Outcome: Progressing   Problem: Tissue Perfusion: Goal: Adequacy of tissue perfusion will improve 09/06/2022 0843 by Orvan Seen, RN Outcome: Progressing 09/06/2022 0843 by Orvan Seen, RN Outcome: Progressing   Problem: Education: Goal: Knowledge of General Education information will improve Description: Including pain rating scale, medication(s)/side effects and non-pharmacologic comfort measures 09/06/2022 0843 by Orvan Seen, RN Outcome: Progressing 09/06/2022 0843 by Orvan Seen, RN Outcome: Progressing   Problem: Health Behavior/Discharge Planning: Goal: Ability to manage health-related needs will improve 09/06/2022 0843 by Orvan Seen, RN Outcome: Progressing 09/06/2022 0843 by Orvan Seen, RN Outcome: Progressing   Problem: Clinical Measurements: Goal: Ability to maintain clinical measurements within normal limits will improve 09/06/2022 0843 by Orvan Seen, RN Outcome: Progressing 09/06/2022 0843 by Orvan Seen, RN Outcome: Progressing Goal: Will remain free from infection 09/06/2022 0843 by Orvan Seen, RN Outcome: Progressing 09/06/2022 0843 by Orvan Seen, RN Outcome: Progressing Goal: Diagnostic test results will improve 09/06/2022 0843 by Orvan Seen, RN Outcome: Progressing 09/06/2022 0843 by Orvan Seen, RN Outcome: Progressing Goal: Respiratory complications will improve 09/06/2022 0843 by Orvan Seen, RN Outcome: Progressing 09/06/2022 0843 by Orvan Seen, RN Outcome: Progressing Goal: Cardiovascular complication will be avoided 09/06/2022 0843 by Orvan Seen, RN Outcome: Progressing 09/06/2022 0843 by Orvan Seen, RN Outcome: Progressing   Problem: Activity: Goal: Risk for activity intolerance will  decrease 09/06/2022 0843 by Orvan Seen, RN Outcome: Progressing 09/06/2022 0843 by Orvan Seen, RN Outcome: Progressing   Problem: Nutrition: Goal: Adequate nutrition will be maintained 09/06/2022 0843 by Orvan Seen, RN Outcome: Progressing 09/06/2022 0843 by Orvan Seen, RN Outcome: Progressing  Problem: Coping: Goal: Level of anxiety will decrease 09/06/2022 0843 by Orvan Seen, RN Outcome: Progressing 09/06/2022 0843 by Orvan Seen, RN Outcome: Progressing   Problem: Elimination: Goal: Will not experience complications related to bowel motility 09/06/2022 0843 by Orvan Seen, RN Outcome: Progressing 09/06/2022 0843 by Orvan Seen, RN Outcome: Progressing Goal: Will not experience complications related to urinary retention 09/06/2022 0843 by Orvan Seen, RN Outcome: Progressing 09/06/2022 0843 by Orvan Seen, RN Outcome: Progressing   Problem: Pain Managment: Goal: General experience of comfort will improve 09/06/2022 0843 by Orvan Seen, RN Outcome: Progressing 09/06/2022 0843 by Orvan Seen, RN Outcome: Progressing   Problem: Safety: Goal: Ability to remain free from injury will improve 09/06/2022 0843 by Orvan Seen, RN Outcome: Progressing 09/06/2022 0843 by Orvan Seen, RN Outcome: Progressing   Problem: Skin Integrity: Goal: Risk for impaired skin integrity will decrease 09/06/2022 0843 by Orvan Seen, RN Outcome: Progressing 09/06/2022 0843 by Orvan Seen, RN Outcome: Progressing   Problem: Education: Goal: Verbalization of understanding the information provided (i.e., activity precautions, restrictions, etc) will improve 09/06/2022 0843 by Orvan Seen, RN Outcome: Progressing 09/06/2022 0843 by Orvan Seen, RN Outcome: Progressing Goal: Individualized Educational Video(s) 09/06/2022 0843 by Orvan Seen, RN Outcome: Progressing 09/06/2022 0843 by Orvan Seen,  RN Outcome: Progressing   Problem: Activity: Goal: Ability to ambulate and perform ADLs will improve 09/06/2022 0843 by Orvan Seen, RN Outcome: Progressing 09/06/2022 0843 by Orvan Seen, RN Outcome: Progressing   Problem: Clinical Measurements: Goal: Postoperative complications will be avoided or minimized 09/06/2022 0843 by Orvan Seen, RN Outcome: Progressing 09/06/2022 0843 by Orvan Seen, RN Outcome: Progressing   Problem: Self-Concept: Goal: Ability to maintain and perform role responsibilities to the fullest extent possible will improve 09/06/2022 0843 by Orvan Seen, RN Outcome: Progressing 09/06/2022 0843 by Orvan Seen, RN Outcome: Progressing   Problem: Pain Management: Goal: Pain level will decrease 09/06/2022 0843 by Orvan Seen, RN Outcome: Progressing 09/06/2022 0843 by Orvan Seen, RN Outcome: Progressing

## 2022-09-06 NOTE — Progress Notes (Addendum)
[facility] Progress Note  Patient: Dawn Burnett    QJJ:[id number]  DOA: 08/30/2022     Date of Service: the patient was seen and examined on 09/06/2022  Chief Complaint  Patient presents with   Hip Pain   Fall   Brief hospital course:  Dawn Burnett is a 89 y.o. female with medical history significant for Atrial fibrillation on Eliquis, HTN, history of gastrointestinal stromal tumor, CKD 3a, systolic CHF (EF 48% 08/8561), hypothyroidism and non-insulin-dependent type 2 diabetes who presents to the ED following an accidental fall after she slipped when she got up to go to the bathroom, landing on her right hip.  She denies hitting anywhere else and experiences pain only in her right hip.  History is provided by daughter at bedside as patient is very hard of hearing. ED course and data review: BP 176/77 with otherwise normal vitals.  Labs notable for glucose 217, hemoglobin 11.5EKG, personally viewed and interpreted showing A-fib at 92 with LBBB.  X-ray showing periprosthetic right proximal femur fracture.  Chest x-ray was clear.  The ED provider spoke with orthopedist, Dr. Karel Jarvis who will take patient to the OR following a 24-hour Eliquis washout.  Hospitalist consulted for admission.     Assessment and Plan:  # Periprosthetic fracture around internal prosthetic right hip joint [facility]) Orthopedic surgery was consulted Underwent ORIF on 1/22 Cardiology was consulted for preop clearance, deemed moderate to high risk due to comorbidities.  Resumed Eliquis on 1/23 Pain control with scheduled Tylenol, PRN Tramadol (avoid if possible as it is overly sedating for pt) Continue fall precautions Non-weight-bearing on right lower extremity  PT OT eval done recommended SNF placement, TOC is following [facility] follow up in 2 weeks Eliquis and SCD's for DVT prophylaxis    A-fib with RVR and torsade developed after surgery on 1/22 H/o Atrial fibrillation (HCC) Chronic anticoagulation, on 1/23  resumed Eliquis, cleared by ortho Continue metoprolol 100 mg POD home dose S/p IV Lopressor and IV Cardizem given, s/p Cardizem IV infusion, gradually weaned off Magnesium IV 1 g followed by 2 g given due to torsade, cardiology is aware Digoxin was given in the OR, patient was started on digoxin 1.25 mg p.o. daily S/p Cardizem IR 30 mg p.o. q6h, d/c'd on 1/25 as per cardio 1/25 patient was started back on Cardizem 30 mg p.o. every 12 hourly 1/26 patient was given digoxin 0.125 mg IV one-time dose as per cardiology due to persistent A-fib with RVR 1/27 HR on monitor 90's to 110's on rounds (charted vitals HR 79-120 today) 1/28 Cardizem given early this AM due to HR in 120's --Cardizem 30 mg q8h (increased frequency from q12h) with BP hold parameters  Hyperkalemia 1/26 Potassium 5.3--5.1  - treated with [facility] 1/27 K 5.3 -- Lokelma 10 g x 1 again today 1/28 K 4.9 Monitor BMP daily Discontinued spironolactone She is on Entresto which contributes as well Continue low potassium diet     Chronic systolic CHF (congestive heart failure) (HCC) Clinically euvolemic, last EF on record 36% in July 2020 Continue digoxin, furosemide, metoprolol, Entresto and spironolactone 08/31/22 TTE shows LVEF 25 to 30%, severely decreased LV function, moderate LV hypertrophy.  Moderate MR Clinically no signs of volume overload    Uncontrolled type 2 diabetes mellitus with hyperglycemia, without long-term current use of insulin (HCC) Sliding scale + scheduled mealtime Novolog, increase 6 >> 8 units TID WC   History of malignant gastrointestinal stromal tumor (GIST) No acute issues suspected  Stage 3a chronic kidney disease ([place]) Renal function at baseline   Adult hypothyroidism Continue levothyroxine   Essential (primary) hypertension BP controlled.  Continue home antihypertensives   Peripheral neuropathy, continued gabapentin 600 mg p.o. 3 times daily home dose  Bilateral sensorineural hearing  loss, very hard of hearing.  Constipation, started laxatives. 1/26 no BM, Dulcolax suppository one-time dose ordered, followed by Fleet enema if no BM   Body mass index is 22.66 kg/m.  Nutrition Problem: Increased nutrient needs Etiology: post-op healing Interventions: Interventions: Ensure Enlive (each supplement provides 350kcal and 20 grams of protein), MVI     Diet: Regular diet DVT Prophylaxis: Eliquis  Advance goals of care discussion: DNR  Family Communication: family was present at bedside, at the time of interview.  The pt provided permission to discuss medical plan with the family. Opportunity was given to ask question and all questions were answered satisfactorily.   Disposition:  Pt is from Home, admitted with fall right femur fracture, s/p ORIF done on 1/22, PT and OT recommend SNF placement.   Monitoring HR's with A-fib closely. Discharge to SNF,  when bed will be available.     Subjective:  Pt seen with son at bedside this AM.  Pt was given Tramadol earlier this AM and is now extremely sleepy and not able to stay awake very long.  No acute issues or complaints reported.  HR was high this AM so Cardizem was given earlier and HR's are better.   Physical Exam:  General exam: sleeping, wakes at times, no acute distress, minimally interactive HEENT: right lower eyelid appears was scratched with tiny amount dried blood, moist mucus membranes, hard of hearing  Respiratory system: CTAB, no wheezes, rales or rhonchi, normal respiratory effort. Cardiovascular system: normal S1/S2, irregulary irregular, no pedal edema.   Gastrointestinal system: soft, NT, ND, no HSM felt, +bowel sounds. Central nervous system: A&O x self at least. no gross focal neurologic deficits, normal speech Extremities: right lateral thigh with clean dry intact honeycomb dressing no surrounding warmth erythema ecchymosis  or swelling, no edema, normal tone Skin: dry, intact, normal  temperature Psychiatry: normal mood, congruent affect, judgement and insight appear normal   Vitals:   09/05/22 2345 09/06/22 1002 09/06/22 1330 09/06/22 1559  BP:  (!) 120/53 (!) 152/51 (!) 135/52  Pulse: (!) 109 96 65 79  Resp:  '18 18 18  '$ Temp: 98.1 F (36.7 C) 98.2 F (36.8 C) 98.4 F (36.9 C) 98.4 F (36.9 C)  TempSrc: Oral Oral    SpO2:  96% 96% 97%  Weight:      Height:        Intake/Output Summary (Last 24 hours) at 09/06/2022 1635 Last data filed at 09/06/2022 0700 Gross per 24 hour  Intake --  Output 1350 ml  Net -1350 ml   Filed Weights   08/30/22 0656 08/31/22 1121  Weight: 67.6 kg 67.6 kg    Data Reviewed:  Notable labs --- Na 132 >> 134, glucose 209, BUN 33, Cr 1.05>>1.10, Ca 8.6  CBC with WBC  13.8 >> 11.5k,  Hbg 9.3 >> 9.7    Time spent: 36 minutes  Author: Nicole Kindred, DO [facility] 09/06/2022 4:35 PM  To reach On-call, see care teams to locate the attending and reach out to them via [email or web address]. If 7PM-7AM, please contact night-coverage If you still have difficulty reaching the attending provider, please page the [facility] (Director on Call) for [facility] on amion for assistance.

## 2022-09-06 NOTE — Progress Notes (Signed)
   Subjective: 6 Days Post-Op Procedure(s) (LRB): OPEN REDUCTION INTERNAL FIXATION (ORIF) PERIPROSTHETIC FEMORAL SHAFT FRACTURE (Right) Patient sleeping this am. Son at bedside. States patient has not been eating as well. Glucerna ordered. Patients pain controlled with scheduled tylenol and tramadol prn, last given last night. Patient slept most of the day yesterday. Plan is to go Skilled nursing facility after hospital stay.  Objective: Vital signs in last 24 hours: Temp:  [97.9 F (36.6 C)-98.6 F (37 C)] 98.1 F (36.7 C) (01/27 2345) Pulse Rate:  [79-109] 109 (01/27 2345) Resp:  [17-22] 19 (01/27 2010) BP: (127-140)/(56-99) 136/72 (01/27 2010) SpO2:  [98 %-100 %] 98 % (01/27 1712)  Intake/Output from previous day: 01/27 0701 - 01/28 0700 In: -  Out: 2400 [Urine:2400] Intake/Output this shift: No intake/output data recorded.  Recent Labs    09/04/22 0626 09/05/22 0555 09/06/22 0505  HGB 8.7* 9.3* 9.7*   Recent Labs    09/05/22 0555 09/06/22 0505  WBC 13.8* 11.5*  RBC 2.95* 3.06*  HCT 28.4* 29.5*  PLT 229 224   Recent Labs    09/05/22 0555 09/06/22 0505  NA 132* 134*  K 5.3* 4.9  CL 95* 99  CO2 26 29  BUN 31* 33*  CREATININE 1.05* 1.10*  GLUCOSE 244* 209*  CALCIUM 8.4* 8.6*   No results for input(s): "LABPT", "INR" in the last 72 hours.   EXAM General - Patient is sleeping Extremity - No cellulitis present Compartment soft DP pulse present RLE Dressing - dressing C/D/I and no drainage   Past Medical History:  Diagnosis Date   Arthritis    Atrial fibrillation (HCC)    CHF (congestive heart failure) (HCC)    Diabetes mellitus without complication (HCC)    GERD (gastroesophageal reflux disease)    Hiatal hernia    Hypertension    Neuropathy    Skin cancer, basal cell    Thyroid disease     Assessment/Plan:   6 Days Post-Op Procedure(s) (LRB): OPEN REDUCTION INTERNAL FIXATION (ORIF) PERIPROSTHETIC FEMORAL SHAFT FRACTURE (Right) Principal  Problem:   Periprosthetic fracture around internal prosthetic right hip joint (HCC) Active Problems:   Atrial fibrillation ([place])   Essential (primary) hypertension   Adult hypothyroidism   Uncontrolled type 2 diabetes mellitus with hyperglycemia, without long-term current use of insulin (HCC)   Stage 3a chronic kidney disease (HCC)   Chronic anticoagulation   History of malignant gastrointestinal stromal tumor (GIST)   Chronic systolic CHF (congestive heart failure) (HCC)  Estimated body mass index is 22.66 kg/m as calculated from the following:   Height as of this encounter: '5\' 8"'$  (1.727 m).   Weight as of this encounter: 67.6 kg. Advance diet Up with therapy Pain controlled with PRN tramadol and scheduled tylenol.  Acute post op blood loss anemia -hemoglobin 9.7, stable, trending up.  Vital signs are stable Incision site appears well with minimal swelling, no drainage. Encourage son to try to keep patient up and alert during the day, open blinds during the day to prevent sundowning.  Follow up with [place] [facility] in 2 weeks for recheck  DVT Prophylaxis - TED hose and SCDs Eliquis Non weight bearing Right lower extremity   T. Rachelle Hora, PA-C [place] 09/06/2022, 8:49 AM

## 2022-09-07 DIAGNOSIS — Z7901 Long term (current) use of anticoagulants: Secondary | ICD-10-CM | POA: Diagnosis not present

## 2022-09-07 DIAGNOSIS — N1831 Chronic kidney disease, stage 3a: Secondary | ICD-10-CM | POA: Diagnosis not present

## 2022-09-07 DIAGNOSIS — I4821 Permanent atrial fibrillation: Secondary | ICD-10-CM | POA: Diagnosis not present

## 2022-09-07 DIAGNOSIS — I5022 Chronic systolic (congestive) heart failure: Secondary | ICD-10-CM | POA: Diagnosis not present

## 2022-09-07 LAB — CBC
HCT: 29.9 % — ABNORMAL LOW (ref 36.0–46.0)
Hemoglobin: 9.5 g/dL — ABNORMAL LOW (ref 12.0–15.0)
MCH: 31.3 pg (ref 26.0–34.0)
MCHC: 31.8 g/dL (ref 30.0–36.0)
MCV: 98.4 fL (ref 80.0–100.0)
Platelets: 227 10*3/uL (ref 150–400)
RBC: 3.04 MIL/uL — ABNORMAL LOW (ref 3.87–5.11)
RDW: 13.4 % (ref 11.5–15.5)
WBC: 12 10*3/uL — ABNORMAL HIGH (ref 4.0–10.5)
nRBC: 0 % (ref 0.0–0.2)

## 2022-09-07 LAB — BASIC METABOLIC PANEL
Anion gap: 10 (ref 5–15)
BUN: 36 mg/dL — ABNORMAL HIGH (ref 8–23)
CO2: 28 mmol/L (ref 22–32)
Calcium: 8.6 mg/dL — ABNORMAL LOW (ref 8.9–10.3)
Chloride: 98 mmol/L (ref 98–111)
Creatinine, Ser: 1.2 mg/dL — ABNORMAL HIGH (ref 0.44–1.00)
GFR, Estimated: 43 mL/min — ABNORMAL LOW (ref >60)
Glucose, Bld: 182 mg/dL — ABNORMAL HIGH (ref 70–99)
Potassium: 4.6 mmol/L (ref 3.5–5.1)
Sodium: 136 mmol/L (ref 135–145)

## 2022-09-07 LAB — GLUCOSE, CAPILLARY
Glucose-Capillary: 205 mg/dL — ABNORMAL HIGH (ref 70–99)
Glucose-Capillary: 155 mg/dL — ABNORMAL HIGH (ref 70–99)

## 2022-09-07 LAB — MAGNESIUM: Magnesium: 1.8 mg/dL (ref 1.7–2.4)

## 2022-09-07 MED ORDER — MIRABEGRON ER 50 MG PO TB24: 50.0000 mg | ORAL_TABLET | Freq: Every day | ORAL | Status: DC

## 2022-09-07 MED ORDER — POLYETHYLENE GLYCOL 3350 17 G PO PACK: 17.0000 g | PACK | Freq: Two times a day (BID) | ORAL | 0 refills | Status: DC

## 2022-09-07 MED ORDER — DIGOXIN 125 MCG PO TABS: 0.1250 mg | ORAL_TABLET | Freq: Every day | ORAL | Status: DC

## 2022-09-07 MED ORDER — INSULIN ASPART 100 UNIT/ML IJ SOLN: 10.0000 [IU] | Freq: Three times a day (TID) | INTRAMUSCULAR | 11 refills | Status: DC

## 2022-09-07 MED ORDER — POLYVINYL ALCOHOL 1.4 % OP SOLN: 1.0000 [drp] | OPHTHALMIC | 0 refills | Status: DC | PRN

## 2022-09-07 MED ORDER — ACETAMINOPHEN 500 MG PO TABS: 1000.0000 mg | ORAL_TABLET | Freq: Three times a day (TID) | ORAL | 0 refills | Status: DC

## 2022-09-07 MED ORDER — GABAPENTIN 300 MG PO CAPS: 300.0000 mg | ORAL_CAPSULE | Freq: Three times a day (TID) | ORAL | Status: DC

## 2022-09-07 MED ORDER — GLUCERNA SHAKE PO LIQD: 237.0000 mL | Freq: Three times a day (TID) | ORAL | 0 refills | Status: DC

## 2022-09-07 MED ORDER — ONDANSETRON HCL 4 MG PO TABS: 4.0000 mg | ORAL_TABLET | Freq: Four times a day (QID) | ORAL | 0 refills | Status: DC | PRN

## 2022-09-07 MED ORDER — BISACODYL 5 MG PO TBEC: 10.0000 mg | DELAYED_RELEASE_TABLET | Freq: Every day | ORAL | 0 refills | Status: DC

## 2022-09-07 MED ORDER — TRAMADOL HCL 50 MG PO TABS: 50.0000 mg | ORAL_TABLET | Freq: Three times a day (TID) | ORAL | 0 refills | Status: DC | PRN

## 2022-09-07 MED ORDER — BISACODYL 10 MG RE SUPP: 10.0000 mg | Freq: Every day | RECTAL | 0 refills | Status: DC | PRN

## 2022-09-07 MED ORDER — DILTIAZEM HCL 30 MG PO TABS: 30.0000 mg | ORAL_TABLET | Freq: Three times a day (TID) | ORAL | Status: DC

## 2022-09-07 MED ORDER — CALCIUM-VITAMIN D 600-200 MG-UNIT PO TABS: 1.0000 | ORAL_TABLET | Freq: Every day | ORAL | Status: DC

## 2022-09-07 NOTE — TOC Transition Note (Signed)
Transition of Care [facility]) - CM/SW Discharge Note   Patient Details  Name: Dawn Burnett MRN: [id number] Date of Birth: 1928/12/18  Transition of [facility]) CM/SW Contact:  Tiburcio Bash, LCSW Phone Number: 09/07/2022, 11:25 AM   Clinical Narrative:     Patient will DC to: [facility] Anticipated DC date: 09/07/22 Family notified: daughter [name] Transport byJohnanna Schneiders  Per MD patient ready for DC to [facility] . RN, patient, patient's family, and facility notified of DC. Discharge Summary sent to facility. RN given number for report  [phone]. DC packet on chart. Ambulance transport requested for patient.  CSW signing off.  Pricilla Riffle, LCSW    Final next level of care: Skilled Nursing Facility Barriers to Discharge: No Barriers Identified   Patient Goals and CMS Choice CMS Medicare.gov Compare Post Acute Care list provided to:: Patient Choice offered to / list presented to : Patient  Discharge Placement                Patient chooses bed at: [facility]     Patient and family notified of of transfer: 09/07/22  Discharge Plan and Services Additional resources added to the After Visit Summary for                                       Social Determinants of Health (SDOH) Interventions SDOH Screenings   Food Insecurity: No Food Insecurity (08/30/2022)  Housing: Low Risk  (08/30/2022)  Transportation Needs: No Transportation Needs (08/30/2022)  Utilities: Not At Risk (08/30/2022)  Alcohol Screen: Low Risk  (09/09/2020)  Depression (PHQ2-9): Medium Risk (09/09/2020)  Financial Resource Strain: Low Risk  (01/24/2020)  Physical Activity: Inactive (01/24/2020)  Social Connections: Socially Isolated (01/24/2020)  Stress: No Stress Concern Present (01/24/2020)  Tobacco Use: Low Risk  (09/01/2022)     Readmission Risk Interventions     No data to display

## 2022-09-07 NOTE — Progress Notes (Signed)
[facility] Liaison Note  New referral for palliative care to follow at [facility], upon discharge from [facility].  Unfortunately, [facility] has a hospice branch and does not allow our organization to see their patients for hospice nor palliative services in their facility.  We can open patient to palliative services at home once she has finished her skilled days if the family would like to proceed with these services.  Referral sent to referral intake.  Thank you for allowing participation in this patient's care.  Dimas Aguas, RN Nurse Liaison [phone]

## 2022-09-07 NOTE — Progress Notes (Signed)
Physical Therapy Treatment Patient Details Name: Dawn Burnett MRN: [id number] DOB: 1928-11-08 Today's Date: 09/07/2022   History of Present Illness Pt is a 89 y.o. female with PMH that includes: a-fib, HTN, GI stromal tumor, CKD 3a, systolic CHF (EF 62% 04/5283), hypothyroidism and DM 2 who presents to the ED following an accidental fall after she slipped when she got up to go to the bathroom. Pt diagnosed with right periprosthetic proximal femur fracture and is s/p ORIF. A-fib with RVR and torsade developed after surgery, cleard for PT by cardiology 09/02/22.    PT Comments    Pt awake today. Participated in exercises as described below.  She is able to transition to EOB with mod/max a x 1.  She is steady in sitting.  Stands x 3 at EOB with max a x 2.  Care to maintain NWB RLE.  She is only able to stand briefly and time, transfers and gait is not progressed at this time.  She returns to supine with max a x 2/dependant for comfort and positioned for comfort.  She does verbalize for the first time once situated in bed that she is comfortable and ok.   Recommendations for follow up therapy are one component of a multi-disciplinary discharge planning process, led by the attending physician.  Recommendations may be updated based on patient status, additional functional criteria and insurance authorization.  Follow Up Recommendations  Skilled nursing-short term rehab (<3 hours/day)     Assistance Recommended at Discharge Frequent or constant Supervision/Assistance  Patient can return home with the following Two people to help with walking and/or transfers;A lot of help with bathing/dressing/bathroom;Assistance with cooking/housework;Direct supervision/assist for medications management;Assist for transportation;Help with stairs or ramp for entrance   Equipment Recommendations       Recommendations for Other Services       Precautions / Restrictions Precautions Precautions: Fall;Posterior  Hip Precaution Booklet Issued: Yes (comment) Restrictions Weight Bearing Restrictions: Yes RLE Weight Bearing: Non weight bearing     Mobility  Bed Mobility Overal bed mobility: Needs Assistance Bed Mobility: Supine to Sit, Sit to Supine     Supine to sit: Mod assist, Max assist, HOB elevated Sit to supine: Mod assist, Max assist, +2 for physical assistance   General bed mobility comments: +2 to return to bed for comfort    Transfers Overall transfer level: Needs assistance Equipment used: Rolling walker (2 wheels) Transfers: Sit to/from Stand Sit to Stand: Max assist, +2 physical assistance, Total assist           General transfer comment: stood x 3 at EOB.  limited time standing and continues to have difficulty keeping NWB so time, transfer and gait is not progressed.    Ambulation/Gait                   Stairs             Wheelchair Mobility    Modified Rankin (Stroke Patients Only)       Balance Overall balance assessment: Needs assistance Sitting-balance support: Feet supported, Bilateral upper extremity supported Sitting balance-Leahy Scale: Good     Standing balance support: Bilateral upper extremity supported, Reliant on assistive device for balance Standing balance-Leahy Scale: Zero Standing balance comment: relies on staff for support                            Cognition Arousal/Alertness: Awake/alert Behavior During Therapy: Flat affect Overall Cognitive Status: History  of cognitive impairments - at baseline                                 General Comments: only verbalizes at end of session to say she is ok and comfortable.        Exercises Other Exercises Other Exercises: BLE AA/PROM prior to session    General Comments        Pertinent Vitals/Pain Pain Assessment Pain Assessment: Faces Faces Pain Scale: Hurts little more Pain Location: Right Hip with PROM Pain Descriptors / Indicators:  Grimacing, Sore Pain Intervention(s): Limited activity within patient's tolerance, Monitored during session, Repositioned    Home Living                          Prior Function            PT Goals (current goals can now be found in the care plan section) Progress towards PT goals: Progressing toward goals    Frequency    7X/week      PT Plan Current plan remains appropriate    Co-evaluation              AM-PAC PT "6 Clicks" Mobility   Outcome Measure  Help needed turning from your back to your side while in a flat bed without using bedrails?: A Lot Help needed moving from lying on your back to sitting on the side of a flat bed without using bedrails?: A Lot Help needed moving to and from a bed to a chair (including a wheelchair)?: Total Help needed standing up from a chair using your arms (e.g., wheelchair or bedside chair)?: Total Help needed to walk in hospital room?: Total Help needed climbing 3-5 steps with a railing? : Total 6 Click Score: 8    End of Session Equipment Utilized During Treatment: Gait belt Activity Tolerance: Patient limited by fatigue Patient left: in bed;with call bell/phone within reach;with bed alarm set;with nursing/sitter in room Nurse Communication: Mobility status;Precautions;Weight bearing status PT Visit Diagnosis: Unsteadiness on feet (R26.81);History of falling (Z91.81);Other abnormalities of gait and mobility (R26.89);Muscle weakness (generalized) (M62.81);Repeated falls (R29.6)     Time: 1040-1055 PT Time Calculation (min) (ACUTE ONLY): 15 min  Charges:  $Therapeutic Activity: 8-22 mins                   Chesley Noon, PTA 09/07/22, 11:22 AM

## 2022-09-07 NOTE — Plan of Care (Signed)
GOC consult noted. Per notes, patient is working towards discharge to SNF. Note present that patient will be followed by [facility] palliative at [facility] facility. Spoke with attending, PMT will sign off at this time as goals are set. Please reconsult if needed.

## 2022-09-07 NOTE — Care Management Important Message (Signed)
Important Message  Patient Details  Name: Dawn Burnett MRN: [id number] Date of Birth: 1928-10-06   Medicare Important Message Given:  Yes  Patient asleep upon time of visit.  Confirmed copy of Medicare IM available in room for reference.   [name] 09/07/2022, 11:38 AM

## 2022-09-07 NOTE — Progress Notes (Signed)
   Subjective: 7 Days Post-Op Procedure(s) (LRB): OPEN REDUCTION INTERNAL FIXATION (ORIF) PERIPROSTHETIC FEMORAL SHAFT FRACTURE (Right) Patient alert this am. Pain controlled. Daughter at bedside. Daughter states patient remains lethargic. Tylenol only for pain. Tramadol discontinued. No complaints this am Plan is to go Skilled nursing facility after hospital stay.  Objective: Vital signs in last 24 hours: Temp:  [98.2 F (36.8 C)-98.8 F (37.1 C)] 98.2 F (36.8 C) (01/29 0750) Pulse Rate:  [65-104] 88 (01/29 0750) Resp:  [18-20] 20 (01/29 0750) BP: (135-168)/(51-89) 157/71 (01/29 0750) SpO2:  [95 %-99 %] 97 % (01/29 0750)  Intake/Output from previous day: 01/28 0701 - 01/29 0700 In: 360 [P.O.:360] Out: 850 [Urine:850] Intake/Output this shift: No intake/output data recorded.  Recent Labs    09/05/22 0555 09/06/22 0505 09/07/22 0521  HGB 9.3* 9.7* 9.5*   Recent Labs    09/06/22 0505 09/07/22 0521  WBC 11.5* 12.0*  RBC 3.06* 3.04*  HCT 29.5* 29.9*  PLT 224 227   Recent Labs    09/06/22 0505 09/07/22 0521  NA 134* 136  K 4.9 4.6  CL 99 98  CO2 29 28  BUN 33* 36*  CREATININE 1.10* 1.20*  GLUCOSE 209* 182*  CALCIUM 8.6* 8.6*   No results for input(s): "LABPT", "INR" in the last 72 hours.   EXAM General - Patient is alert and oriented Extremity - Intact pulses distally Dorsiflexion/Plantar flexion intact No cellulitis present Compartment soft DP pulse present RLE Dressing - dressing C/D/I and no drainage   Past Medical History:  Diagnosis Date   Arthritis    Atrial fibrillation (HCC)    CHF (congestive heart failure) (HCC)    Diabetes mellitus without complication (HCC)    GERD (gastroesophageal reflux disease)    Hiatal hernia    Hypertension    Neuropathy    Skin cancer, basal cell    Thyroid disease     Assessment/Plan:   7 Days Post-Op Procedure(s) (LRB): OPEN REDUCTION INTERNAL FIXATION (ORIF) PERIPROSTHETIC FEMORAL SHAFT FRACTURE  (Right) Principal Problem:   Periprosthetic fracture around internal prosthetic right hip joint (HCC) Active Problems:   Atrial fibrillation ([place])   Essential (primary) hypertension   Adult hypothyroidism   Uncontrolled type 2 diabetes mellitus with hyperglycemia, without long-term current use of insulin (HCC)   Stage 3a chronic kidney disease (HCC)   Chronic anticoagulation   History of malignant gastrointestinal stromal tumor (GIST)   Chronic systolic CHF (congestive heart failure) (HCC)  Estimated body mass index is 22.66 kg/m as calculated from the following:   Height as of this encounter: '5\' 8"'$  (1.727 m).   Weight as of this encounter: 67.6 kg. Advance diet Up with therapy Pain controlled with tylenol.  Acute post op blood loss anemia -hemoglobin 9.5, stable.  Vital signs are stable Incision site appears well with minimal swelling, no drainage.   Follow up with [place] [facility] in 2 weeks for recheck  DVT Prophylaxis - TED hose and SCDs Eliquis Non weight bearing Right lower extremity   T. Rachelle Hora, PA-C [place] 09/07/2022, 10:51 AM

## 2022-09-07 NOTE — Discharge Summary (Addendum)
Physician Discharge Summary   Patient: Dawn Burnett MRN: [id number] DOB: Feb 20, 1929  Admit date:     08/30/2022  Discharge date: 09/07/22  Discharge Physician: Ezekiel Slocumb   PCP: [name], MD   Recommendations at discharge:    Follow up with Orthopedic surgery in 2 weeks Maintain non-weight-bearing on right leg until Orthopedic surgery instructs otherwise Repeat CBC, BMP, Mg in 1-2 week Follow up with Primary Care Follow up with Cardiology as scheduled, or call to make next appt [place] palliative and/or hospice services available  Discharge Diagnoses: Principal Problem:   Periprosthetic fracture around internal prosthetic right hip joint ([place]) Active Problems:   Atrial fibrillation ([place])   Chronic anticoagulation   Chronic systolic CHF (congestive heart failure) ([place])   Uncontrolled type 2 diabetes mellitus with hyperglycemia, without long-term current use of insulin ([place])   Essential (primary) hypertension   Adult hypothyroidism   Stage 3a chronic kidney disease ([place])   History of malignant gastrointestinal stromal tumor (GIST)  Resolved Problems:   * No resolved hospital problems. *  Hospital Course:  Dawn Burnett is a 89 y.o. female with medical history significant for Atrial fibrillation on Eliquis, HTN, history of gastrointestinal stromal tumor, CKD 3a, systolic CHF (EF 02% 11/971), hypothyroidism and non-insulin-dependent type 2 diabetes who presents to the ED following an accidental fall after she slipped when she got up to go to the bathroom, landing on her right hip.  She denies hitting anywhere else and experiences pain only in her right hip.  History is provided by daughter at bedside as patient is very hard of hearing. ED course and data review: BP 176/77 with otherwise normal vitals.  Labs notable for glucose 217, hemoglobin 11.5EKG, personally viewed and interpreted showing A-fib at 92 with LBBB.  X-ray showing periprosthetic  right proximal femur fracture.  Chest x-ray was clear.  The ED provider spoke with orthopedist, Dr. Karel Jarvis who will take patient to the OR following a 24-hour Eliquis washout.  Hospitalist consulted for admission.   Assessment and Plan:  # Periprosthetic fracture around internal prosthetic right hip joint [facility]) Orthopedic surgery was consulted Underwent ORIF on 1/22 Cardiology was consulted for preop clearance, deemed moderate to high risk due to comorbidities.  Resumed Eliquis on 1/23 Pain control with scheduled Tylenol, PRN Tramadol (avoid if possible as it is overly sedating for pt) Continue fall precautions Non-weight-bearing on right lower extremity  PT OT eval done recommended SNF placement, TOC is following [facility] follow up in 2 weeks Eliquis and SCD's for DVT prophylaxis     A-fib with RVR and torsade developed after surgery on 1/22 H/o Atrial fibrillation ([place]) Initially treated with Cardizem drip and IV digoxin --Chronic anticoagulation, on 1/23 resumed Eliquis, cleared by ortho --Continue metoprolol 100 mg POD home dose --Cardizem 30 mg q8h (increased frequency from q12h) with BP hold parameters  --In follow up, if able to wean off Cardizem, would recommend doing so if HR allows, given underlying HFrEF 1/29 Heart rates controlled, 70's to 90's on monitor this morning   Hyperkalemia Pt had mild intermittent hyperkalemia, treated with Lokelma Monitor BMP in follow up Discontinued spironolactone She is on Entresto which contributes as well Continue low potassium diet     Chronic systolic CHF (congestive heart failure) ([place]) Clinically euvolemic, last EF on record 36% in July 2020 Continue digoxin, furosemide, metoprolol, Entresto and spironolactone 08/31/22 TTE shows LVEF 25 to 30%, severely decreased LV function, moderate LV hypertrophy.  Moderate MR  Clinically no signs of volume overload   Elevated troponin  - due to type II NSTEMI, demand ischemia with A-fib RVR,  briefly in Torsades Not ACS.  No chest pain. Cardiology follow up   Uncontrolled type 2 diabetes mellitus with hyperglycemia, without long-term current use of insulin ([place]) Resume home Januvia at discharge. Having some hyperglycemia during admission.   Discharging with insulin - Novolog 10 units TID w/ meals, hold if <50% meal is consumed. Monitor blood sugars TID AC & HS Close Primary Care follow up for regimen adjustments   Stage 3a chronic kidney disease ([place]) Renal function at baseline Repeat BMP in 1-2 weeks   Constipation, continue scheduled and PRN bowel regimen.  Hold stool softeners / laxative if having loose or frequent stools.    Adult hypothyroidism Continue levothyroxine   Essential (primary) hypertension BP controlled, occassionally soft BP's with Cardizem.   Continue home antihypertensives Monitor BP closely.  Check BP before giving Cardizem, see hold parameters on order.     Peripheral neuropathy, continued gabapentin, dose reduced to 300 mg p.o. 3 times daily (prior home dose was 600 mg).  Consider adjusting dose if neuropathy uncontrolled.  Recommend lowest possible dose due to side effects.   Bilateral sensorineural hearing loss, very hard of hearing.   History of malignant gastrointestinal stromal tumor (GIST) No acute issues during admission    Nutrition Problem: Increased nutrient needs Body mass index is 22.66 kg/m.  Etiology: post-op healing Interventions: Interventions: Ensure Enlive (each supplement provides 350kcal and 20 grams of protein), MVI      Diet: Regular diet + Glucerna supplement drinks DVT Prophylaxis: Eliquis & TED's   Advance goals of care discussion: DNR          Consultants: Orthopedic surgery, Cardiology Procedures performed: ORIF to right femur periprosthetic fracture   Disposition: Skilled nursing facility   Diet recommendation: Heart healthy and LOW POTASSIUM    Cardiac diet DISCHARGE MEDICATION: Allergies  as of 09/07/2022       Reactions   Iodinated Contrast Media Rash   Augmentin [amoxicillin-pot Clavulanate] Other (See Comments)   Gi distress   Codeine    Crestor [rosuvastatin] Other (See Comments)   Unknown Unknown   Levaquin [levofloxacin In D5w]    Stomach pain, body pain/ache all over Stomach pain, body pain/ache all over   Naproxen Other (See Comments)   unknown unknown   Prednisone    Sulfa Antibiotics    Sulfa Antibiotics Other (See Comments)   Sulfacetamide Sodium    Tolectin [tolmetin]         Medication List     STOP taking these medications    celecoxib 100 MG capsule Commonly known as: CELEBREX   gabapentin 600 MG tablet Commonly known as: NEURONTIN Replaced by: gabapentin 300 MG capsule   spironolactone 25 MG tablet Commonly known as: ALDACTONE       TAKE these medications    Accu-Chek Aviva Plus test strip Generic drug: glucose blood Use as instructed   Accu-Chek Softclix Lancets lancets Use as instructed   acetaminophen 500 MG tablet Commonly known as: TYLENOL Take 2 tablets (1,000 mg total) by mouth every 8 (eight) hours. What changed: when to take this   Align 4 MG Caps Take 4 mg by mouth every other day. (On digoxin days)   apixaban 2.5 MG Tabs tablet Commonly known as: ELIQUIS Take 2.5 mg by mouth 2 (two) times daily.   atorvastatin 20 MG tablet Commonly known as: LIPITOR Take 20 mg  by mouth at bedtime.   bisacodyl 5 MG EC tablet Commonly known as: DULCOLAX Take 2 tablets (10 mg total) by mouth at bedtime.   bisacodyl 10 MG suppository Commonly known as: DULCOLAX Place 1 suppository (10 mg total) rectally daily as needed for severe constipation.   blood glucose meter kit and supplies Dispense based on patient and insurance preference. Use once daily as directed. (FOR ICD-10 E11.21).   Blue-Emu Super Strength Crea Apply topically.   Calcium-Vitamin D 600-200 MG-UNIT tablet Take 1 tablet by mouth daily with  supper. What changed: how much to take   CENTRUM SILVER ULTRA WOMENS PO Take 1 tablet by mouth daily with lunch.   digoxin 0.125 MG tablet Commonly known as: LANOXIN Take 1 tablet (0.125 mg total) by mouth daily. Start taking on: September 08, 2022 What changed: when to take this   diltiazem 30 MG tablet Commonly known as: CARDIZEM Take 1 tablet (30 mg total) by mouth every 8 (eight) hours. Hold if MAP<65 or SBP<110 or DBP<50   feeding supplement (GLUCERNA SHAKE) Liqd Take 237 mLs by mouth 3 (three) times daily between meals.   furosemide 20 MG tablet Commonly known as: LASIX Take 20 mg by mouth daily.   gabapentin 300 MG capsule Commonly known as: NEURONTIN Take 1 capsule (300 mg total) by mouth 3 (three) times daily with meals. Replaces: gabapentin 600 MG tablet   imipramine 25 MG tablet Commonly known as: TOFRANIL TAKE 1 TABLET BY MOUTH AT BEDTIME   insulin aspart 100 UNIT/ML injection Commonly known as: novoLOG Inject 10 Units into the skin 3 (three) times daily with meals. Hold if blood glucose < 110 or if less than 50% of meal is consumed   loperamide 2 MG capsule Commonly known as: IMODIUM Take 2 mg by mouth as needed.   Methylcellulose (Laxative) 500 MG Tabs Take 2 tablets by mouth daily.   metoprolol succinate 100 MG 24 hr tablet Commonly known as: TOPROL-XL TAKE ONE TABLET BY MOUTH EVERY DAY   mirabegron ER 50 MG Tb24 tablet Commonly known as: Myrbetriq Take 1 tablet (50 mg total) by mouth at bedtime.   omeprazole 20 MG capsule Commonly known as: PRILOSEC TAKE 1 CAPSULE BY MOUTH ONCE DAILY What changed: when to take this   ondansetron 4 MG tablet Commonly known as: ZOFRAN Take 1 tablet (4 mg total) by mouth every 6 (six) hours as needed for nausea.   polyethylene glycol 17 g packet Commonly known as: MIRALAX / GLYCOLAX Take 17 g by mouth 2 (two) times daily. Hold if loose or frequent stools   polyvinyl alcohol 1.4 % ophthalmic solution Commonly  known as: LIQUIFILM TEARS Place 1 drop into both eyes as needed for dry eyes.   sacubitril-valsartan 97-103 MG Commonly known as: ENTRESTO Take 1 tablet by mouth 2 (two) times daily.   sitaGLIPtin 100 MG tablet Commonly known as: Januvia Take 1 tablet (100 mg total) by mouth every morning.   Synthroid 50 MCG tablet Generic drug: levothyroxine Take 1 tablet (50 mcg total) by mouth daily before breakfast.   traMADol 50 MG tablet Commonly known as: Ultram Take 1 tablet (50 mg total) by mouth every 8 (eight) hours as needed (pain uncontrolled by Tylenol). Avoid if patient sleepy sedated or lethargic.  Reserve for use if persistently uncontrolled pain.        Follow-up Information     Duanne Guess, PA-C Follow up in 2 week(s).   Specialties: Orthopedic Surgery, Emergency Medicine Contact information:  [address] [place] [address] [phone]                Discharge Exam: Danley Danker Weights   08/30/22 0656 08/31/22 1121  Weight: 67.6 kg 67.6 kg   General exam: somnolent but wakes easily, no acute distress HEENT: right lower eye lid erythema improving, moist mucus membranes, hearing grossly normal  Respiratory system: CTAB, no wheezes, rales or rhonchi, normal respiratory effort. Cardiovascular system: normal S1/S2, irregular rhythm, regular rate.   Gastrointestinal system: soft, NT, ND, no HSM felt, +bowel sounds. Central nervous system: A&O x self and place at least. no gross focal neurologic deficits, normal speech Extremities: moves all, no edema, normal tone Skin: dry, intact, normal temperature Psychiatry: normal mood, congruent affect, judgement and insight appear normal   Condition at discharge: stable  The results of significant diagnostics from this hospitalization (including imaging, microbiology, ancillary and laboratory) are listed below for reference.   Imaging Studies: DG FEMUR, MIN 2 VIEWS RIGHT  Result Date: 09/01/2022 CLINICAL  DATA:  Femur fracture.  Status post surgery. EXAM: RIGHT FEMUR 2 VIEWS COMPARISON:  Prior studies FINDINGS: Interval placement of LATERAL plate with numerous cortical screws and cerclage wires traversing the proximal aspect of the femur. Prior hip replacement. Visualized portion of the pelvis is intact. Remote knee arthroplasty. IMPRESSION: Status post ORIF of proximal femur fracture. Electronically Signed   By: Nolon Nations M.D.   On: 09/01/2022 14:49   DG Chest Port 1 View  Result Date: 09/01/2022 CLINICAL DATA:  Pleural effusion. EXAM: PORTABLE CHEST 1 VIEW COMPARISON:  08/30/2022 FINDINGS: Low lung volumes accounting for known hyperinflation. Heart size is normal. There are minimal patchy opacities at the lung bases, partially obscuring the hemidiaphragms. Small bilateral pleural effusions are present. Thickening of interstitial lines suggest mild pulmonary edema. There is no pneumothorax. IMPRESSION: 1. Small bilateral pleural effusions. 2. Mild pulmonary edema. 3. Minimal patchy opacities at the lung bases, likely atelectasis. Electronically Signed   By: Nolon Nations M.D.   On: 09/01/2022 10:06   DG FEMUR, MIN 2 VIEWS RIGHT  Result Date: 08/31/2022 CLINICAL DATA:  Surgery, elective EXAM: RIGHT FEMUR 2 VIEWS COMPARISON:  Femur CT 08/30/2022 FINDINGS: Intraoperative images during plate and cerclage wire fixation of the proximal femur for a periprosthetic fracture. Intact hardware. No evidence of immediate complication. Improved fracture alignment. IMPRESSION: Intraoperative images during plate and cerclage wire fixation of the proximal femur for a periprosthetic fracture. Improved fracture alignment. No evidence of immediate complication. Electronically Signed   By: Maurine Simmering M.D.   On: 08/31/2022 16:24   DG C-Arm 1-60 Min-No Report  Result Date: 08/31/2022 Fluoroscopy was utilized by the requesting physician.  No radiographic interpretation.   ECHOCARDIOGRAM COMPLETE  Result Date:  08/31/2022    ECHOCARDIOGRAM REPORT   Patient Name:   CLOTEE SCHLICKER Date of Exam: 08/31/2022 Medical Rec #:  [id number]    Height:       68.0 in Accession #:    [id number]   Weight:       149.0 lb Date of Birth:  1929-02-20    BSA:          1.804 m Patient Age:    31 years     BP:           157/72 mmHg Patient Gender: F            HR:           87 bpm. Exam Location:  [place]  Procedure: 2D Echo, Color Doppler and Cardiac Doppler Indications:     CHF-acute systolic B51.02  History:         Patient has prior history of Echocardiogram examinations, most                  recent 02/21/2019. CHF, Arrythmias:Atrial Fibrillation; Risk                  Factors:Hypertension.  Sonographer:     Sherrie Sport Referring Phys:  [id number] Val Riles Diagnosing Phys: Kathlyn Sacramento MD  Sonographer Comments: Image quality was good. IMPRESSIONS  1. Left ventricular ejection fraction, by estimation, is 25 to 30%. The left ventricle has severely decreased function. Left ventricular endocardial border not optimally defined to evaluate regional wall motion. There is moderate left ventricular hypertrophy. Left ventricular diastolic parameters are indeterminate.  2. Right ventricular systolic function is normal. The right ventricular size is normal. There is mildly elevated pulmonary artery systolic pressure.  3. Left atrial size was mildly dilated.  4. Right atrial size was mildly dilated.  5. Moderate pleural effusion in the left lateral region.  6. The mitral valve is abnormal. Moderate mitral valve regurgitation. No evidence of mitral stenosis. Moderate mitral annular calcification.  7. The aortic valve is normal in structure. Aortic valve regurgitation is not visualized. Aortic valve sclerosis/calcification is present, without any evidence of aortic stenosis.  8. The inferior vena cava is normal in size with greater than 50% respiratory variability, suggesting right atrial pressure of 3 mmHg. FINDINGS  Left Ventricle: Left ventricular ejection  fraction, by estimation, is 25 to 30%. The left ventricle has severely decreased function. Left ventricular endocardial border not optimally defined to evaluate regional wall motion. The left ventricular internal cavity size was normal in size. There is moderate left ventricular hypertrophy. Left ventricular diastolic parameters are indeterminate. Right Ventricle: The right ventricular size is normal. No increase in right ventricular wall thickness. Right ventricular systolic function is normal. There is mildly elevated pulmonary artery systolic pressure. The tricuspid regurgitant velocity is 3.16  m/s, and with an assumed right atrial pressure of 3 mmHg, the estimated right ventricular systolic pressure is 24.2 mmHg. Left Atrium: Left atrial size was mildly dilated. Right Atrium: Right atrial size was mildly dilated. Pericardium: There is no evidence of pericardial effusion. Mitral Valve: The mitral valve is abnormal. There is severe thickening of the mitral valve leaflet(s). There is moderate calcification of the mitral valve leaflet(s). Moderate mitral annular calcification. Moderate mitral valve regurgitation. No evidence  of mitral valve stenosis. Tricuspid Valve: The tricuspid valve is normal in structure. Tricuspid valve regurgitation is mild . No evidence of tricuspid stenosis. Aortic Valve: The aortic valve is normal in structure. Aortic valve regurgitation is not visualized. Aortic valve sclerosis/calcification is present, without any evidence of aortic stenosis. Aortic valve mean gradient measures 2.3 mmHg. Aortic valve peak  gradient measures 3.6 mmHg. Aortic valve area, by VTI measures 2.13 cm. Pulmonic Valve: The pulmonic valve was normal in structure. Pulmonic valve regurgitation is mild. No evidence of pulmonic stenosis. Aorta: The aortic root is normal in size and structure. Venous: The inferior vena cava is normal in size with greater than 50% respiratory variability, suggesting right atrial  pressure of 3 mmHg. IAS/Shunts: No atrial level shunt detected by color flow Doppler. Additional Comments: There is a moderate pleural effusion in the left lateral region.  LEFT VENTRICLE PLAX 2D LVIDd:         4.10 cm LVIDs:  3.30 cm LV PW:         1.20 cm LV IVS:        1.60 cm LVOT diam:     2.00 cm LV SV:         32 LV SV Index:   18 LVOT Area:     3.14 cm  LV Volumes (MOD) LV vol d, MOD A2C: 77.5 ml LV vol d, MOD A4C: 109.0 ml LV vol s, MOD A2C: 62.9 ml LV vol s, MOD A4C: 80.2 ml LV SV MOD A2C:     14.6 ml LV SV MOD A4C:     109.0 ml LV SV MOD BP:      12.2 ml RIGHT VENTRICLE RV Basal diam:  2.60 cm RV Mid diam:    2.00 cm RV S prime:     11.90 cm/s TAPSE (M-mode): 1.7 cm LEFT ATRIUM            Index        RIGHT ATRIUM           Index LA diam:      3.70 cm  2.05 cm/m   RA Area:     21.10 cm LA Vol (A2C): 101.0 ml 56.00 ml/m  RA Volume:   62.50 ml  34.65 ml/m LA Vol (A4C): 33.8 ml  18.74 ml/m  AORTIC VALVE AV Area (Vmax):    2.19 cm AV Area (Vmean):   2.12 cm AV Area (VTI):     2.13 cm AV Vmax:           95.10 cm/s AV Vmean:          64.933 cm/s AV VTI:            0.149 m AV Peak Grad:      3.6 mmHg AV Mean Grad:      2.3 mmHg LVOT Vmax:         66.30 cm/s LVOT Vmean:        43.800 cm/s LVOT VTI:          0.101 m LVOT/AV VTI ratio: 0.68  AORTA Ao Root diam: 2.87 cm MITRAL VALVE                TRICUSPID VALVE MV Area (PHT): 4.89 cm     TR Peak grad:   39.9 mmHg MV Decel Time: 155 msec     TR Vmax:        316.00 cm/s MV E velocity: 121.00 cm/s                             SHUNTS                             Systemic VTI:  0.10 m                             Systemic Diam: 2.00 cm Kathlyn Sacramento MD Electronically signed by Kathlyn Sacramento MD Signature Date/Time: 08/31/2022/11:13:32 AM    Final    CT FEMUR RIGHT WO CONTRAST  Result Date: 08/30/2022 CLINICAL DATA:  Right femoral fracture EXAM: CT OF THE LOWER RIGHT EXTREMITY WITHOUT CONTRAST TECHNIQUE: Multidetector CT imaging of the right lower  extremity was performed according to the standard protocol. RADIATION DOSE REDUCTION: This exam was performed according to the departmental dose-optimization program which includes automated exposure control,  adjustment of the mA and/or kV according to patient size and/or use of iterative reconstruction technique. COMPARISON:  None Available. FINDINGS: Bones/Joint/Cartilage Right hip bipolar hemiarthroplasty has been performed. There is an oblique periprosthetic fracture of the proximal femoral diaphysis involving the distal femoral stem component. In this region, there is endosteal scalloping and thinning of the femoral diaphyseal cortex, particularly posteromedially, best appreciated on coronal image # 121/10 and axial image # 131/4 which may relate to motion, aggressive granulomatosis or infection. Fracture plane extends through the methylmethacrylate plug at the distal tip of the femoral stem component. Distal fracture fragment demonstrates 1/2 shaft with lateral displacement and mild medial angulation. No hip dislocation. Right total knee arthroplasty noted with arthroplasty components in expected alignment. Osseous structures are diffusely osteopenic. Ligaments Suboptimally assessed by CT. Muscles and Tendons Moderate diffuse fatty atrophy. Streak artifact limits evaluation of the psoas tendon. Gluteal, adductor and hamstring tendons appear intact. Quadriceps and patellar tendons appear intact. Soft tissues Hyperdense intramuscular hematoma surrounds the fracture plane within the vastus musculature. Advanced vascular calcifications noted within the lower extremity arterial outflow. Scattered calcifications are also noted within the right popliteal and peripheral femoral vein likely the sequela of remote thrombosis or inflammation. IMPRESSION: 1. Oblique periprosthetic fracture of the proximal femoral diaphysis involving the distal femoral stem component of the right hip bipolar hemiarthroplasty. In this  region, there is endosteal scalloping and thinning of the femoral diaphyseal cortex, particularly posteromedially, which may relate to motion, aggressive granulomatosis or infection. 2. Hyperdense intramuscular hematoma surrounds the fracture plane within the vastus musculature. 3. Right total knee arthroplasty with arthroplasty components in expected alignment. 4. Advanced vascular calcifications within the lower extremity arterial outflow. 5. Scattered calcifications within the right popliteal and peripheral femoral vein likely the sequela of remote thrombosis or inflammation. Electronically Signed   By: Fidela Salisbury M.D.   On: 08/30/2022 04:16   DG FEMUR, MIN 2 VIEWS RIGHT  Result Date: 08/30/2022 CLINICAL DATA:  Right femur fracture EXAM: RIGHT FEMUR 2 VIEWS COMPARISON:  None Available. FINDINGS: There is no fracture or dislocation of the distal right femur. Periprosthetic fracture of the proximal right femur is unchanged. The components of the right total knee arthroplasty are normally position. IMPRESSION: Periprosthetic fracture of the proximal right femur is unchanged. No fracture or dislocation of the distal right femur. Electronically Signed   By: Ulyses Jarred M.D.   On: 08/30/2022 03:40   DG Hip Unilat W or Wo Pelvis 2-3 Views Right  Result Date: 08/30/2022 CLINICAL DATA:  Fall EXAM: DG HIP (WITH OR WITHOUT PELVIS) 2-3V RIGHT COMPARISON:  None Available. FINDINGS: Right hip hemiarthroplasty. There is a periprosthetic fracture with medial angulation and oblique orientation at the distal aspect of the prosthetic femoral stem. No hip dislocation. IMPRESSION: Periprosthetic fracture with medial angulation and oblique orientation at the distal aspect of the prosthetic femoral stem. Electronically Signed   By: Ulyses Jarred M.D.   On: 08/30/2022 02:33   DG Chest Port 1 View  Result Date: 08/30/2022 CLINICAL DATA:  Preop chest radiograph. EXAM: PORTABLE CHEST 1 VIEW COMPARISON:  Chest radiograph  dated 05/21/2015. FINDINGS: No focal consolidation, pleural effusion, or pneumothorax. Mild cardiomegaly. There is calcification of the mitral annulus. Atherosclerotic calcification of the aortic arch. Degenerative changes of the spine. No acute osseous pathology. IMPRESSION: 1. No active disease. 2. Mild cardiomegaly. Electronically Signed   By: Anner Crete M.D.   On: 08/30/2022 02:32    Microbiology: Results for orders placed or performed in  visit on 02/18/22  Urine Culture     Status: None   Collection Time: 02/19/22 12:00 AM   Specimen: Urine   Urine  Result Value Ref Range Status   Urine Culture, Routine Final report  Final   Organism ID, Bacteria Comment  Final    Comment: Mixed urogenital flora Greater than 100,000 colony forming units per mL    ORGANISM ID, BACTERIA Not applicable  Final    Labs: CBC: Recent Labs  Lab 09/03/22 0621 09/04/22 0626 09/05/22 0555 09/06/22 0505 09/07/22 0521  WBC 9.8 11.3* 13.8* 11.5* 12.0*  HGB 8.1* 8.7* 9.3* 9.7* 9.5*  HCT 24.1* 26.8* 28.4* 29.5* 29.9*  MCV 96.8 97.5 96.3 96.4 98.4  PLT 118* 180 229 224 829   Basic Metabolic Panel: Recent Labs  Lab 09/01/22 0243 09/02/22 0454 09/03/22 0621 09/04/22 0626 09/04/22 1206 09/05/22 0555 09/06/22 0505 09/07/22 0521  NA 136 134* 132* 136 133* 132* 134* 136  K 4.5 4.7 4.5 5.3* 5.1 5.3* 4.9 4.6  CL 100 99 100 100 97* 95* 99 98  CO2 '23 27 27 27 26 26 29 28  '$ GLUCOSE 280* 195* 199* 232* 239* 244* 209* 182*  BUN 21 24* 30* 29* 31* 31* 33* 36*  CREATININE 0.99 1.08* 1.16* 1.05* 1.01* 1.05* 1.10* 1.20*  CALCIUM 8.6* 8.7* 8.4* 8.6* 8.4* 8.4* 8.6* 8.6*  MG 1.8 2.5* 2.2 1.9  --  2.0 1.9 1.8  PHOS 3.8 3.4 3.0 2.7  --  3.2  --   --    Liver Function Tests: Recent Labs  Lab 09/01/22 0243  AST 38  ALT 12  ALKPHOS 45  BILITOT 0.9  PROT 6.2*  ALBUMIN 3.4*   CBG: Recent Labs  Lab 09/06/22 0835 09/06/22 1326 09/06/22 1600 09/06/22 2119 09/07/22 0831  GLUCAP 211* 191* 216* 83  205*    Discharge time spent: greater than 30 minutes.  Signed: Ezekiel Slocumb, DO [facility] 09/07/2022

## 2022-10-22 ENCOUNTER — Ambulatory Visit: Payer: Medicare PPO | Admitting: Cardiovascular Disease

## 2022-10-23 ENCOUNTER — Encounter: Payer: Self-pay | Admitting: Family Medicine

## 2022-10-23 ENCOUNTER — Inpatient Hospital Stay
Admission: EM | Admit: 2022-10-23 | Discharge: 2022-11-09 | DRG: 951 | Disposition: E | Payer: Medicare PPO | Attending: Internal Medicine | Admitting: Internal Medicine

## 2022-10-23 ENCOUNTER — Other Ambulatory Visit: Payer: Self-pay

## 2022-10-23 ENCOUNTER — Emergency Department: Payer: Medicare PPO

## 2022-10-23 DIAGNOSIS — R5383 Other fatigue: Secondary | ICD-10-CM | POA: Diagnosis present

## 2022-10-23 DIAGNOSIS — Z800 Family history of malignant neoplasm of digestive organs: Secondary | ICD-10-CM

## 2022-10-23 DIAGNOSIS — Z90722 Acquired absence of ovaries, bilateral: Secondary | ICD-10-CM | POA: Diagnosis not present

## 2022-10-23 DIAGNOSIS — Z96651 Presence of right artificial knee joint: Secondary | ICD-10-CM | POA: Diagnosis present

## 2022-10-23 DIAGNOSIS — K219 Gastro-esophageal reflux disease without esophagitis: Secondary | ICD-10-CM | POA: Diagnosis present

## 2022-10-23 DIAGNOSIS — K449 Diaphragmatic hernia without obstruction or gangrene: Secondary | ICD-10-CM | POA: Diagnosis present

## 2022-10-23 DIAGNOSIS — J9601 Acute respiratory failure with hypoxia: Principal | ICD-10-CM

## 2022-10-23 DIAGNOSIS — Z7984 Long term (current) use of oral hypoglycemic drugs: Secondary | ICD-10-CM

## 2022-10-23 DIAGNOSIS — Z515 Encounter for palliative care: Secondary | ICD-10-CM | POA: Diagnosis present

## 2022-10-23 DIAGNOSIS — S7290XD Unspecified fracture of unspecified femur, subsequent encounter for closed fracture with routine healing: Secondary | ICD-10-CM

## 2022-10-23 DIAGNOSIS — Z8249 Family history of ischemic heart disease and other diseases of the circulatory system: Secondary | ICD-10-CM | POA: Diagnosis not present

## 2022-10-23 DIAGNOSIS — Z79890 Hormone replacement therapy (postmenopausal): Secondary | ICD-10-CM

## 2022-10-23 DIAGNOSIS — Z79899 Other long term (current) drug therapy: Secondary | ICD-10-CM

## 2022-10-23 DIAGNOSIS — Z833 Family history of diabetes mellitus: Secondary | ICD-10-CM

## 2022-10-23 DIAGNOSIS — I110 Hypertensive heart disease with heart failure: Secondary | ICD-10-CM | POA: Diagnosis present

## 2022-10-23 DIAGNOSIS — Z823 Family history of stroke: Secondary | ICD-10-CM

## 2022-10-23 DIAGNOSIS — E1142 Type 2 diabetes mellitus with diabetic polyneuropathy: Secondary | ICD-10-CM | POA: Diagnosis present

## 2022-10-23 DIAGNOSIS — Z885 Allergy status to narcotic agent status: Secondary | ICD-10-CM

## 2022-10-23 DIAGNOSIS — I700 Atherosclerosis of aorta: Secondary | ICD-10-CM | POA: Diagnosis present

## 2022-10-23 DIAGNOSIS — Z66 Do not resuscitate: Secondary | ICD-10-CM | POA: Diagnosis present

## 2022-10-23 DIAGNOSIS — Z90710 Acquired absence of both cervix and uterus: Secondary | ICD-10-CM | POA: Diagnosis not present

## 2022-10-23 DIAGNOSIS — Z886 Allergy status to analgesic agent status: Secondary | ICD-10-CM

## 2022-10-23 DIAGNOSIS — Z794 Long term (current) use of insulin: Secondary | ICD-10-CM

## 2022-10-23 DIAGNOSIS — U071 COVID-19: Secondary | ICD-10-CM | POA: Diagnosis present

## 2022-10-23 DIAGNOSIS — J1282 Pneumonia due to coronavirus disease 2019: Secondary | ICD-10-CM | POA: Insufficient documentation

## 2022-10-23 DIAGNOSIS — Z9079 Acquired absence of other genital organ(s): Secondary | ICD-10-CM

## 2022-10-23 DIAGNOSIS — Z888 Allergy status to other drugs, medicaments and biological substances status: Secondary | ICD-10-CM

## 2022-10-23 DIAGNOSIS — I509 Heart failure, unspecified: Secondary | ICD-10-CM | POA: Diagnosis present

## 2022-10-23 DIAGNOSIS — Z85828 Personal history of other malignant neoplasm of skin: Secondary | ICD-10-CM | POA: Diagnosis not present

## 2022-10-23 DIAGNOSIS — Z882 Allergy status to sulfonamides status: Secondary | ICD-10-CM

## 2022-10-23 DIAGNOSIS — Z881 Allergy status to other antibiotic agents status: Secondary | ICD-10-CM

## 2022-10-23 DIAGNOSIS — Z7901 Long term (current) use of anticoagulants: Secondary | ICD-10-CM

## 2022-10-23 DIAGNOSIS — M1990 Unspecified osteoarthritis, unspecified site: Secondary | ICD-10-CM | POA: Diagnosis present

## 2022-10-23 DIAGNOSIS — I4891 Unspecified atrial fibrillation: Secondary | ICD-10-CM | POA: Diagnosis present

## 2022-10-23 DIAGNOSIS — Z91041 Radiographic dye allergy status: Secondary | ICD-10-CM

## 2022-10-23 LAB — COMPREHENSIVE METABOLIC PANEL
ALT: 28 U/L (ref 0–44)
AST: 61 U/L — ABNORMAL HIGH (ref 15–41)
Albumin: 2.7 g/dL — ABNORMAL LOW (ref 3.5–5.0)
Alkaline Phosphatase: 153 U/L — ABNORMAL HIGH (ref 38–126)
Anion gap: 17 — ABNORMAL HIGH (ref 5–15)
BUN: 42 mg/dL — ABNORMAL HIGH (ref 8–23)
CO2: 21 mmol/L — ABNORMAL LOW (ref 22–32)
Calcium: 8.6 mg/dL — ABNORMAL LOW (ref 8.9–10.3)
Chloride: 99 mmol/L (ref 98–111)
Creatinine, Ser: 1.69 mg/dL — ABNORMAL HIGH (ref 0.44–1.00)
GFR, Estimated: 28 mL/min — ABNORMAL LOW (ref >60)
Glucose, Bld: 245 mg/dL — ABNORMAL HIGH (ref 70–99)
Potassium: 5.9 mmol/L — ABNORMAL HIGH (ref 3.5–5.1)
Sodium: 137 mmol/L (ref 135–145)
Total Bilirubin: 0.7 mg/dL (ref 0.3–1.2)
Total Protein: 6.4 g/dL — ABNORMAL LOW (ref 6.5–8.1)

## 2022-10-23 LAB — CBC WITH DIFFERENTIAL/PLATELET
Abs Immature Granulocytes: 0.03 10*3/uL (ref 0.00–0.07)
Basophils Absolute: 0 10*3/uL (ref 0.0–0.1)
Basophils Relative: 0 %
Eosinophils Absolute: 0 10*3/uL (ref 0.0–0.5)
Eosinophils Relative: 1 %
HCT: 39 % (ref 36.0–46.0)
Hemoglobin: 11.6 g/dL — ABNORMAL LOW (ref 12.0–15.0)
Immature Granulocytes: 1 %
Lymphocytes Relative: 35 %
Lymphs Abs: 2.1 10*3/uL (ref 0.7–4.0)
MCH: 30.4 pg (ref 26.0–34.0)
MCHC: 29.7 g/dL — ABNORMAL LOW (ref 30.0–36.0)
MCV: 102.1 fL — ABNORMAL HIGH (ref 80.0–100.0)
Monocytes Absolute: 0.3 10*3/uL (ref 0.1–1.0)
Monocytes Relative: 4 %
Neutro Abs: 3.5 10*3/uL (ref 1.7–7.7)
Neutrophils Relative %: 59 %
Platelets: 147 10*3/uL — ABNORMAL LOW (ref 150–400)
RBC: 3.82 MIL/uL — ABNORMAL LOW (ref 3.87–5.11)
RDW: 13.9 % (ref 11.5–15.5)
WBC: 5.9 10*3/uL (ref 4.0–10.5)
nRBC: 0 % (ref 0.0–0.2)

## 2022-10-23 MED ORDER — POLYVINYL ALCOHOL 1.4 % OP SOLN: 1.0000 [drp] | Freq: Four times a day (QID) | OPHTHALMIC | Status: DC | PRN

## 2022-10-23 MED ORDER — GLYCOPYRROLATE 1 MG PO TABS: 1.0000 mg | ORAL_TABLET | ORAL | Status: DC | PRN

## 2022-10-23 MED ORDER — BIOTENE DRY MOUTH MT LIQD: 15.0000 mL | OROMUCOSAL | Status: DC | PRN

## 2022-10-23 MED ORDER — IPRATROPIUM-ALBUTEROL 0.5-2.5 (3) MG/3ML IN SOLN: 3.0000 mL | RESPIRATORY_TRACT | Status: DC | PRN

## 2022-10-23 MED ORDER — HALOPERIDOL LACTATE 5 MG/ML IJ SOLN: 0.5000 mg | INTRAMUSCULAR | Status: DC | PRN

## 2022-10-23 MED ORDER — LORAZEPAM 2 MG/ML PO CONC: 1.0000 mg | ORAL | Status: DC | PRN

## 2022-10-23 MED ORDER — ONDANSETRON 4 MG PO TBDP: 4.0000 mg | ORAL_TABLET | Freq: Four times a day (QID) | ORAL | Status: DC | PRN

## 2022-10-23 MED ORDER — SODIUM CHLORIDE 0.9 % IV SOLN: INTRAVENOUS | Status: DC

## 2022-10-23 MED ORDER — GLYCOPYRROLATE 0.2 MG/ML IJ SOLN: 0.2000 mg | INTRAMUSCULAR | Status: DC | PRN

## 2022-10-23 MED ORDER — HALOPERIDOL LACTATE 2 MG/ML PO CONC: 0.5000 mg | ORAL | Status: DC | PRN

## 2022-10-23 MED ORDER — ACETAMINOPHEN 325 MG RE SUPP
650.0000 mg | Freq: Once | RECTAL | Status: AC
  Administered 2022-10-23: 650 mg via RECTAL
  Filled 2022-10-23: qty 2

## 2022-10-23 MED ORDER — ACETAMINOPHEN 325 MG PO TABS: 650.0000 mg | ORAL_TABLET | Freq: Four times a day (QID) | ORAL | Status: DC | PRN

## 2022-10-23 MED ORDER — LORAZEPAM 2 MG/ML IJ SOLN: 1.0000 mg | INTRAMUSCULAR | Status: DC | PRN

## 2022-10-23 MED ORDER — HALOPERIDOL 0.5 MG PO TABS: 0.5000 mg | ORAL_TABLET | ORAL | Status: DC | PRN

## 2022-10-23 MED ORDER — LORAZEPAM 1 MG PO TABS: 1.0000 mg | ORAL_TABLET | ORAL | Status: DC | PRN

## 2022-10-23 MED ORDER — GLYCOPYRROLATE 0.2 MG/ML IJ SOLN
0.2000 mg | Freq: Once | INTRAMUSCULAR | Status: AC
  Administered 2022-10-23: 0.2 mg via INTRAVENOUS
  Filled 2022-10-23: qty 1

## 2022-10-23 MED ORDER — MORPHINE SULFATE (PF) 2 MG/ML IV SOLN: 1.0000 mg | INTRAVENOUS | Status: DC | PRN

## 2022-10-23 MED ORDER — ONDANSETRON HCL 4 MG/2ML IJ SOLN: 4.0000 mg | Freq: Four times a day (QID) | INTRAMUSCULAR | Status: DC | PRN

## 2022-10-23 MED ORDER — ACETAMINOPHEN 650 MG RE SUPP: 650.0000 mg | Freq: Four times a day (QID) | RECTAL | Status: DC | PRN

## 2022-10-23 MED ORDER — GLYCOPYRROLATE 0.2 MG/ML IJ SOLN
0.2000 mg | INTRAMUSCULAR | Status: DC | PRN
  Administered 2022-10-23: 0.2 mg via INTRAVENOUS
  Filled 2022-10-23: qty 1

## 2022-10-23 MED ORDER — MORPHINE SULFATE (PF) 2 MG/ML IV SOLN
1.0000 mg | INTRAVENOUS | Status: DC | PRN
  Administered 2022-10-23 – 2022-10-24 (×2): 1 mg via INTRAVENOUS
  Filled 2022-10-23 (×2): qty 1

## 2022-10-23 NOTE — ED Triage Notes (Signed)
Pt is COVID positive

## 2022-10-23 NOTE — H&P (Signed)
History and Physical    Patient: Dawn Burnett [id number] DOB: 1928/12/01 DOA: 10/29/2022 DOS: the patient was seen and examined on 11/06/2022 PCP: [name], MD  Patient coming from: Home  Chief Complaint:  Chief Complaint  Patient presents with   Respiratory Distress   HPI: Dawn Burnett is a 89 y.o. female with medical history significant of multiple medications include atrial fibrillation, CHF, type 2 diabetes, GERD, hypertension presenting with COVID-19 pneumonia and comfort care.  History from staff as well as daughter [name].  Per report, patient recently diagnosed with COVID-19 as well as pneumonia.  Also with recent hip fracture.  Progressive worsening decline.  Multiple family members now with COVID-19 per report.  Overall worsening respiratory status.  Presented to the ER Tmax of 103.1, heart rate in the 150s, BP 100s over 70s.  Satting in the 60s on room air.  Transition to a nonrebreather.  White count 5.9, hemoglobin 7.6, platelets 147.  Potassium 5.9, creatinine 1.7.  Chest x-ray worsening left greater than right airspace opacities concerning for pneumonia with left greater than right pleural effusions.  Family wishes to make patient comfort care.   Review of Systems: As mentioned in the history of present illness. All other systems reviewed and are negative. Past Medical History:  Diagnosis Date   Arthritis    Atrial fibrillation (HCC)    CHF (congestive heart failure) (HCC)    Diabetes mellitus without complication (HCC)    GERD (gastroesophageal reflux disease)    Hiatal hernia    Hypertension    Neuropathy    Skin cancer, basal cell    Thyroid disease    Past Surgical History:  Procedure Laterality Date   ABDOMINAL HYSTERECTOMY     APPENDECTOMY     BREAST SURGERY     cateract extraction     EYE SURGERY     done by Dr. 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 She has never used smokeless tobacco. She reports that she does not drink alcohol and does not use drugs.  Allergies  Allergen Reactions   Iodinated Contrast Media Rash   Augmentin [Amoxicillin-Pot Clavulanate] Other (See Comments)    Gi distress    Codeine    Crestor [Rosuvastatin] Other (See Comments)    Unknown Unknown   Levaquin [Levofloxacin In D5w]     Stomach pain, body pain/ache all over Stomach pain, body pain/ache all over   Naproxen Other (See Comments)    unknown unknown   Prednisone    Sulfa Antibiotics    Sulfa Antibiotics Other (See Comments)   Sulfacetamide Sodium    Tolectin [Tolmetin]     Family History  Problem Relation Age of Onset   Heart disease Mother    Hypertension Mother    Heart attack Mother    Stomach cancer Father    Liver cancer Brother    Hypertension Son    Huntington's disease Sister    Diabetes Sister    Kidney disease Sister    Congestive Heart Failure  Sister    Pneumonia Sister    Stroke Brother    Parkinson's disease Brother    Hypertension Brother    Congestive Heart Failure Brother     Prior to Admission medications   Medication Sig Start Date End Date Taking? Authorizing Provider  acetaminophen (TYLENOL) 500 MG tablet Take 2 tablets (1,000 mg total) by mouth every 8 (eight) hours. 09/07/22  Yes Ezekiel Slocumb, DO  apixaban (ELIQUIS) 2.5 MG TABS tablet Take 2.5 mg by mouth 2 (two) times daily.   Yes [provider]  atorvastatin (LIPITOR) 20 MG tablet Take 20 mg by mouth at bedtime.    Yes [provider]  bisacodyl (DULCOLAX) 10 MG suppository Place 1  suppository (10 mg total) rectally daily as needed for severe constipation. 09/07/22  Yes Nicole Kindred A, DO  bisacodyl (DULCOLAX) 5 MG EC tablet Take 2 tablets (10 mg total) by mouth at bedtime. 09/07/22  Yes Nicole Kindred A, DO  Calcium-Vitamin D 600-200 MG-UNIT tablet Take 1 tablet by mouth daily with supper. 09/07/22  Yes Nicole Kindred A, DO  celecoxib (CELEBREX) 200 MG capsule Take 200 mg by mouth daily. 10/21/22  Yes [provider]  dextromethorphan-guaiFENesin (MUCINEX DM) 30-600 MG 12hr tablet Take 1 tablet by mouth 2 (two) times daily.   Yes [provider]  digoxin (LANOXIN) 0.125 MG tablet Take 1 tablet (0.125 mg total) by mouth daily. Patient taking differently: Take 0.125 mg by mouth every other day. On even days 09/08/22  Yes Nicole Kindred A, DO  diltiazem (CARDIZEM) 30 MG tablet Take 1 tablet (30 mg total) by mouth every 8 (eight) hours. Hold if MAP<65 or SBP<110 or DBP<50 09/07/22  Yes Nicole Kindred A, DO  doxycycline (VIBRA-TABS) 100 MG tablet Take 100 mg by mouth 2 (two) times daily. 03/13-03/20 10/21/22  Yes [provider]  furosemide (LASIX) 20 MG tablet Take 20 mg by mouth daily.    Yes [provider]  gabapentin (NEURONTIN) 300 MG capsule Take 1 capsule (300 mg total) by mouth 3 (three) times daily with meals. 09/07/22  Yes Nicole Kindred A, DO  imipramine (TOFRANIL) 25 MG tablet TAKE 1 TABLET BY MOUTH AT BEDTIME 05/12/22  Yes Birdie Sons, MD  insulin aspart (NOVOLOG) 100 UNIT/ML injection Inject 10 Units into the skin 3 (three) times daily with meals. Hold if blood glucose < 110 or if less than 50% of meal is consumed 09/07/22  Yes Nicole Kindred A, DO  ipratropium-albuterol (DUONEB) 0.5-2.5 (3) MG/3ML SOLN Take 3 mLs by nebulization in the morning and at bedtime. 10/20/22  Yes [provider]  Liniments (BLUE-EMU SUPER STRENGTH) CREA Apply topically.   Yes [provider]  loperamide (IMODIUM) 2 MG capsule Take 2  mg by mouth as needed.    Yes [provider]  Methylcellulose, Laxative, 500 MG TABS Take 2 tablets by mouth daily.   Yes [provider]  metoprolol succinate (TOPROL-XL) 100 MG 24 hr tablet TAKE ONE TABLET BY MOUTH EVERY DAY 01/20/21  Yes Eulas Post, MD  mirabegron ER (MYRBETRIQ) 50 MG TB24 tablet Take 1 tablet (50 mg total) by mouth at bedtime. 09/07/22  Yes Ezekiel Slocumb, DO  Multiple Vitamins-Minerals (CENTRUM SILVER ULTRA WOMENS PO) Take 1 tablet by mouth daily with lunch.    Yes [provider]  omeprazole (PRILOSEC) 20 MG capsule TAKE 1 CAPSULE BY MOUTH ONCE DAILY Patient taking differently: Take 20 mg by mouth daily before breakfast. 11/17/21  Yes  Eulas Post, MD  polyethylene glycol (MIRALAX / GLYCOLAX) 17 g packet Take 17 g by mouth 2 (two) times daily. Hold if loose or frequent stools 09/07/22  Yes Nicole Kindred A, DO  polyvinyl alcohol (LIQUIFILM TEARS) 1.4 % ophthalmic solution Place 1 drop into both eyes as needed for dry eyes. 09/07/22  Yes Ezekiel Slocumb, DO  Probiotic Product (ALIGN) 4 MG CAPS Take 4 mg by mouth every other day. (On digoxin days)   Yes [provider]  sacubitril-valsartan (ENTRESTO) 97-103 MG Take 1 tablet by mouth 2 (two) times daily.   Yes [provider]  sitaGLIPtin (JANUVIA) 100 MG tablet Take 1 tablet (100 mg total) by mouth every morning. 07/20/22  Yes Simmons-Robinson, Makiera, MD  SYNTHROID 50 MCG tablet Take 1 tablet (50 mcg total) by mouth daily before breakfast. 08/27/22  Yes Simmons-Robinson, Makiera, MD  traMADol (ULTRAM) 50 MG tablet Take 1 tablet (50 mg total) by mouth every 8 (eight) hours as needed (pain uncontrolled by Tylenol). Avoid if patient sleepy sedated or lethargic.  Reserve for use if persistently uncontrolled pain. 09/07/22 09/07/23 Yes Ezekiel Slocumb, DO  Accu-Chek Softclix Lancets lancets Use as instructed 08/27/22   Simmons-Robinson, Riki Sheer, MD  blood glucose meter kit  and supplies Dispense based on patient and insurance preference. Use once daily as directed. (FOR ICD-10 E11.21). 12/05/19   Eulas Post, MD  feeding supplement, GLUCERNA SHAKE, (GLUCERNA SHAKE) LIQD Take 237 mLs by mouth 3 (three) times daily between meals. 09/07/22   Nicole Kindred A, DO  glucose blood (ACCU-CHEK AVIVA PLUS) test strip Use as instructed 08/27/22   Simmons-Robinson, Riki Sheer, MD  ondansetron (ZOFRAN) 4 MG tablet Take 1 tablet (4 mg total) by mouth every 6 (six) hours as needed for nausea. 09/07/22   Ezekiel Slocumb, DO    Physical Exam: Vitals:   10/25/2022 0852 10/20/2022 0900 10/19/2022 0930  1000  BP:  (!) 127/95 114/71 (!) 103/46  Pulse:  70 (!) 136 84  Resp:  (!) 27 (!) 25 (!) 23  Temp: (!) 103.1 F (39.5 C)     TempSrc: Rectal     SpO2:  94% 97% 99%   Physical Exam Constitutional:      Appearance: She is normal weight.     Comments: Positive increased work of breathing  HENT:     Nose: Nose normal.     Mouth/Throat:     Mouth: Mucous membranes are moist.  Eyes:     Pupils: Pupils are equal, round, and reactive to light.  Cardiovascular:     Rate and Rhythm: Regular rhythm. Tachycardia present.  Pulmonary:     Breath sounds: Rales present.     Comments: Positive mild to moderate increased work of breathing Abdominal:     General: Bowel sounds are normal.  Musculoskeletal:     Comments: Positive decreased general range of motion  Skin:    General: Skin is dry.  Neurological:     Comments: Lethargic at the bedside  Psychiatric:        Mood and Affect: Mood normal.     Data Reviewed:  There are no new results to review at this time. DG Chest Portable 1 View CLINICAL DATA:  Pneumonia. Unresponsiveness with agonal breathing and fever.  EXAM: PORTABLE CHEST 1 VIEW  COMPARISON:  Radiographs 09/01/2022 and 08/30/2022.  FINDINGS: 0842 hours. Patient is rotated to the left. Allowing for this, the heart size and mediastinal contours are  stable with aortic atherosclerosis.  There are diffuse left greater than right airspace opacities suspicious for pneumonia. There are enlarging left greater than right pleural effusions. No evidence of pneumothorax. The bones appear unchanged without acute findings. Telemetry leads overlie the chest.  IMPRESSION: Worsening left greater than right airspace opacities suspicious for pneumonia, possibly on the basis of aspiration. Enlarging left greater than right pleural effusions.  Electronically Signed   By: Richardean Sale M.D.   On: 11/05/2022 08:57  Lab Results  Component Value Date   WBC 5.9 10/16/2022   HGB 11.6 (L) 11/03/2022   HCT 39.0 10/27/2022   MCV 102.1 (H) 11/01/2022   PLT 147 (L) [id number]   Last metabolic panel Lab Results  Component Value Date   GLUCOSE 245 (H) 11/07/2022   NA 137 10/19/2022   K 5.9 (H) 11/06/2022   CL 99 10/18/2022   CO2 21 (L) 10/11/2022   BUN 42 (H) 10/11/2022   CREATININE 1.69 (H) 10/25/2022   GFRNONAA 28 (L) 11/07/2022   CALCIUM 8.6 (L) 11/05/2022   PHOS 3.2 09/05/2022   PROT 6.4 (L) 10/15/2022   ALBUMIN 2.7 (L) 10/31/2022   LABGLOB 2.6 02/18/2022   AGRATIO 1.6 02/18/2022   BILITOT 0.7 10/25/2022   ALKPHOS 153 (H) 10/22/2022   AST 61 (H) 10/27/2022   ALT 28 10/28/2022   ANIONGAP 17 (H) 10/29/2022    Assessment and Plan: End of life care Pt presenting w/ covid PNA and worsening resp status Given multiple comorbidities, family is wishing to place patient on comfort care measures  No heroic interventions  Will place on comfort care protocol  Prn IV morphine, haldol, glycopyrrolate  Prn duonebs    Pneumonia due to COVID-19 virus Worsening bilateral pneumonia and pleural effusions in the setting of COVID-19 with worsening O2 requirement Family is wishing for comfort care measures Will follow comfort care protocol      Advance Care Planning:   Code Status: DNR   Consults: None   Family Communication: Daughter  [name] at the bedside   Severity of Illness: The appropriate patient status for this patient is OBSERVATION. Observation status is judged to be reasonable and necessary in order to provide the required intensity of service to ensure the patient's safety. The patient's presenting symptoms, physical exam findings, and initial radiographic and laboratory data in the context of their medical condition is felt to place them at decreased risk for further clinical deterioration. Furthermore, it is anticipated that the patient will be medically stable for discharge from the hospital within 2 midnights of admission.   Author: Deneise Lever, MD 11/05/2022 11:43 AM  For on call review [email or web address].

## 2022-10-23 NOTE — Assessment & Plan Note (Signed)
Worsening bilateral pneumonia and pleural effusions in the setting of COVID-19 with worsening O2 requirement Family is wishing for comfort care measures Will follow comfort care protocol

## 2022-10-23 NOTE — Progress Notes (Signed)
  Chaplain On-Call responded to a page from [employer], who reported the patient has been placed on Comfort Care measures and her daughter is requesting Chaplain support.  Chaplain met patient's daughter Lala Lund in hallway. Chaplain offered compassionate presence and listening as [name] described the patient's lengthy time in Rehab after surgery for a hip fracture, and her current hospitalization today due to falling and breaking that same hip.  Chaplain donned necessary PPE due to COVID+ diagnosis of the patient. Chaplain provided spiritual and emotional support and prayer with [name] at the bedside; the patient is non-responsive.  Chaplain Pollyann Samples M.Div., [facility]

## 2022-10-23 NOTE — ED Triage Notes (Signed)
Pt arrives by [facility] from [facility] for unresponsiveness with agonal breathing, temp 102.5.  SPO2 48% on RA when EMS arrived, pt was being bagged upon arrival- highest spo2 per EMS was 66%. Pt is a DNR.

## 2022-10-23 NOTE — Assessment & Plan Note (Signed)
Pt presenting w/ covid PNA and worsening resp status Given multiple comorbidities, family is wishing to place patient on comfort care measures  No heroic interventions  Will place on comfort care protocol  Prn IV morphine, haldol, glycopyrrolate  Prn duonebs

## 2022-10-23 NOTE — ED Provider Notes (Signed)
[facility] Provider Note    None    (approximate)   History   Respiratory Distress   HPI  Dawn Burnett is a 89 y.o. female here with respiratory distress.  The patient arrives via EMS.  Per report, she was diagnosed with COVID-19 recently as well as pneumonia.  She recently had a hip fracture, and has been recovering from this.  She has been progressively declining.  On EMS arrival, she was satting in the 60s, and was as low as the 40s.  She has been minimally responsive.  Family was notified.  On arrival, patient arrives with BVM assisted respirations.  Confirm that she is DNR.     Physical Exam   Triage Vital Signs: ED Triage Vitals  Enc Vitals Group     BP      Pulse      Resp      Temp      Temp src      SpO2      Weight      Height      Head Circumference      Peak Flow      Pain Score      Pain Loc      Pain Edu?      Excl. in [place]?     Most recent vital signs: Vitals:   10/22/2022 0930 11/02/2022 1000  BP: 114/71 (!) 103/46  Pulse: (!) 136 84  Resp: (!) 25 (!) 23  Temp:    SpO2: 97% 99%     General: Minimally responsive, in no apparent respiratory distress, receiving BVM ventilations. CV:  Tachycardic, diminished perfusion peripherally. Resp:  Significant increased work of breathing with tachypnea and rhonchi. Abd:  No distention.  Other:  Minimally responsive.  Does moan to painful stimuli.    ED Results / Procedures / Treatments   Labs (all labs ordered are listed, but only abnormal results are displayed) Labs Reviewed  CBC WITH DIFFERENTIAL/PLATELET - Abnormal; Notable for the following components:      Result Value   RBC 3.82 (*)    Hemoglobin 11.6 (*)    MCV 102.1 (*)    MCHC 29.7 (*)    Platelets 147 (*)    All other components within normal limits  COMPREHENSIVE METABOLIC PANEL - Abnormal; Notable for the following components:   Potassium 5.9 (*)    CO2 21 (*)    Glucose, Bld 245 (*)    BUN 42 (*)     Creatinine, Ser 1.69 (*)    Calcium 8.6 (*)    Total Protein 6.4 (*)    Albumin 2.7 (*)    AST 61 (*)    Alkaline Phosphatase 153 (*)    GFR, Estimated 28 (*)    Anion gap 17 (*)    All other components within normal limits     EKG Sinus tachycardia versus ventricular tachycardia, ventricular 144.  QRS 163, QTc 517.  Wide-complex QRS, suspect tachycardia with aberrancy.   RADIOLOGY    I also independently reviewed and agree with radiologist interpretations.   PROCEDURES:  Critical Care performed: No   MEDICATIONS ORDERED IN ED: Medications  LORazepam (ATIVAN) tablet 1 mg (has no administration in time range)    Or  LORazepam (ATIVAN) 2 MG/ML concentrated solution 1 mg (has no administration in time range)    Or  LORazepam (ATIVAN) injection 1 mg (has no administration in time range)  acetaminophen (TYLENOL) tablet 650 mg (has  no administration in time range)    Or  acetaminophen (TYLENOL) suppository 650 mg (has no administration in time range)  haloperidol (HALDOL) tablet 0.5 mg (has no administration in time range)    Or  haloperidol (HALDOL) 2 MG/ML solution 0.5 mg (has no administration in time range)    Or  haloperidol lactate (HALDOL) injection 0.5 mg (has no administration in time range)  ondansetron (ZOFRAN-ODT) disintegrating tablet 4 mg (has no administration in time range)    Or  ondansetron (ZOFRAN) injection 4 mg (has no administration in time range)  glycopyrrolate (ROBINUL) tablet 1 mg (has no administration in time range)    Or  glycopyrrolate (ROBINUL) injection 0.2 mg (has no administration in time range)    Or  glycopyrrolate (ROBINUL) injection 0.2 mg (has no administration in time range)  antiseptic oral rinse (BIOTENE) solution 15 mL (has no administration in time range)  polyvinyl alcohol (LIQUIFILM TEARS) 1.4 % ophthalmic solution 1 drop (has no administration in time range)  ipratropium-albuterol (DUONEB) 0.5-2.5 (3) MG/3ML nebulizer  solution 3 mL (has no administration in time range)  0.9 %  sodium chloride infusion ( Intravenous New Bag/Given 10/16/2022 1125)  morphine (PF) 2 MG/ML injection 1 mg (has no administration in time range)  acetaminophen (TYLENOL) suppository 650 mg (650 mg Rectal Given 11/03/2022 0841)  glycopyrrolate (ROBINUL) injection 0.2 mg (0.2 mg Intravenous Given 11/07/2022 0901)     IMPRESSION / MDM / ASSESSMENT AND PLAN / ED COURSE  I reviewed the triage vital signs and the nursing notes.                              Differential diagnosis includes, but is not limited to, sepsis secondary to pneumonia, respiratory failure due to COVID-19, multiorgan failure, metabolic encephalopathy  Patient's presentation is most consistent with acute presentation with potential threat to life or bodily function.  The patient is on the cardiac monitor to evaluate for evidence of arrhythmia and/or significant heart rate changes   89 year old female here with acute, profound respiratory distress and hypoxia in the setting of COVID-19 and pneumonia.  Patient dyspneic, altered, satting in the 80s on nonrebreather on arrival.  I immediately called patient's family, including her son and daughter, who is her power of attorney.  Daughter then arrived at bedside and we had a long discussion.  Decision made to transition to comfort care given patient's age, significant hypoxia, as well as COVID diagnosis and very low likelihood of meaningful recovery.  Will admit to the hospitalist for palliative care.  Chest x-ray shows significant bilateral pneumonia.  Lab work and vitals concerning for sepsis.   FINAL CLINICAL IMPRESSION(S) / ED DIAGNOSES   Final diagnoses:  Acute respiratory failure with hypoxia ([place])  COVID-19     Rx / DC Orders   ED Discharge Orders     None        Note:  This document was prepared using Dragon voice recognition software and may include unintentional dictation errors.   Duffy Bruce,  MD 10/11/2022 [phone]

## 2022-11-09 NOTE — Progress Notes (Signed)
Pt expired, time noted 0353, daughter present, needs assessed. Provider notified and [facility] contacted. Post care done and body to be transported to morgue.

## 2022-11-09 DEATH — deceased

## 2022-12-09 NOTE — Discharge Summary (Signed)
Physician Discharge Summary   Patient: Dawn Burnett MRN: [id number] DOB: 1928-09-25  Admit date:     18-Nov-2022  Discharge date: 11/08/2022  Discharge Physician: Floydene Flock   PCP: Ronnald Ramp, MD   Recommendations at discharge:    Dawn Burnett is a 89 y.o. female with medical history significant of multiple medications include atrial fibrillation, CHF, type 2 diabetes, GERD, hypertension presenting with COVID-19 pneumonia and comfort care.  History from staff as well as daughter Eber Jones.  Per report, patient recently diagnosed with COVID-19 as well as pneumonia.  Also with recent hip fracture.  Progressive worsening decline.  Multiple family members now with COVID-19 per report.  Overall worsening respiratory status.  Presented to the ER Tmax of 103.1, heart rate in the 150s, BP 100s over 70s.  Satting in the 60s on room air.  Transition to a nonrebreather.  White count 5.9, hemoglobin 7.6, platelets 147.  Potassium 5.9, creatinine 1.7.  Chest x-ray worsening left greater than right airspace opacities concerning for pneumonia with left greater than right pleural effusions.  Family wishes to make patient comfort care.   Per nursing Richelle Ito : Pt expired, time noted 0353, daughter present, needs assessed. Provider notified and [facility] contacted   Discharge Diagnoses: Principal Problem:   End of life care Active Problems:   Pneumonia due to COVID-19 virus  Resolved Problems:   * No resolved hospital problems. *  Hospital Course: No notes on file  Assessment and Plan: * End of life care Pt presenting w/ covid PNA and worsening resp status Given multiple comorbidities, family is wishing to place patient on comfort care measures  No heroic interventions  Will place on comfort care protocol  Prn IV morphine, haldol, glycopyrrolate  Prn duonebs    Pneumonia due to COVID-19 virus Worsening bilateral pneumonia and pleural effusions in the setting of COVID-19  with worsening O2 requirement Family is wishing for comfort care measures Will follow comfort care protocol         Consultants: none  Procedures performed: None   Disposition:  Pt expired  Diet recommendation:  None  DISCHARGE MEDICATION: Allergies as of 11/07/2022       Reactions   Iodinated Contrast Media Rash   Augmentin [amoxicillin-pot Clavulanate] Other (See Comments)   Gi distress   Codeine    Crestor [rosuvastatin] Other (See Comments)   Unknown Unknown   Levaquin [levofloxacin In D5w]    Stomach pain, body pain/ache all over Stomach pain, body pain/ache all over   Naproxen Other (See Comments)   unknown unknown   Prednisone    Sulfa Antibiotics    Sulfa Antibiotics Other (See Comments)   Sulfacetamide Sodium    Tolectin [tolmetin]         Medication List     ASK your doctor about these medications    Align 4 MG Caps Take 4 mg by mouth every other day. (On digoxin days)   apixaban 2.5 MG Tabs tablet Commonly known as: ELIQUIS Take 2.5 mg by mouth 2 (two) times daily.   atorvastatin 20 MG tablet Commonly known as: LIPITOR Take 20 mg by mouth at bedtime.   Blue-Emu Super Strength Crea Apply topically.   celecoxib 200 MG capsule Commonly known as: CELEBREX Take 200 mg by mouth daily.   CENTRUM SILVER ULTRA WOMENS PO Take 1 tablet by mouth daily with lunch.   dextromethorphan-guaiFENesin 30-600 MG 12hr tablet Commonly known as: MUCINEX DM Take 1 tablet by mouth 2 (  two) times daily.   doxycycline 100 MG tablet Commonly known as: VIBRA-TABS Take 100 mg by mouth 2 (two) times daily. 03/13-03/20   furosemide 20 MG tablet Commonly known as: LASIX Take 20 mg by mouth daily.   ipratropium-albuterol 0.5-2.5 (3) MG/3ML Soln Commonly known as: DUONEB Take 3 mLs by nebulization in the morning and at bedtime.   loperamide 2 MG capsule Commonly known as: IMODIUM Take 2 mg by mouth as needed.   Methylcellulose (Laxative) 500 MG Tabs Take 2  tablets by mouth daily.   sacubitril-valsartan 97-103 MG Commonly known as: ENTRESTO Take 1 tablet by mouth 2 (two) times daily.        Discharge Exam: There were no vitals filed for this visit.   Condition at discharge: Pt expired   The results of significant diagnostics from this hospitalization (including imaging, microbiology, ancillary and laboratory) are listed below for reference.   Imaging Studies: DG Chest Portable 1 View  Result Date: 10/12/2022 CLINICAL DATA:  Pneumonia. Unresponsiveness with agonal breathing and fever. EXAM: PORTABLE CHEST 1 VIEW COMPARISON:  Radiographs 09/01/2022 and 08/30/2022. FINDINGS: 0842 hours. Patient is rotated to the left. Allowing for this, the heart size and mediastinal contours are stable with aortic atherosclerosis. There are diffuse left greater than right airspace opacities suspicious for pneumonia. There are enlarging left greater than right pleural effusions. No evidence of pneumothorax. The bones appear unchanged without acute findings. Telemetry leads overlie the chest. IMPRESSION: Worsening left greater than right airspace opacities suspicious for pneumonia, possibly on the basis of aspiration. Enlarging left greater than right pleural effusions. Electronically Signed   By: Carey BullocksWilliam  Veazey M.D.   On: 10/16/2022 08:57    Microbiology: Results for orders placed or performed in visit on 02/18/22  Urine Culture     Status: None   Collection Time: 02/19/22 12:00 AM   Specimen: Urine   Urine  Result Value Ref Range Status   Urine Culture, Routine Final report  Final   Organism ID, Bacteria Comment  Final    Comment: Mixed urogenital flora Greater than 100,000 colony forming units per mL    ORGANISM ID, BACTERIA Not applicable  Final    Labs: CBC: No results for input(s): "WBC", "NEUTROABS", "HGB", "HCT", "MCV", "PLT" in the last 168 hours. Basic Metabolic Panel: No results for input(s): "NA", "K", "CL", "CO2", "GLUCOSE", "BUN",  "CREATININE", "CALCIUM", "MG", "PHOS" in the last 168 hours. Liver Function Tests: No results for input(s): "AST", "ALT", "ALKPHOS", "BILITOT", "PROT", "ALBUMIN" in the last 168 hours. CBG: No results for input(s): "GLUCAP" in the last 168 hours.  Discharge time spent: less than 30 minutes.  Signed: Floydene FlockSteven J Nastasia Kage, MD [facility] 11/13/2022

## 2022-12-21 ENCOUNTER — Telehealth: Payer: Medicare PPO
